# Patient Record
Sex: Female | Born: 1950
Health system: Southern US, Community
[De-identification: ages and names within clinical notes are randomized; demographics above are authoritative.]

## PROBLEM LIST (undated history)

## (undated) DIAGNOSIS — IMO0002 Reserved for concepts with insufficient information to code with codable children: Secondary | ICD-10-CM

## (undated) DIAGNOSIS — J449 Chronic obstructive pulmonary disease, unspecified: Secondary | ICD-10-CM

## (undated) DIAGNOSIS — R51 Headache: Secondary | ICD-10-CM

## (undated) DIAGNOSIS — Z8612 Personal history of poliomyelitis: Secondary | ICD-10-CM

## (undated) DIAGNOSIS — Z923 Personal history of irradiation: Secondary | ICD-10-CM

## (undated) DIAGNOSIS — M8580 Other specified disorders of bone density and structure, unspecified site: Secondary | ICD-10-CM

## (undated) DIAGNOSIS — M1990 Unspecified osteoarthritis, unspecified site: Secondary | ICD-10-CM

## (undated) DIAGNOSIS — F1090 Alcohol use, unspecified, uncomplicated: Secondary | ICD-10-CM

## (undated) DIAGNOSIS — C801 Malignant (primary) neoplasm, unspecified: Secondary | ICD-10-CM

## (undated) DIAGNOSIS — A809 Acute poliomyelitis, unspecified: Secondary | ICD-10-CM

## (undated) DIAGNOSIS — Z789 Other specified health status: Secondary | ICD-10-CM

## (undated) HISTORY — DX: Personal history of poliomyelitis: Z86.12

## (undated) HISTORY — PX: TONSILLECTOMY: SUR1361

## (undated) HISTORY — DX: Alcohol use, unspecified, uncomplicated: F10.90

## (undated) HISTORY — DX: Headache: R51

## (undated) HISTORY — DX: Other specified disorders of bone density and structure, unspecified site: M85.80

## (undated) HISTORY — DX: Acute poliomyelitis, unspecified: A80.9

## (undated) HISTORY — DX: Reserved for concepts with insufficient information to code with codable children: IMO0002

## (undated) HISTORY — DX: Unspecified osteoarthritis, unspecified site: M19.90

## (undated) HISTORY — PX: APPENDECTOMY: SHX54

## (undated) HISTORY — DX: Other specified health status: Z78.9

## (undated) HISTORY — DX: Chronic obstructive pulmonary disease, unspecified: J44.9

## (undated) HISTORY — PX: FOOT SURGERY: SHX648

## (undated) HISTORY — PX: OTHER SURGICAL HISTORY: SHX169

## (undated) HISTORY — PX: DILATION AND CURETTAGE OF UTERUS: SHX78

## (undated) HISTORY — PX: CATARACT EXTRACTION: SUR2

---

## 2001-07-30 ENCOUNTER — Encounter: Admission: RE | Admit: 2001-07-30 | Discharge: 2001-07-30 | Payer: Self-pay | Admitting: *Deleted

## 2002-05-31 ENCOUNTER — Encounter (INDEPENDENT_AMBULATORY_CARE_PROVIDER_SITE_OTHER): Payer: Self-pay | Admitting: *Deleted

## 2002-05-31 ENCOUNTER — Ambulatory Visit (HOSPITAL_COMMUNITY): Admission: RE | Admit: 2002-05-31 | Discharge: 2002-05-31 | Payer: Self-pay | Admitting: *Deleted

## 2002-05-31 LAB — HM COLONOSCOPY

## 2005-10-13 ENCOUNTER — Ambulatory Visit: Payer: Self-pay | Admitting: Family Medicine

## 2005-11-01 ENCOUNTER — Ambulatory Visit: Payer: Self-pay | Admitting: Family Medicine

## 2005-11-08 ENCOUNTER — Ambulatory Visit: Payer: Self-pay | Admitting: Family Medicine

## 2007-01-10 ENCOUNTER — Ambulatory Visit: Payer: Self-pay | Admitting: Family Medicine

## 2007-01-10 LAB — CONVERTED CEMR LAB
ALT: 15 units/L (ref 0–40)
AST: 20 units/L (ref 0–37)
BUN: 14 mg/dL (ref 6–23)
Basophils Absolute: 0 10*3/uL (ref 0.0–0.1)
Basophils Relative: 0.4 % (ref 0.0–1.0)
CO2: 29 meq/L (ref 19–32)
Calcium: 9.4 mg/dL (ref 8.4–10.5)
Chloride: 107 meq/L (ref 96–112)
Cholesterol: 200 mg/dL (ref 0–200)
Creatinine, Ser: 0.8 mg/dL (ref 0.4–1.2)
Direct LDL: 127 mg/dL
Eosinophils Absolute: 0.1 10*3/uL (ref 0.0–0.6)
Eosinophils Relative: 1.5 % (ref 0.0–5.0)
GFR calc Af Amer: 95 mL/min
GFR calc non Af Amer: 79 mL/min
Glucose, Bld: 88 mg/dL (ref 70–99)
HCT: 40.4 % (ref 36.0–46.0)
HDL: 43.5 mg/dL (ref >39.0)
Hemoglobin: 14.4 g/dL (ref 12.0–15.0)
Lymphocytes Relative: 35.9 % (ref 12.0–46.0)
MCHC: 35.6 g/dL (ref 30.0–36.0)
MCV: 98.6 fL (ref 78.0–100.0)
Monocytes Absolute: 0.6 10*3/uL (ref 0.2–0.7)
Monocytes Relative: 9.9 % (ref 3.0–11.0)
Neutro Abs: 3 10*3/uL (ref 1.4–7.7)
Neutrophils Relative %: 52.3 % (ref 43.0–77.0)
Platelets: 245 10*3/uL (ref 150–400)
Potassium: 5.3 meq/L — ABNORMAL HIGH (ref 3.5–5.1)
RBC: 4.1 M/uL (ref 3.87–5.11)
RDW: 12.5 % (ref 11.5–14.6)
Sodium: 141 meq/L (ref 135–145)
TSH: 0.63 microintl units/mL (ref 0.35–5.50)
Total CHOL/HDL Ratio: 4.6
Triglycerides: 209 mg/dL (ref 0–149)
VLDL: 42 mg/dL — ABNORMAL HIGH (ref 0–40)
WBC: 5.7 10*3/uL (ref 4.5–10.5)

## 2007-03-08 ENCOUNTER — Ambulatory Visit: Payer: Self-pay | Admitting: Internal Medicine

## 2007-07-25 DIAGNOSIS — R519 Headache, unspecified: Secondary | ICD-10-CM | POA: Insufficient documentation

## 2007-07-25 DIAGNOSIS — R51 Headache: Secondary | ICD-10-CM | POA: Insufficient documentation

## 2007-07-25 DIAGNOSIS — J449 Chronic obstructive pulmonary disease, unspecified: Secondary | ICD-10-CM | POA: Insufficient documentation

## 2007-07-25 HISTORY — DX: Chronic obstructive pulmonary disease, unspecified: J44.9

## 2007-09-20 ENCOUNTER — Telehealth: Payer: Self-pay | Admitting: Family Medicine

## 2007-09-26 ENCOUNTER — Ambulatory Visit: Payer: Self-pay | Admitting: Family Medicine

## 2007-10-09 ENCOUNTER — Ambulatory Visit: Payer: Self-pay | Admitting: Family Medicine

## 2007-10-09 LAB — CONVERTED CEMR LAB
Glucose, Urine, Semiquant: 100
Nitrite: NEGATIVE
Protein, U semiquant: NEGATIVE
Specific Gravity, Urine: 1.02
Urobilinogen, UA: 0.2
WBC Urine, dipstick: NEGATIVE
pH: 6.5

## 2007-10-16 ENCOUNTER — Ambulatory Visit: Payer: Self-pay | Admitting: Family Medicine

## 2007-10-16 DIAGNOSIS — F411 Generalized anxiety disorder: Secondary | ICD-10-CM

## 2007-10-16 DIAGNOSIS — M129 Arthropathy, unspecified: Secondary | ICD-10-CM | POA: Insufficient documentation

## 2007-10-16 HISTORY — DX: Generalized anxiety disorder: F41.1

## 2007-10-18 ENCOUNTER — Encounter: Admission: RE | Admit: 2007-10-18 | Discharge: 2007-10-18 | Payer: Self-pay | Admitting: Family Medicine

## 2007-10-19 ENCOUNTER — Encounter: Admission: RE | Admit: 2007-10-19 | Discharge: 2007-10-19 | Payer: Self-pay | Admitting: Family Medicine

## 2007-10-19 ENCOUNTER — Encounter: Payer: Self-pay | Admitting: Family Medicine

## 2007-10-25 ENCOUNTER — Encounter: Payer: Self-pay | Admitting: Family Medicine

## 2007-10-25 LAB — CONVERTED CEMR LAB
ALT: 15 units/L (ref 0–35)
AST: 22 units/L (ref 0–37)
Albumin: 4.3 g/dL (ref 3.5–5.2)
Alkaline Phosphatase: 74 units/L (ref 39–117)
BUN: 12 mg/dL (ref 6–23)
Basophils Absolute: 0 10*3/uL (ref 0.0–0.1)
Basophils Relative: 0.5 % (ref 0.0–1.0)
Bilirubin, Direct: 0.2 mg/dL (ref 0.0–0.3)
CO2: 26 meq/L (ref 19–32)
Calcium: 9.2 mg/dL (ref 8.4–10.5)
Chloride: 106 meq/L (ref 96–112)
Cholesterol: 215 mg/dL (ref 0–200)
Creatinine, Ser: 0.7 mg/dL (ref 0.4–1.2)
Direct LDL: 145.1 mg/dL
Eosinophils Absolute: 0.1 10*3/uL (ref 0.0–0.6)
Eosinophils Relative: 1.1 % (ref 0.0–5.0)
GFR calc Af Amer: 111 mL/min
GFR calc non Af Amer: 92 mL/min
Glucose, Bld: 80 mg/dL (ref 70–99)
HCT: 39.5 % (ref 36.0–46.0)
HDL: 46.5 mg/dL (ref >39.0)
Hemoglobin: 14.1 g/dL (ref 12.0–15.0)
Lymphocytes Relative: 30 % (ref 12.0–46.0)
MCHC: 35.6 g/dL (ref 30.0–36.0)
MCV: 98.8 fL (ref 78.0–100.0)
Monocytes Absolute: 0.5 10*3/uL (ref 0.2–0.7)
Monocytes Relative: 7.7 % (ref 3.0–11.0)
Neutro Abs: 3.9 10*3/uL (ref 1.4–7.7)
Neutrophils Relative %: 60.7 % (ref 43.0–77.0)
Platelets: 269 10*3/uL (ref 150–400)
Potassium: 4.1 meq/L (ref 3.5–5.1)
RBC: 4 M/uL (ref 3.87–5.11)
RDW: 12.9 % (ref 11.5–14.6)
Sodium: 142 meq/L (ref 135–145)
TSH: 0.61 microintl units/mL (ref 0.35–5.50)
Total Bilirubin: 1.1 mg/dL (ref 0.3–1.2)
Total CHOL/HDL Ratio: 4.6
Total Protein: 6.6 g/dL (ref 6.0–8.3)
Triglycerides: 102 mg/dL (ref 0–149)
VLDL: 20 mg/dL (ref 0–40)
WBC: 6.4 10*3/uL (ref 4.5–10.5)

## 2007-10-30 ENCOUNTER — Ambulatory Visit: Payer: Self-pay | Admitting: Family Medicine

## 2008-02-26 ENCOUNTER — Ambulatory Visit: Payer: Self-pay | Admitting: Family Medicine

## 2008-03-27 ENCOUNTER — Telehealth: Payer: Self-pay | Admitting: Family Medicine

## 2008-06-10 ENCOUNTER — Telehealth: Payer: Self-pay | Admitting: Family Medicine

## 2008-06-11 ENCOUNTER — Ambulatory Visit: Payer: Self-pay | Admitting: Family Medicine

## 2008-06-11 ENCOUNTER — Telehealth: Payer: Self-pay | Admitting: Family Medicine

## 2008-07-14 ENCOUNTER — Telehealth: Payer: Self-pay | Admitting: Family Medicine

## 2008-07-15 ENCOUNTER — Ambulatory Visit: Payer: Self-pay | Admitting: Family Medicine

## 2008-07-15 DIAGNOSIS — K5732 Diverticulitis of large intestine without perforation or abscess without bleeding: Secondary | ICD-10-CM

## 2008-07-15 HISTORY — DX: Diverticulitis of large intestine without perforation or abscess without bleeding: K57.32

## 2008-07-15 LAB — CONVERTED CEMR LAB
Bilirubin Urine: NEGATIVE
Glucose, Urine, Semiquant: NEGATIVE
Ketones, urine, test strip: NEGATIVE
Nitrite: NEGATIVE
Protein, U semiquant: NEGATIVE
Specific Gravity, Urine: <1.005
Urobilinogen, UA: 0.2
WBC Urine, dipstick: NEGATIVE
pH: 6.5

## 2008-09-16 ENCOUNTER — Ambulatory Visit: Payer: Self-pay | Admitting: Family Medicine

## 2008-09-24 ENCOUNTER — Telehealth: Payer: Self-pay | Admitting: Family Medicine

## 2008-10-08 ENCOUNTER — Telehealth: Payer: Self-pay | Admitting: Family Medicine

## 2008-12-09 ENCOUNTER — Telehealth: Payer: Self-pay | Admitting: Family Medicine

## 2008-12-10 ENCOUNTER — Ambulatory Visit: Payer: Self-pay | Admitting: Family Medicine

## 2008-12-10 DIAGNOSIS — M1990 Unspecified osteoarthritis, unspecified site: Secondary | ICD-10-CM

## 2008-12-10 DIAGNOSIS — J309 Allergic rhinitis, unspecified: Secondary | ICD-10-CM

## 2008-12-10 DIAGNOSIS — M949 Disorder of cartilage, unspecified: Secondary | ICD-10-CM

## 2008-12-10 DIAGNOSIS — M899 Disorder of bone, unspecified: Secondary | ICD-10-CM | POA: Insufficient documentation

## 2008-12-10 HISTORY — DX: Allergic rhinitis, unspecified: J30.9

## 2008-12-10 HISTORY — DX: Unspecified osteoarthritis, unspecified site: M19.90

## 2009-03-10 ENCOUNTER — Telehealth: Payer: Self-pay | Admitting: Family Medicine

## 2009-04-07 ENCOUNTER — Telehealth: Payer: Self-pay | Admitting: Family Medicine

## 2009-06-05 ENCOUNTER — Telehealth: Payer: Self-pay | Admitting: Family Medicine

## 2009-06-10 ENCOUNTER — Ambulatory Visit: Payer: Self-pay | Admitting: Family Medicine

## 2009-06-10 DIAGNOSIS — M19079 Primary osteoarthritis, unspecified ankle and foot: Secondary | ICD-10-CM | POA: Insufficient documentation

## 2009-06-10 HISTORY — DX: Primary osteoarthritis, unspecified ankle and foot: M19.079

## 2009-08-06 ENCOUNTER — Telehealth: Payer: Self-pay | Admitting: Family Medicine

## 2009-08-06 ENCOUNTER — Telehealth: Payer: Self-pay | Admitting: *Deleted

## 2009-09-02 ENCOUNTER — Ambulatory Visit: Payer: Self-pay | Admitting: Family Medicine

## 2009-10-13 ENCOUNTER — Telehealth: Payer: Self-pay | Admitting: Family Medicine

## 2009-10-27 ENCOUNTER — Telehealth: Payer: Self-pay | Admitting: Family Medicine

## 2010-03-02 ENCOUNTER — Ambulatory Visit: Payer: Self-pay | Admitting: Family Medicine

## 2010-03-09 ENCOUNTER — Ambulatory Visit: Payer: Self-pay | Admitting: Family Medicine

## 2010-03-09 DIAGNOSIS — K589 Irritable bowel syndrome without diarrhea: Secondary | ICD-10-CM

## 2010-03-09 HISTORY — DX: Irritable bowel syndrome, unspecified: K58.9

## 2010-03-09 LAB — CONVERTED CEMR LAB
ALT: 15 units/L (ref 0–35)
AST: 21 units/L (ref 0–37)
Albumin: 4.1 g/dL (ref 3.5–5.2)
Alkaline Phosphatase: 58 units/L (ref 39–117)
BUN: 14 mg/dL (ref 6–23)
Basophils Absolute: 0 10*3/uL (ref 0.0–0.1)
Basophils Relative: 0.3 % (ref 0.0–3.0)
Bilirubin Urine: NEGATIVE
Bilirubin, Direct: 0 mg/dL (ref 0.0–0.3)
Blood in Urine, dipstick: NEGATIVE
CO2: 29 meq/L (ref 19–32)
Calcium: 9.5 mg/dL (ref 8.4–10.5)
Chloride: 107 meq/L (ref 96–112)
Cholesterol: 209 mg/dL — ABNORMAL HIGH (ref 0–200)
Creatinine, Ser: 0.7 mg/dL (ref 0.4–1.2)
Direct LDL: 151.4 mg/dL
Eosinophils Absolute: 0.1 10*3/uL (ref 0.0–0.7)
Eosinophils Relative: 1.5 % (ref 0.0–5.0)
GFR calc non Af Amer: 90.84 mL/min (ref >60)
Glucose, Bld: 85 mg/dL (ref 70–99)
Glucose, Urine, Semiquant: NEGATIVE
HCT: 41.3 % (ref 36.0–46.0)
HDL: 42.6 mg/dL (ref >39.00)
Hemoglobin: 14.2 g/dL (ref 12.0–15.0)
Ketones, urine, test strip: NEGATIVE
Lymphocytes Relative: 36 % (ref 12.0–46.0)
Lymphs Abs: 1.8 10*3/uL (ref 0.7–4.0)
MCHC: 34.2 g/dL (ref 30.0–36.0)
MCV: 101.7 fL — ABNORMAL HIGH (ref 78.0–100.0)
Monocytes Absolute: 0.4 10*3/uL (ref 0.1–1.0)
Monocytes Relative: 7.6 % (ref 3.0–12.0)
Neutro Abs: 2.7 10*3/uL (ref 1.4–7.7)
Neutrophils Relative %: 54.6 % (ref 43.0–77.0)
Nitrite: NEGATIVE
Platelets: 251 10*3/uL (ref 150.0–400.0)
Potassium: 4.3 meq/L (ref 3.5–5.1)
Protein, U semiquant: NEGATIVE
RBC: 4.06 M/uL (ref 3.87–5.11)
RDW: 12.7 % (ref 11.5–14.6)
Sodium: 145 meq/L (ref 135–145)
Specific Gravity, Urine: 1.02
TSH: 0.71 microintl units/mL (ref 0.35–5.50)
Total Bilirubin: 0.7 mg/dL (ref 0.3–1.2)
Total CHOL/HDL Ratio: 5
Total Protein: 7 g/dL (ref 6.0–8.3)
Triglycerides: 119 mg/dL (ref 0.0–149.0)
Urobilinogen, UA: 0.2
VLDL: 23.8 mg/dL (ref 0.0–40.0)
WBC: 5 10*3/uL (ref 4.5–10.5)
pH: 5.5

## 2010-03-17 ENCOUNTER — Telehealth: Payer: Self-pay | Admitting: Family Medicine

## 2010-03-17 ENCOUNTER — Ambulatory Visit: Payer: Self-pay | Admitting: Family Medicine

## 2010-03-24 LAB — CONVERTED CEMR LAB: OCCULT 2: NEGATIVE

## 2010-04-16 ENCOUNTER — Ambulatory Visit: Payer: Self-pay | Admitting: Gastroenterology

## 2010-04-16 ENCOUNTER — Encounter (INDEPENDENT_AMBULATORY_CARE_PROVIDER_SITE_OTHER): Payer: Self-pay | Admitting: *Deleted

## 2010-04-16 DIAGNOSIS — R1032 Left lower quadrant pain: Secondary | ICD-10-CM | POA: Insufficient documentation

## 2010-06-29 ENCOUNTER — Ambulatory Visit: Payer: Self-pay | Admitting: Family Medicine

## 2010-06-29 DIAGNOSIS — Z8612 Personal history of poliomyelitis: Secondary | ICD-10-CM

## 2010-06-29 DIAGNOSIS — R059 Cough, unspecified: Secondary | ICD-10-CM | POA: Insufficient documentation

## 2010-06-29 DIAGNOSIS — I10 Essential (primary) hypertension: Secondary | ICD-10-CM | POA: Insufficient documentation

## 2010-06-29 DIAGNOSIS — M62838 Other muscle spasm: Secondary | ICD-10-CM | POA: Insufficient documentation

## 2010-06-29 DIAGNOSIS — R05 Cough: Secondary | ICD-10-CM

## 2010-06-29 DIAGNOSIS — F17200 Nicotine dependence, unspecified, uncomplicated: Secondary | ICD-10-CM

## 2010-06-29 HISTORY — DX: Other muscle spasm: M62.838

## 2010-06-29 HISTORY — DX: Nicotine dependence, unspecified, uncomplicated: F17.200

## 2010-09-20 ENCOUNTER — Telehealth: Payer: Self-pay | Admitting: Family Medicine

## 2010-09-21 ENCOUNTER — Telehealth: Payer: Self-pay | Admitting: Family Medicine

## 2010-09-30 ENCOUNTER — Ambulatory Visit: Payer: Self-pay | Admitting: Family Medicine

## 2010-11-16 ENCOUNTER — Telehealth: Payer: Self-pay | Admitting: Family Medicine

## 2010-12-05 HISTORY — PX: RETINAL DETACHMENT SURGERY: SHX105

## 2011-01-06 NOTE — Procedures (Signed)
Summary: Colon   Colonoscopy  Procedure date:  05/31/2002  Findings:      Location:  [facility].   [facility]  Patient:    Lindsey Rowland, ZURCHER Visit Number: [id number] MRN: [id number]          Service Type: END Location: ENDO Attending Physician:  Sabino Gasser Dictated by:   Sabino Gasser, M.D. Proc. Date: 05/31/02 Admit Date:  05/31/2002   CC:         Tinnie Gens C. Quintella Reichert, M.D.   Procedure Report  PROCEDURE:  Colonoscopy.  INDICATIONS:  Colon cancer screening.  Rectal bleeding attributed to hemorrhoids.  ANESTHESIA:  Demerol 20, Versed 2 mg.  DESCRIPTION OF PROCEDURE:  With the patient mildly sedated in the left lateral decubitus position, a rectal examination was performed.  Subsequently, the Olympus videoscopic colonoscope was inserted into the rectum and passed under direct vision to the cecum, identified by the ileocecal valve and appendiceal orifice, both of which were photographed.  From this point, the colonoscope was slowly withdrawn, taking circumferential views of the entire colonic mucosa, stopping in the rectum which appeared normal on direct and showed hemorrhoids on retroflexed view.  The endoscope was straightened and withdrawn.  The patients vital signs and pulse oximeter remained stable.  The patient tolerated the procedure well without apparent complications.  FINDINGS:  Internal hemorrhoids, otherwise unremarkable colonoscopic examination to the cecum.  PLAN:  Will have patient follow up with me as an outpatient. Dictated by:   Sabino Gasser, M.D. Attending Physician:  Sabino Gasser DD:  05/31/02 TD:  06/01/02 Job: [id number]

## 2011-01-06 NOTE — Progress Notes (Signed)
Summary: colon & endo  Phone Note Call from Patient Call back at [phone]   Caller: vm Summary of Call: Info re last test colon & endo that Dr. Scotty Court wanted.  [facility].   Called [facility].  Neither had record of colon.  Another vm call.  Raelene Bott Spell, RN  March 17, 2010 4:25 PM She knows it'b been 8-10 years since last tests and she needs to get them scheduled.  Left side gas & pain.  Painful today on the med that Dr. Satira Sark gave her.  Last time she also had endo for acid reflux, taking Protonix for it now, so doing good now.    Initial call taken by: Rudy Jew, RN,  March 17, 2010 3:20 PM  Follow-up for Phone Call        arrange an appointment with gastroenterologist

## 2011-01-06 NOTE — Assessment & Plan Note (Signed)
Summary: cpx//ccm   Vital Signs:  Patient profile:   60 year old female Height:      65 inches Weight:      130 pounds BMI:     21.71 O2 Sat:      97 % Temp:     98.5 degrees F Pulse rate:   94 / minute BP sitting:   124 / 76  (left arm)  Vitals Entered By: Pura Spice, RN (March 09, 2010 2:51 PM) CC: cpx GYN Dr Primitivo Gauze  Is Patient Diabetic? No   History of Present Illness: This 60 year old white married female is in for complete physical examination, no Pap smear patient has gynecologist Dr. Primitivo Gauze to his lower knee down a mammogram Pap smear and bone density Her complaints today are that she is tired with no energy and hasn't been for some time She complains of pain in the left lower quadrant with increased gas and bloating has bouts of diarrhea no constipation and no bleeding Has not had a colonoscopic examination in 8-10 years and I will definitely schedule this for this patient. She has past history of GERD which has been well-controlled with proton at 40 mg each day She has chronic anxiety and stress control with diazepam 10 for anxiety. Patient had back injuries in the past and needs hydrocodone since Celebrex 200 mg b.i.d. does not control her back pain and arthritis Her COPD is controlled with Combivent  She is on Boniva calcium and vitamin D for osteopenia had cataract removal right eye continues to have some problem with allergic rhinitis taken antihistamines  Preventive Screening-Counseling & Management  Alcohol-Tobacco     Smoking Status: current     Packs/Day: 0.5     Year Started: 2006  Allergies (verified): No Known Drug Allergies  Past History:  Past Medical History: Last updated: 07/25/2007 Arthritis Migraine Hx of polio COPD Headache Bronchitis Ulcers  Past Surgical History: Last updated: 07/25/2007 T/A Appendectomy Tonsillectomy  Social History: Last updated: 07/25/2007 Occupation: Chartered loss adjuster Married Current Smoker Alcohol  use-yes Drug use-no Regular exercise-no  Risk Factors: Smoking Status: current (03/09/2010) Packs/Day: 0.5 (03/09/2010)  Social History: Packs/Day:  0.5  Review of Systems      See HPI  The patient denies anorexia, fever, weight loss, weight gain, vision loss, decreased hearing, hoarseness, chest pain, syncope, dyspnea on exertion, peripheral edema, prolonged cough, headaches, hemoptysis, abdominal pain, melena, hematochezia, severe indigestion/heartburn, hematuria, incontinence, genital sores, muscle weakness, suspicious skin lesions, transient blindness, difficulty walking, depression, unusual weight change, abnormal bleeding, enlarged lymph nodes, angioedema, breast masses, and testicular masses.    Physical Exam  General:  Well-developed,well-nourished,in no acute distress; alert,appropriate and cooperative throughout examination Head:  Normocephalic and atraumatic without obvious abnormalities. No apparent alopecia or balding. Eyes:  No corneal or conjunctival inflammation noted. EOMI. Perrla. Funduscopic exam benign, without hemorrhages, exudates or papilledema. Vision grossly normal. Ears:  External ear exam shows no significant lesions or deformities.  Otoscopic examination reveals clear canals, tympanic membranes are intact bilaterally without bulging, retraction, inflammation or discharge. Hearing is grossly normal bilaterally. Nose:  nasal mucosa boggy and pale with minimal clear drainage Mouth:  Oral mucosa and oropharynx without lesions or exudates.  Teeth in good repair. Neck:  No deformities, masses, or tenderness noted. Chest Wall:  No deformities, masses, or tenderness noted. Breasts:  No mass, nodules, thickening, tenderness, bulging, retraction, inflamation, nipple discharge or skin changes noted.   Lungs:  Normal respiratory effort, chest expands symmetrically. Lungs are clear  to auscultation, no crackles or wheezes. Heart:  Normal rate and regular rhythm. S1 and S2  normal without gallop, murmur, click, rub or other extra sounds. Abdomen:  Bowel sounds positive,abdomen soft and non-tender without masses, organomegaly or hernias noted. Rectal:  not examined Genitalia:  not examined Msk:  No deformity or scoliosis noted of thoracic or lumbar spine.   Pulses:  R and L carotid,radial,femoral,dorsalis pedis and posterior tibial pulses are full and equal bilaterally Extremities:  No clubbing, cyanosis, edema, or deformity noted with normal full range of motion of all joints.   Neurologic:  No cranial nerve deficits noted. Station and gait are normal. Plantar reflexes are down-going bilaterally. DTRs are symmetrical throughout. Sensory, motor and coordinative functions appear intact. Skin:  Intact without suspicious lesions or rashes Cervical Nodes:  No lymphadenopathy noted Axillary Nodes:  No palpable lymphadenopathy Inguinal Nodes:  No significant adenopathy Psych:  Cognition and judgment appear intact. Alert and cooperative with normal attention span and concentration. No apparent delusions, illusions, hallucinations   Impression & Recommendations:  Problem # 1:  PHYSICAL EXAMINATION (ICD-V70.0) Assessment New  Problem # 2:  IRRITABLE BOWEL SYNDROME (ICD-564.1) Assessment: New Hyosyamine ,375 mg two times a day 2 schedule colonoscopic exam  Problem # 3:  ARTHRITIS, ANKLE (ICD-716.97) Assessment: Unchanged  Problem # 4:  OSTEOPENIA (ICD-733.90) Assessment: Unchanged  Her updated medication list for this problem includes:    Boniva 150 Mg Tabs (Ibandronate sodium) .Marland Kitchen... Take 1 tablet monthly  Problem # 5:  ALLERGIC RHINITIS (ICD-477.9) Assessment: Unchanged  Her updated medication list for this problem includes:    Omnaris 50 Mcg/act Susp (Ciclesonide) .Marland Kitchen... 2 sprays each nostril qd  Problem # 6:  ANXIETY STATE, UNSPECIFIED (ICD-300.00) Assessment: Unchanged  The following medications were removed from the medication list:    Hydroxyzine  Hcl 25 Mg Tabs (Hydroxyzine hcl) .Marland Kitchen... 1 three times a day to prevent itching Her updated medication list for this problem includes:    Diazepam 10 Mg Tabs (Diazepam) .Marland Kitchen... 1  three times a day as needed for stress  Problem # 7:  ARTHRITIS (ICD-716.90) Assessment: Unchanged  Problem # 8:  COPD (ICD-496) Assessment: Unchanged  Her updated medication list for this problem includes:    Combivent 103-18 Mcg/act Aero (Albuterol-ipratropium) .Marland Kitchen... 2 inhalation as needed for wheezing    Proair Hfa 108 (90 Base) Mcg/act Aers (Albuterol sulfate) .Marland Kitchen... 2 inhalations three times a day as needed wheezing  Complete Medication List: 1)  Protonix 40 Mg Tbec (Pantoprazole sodium) .... Once daily 2)  Diazepam 10 Mg Tabs (Diazepam) .Marland Kitchen.. 1  three times a day as needed for stress 3)  Hydrocodone-acetaminophen 10-500 Mg Tabs (Hydrocodone-acetaminophen) .... Take 1 every 4-6 hrs as needed pain. not to exceed 4 per day 4)  Combivent 103-18 Mcg/act Aero (Albuterol-ipratropium) .... 2 inhalation as needed for wheezing 5)  Diphenoxylate-atropine 2.5-0.025 Mg Tabs (Diphenoxylate-atropine) .... Take 2 tablets stat then  2 tablets qid for diarrhea 6)  Boniva 150 Mg Tabs (Ibandronate sodium) .... Take 1 tablet monthly 7)  Proair Hfa 108 (90 Base) Mcg/act Aers (Albuterol sulfate) .... 2 inhalations three times a day as needed wheezing 8)  Meclizine Hcl 25 Mg Tabs (Meclizine hcl) .Marland Kitchen.. 1 stat then morn,midafternoon and hs to prevent dizziness 9)  Omnaris 50 Mcg/act Susp (Ciclesonide) .... 2 sprays each nostril qd 10)  Mucinex D [phone] Mg Xr12h-tab (Pseudoephedrine-guaifenesin) .Marland Kitchen.. 1 two times a day 11)  Celebrex 200 Mg Caps (Celecoxib) .Marland Kitchen.. 1 by mouth two times a  day after meals  needs office visit 12)  Hyoscyamine Sulfate Cr 0.375 Mg Xr12h-tab (Hyoscyamine sulfate) .Marland Kitchen.. 1 each morning for irritable bowel, may increase to 1 two times a day if needed  Other Orders: EKG w/ Interpretation (93000) Gastroenterology  Referral (GI)  Patient Instructions: 1)  Take medications as prescribedpreviously 2)  We'll schedule colonoscopic exam in the very near future Prescriptions: HYDROCODONE-ACETAMINOPHEN 10-500 MG  TABS (HYDROCODONE-ACETAMINOPHEN) take 1 every 4-6 hrs as needed pain. not to exceed 4 per day  #120 x 5   Entered and Authorized by:   Judithann Sheen MD   Signed by:   Judithann Sheen MD on 03/09/2010   Method used:   Print then Give to Patient   RxID:   [id number] DIAZEPAM 10 MG  TABS (DIAZEPAM) 1  three times a day as needed for stress  #90 x 5   Entered and Authorized by:   Judithann Sheen MD   Signed by:   Judithann Sheen MD on 03/09/2010   Method used:   Print then Give to Patient   RxID:   [id number] CELEBREX 200 MG CAPS (CELECOXIB) 1 by mouth two times a day after meals  needs office visit  #60 x 11   Entered and Authorized by:   Judithann Sheen MD   Signed by:   Judithann Sheen MD on 03/09/2010   Method used:   [facility]        [facility] [address] [phone][facility] (retail)       [address]       [place], [place]  [address]       Ph: [phone] or [phone]       Fax: [phone]   RxID:   [id number] MECLIZINE HCL 25 MG TABS (MECLIZINE HCL) 1 stat then morn,midafternoon and hs to prevent dizziness  #90 x 11   Entered and Authorized by:   Judithann Sheen MD   Signed by:   Judithann Sheen MD on 03/09/2010   Method used:   [facility]        [facility]  [address] [address] [phone]* (retail)       [address]       [place], [place]  [address]       Ph: [phone] or [phone]       Fax: [phone]   RxID:   [id number] COMBIVENT 103-18 MCG/ACT  AERO (ALBUTEROL-IPRATROPIUM) 2 inhalation as needed for wheezing  #1 x 11   Entered and Authorized by:   Judithann Sheen MD   Signed by:   Judithann Sheen MD on 03/09/2010   Method used:   [facility]        [facility]  [address]  [phone]* (retail)       [address]       [place], [place]  [address]       Ph: [phone] or [phone]       Fax: [phone]   RxID:   [id number] PROTONIX 40 MG TBEC (PANTOPRAZOLE SODIUM) once daily Brand medically necessary #30 x 11   Entered and Authorized by:   Judithann Sheen MD   Signed by:   Judithann Sheen MD on 03/09/2010   Method used:  [facility]        [facility]  [address] [phone]* (retail)       [address]       [place], [place]  [address]       Ph: [phone] or [phone]       Fax: [phone]   RxID:   [id number] HYOSCYAMINE SULFATE CR 0.375 MG XR12H-TAB (HYOSCYAMINE SULFATE) 1 each morning for irritable bowel, may increase to 1 two times a day if needed  #30 x 11   Entered and Authorized by:   Judithann Sheen MD   Signed by:   Judithann Sheen MD on 03/09/2010   Method used:   [facility]        [facility]  [address] [phone]* (retail)       [address]       [place], [place]  [address]       Ph: [phone] or [phone]       Fax: [phone]   RxID:   [id number]

## 2011-01-06 NOTE — Assessment & Plan Note (Signed)
History of Present Illness Visit Type: Initial Consult Primary GI MD: Rob Bunting MD Primary Provider: London Sheer Requesting Provider: London Sheer Chief Complaint: Lower abdominal Pain History of Present Illness:     very pleasant 60 year old woman who has had left sided abd pains.  These have been going on for several months.  Eating makes her bloated and this causes worseing symptoms. She is sore on left side. this is intermittent.  3 weeks ago she was very distended. She's been belching a lot lately. feels a lot of churning. she can push on her left side when she has this feeling and then often passes gas. Moving her bowels usually results are discomforts.  She had recent CBC, complete metabolic profile and these were normal.  She is a bit more consipated lately.  Has had to push, strain a bit.  She takes hydrocodone 3-4 times a week.  Never black colored, never overt red blood.  She had  colonosopy many years ago (7-10 years ago, didn't find anything).           Current Medications (verified): 1)  Protonix 40 Mg Tbec (Pantoprazole Sodium) .... Once Daily 2)  Diazepam 10 Mg  Tabs (Diazepam) .Marland Kitchen.. 1  Three Times A Day As Needed For Stress 3)  Hydrocodone-Acetaminophen 10-500 Mg  Tabs (Hydrocodone-Acetaminophen) .... Take 1 Every 4-6 Hrs As Needed Pain. Not To Exceed 4 Per Day 4)  Combivent 103-18 Mcg/act  Aero (Albuterol-Ipratropium) .... 2 Inhalation As Needed For Wheezing 5)  Diphenoxylate-Atropine 2.5-0.025 Mg  Tabs (Diphenoxylate-Atropine) .... Take 2 Tablets Stat Then  2 Tablets Qid For Diarrhea 6)  Boniva 150 Mg  Tabs (Ibandronate Sodium) .... Take 1 Tablet Monthly 7)  Proair Hfa 108 (90 Base) Mcg/act  Aers (Albuterol Sulfate) .... 2 Inhalations Three Times A Day As Needed Wheezing 8)  Meclizine Hcl 25 Mg Tabs (Meclizine Hcl) .Marland Kitchen.. 1 Stat Then Morn,midafternoon and Hs To Prevent Dizziness 9)  Omnaris 50 Mcg/act Susp (Ciclesonide) .... 2 Sprays Each  Nostril Qd 10)  Mucinex D [phone] Mg Xr12h-Tab (Pseudoephedrine-Guaifenesin) .Marland Kitchen.. 1 Two Times A Day 11)  Celebrex 200 Mg Caps (Celecoxib) .Marland Kitchen.. 1 By Mouth Two Times A Day After Meals  Needs Office Visit 12)  Hyoscyamine Sulfate Cr 0.375 Mg Xr12h-Tab (Hyoscyamine Sulfate) .Marland Kitchen.. 1 Each Morning For Irritable Bowel, May Increase To 1 Two Times A Day If Needed  Allergies (verified): No Known Drug Allergies  Past History:  Past Medical History: Arthritis Migraine Hx of polio COPD Headache Bronchitis Ulcers ruptured appendicitis adhesive disease   Past Surgical History: T/A Appendectomy 2003 Tonsillectomy  Family History: no colon cancer  Social History: Occupation: Chartered loss adjuster Married Current Smoker Alcohol use-yes Drug use-no Regular exercise-no   Review of Systems       Pertinent positive and negative review of systems were noted in the above HPI and GI specific review of systems.  All other review of systems was otherwise negative.   Vital Signs:  Patient profile:   60 year old female Height:      65 inches Weight:      126 pounds BMI:     21.04 BSA:     1.63 Pulse rate:   100 / minute Pulse rhythm:   regular BP sitting:   128 / 82  (left arm)  Vitals Entered By: Merri Ray CMA Duncan Dull) (Apr 16, 2010 8:58 AM)  Physical Exam  Additional Exam:  Constitutional: generally well appearing Psychiatric: alert and oriented times 3 Eyes: extraocular movements  intact Mouth: oropharynx moist, no lesions Neck: supple, no lymphadenopathy Cardiovascular: heart regular rate and rythm Lungs: CTA bilaterally Abdomen: soft, non-tender, non-distended, no obvious ascites, no peritoneal signs, normal bowel sounds Extremities: no lower extremity edema bilaterally Skin: no lesions on visible extremities    Impression & Recommendations:  Problem # 1:  Left lower quadrant discomforts she has been a bit more constipated lately. Perhaps her constipation is causing some  gas trapping, bloating sensation. I think we should proceed with full colonoscopy to rule out neoplastic process which I doubt. In the meantime, she will start fiber supplements on a daily basis to see if we can even out her bowel habits, hopefully this in turn will resolve some of her bloating, left-sided discomforts.  Patient Instructions: 1)  You will be scheduled to have a colonoscopy in 4 weeks. 2)  You should begin taking citrucel powder fiber supplement (orange flavor).  Start with a small spoonful and increase this over 1 week to a full, heaping spoonful daily.  You may notice some bloating when you first start the fiber, but that usually resolves after a few days. 3)  A copy of this information will be sent to Dr. Scotty Court. 4)  The medication list was reviewed and reconciled.  All changed / newly prescribed medications were explained.  A complete medication list was provided to the patient / caregiver.  Appended Document: Orders Update/movi    Clinical Lists Changes  Problems: Added new problem of ABDOMINAL PAIN, LEFT LOWER QUADRANT (ICD-789.04) Medications: Added new medication of MOVIPREP 100 GM  SOLR (PEG-KCL-NACL-NASULF-NA ASC-C) As per prep instructions. - Signed Rx of MOVIPREP 100 GM  SOLR (PEG-KCL-NACL-NASULF-NA ASC-C) As per prep instructions.;  #1 x 0;  Signed;  Entered by: Chales Abrahams CMA (AAMA);  Authorized by: Rachael Fee MD;  Method used: [facility]  [address] [phone]*, [address], [place], [place], [place]  [address], Ph: [phone] or [phone], Fax: [phone] Orders: Added new Test order of Colonoscopy (Colon) - Signed    Prescriptions: MOVIPREP 100 GM  SOLR (PEG-KCL-NACL-NASULF-NA ASC-C) As per prep instructions.  #1 x 0   Entered by:   Chales Abrahams CMA (AAMA)   Authorized by:   Rachael Fee MD   Signed by:   Chales Abrahams CMA (AAMA) on 04/16/2010   Method used:   [facility]ron[facility]        [facility]  [address] [phone]* (retail)       [address]       [place], [place]  [address]       Ph: [phone] or [phone]       Fax: [phone]   RxID:   [id number]

## 2011-01-06 NOTE — Letter (Signed)
Summary: [facility] Instructions  [facility]  [address] [place], [place] [address]   Phone: [phone]  Fax: [phone]       Lindsey Rowland    Dec 05, 1951    MRN: [id number]        Procedure Day /Date:05/19/10     Arrival Time:1 pm     Procedure Time:2 pm     Location of Procedure:                    X  [facility] (4th Floor)                        PREPARATION FOR COLONOSCOPY WITH MOVIPREP   Starting 5 days prior to your procedure 05/14/10 do not eat nuts, seeds, popcorn, corn, beans, peas,  salads, or any raw vegetables.  Do not take any fiber supplements (e.g. Metamucil, Citrucel, and Benefiber).  THE DAY BEFORE YOUR PROCEDURE         DATE: 05/18/10  DAY: TUE  1.  Drink clear liquids the entire day-NO SOLID FOOD  2.  Do not drink anything colored red or purple.  Avoid juices with pulp.  No orange juice.  3.  Drink at least 64 oz. (8 glasses) of fluid/clear liquids during the day to prevent dehydration and help the prep work efficiently.  CLEAR LIQUIDS INCLUDE: Water Jello Ice Popsicles Tea (sugar ok, no milk/cream) Powdered fruit flavored drinks Coffee (sugar ok, no milk/cream) Gatorade Juice: apple, white grape, white cranberry  Lemonade Clear bullion, consomm, broth Carbonated beverages (any kind) Strained chicken noodle soup Hard Candy                             4.  In the morning, mix first dose of MoviPrep solution:    Empty 1 Pouch A and 1 Pouch B into the disposable container    Add lukewarm drinking water to the top line of the container. Mix to dissolve    Refrigerate (mixed solution should be used within 24 hrs)  5.  Begin drinking the prep at 5:00 p.m. The MoviPrep container is divided by 4 marks.   Every 15 minutes drink the solution down to the next mark (approximately 8 oz) until the full liter is complete.   6.  Follow completed prep with 16 oz of clear liquid of your choice (Nothing red or purple).   Continue to drink clear liquids until bedtime.  7.  Before going to bed, mix second dose of MoviPrep solution:    Empty 1 Pouch A and 1 Pouch B into the disposable container    Add lukewarm drinking water to the top line of the container. Mix to dissolve    Refrigerate  THE DAY OF YOUR PROCEDURE      DATE: 05/19/10 DAY: WED  Beginning at 9 a.m. (5 hours before procedure):         1. Every 15 minutes, drink the solution down to the next mark (approx 8 oz) until the full liter is complete.  2. Follow completed prep with 16 oz. of clear liquid of your choice.    3. You may drink clear liquids until 12 noon (2 HOURS BEFORE PROCEDURE).   MEDICATION INSTRUCTIONS  Unless otherwise instructed, you should take regular prescription medications with a small sip of water   as early as possible the morning of your procedure.  OTHER INSTRUCTIONS  You will need a responsible adult at least 61 years of age to accompany you and drive you home.   This person must remain in the waiting room during your procedure.  Wear loose fitting clothing that is easily removed.  Leave jewelry and other valuables at home.  However, you may wish to bring a book to read or  an iPod/MP3 player to listen to music as you wait for your procedure to start.  Remove all body piercing jewelry and leave at home.  Total time from sign-in until discharge is approximately 2-3 hours.  You should go home directly after your procedure and rest.  You can resume normal activities the  day after your procedure.  The day of your procedure you should not:   Drive   Make legal decisions   Operate machinery   Drink alcohol   Return to work  You will receive specific instructions about eating, activities and medications before you leave.    The above instructions have been reviewed and explained to me by   _______________________    I fully understand and can verbalize these instructions  _____________________________ Date _________

## 2011-01-06 NOTE — Assessment & Plan Note (Signed)
Summary: flu shot/cjr  Nurse Visit   Allergies: No Known Drug Allergies  Review of Systems       Flu Vaccine Consent Questions     Do you have a history of severe allergic reactions to this vaccine? no    Any prior history of allergic reactions to egg and/or gelatin? no    Do you have a sensitivity to the preservative Thimersol? no    Do you have a past history of Guillan-Barre Syndrome? no    Do you currently have an acute febrile illness? no    Have you ever had a severe reaction to latex? no    Vaccine information given and explained to patient? yes    Are you currently pregnant? no    Lot Number:[id number]   Exp Date:06/04/2011   Site Given  Left Deltoid IM    Orders Added: 1)  Admin 1st Vaccine [90471] 2)  Flu Vaccine 3yrs + [90658] 

## 2011-01-06 NOTE — Progress Notes (Signed)
Summary: Hydrocodone refill  Phone Note Refill Request Message from:  Fax from Pharmacy on September 20, 2010 4:48 PM  Refills Requested: Medication #1:  HYDROCODONE-ACETAMINOPHEN 10-500 MG  TABS take 1 every 4-6 hrs as needed pain. not to exceed 4 per day   Dosage confirmed as above?Dosage Confirmed Please advise refill?  Initial call taken by: Josph Macho RMA,  September 20, 2010 4:48 PM  Follow-up for Phone Call        OK to refill with same sig, 120 and 1 rf Follow-up by: Danise Edge MD,  September 20, 2010 5:05 PM    Prescriptions: HYDROCODONE-ACETAMINOPHEN 10-500 MG  TABS (HYDROCODONE-ACETAMINOPHEN) take 1 every 4-6 hrs as needed pain. not to exceed 4 per day  #120 x 1   Entered by:   Josph Macho RMA   Authorized by:   Danise Edge MD   Signed by:   Josph Macho RMA on 09/21/2010   Method used:   Telephoned to ...       [facility]  [address] [phone]* (retail)       [address]       [place], [place]  [address]       Ph: [phone] or [phone]       Fax: [phone]   RxID:   [id number]

## 2011-01-06 NOTE — Medication Information (Signed)
Summary: Prior Authorization Request for Celebrex  Prior Authorization Request for Celebrex   Imported By: Maryln Gottron 03/22/2010 10:53:32  _____________________________________________________________________  External Attachment:    Type:   Image     Comment:   External Document

## 2011-01-06 NOTE — Progress Notes (Signed)
Summary: refill pain med   Phone Note Refill Request Message from:  Fax from Pharmacy on November 16, 2010 1:20 PM  Refills Requested: Medication #1:  HYDROCODONE-ACETAMINOPHEN 10-500 MG  TABS take 1 every 4-6 hrs as needed pain. not to exceed 4 per day [facility]    Method Requested: Fax to Local Pharmacy Initial call taken by: Duard Brady LPN,  November 16, 2010 1:21 PM    Prescriptions: HYDROCODONE-ACETAMINOPHEN 10-500 MG  TABS (HYDROCODONE-ACETAMINOPHEN) take 1 every 4-6 hrs as needed pain. not to exceed 4 per day  #120 x 3   Entered by:   Duard Brady LPN   Authorized by:   Judithann Sheen MD   Signed by:   Duard Brady LPN on 30/86/5784   Method used:   Historical   RxID:   [id number]  faxed back to [facility]

## 2011-01-06 NOTE — Progress Notes (Signed)
Summary: Diazepam refill  Phone Note Refill Request Message from:  Fax from Pharmacy on September 21, 2010 2:49 PM  Refills Requested: Medication #1:  DIAZEPAM 10 MG  TABS 1  three times a day as needed for stress   Dosage confirmed as above?Dosage Confirmed Please advise?  Initial call taken by: Josph Macho RMA,  September 21, 2010 2:50 PM  Follow-up for Phone Call        OK to give with same sig #90 with 1 rf    Prescriptions: DIAZEPAM 10 MG  TABS (DIAZEPAM) 1  three times a day as needed for stress  #90 x 1   Entered by:   Josph Macho RMA   Authorized by:   Danise Edge MD   Signed by:   Josph Macho RMA on 09/22/2010   Method used:   Telephoned to ...       [facility]  [address] [phone]* (retail)       [address]       [place], [place]  [address]       Ph: [phone] or [phone]       Fax: [phone]   RxID:   [id number]

## 2011-01-06 NOTE — Procedures (Signed)
Summary: EGD   EGD  Procedure date:  05/31/2002  Findings:      Location: [facility]                     [facility]  Patient:    Lindsey Rowland, Lindsey Rowland Visit Number: [id number] MRN: [id number]          Service Type: END Location: ENDO Attending Physician:  Sabino Gasser Dictated by:   Sabino Gasser, M.D. Proc. Date: 05/31/02 Admit Date:  05/31/2002   CC:         Tinnie Gens C. Quintella Reichert, M.D.   Procedure Report  PROCEDURE:  Upper endoscopy.  INDICATIONS:  Gastroesophageal reflux disease.  ANESTHESIA:  Demerol 50, Versed 6 mg.  DESCRIPTION OF PROCEDURE:  With the patient mildly sedated in the left lateral decubitus position, the Olympus videoscopic endoscope was inserted into the mouth and passed under direct vision through the esophagus which appeared normal.  There was no evidence of Barretts.  We entered into the stomach; fundus, body, antrum, duodenal bulb, and second portion of duodenum all appeared normal.  From this point, the endoscope was slowly withdrawn, taking circumferential views of the entire duodenal mucosa until the endoscope then pulled back into the stomach and placed in retroflexion to view the stomach from below.  The endoscope was then straightened and withdrawn, taking circumferential views of the remaining gastric and esophageal mucosa. The patients vital signs and pulse oximeter remained stable.  The patient tolerated the procedure well without apparent complications.  FINDINGS:  This is a negative endoscopic examination.  PLAN:  Continue aggressive therapy and proceed to colonoscopy as planned. Dictated by:   Sabino Gasser, M.D. Attending Physician:  Sabino Gasser DD:  05/31/02 TD:  06/01/02 Job: [id number]

## 2011-01-06 NOTE — Assessment & Plan Note (Signed)
Summary: consult re: swelling in lft ankle/cjr   Vital Signs:  Patient profile:   60 year old female Weight:      126 pounds O2 Sat:      98 % Temp:     99.1 degrees F Pulse rate:   84 / minute Pulse rhythm:   regular BP sitting:   140 / 80  Vitals Entered By: Pura Spice, RN (June 29, 2010 4:22 PM) CC: concerned about Bp Check c hest area and ck left ankle swollen   History of Present Illness: This 60 year old married female is concerned about the elevation of her blood pressure might have been normally 110-120 systolic 7 right ear diastolic however over the last 2 weeks has ranged from 1:30 to 160 systolic 90-96 distolic Patient has had anterior chest pain as well as pain over left are previous with muscle spasm and referred pain primarily from the back to the shoulder arm and anterior chest, no pain on exertion no pressure type pain no diaphoresis She complains of having had swollen left ankle and foot as well as the lower left leg has decreased over the past 2 days Patient continues to smoke and has had a persistent tach and cough over the last 2-3 weeks  Allergies (verified): No Known Drug Allergies  Past History:  Past Surgical History: Last updated: 04/16/2010 T/A Appendectomy 2003 Tonsillectomy  Social History: Last updated: 04/16/2010 Occupation: Chartered loss adjuster Married Current Smoker Alcohol use-yes Drug use-no Regular exercise-no   Risk Factors: Smoking Status: current (03/09/2010) Packs/Day: 0.5 (03/09/2010)  Past Medical History: Arthritis Migraine Hx of polio COPD Headache Bronchitis Ulcers ruptured appendicitis adhesive disease  poliomyelitis at age 61  Review of Systems      See HPI  The patient denies anorexia, fever, weight loss, weight gain, vision loss, decreased hearing, hoarseness, chest pain, syncope, dyspnea on exertion, peripheral edema, prolonged cough, headaches, hemoptysis, abdominal pain, melena, hematochezia, severe  indigestion/heartburn, hematuria, incontinence, genital sores, muscle weakness, suspicious skin lesions, transient blindness, difficulty walking, depression, unusual weight change, abnormal bleeding, enlarged lymph nodes, angioedema, breast masses, and testicular masses.    Physical Exam  General:  Well-developed,well-nourished,in no acute distress; alert,appropriate and cooperative throughout examination Neck:  No deformities, masses, or tenderness noted. Chest Wall:  on the left the second costochondral joint is enlarged compared to the other joint root no tenderness today Breasts:  No mass, nodules, thickening, tenderness, bulging, retraction, inflamation, nipple discharge or skin changes noted.   Lungs:  bronchial breath soundsno dullness, no fremitus, no crackles, and no wheezes.   Heart:  Normal rate and regular rhythm. S1 and S2 normal without gallop, murmur, click, rub or other extra sounds. Msk:  tenderness and muscle spasm of the left trapezius Extremities:  right leg is smaller than the left leg  secondary to having had polio Trace of ankle edema on the left   Impression & Recommendations:  Problem # 1:  HYPERTENSION, MILD (ICD-401.1) Assessment New  Her updated medication list for this problem includes:    Lisinopril 20 Mg Tabs (Lisinopril) .Marland Kitchen... 1 once daily for hypertension  Problem # 2:  MUSCLE SPASM (ICD-728.85) Assessment: New Flexeril 10 mg t.i.d.  Problem # 3:  COUGH (ICD-786.2) Assessment: New  Orders: T-2 View CXR (71020TC)  Problem # 4:  CIGARETTE SMOKER (ICD-305.1) Assessment: Unchanged  Orders: T-2 View CXR (71020TC)  Problem # 5:  HEADACHE (ICD-784.0) Assessment: Unchanged  Her updated medication list for this problem includes:    Hydrocodone-acetaminophen 10-500 Mg  Tabs (Hydrocodone-acetaminophen) .Marland Kitchen... Take 1 every 4-6 hrs as needed pain. not to exceed 4 per day    Celebrex 200 Mg Caps (Celecoxib) .Marland Kitchen... 1 by mouth two times a day after meals   needs office visit  Complete Medication List: 1)  Protonix 40 Mg Tbec (Pantoprazole sodium) .... Once daily 2)  Diazepam 10 Mg Tabs (Diazepam) .Marland Kitchen.. 1  three times a day as needed for stress 3)  Hydrocodone-acetaminophen 10-500 Mg Tabs (Hydrocodone-acetaminophen) .... Take 1 every 4-6 hrs as needed pain. not to exceed 4 per day 4)  Combivent 103-18 Mcg/act Aero (Albuterol-ipratropium) .... 2 inhalation as needed for wheezing 5)  Diphenoxylate-atropine 2.5-0.025 Mg Tabs (Diphenoxylate-atropine) .... Take 2 tablets stat then  2 tablets qid for diarrhea 6)  Boniva 150 Mg Tabs (Ibandronate sodium) .... Take 1 tablet monthly 7)  Proair Hfa 108 (90 Base) Mcg/act Aers (Albuterol sulfate) .... 2 inhalations three times a day as needed wheezing 8)  Meclizine Hcl 25 Mg Tabs (Meclizine hcl) .Marland Kitchen.. 1 stat then morn,midafternoon and hs to prevent dizziness 9)  Omnaris 50 Mcg/act Susp (Ciclesonide) .... 2 sprays each nostril qd 10)  Mucinex D [phone] Mg Xr12h-tab (Pseudoephedrine-guaifenesin) .Marland Kitchen.. 1 two times a day 11)  Celebrex 200 Mg Caps (Celecoxib) .Marland Kitchen.. 1 by mouth two times a day after meals  needs office visit 12)  Hyoscyamine Sulfate Cr 0.375 Mg Xr12h-tab (Hyoscyamine sulfate) .Marland Kitchen.. 1 each morning for irritable bowel, may increase to 1 two times a day if needed 13)  Lisinopril 20 Mg Tabs (Lisinopril) .Marland Kitchen.. 1 once daily for hypertension 14)  Flexeril 10 Mg Tabs (Cyclobenzaprine hcl) .Marland Kitchen.. 1 morn midafternoon and hs for muscle spasm  Patient Instructions: 1)  peripheral edema has improved so we will not treat you just higher or call if he recurs, keep salt in moderate intake 2)  Muscle spasm left upper back to treat with Flexeril 10 mg 3 times daily 3)  No physical findings to do up to explain cough chest x-ray ordered and will call you the results Prescriptions: FLEXERIL 10 MG TABS (CYCLOBENZAPRINE HCL) 1 morn midafternoon and hs for muscle spasm  #60 x 3   Entered and Authorized by:   Judithann Sheen  MD   Signed by:   Judithann Sheen MD on 06/29/2010   Method used:   [facility] to        [facility]  [address] [phone]* (retail)       [address]       [place], [place]  [address]       Ph: [phone] or [phone]       Fax: [phone]   RxID:   [id number] LISINOPRIL 20 MG TABS (LISINOPRIL) 1 once daily for hypertension  #30 x 11   Entered and Authorized by:   Judithann Sheen MD   Signed by:   Judithann Sheen MD on 06/29/2010   Method used:   Electronically to        [facility]  [address] [phone]* (retail)       [address]       [place], [place]  [address]       Ph: [phone] or [phone]       Fax: [phone]   RxID:   [id number]

## 2011-01-13 ENCOUNTER — Telehealth: Payer: Self-pay | Admitting: *Deleted

## 2011-01-13 DIAGNOSIS — F411 Generalized anxiety disorder: Secondary | ICD-10-CM

## 2011-01-13 NOTE — Telephone Encounter (Signed)
Refill diazepam 10 mg 1 po tid prn for stress

## 2011-01-14 MED ORDER — DIAZEPAM 10 MG PO TABS: 10.0000 mg | ORAL_TABLET | Freq: Three times a day (TID) | ORAL | Status: AC | PRN

## 2011-01-14 NOTE — Telephone Encounter (Signed)
Ok x 3 per Dr Scotty Court

## 2011-01-14 NOTE — Telephone Encounter (Signed)
Addended byAlfred Levins on: 01/14/2011 07:59 AM   Modules accepted: Orders

## 2011-03-04 ENCOUNTER — Encounter: Payer: Self-pay | Admitting: Family Medicine

## 2011-03-31 ENCOUNTER — Other Ambulatory Visit (INDEPENDENT_AMBULATORY_CARE_PROVIDER_SITE_OTHER): Payer: 59 | Admitting: Family Medicine

## 2011-03-31 DIAGNOSIS — Z0000 Encounter for general adult medical examination without abnormal findings: Secondary | ICD-10-CM

## 2011-03-31 DIAGNOSIS — Z13220 Encounter for screening for lipoid disorders: Secondary | ICD-10-CM

## 2011-03-31 LAB — HEPATIC FUNCTION PANEL
ALT: 16 U/L (ref 0–35)
Albumin: 4.1 g/dL (ref 3.5–5.2)
Alkaline Phosphatase: 76 U/L (ref 39–117)
Bilirubin, Direct: 0.1 mg/dL (ref 0.0–0.3)
Total Protein: 6.8 g/dL (ref 6.0–8.3)

## 2011-03-31 LAB — CBC WITH DIFFERENTIAL/PLATELET
Basophils Relative: 0.6 % (ref 0.0–3.0)
Eosinophils Relative: 1.1 % (ref 0.0–5.0)
Hemoglobin: 14.7 g/dL (ref 12.0–15.0)
Lymphocytes Relative: 29.3 % (ref 12.0–46.0)
MCHC: 34.6 g/dL (ref 30.0–36.0)
MCV: 100.8 fl — ABNORMAL HIGH (ref 78.0–100.0)
Monocytes Absolute: 0.4 10*3/uL (ref 0.1–1.0)
Neutro Abs: 3.8 10*3/uL (ref 1.4–7.7)
Neutrophils Relative %: 62.5 % (ref 43.0–77.0)
RBC: 4.21 Mil/uL (ref 3.87–5.11)
WBC: 6 10*3/uL (ref 4.5–10.5)

## 2011-03-31 LAB — BASIC METABOLIC PANEL
CO2: 27 mEq/L (ref 19–32)
Chloride: 105 mEq/L (ref 96–112)
Creatinine, Ser: 0.7 mg/dL (ref 0.4–1.2)
Potassium: 4.3 mEq/L (ref 3.5–5.1)
Sodium: 142 mEq/L (ref 135–145)

## 2011-03-31 LAB — POCT URINALYSIS DIPSTICK
Bilirubin, UA: NEGATIVE
Glucose, UA: NEGATIVE
Ketones, UA: NEGATIVE
Leukocytes, UA: NEGATIVE
Nitrite, UA: NEGATIVE

## 2011-03-31 LAB — TSH: TSH: 0.92 u[IU]/mL (ref 0.35–5.50)

## 2011-03-31 LAB — LDL CHOLESTEROL, DIRECT: Direct LDL: 142.9 mg/dL

## 2011-04-05 ENCOUNTER — Telehealth: Payer: Self-pay

## 2011-04-05 MED ORDER — DIAZEPAM 10 MG PO TABS: 10.0000 mg | ORAL_TABLET | Freq: Three times a day (TID) | ORAL | Status: DC

## 2011-04-05 NOTE — Telephone Encounter (Signed)
Ok per Dr. Scotty Court to send in diazepam 10 mg tablet #90 with 5 refills

## 2011-04-13 ENCOUNTER — Ambulatory Visit (INDEPENDENT_AMBULATORY_CARE_PROVIDER_SITE_OTHER): Payer: 59 | Admitting: Family Medicine

## 2011-04-13 ENCOUNTER — Ambulatory Visit (INDEPENDENT_AMBULATORY_CARE_PROVIDER_SITE_OTHER)
Admission: RE | Admit: 2011-04-13 | Discharge: 2011-04-13 | Disposition: A | Discharge status: Home / Self Care | Payer: 59 | Source: Ambulatory Visit | Attending: Family Medicine | Admitting: Family Medicine

## 2011-04-13 ENCOUNTER — Encounter: Payer: Self-pay | Admitting: Family Medicine

## 2011-04-13 VITALS — BP 140/82 | HR 85 | Temp 98.6°F | Ht 66.0 in | Wt 132.0 lb

## 2011-04-13 DIAGNOSIS — K219 Gastro-esophageal reflux disease without esophagitis: Secondary | ICD-10-CM

## 2011-04-13 DIAGNOSIS — M549 Dorsalgia, unspecified: Secondary | ICD-10-CM

## 2011-04-13 DIAGNOSIS — H3320 Serous retinal detachment, unspecified eye: Secondary | ICD-10-CM

## 2011-04-13 DIAGNOSIS — M129 Arthropathy, unspecified: Secondary | ICD-10-CM

## 2011-04-13 DIAGNOSIS — M1990 Unspecified osteoarthritis, unspecified site: Secondary | ICD-10-CM

## 2011-04-13 DIAGNOSIS — R05 Cough: Secondary | ICD-10-CM

## 2011-04-13 DIAGNOSIS — Z136 Encounter for screening for cardiovascular disorders: Secondary | ICD-10-CM

## 2011-04-13 DIAGNOSIS — Z0000 Encounter for general adult medical examination without abnormal findings: Secondary | ICD-10-CM

## 2011-04-13 DIAGNOSIS — K589 Irritable bowel syndrome without diarrhea: Secondary | ICD-10-CM

## 2011-04-13 MED ORDER — IPRATROPIUM-ALBUTEROL 18-103 MCG/ACT IN AERO: 2.0000 | INHALATION_SPRAY | Freq: Four times a day (QID) | RESPIRATORY_TRACT | Status: DC | PRN

## 2011-04-13 MED ORDER — LISINOPRIL 20 MG PO TABS: 20.0000 mg | ORAL_TABLET | Freq: Every day | ORAL | Status: DC

## 2011-04-13 MED ORDER — PANTOPRAZOLE SODIUM 40 MG PO TBEC: 40.0000 mg | DELAYED_RELEASE_TABLET | Freq: Every day | ORAL | Status: DC

## 2011-04-13 MED ORDER — CYCLOBENZAPRINE HCL 10 MG PO TABS
10.0000 mg | ORAL_TABLET | Freq: Three times a day (TID) | ORAL | Status: DC | PRN
Start: 2011-04-13 — End: 2012-04-23

## 2011-04-13 MED ORDER — HYOSCYAMINE SULFATE ER 0.375 MG PO TB12: 0.3750 mg | ORAL_TABLET | Freq: Two times a day (BID) | ORAL | Status: DC | PRN

## 2011-04-13 MED ORDER — CELECOXIB 200 MG PO CAPS: 200.0000 mg | ORAL_CAPSULE | Freq: Two times a day (BID) | ORAL | Status: DC

## 2011-04-13 MED ORDER — DIAZEPAM 10 MG PO TABS: 10.0000 mg | ORAL_TABLET | Freq: Three times a day (TID) | ORAL | Status: DC

## 2011-04-13 MED ORDER — IBANDRONATE SODIUM 150 MG PO TABS: 150.0000 mg | ORAL_TABLET | ORAL | Status: DC

## 2011-04-13 NOTE — Patient Instructions (Signed)
Go to [facility] on [address], across from [address] to get back and  Chest Xray Lab studies were good. Refilled medications

## 2011-04-22 NOTE — Procedures (Signed)
[facility]  Patient:    Lindsey Rowland, Lindsey Rowland Visit Number: [id number] MRN: [id number]          Service Type: END Location: ENDO Attending Physician:  Sabino Gasser Dictated by:   Sabino Gasser, M.D. Proc. Date: 05/31/02 Admit Date:  05/31/2002   CC:         Tinnie Gens C. Quintella Reichert, M.D.   Procedure Report  PROCEDURE:  Upper endoscopy.  INDICATIONS:  Gastroesophageal reflux disease.  ANESTHESIA:  Demerol 50, Versed 6 mg.  DESCRIPTION OF PROCEDURE:  With the patient mildly sedated in the left lateral decubitus position, the Olympus videoscopic endoscope was inserted into the mouth and passed under direct vision through the esophagus which appeared normal.  There was no evidence of Barretts.  We entered into the stomach; fundus, body, antrum, duodenal bulb, and second portion of duodenum all appeared normal.  From this point, the endoscope was slowly withdrawn, taking circumferential views of the entire duodenal mucosa until the endoscope then pulled back into the stomach and placed in retroflexion to view the stomach from below.  The endoscope was then straightened and withdrawn, taking circumferential views of the remaining gastric and esophageal mucosa. The patients vital signs and pulse oximeter remained stable.  The patient tolerated the procedure well without apparent complications.  FINDINGS:  This is a negative endoscopic examination.  PLAN:  Continue aggressive therapy and proceed to colonoscopy as planned. Dictated by:   Sabino Gasser, M.D. Attending Physician:  Sabino Gasser DD:  05/31/02 TD:  06/01/02 Job: [id number]

## 2011-04-22 NOTE — Procedures (Signed)
[facility]  Patient:    Lindsey Rowland, Lindsey Rowland Visit Number: [id number] MRN: [id number]          Service Type: END Location: ENDO Attending Physician:  Sabino Gasser Dictated by:   Sabino Gasser, M.D. Proc. Date: 05/31/02 Admit Date:  05/31/2002   CC:         Tinnie Gens C. Quintella Reichert, M.D.   Procedure Report  PROCEDURE:  Colonoscopy.  INDICATIONS:  Colon cancer screening.  Rectal bleeding attributed to hemorrhoids.  ANESTHESIA:  Demerol 20, Versed 2 mg.  DESCRIPTION OF PROCEDURE:  With the patient mildly sedated in the left lateral decubitus position, a rectal examination was performed.  Subsequently, the Olympus videoscopic colonoscope was inserted into the rectum and passed under direct vision to the cecum, identified by the ileocecal valve and appendiceal orifice, both of which were photographed.  From this point, the colonoscope was slowly withdrawn, taking circumferential views of the entire colonic mucosa, stopping in the rectum which appeared normal on direct and showed hemorrhoids on retroflexed view.  The endoscope was straightened and withdrawn.  The patients vital signs and pulse oximeter remained stable.  The patient tolerated the procedure well without apparent complications.  FINDINGS:  Internal hemorrhoids, otherwise unremarkable colonoscopic examination to the cecum.  PLAN:  Will have patient follow up with me as an outpatient. Dictated by:   Sabino Gasser, M.D. Attending Physician:  Sabino Gasser DD:  05/31/02 TD:  06/01/02 Job: [id number]

## 2011-04-25 NOTE — Progress Notes (Signed)
Quick Note:  Pt is aware. ______ 

## 2011-05-04 ENCOUNTER — Other Ambulatory Visit: Payer: Self-pay

## 2011-05-04 MED ORDER — HYDROCODONE-ACETAMINOPHEN 10-500 MG PO TABS: ORAL_TABLET | ORAL | Status: DC

## 2011-05-08 ENCOUNTER — Encounter: Payer: Self-pay | Admitting: Family Medicine

## 2011-05-08 DIAGNOSIS — H35379 Puckering of macula, unspecified eye: Secondary | ICD-10-CM

## 2011-05-08 DIAGNOSIS — H3320 Serous retinal detachment, unspecified eye: Secondary | ICD-10-CM | POA: Insufficient documentation

## 2011-05-08 HISTORY — DX: Puckering of macula, unspecified eye: H35.379

## 2011-05-08 NOTE — Progress Notes (Signed)
  Subjective:    Patient ID: Lindsey Rowland, female    DOB: 1951/06/14, 60 y.o.   MRN: [id number] This 60 year old white married female is in for her annual physical examination. He goes to Dr. Sherian Rein gynecologist for her yearly Pap she relates that she had a detached retina in the right eye January 2012 and her vision is decreased even now she complains of no energy Continues to have pain in the lumbosacral area from arthritisGERD is controlled with Protopic also IBS controlled with Levbid b.i.d. and continues to be a chronic problem She continues to smoke despite having COPD she relates she is decreasing her smoking Continues to have rhinitis uses Nasonex nasal spray chronic anxiety control with ValiumHPI    Review of Systems  Constitutional: Positive for fatigue.  HENT: Positive for congestion, rhinorrhea and postnasal drip.   Eyes: Positive for visual disturbance.       Retinal detachment right eye January 2012  Gastrointestinal: Positive for diarrhea. Negative for vomiting, abdominal pain, constipation, blood in stool, abdominal distention, anal bleeding and rectal pain.       Being treated for GERD and irritable bowel syndrome  Genitourinary: Negative.   Musculoskeletal: Positive for back pain and joint swelling.  Neurological: Negative.   Hematological: Negative.   Psychiatric/Behavioral: Positive for agitation.       Objective:   Physical Exam The patient is a well-developed well-nourished white female who is in no distress pleasant and cooperative HEENT decreased vision right eye nasal mucosa edematous pale and boggy with clear discharge his normal carotid pulses are good and equal bilaterally thyroid is nonpalpable Lungs decreased breath sounds with minimal expiratory wheeze in both bases no rales no dullness to percussion Heart no evidence of cardiomegaly heart sounds are good , no murmurs regular rhythm Breasts full no masses palpable no tenderness nipples normal axilla  clear no lymphadenopathy Abdomen liver spleen and kidneys are nonpalpable aorta percusses normal bowel sounds slightly increased Pelvic examination to be done by gynecologist also a rectal to be done by gynecologist Spine tenderness both SI joints bilaterally Neurological examination negative.reflex left knee absent left leg is smaller than right due to polio in the past Iskin examination negative          Assessment & Plan:  Annual physical examination reveals a patient with COPD, GERD, irritable bowel syndrome allergic rhinitis chronic back pain chronic anxiety and well controlled hypertension Refill medications Reviewed laboratory studies which were good Recommend chest x-ray to be done in the future, today if possible

## 2011-06-29 ENCOUNTER — Other Ambulatory Visit: Payer: Self-pay

## 2011-06-29 MED ORDER — HYDROCODONE-ACETAMINOPHEN 10-500 MG PO TABS: ORAL_TABLET | ORAL | Status: DC

## 2011-06-29 NOTE — Telephone Encounter (Signed)
rx faxed to pharmacy

## 2011-09-06 ENCOUNTER — Encounter: Payer: Self-pay | Admitting: Family Medicine

## 2011-09-06 ENCOUNTER — Ambulatory Visit (INDEPENDENT_AMBULATORY_CARE_PROVIDER_SITE_OTHER): Payer: 59 | Admitting: Family Medicine

## 2011-09-06 ENCOUNTER — Telehealth: Payer: Self-pay

## 2011-09-06 VITALS — BP 150/88 | Temp 98.8°F | Wt 125.0 lb

## 2011-09-06 DIAGNOSIS — J209 Acute bronchitis, unspecified: Secondary | ICD-10-CM

## 2011-09-06 DIAGNOSIS — J449 Chronic obstructive pulmonary disease, unspecified: Secondary | ICD-10-CM

## 2011-09-06 MED ORDER — HYDROCODONE-HOMATROPINE 5-1.5 MG/5ML PO SYRP: 5.0000 mL | ORAL_SOLUTION | Freq: Four times a day (QID) | ORAL | Status: DC | PRN

## 2011-09-06 MED ORDER — DOXYCYCLINE HYCLATE 100 MG PO TABS: 100.0000 mg | ORAL_TABLET | Freq: Two times a day (BID) | ORAL | Status: AC

## 2011-09-06 NOTE — Patient Instructions (Signed)
Acute Bronchitis You have acute bronchitis. This means you have a chest cold. The airways in your lungs are inflamed (red and sore). Acute means it is sudden onset. Bronchitis is most often caused by a virus. In smokers, people with chronic lung problems, and elderly patients, treatment with antibiotics for bacterial infection may be needed. Exposure to cigarette smoke or irritating chemicals will make bronchitis worse. Allergies and asthma can also make bronchitis worse. Repeated episodes of bronchitis may cause long standing lung problems. Acute bronchitis is usually treated with rest, fluids, and medicines for relief of fever or cough. Bronchodilator medicines from metered inhalers or a nebulizer may be used to help open up the small airways. This reduces shortness of breath and helps control cough. Antibiotics can be prescribed if you are more seriously ill or at risk. A cool air vaporizer may help thin bronchial secretions and make it easier to clear your chest. Increased fluids may also help. You must avoid smoking, even second hand exposure. If you are a cigarette smoker, consider using nicotine gum or skin patches to help control withdrawal symptoms. Recovery from bronchitis is often slow, but you should start feeling better after 2-3 days. Cough from bronchitis frequently lasts for 3-4 weeks.  SEEK IMMEDIATE MEDICAL CARE IF YOU DEVELOP:  Increased fever, chills, or chest pain.   Severe shortness of breath or bloody sputum.   Dehydration, fainting, repeated vomiting, severe headache.   No improvement after one week of proper treatment.  MAKE SURE YOU:   Understand these instructions.   Will watch your condition.   Will get help right away if you are not doing well or get worse.  Document Released: 12/29/2004 Document Re-Released: 11/03/2008 [employer] Patient Information 2011 [employer]. 

## 2011-09-06 NOTE — Telephone Encounter (Signed)
Pt called and stated her throat is raw and her ears are clogged

## 2011-09-06 NOTE — Progress Notes (Signed)
  Subjective:    Patient ID: Lindsey Rowland, female    DOB: [birth year], 60 y.o.   MRN: [id number]  HPI Acute visit. Onset 3 days ago sore throat, bilateral ear pressure and cough productive of yellow-green sputum. Long-term smoker. History of COPD. Some pain with coughing but no pleuritic pain. No hemoptysis. No confirmed fever. Over-the-counter cough medicine without much relief. She takes Combivent regularly   Review of Systems  Constitutional: Negative for fever and chills.  HENT: Positive for sore throat. Negative for sinus pressure.   Respiratory: Positive for cough. Negative for shortness of breath and wheezing.   Cardiovascular: Negative for chest pain.       Objective:   Physical Exam  Constitutional: She appears well-developed and well-nourished.  HENT:  Right Ear: External ear normal.  Left Ear: External ear normal.  Mouth/Throat: Oropharynx is clear and moist.  Neck: Neck supple. No thyromegaly present.  Cardiovascular: Normal rate and regular rhythm.   Pulmonary/Chest: Effort normal and breath sounds normal. No respiratory distress. She has no wheezes. She has no rales.  Lymphadenopathy:    She has no cervical adenopathy.          Assessment & Plan:  Acute exacerbation of COPD. Pulse ox 98%. Given productive cough and color changes with known COPD start doxycycline 100 mg twice daily for 10 days. Continue Combivent. Hycodan for cough suppression

## 2011-09-06 NOTE — Telephone Encounter (Signed)
Called pt to make appt with another physician.

## 2011-09-12 NOTE — Telephone Encounter (Signed)
Attempted to call pt again; no return call.

## 2011-09-20 ENCOUNTER — Ambulatory Visit (INDEPENDENT_AMBULATORY_CARE_PROVIDER_SITE_OTHER): Payer: 59

## 2011-09-20 ENCOUNTER — Other Ambulatory Visit: Payer: Self-pay | Admitting: Family Medicine

## 2011-09-20 DIAGNOSIS — Z23 Encounter for immunization: Secondary | ICD-10-CM

## 2011-09-20 NOTE — Telephone Encounter (Signed)
Dr Scotty Court pt

## 2011-09-20 NOTE — Telephone Encounter (Signed)
Why is pt requesting?  I don't see as regular med.

## 2011-10-05 ENCOUNTER — Other Ambulatory Visit: Payer: Self-pay | Admitting: *Deleted

## 2011-10-05 NOTE — Telephone Encounter (Signed)
Refill once only but needs office follow up if no better after that.

## 2011-10-05 NOTE — Telephone Encounter (Signed)
Hycodan refill request, 1 tsp every 6 hours prn cough, last filled 09/06/11  #175ml, Dr Scotty Court

## 2011-10-06 MED ORDER — HYDROCODONE-HOMATROPINE 5-1.5 MG/5ML PO SYRP: 5.0000 mL | ORAL_SOLUTION | Freq: Four times a day (QID) | ORAL | Status: AC | PRN

## 2011-10-06 NOTE — Telephone Encounter (Signed)
Pharmacist called to check on status of getting pts Hycodan syrup refilled. Pls call asap.

## 2011-12-06 HISTORY — PX: BREAST BIOPSY: SHX20

## 2011-12-24 ENCOUNTER — Encounter: Payer: Self-pay | Admitting: Family Medicine

## 2011-12-27 ENCOUNTER — Other Ambulatory Visit: Payer: Self-pay | Admitting: *Deleted

## 2011-12-27 ENCOUNTER — Ambulatory Visit: Payer: 59 | Admitting: Family Medicine

## 2011-12-27 MED ORDER — HYDROCODONE-ACETAMINOPHEN 10-500 MG PO TABS: ORAL_TABLET | ORAL | Status: DC

## 2011-12-27 NOTE — Telephone Encounter (Signed)
rx called in and appointment cancelled per Dr Tawanna Cooler.

## 2011-12-28 ENCOUNTER — Encounter: Payer: Self-pay | Admitting: Internal Medicine

## 2011-12-28 ENCOUNTER — Ambulatory Visit (INDEPENDENT_AMBULATORY_CARE_PROVIDER_SITE_OTHER): Payer: 59 | Admitting: Internal Medicine

## 2011-12-28 DIAGNOSIS — J309 Allergic rhinitis, unspecified: Secondary | ICD-10-CM

## 2011-12-28 DIAGNOSIS — M129 Arthropathy, unspecified: Secondary | ICD-10-CM

## 2011-12-28 DIAGNOSIS — I10 Essential (primary) hypertension: Secondary | ICD-10-CM

## 2011-12-28 DIAGNOSIS — J40 Bronchitis, not specified as acute or chronic: Secondary | ICD-10-CM

## 2011-12-28 DIAGNOSIS — J449 Chronic obstructive pulmonary disease, unspecified: Secondary | ICD-10-CM

## 2011-12-28 DIAGNOSIS — J4489 Other specified chronic obstructive pulmonary disease: Secondary | ICD-10-CM

## 2011-12-28 DIAGNOSIS — F17200 Nicotine dependence, unspecified, uncomplicated: Secondary | ICD-10-CM

## 2011-12-28 MED ORDER — HYDROCODONE-HOMATROPINE 5-1.5 MG/5ML PO SYRP
5.0000 mL | ORAL_SOLUTION | Freq: Four times a day (QID) | ORAL | Status: AC | PRN
Start: 2011-12-28 — End: 2012-01-07

## 2011-12-28 NOTE — Progress Notes (Signed)
  Subjective:    Patient ID: Lindsey Rowland, female    DOB: [birth year], 61 y.o.   MRN: [id number]  HPI  61 year old patient who is a former patient of Dr. Charmian Muff. She presents today to reestablish at the [facility] office. She presents with a chief complaint of 8 days of chest congestion and cough. Earlier she had a green productive cough but this has improved remains weak but her cough has improved and is now nonproductive. Denies any fever chills wheezing or shortness of breath. She does have a history of COPD and on going low-grade tobacco use. She smokes one or 2 cigarettes per day. She does have albuterol rescue medication that has not been required. Medical records reviewed. She does have a history of osteoarthritis and is on Boniva for osteoporosis. She has remote history of ulcer disease and uses Protonix. She has hypertension controlled with Zestril.    Review of Systems  Constitutional: Positive for fatigue.  HENT: Positive for congestion and sinus pressure. Negative for hearing loss, sore throat, rhinorrhea, dental problem and tinnitus.   Eyes: Negative for pain, discharge and visual disturbance.  Respiratory: Positive for cough. Negative for shortness of breath.   Cardiovascular: Negative for chest pain, palpitations and leg swelling.  Gastrointestinal: Negative for nausea, vomiting, abdominal pain, diarrhea, constipation, blood in stool and abdominal distention.  Genitourinary: Negative for dysuria, urgency, frequency, hematuria, flank pain, vaginal bleeding, vaginal discharge, difficulty urinating, vaginal pain and pelvic pain.  Musculoskeletal: Positive for arthralgias. Negative for joint swelling and gait problem.  Skin: Negative for rash.  Neurological: Negative for dizziness, syncope, speech difficulty, weakness, numbness and headaches.  Hematological: Negative for adenopathy.  Psychiatric/Behavioral: Negative for behavioral problems, dysphoric mood and agitation. The  patient is not nervous/anxious.        Objective:   Physical Exam  Constitutional: She is oriented to person, place, and time. She appears well-developed and well-nourished. No distress.       Afebrile. Slightly hoarse blood pressure low normal  HENT:  Head: Normocephalic.  Right Ear: External ear normal.  Left Ear: External ear normal.  Mouth/Throat: Oropharynx is clear and moist.  Eyes: Conjunctivae and EOM are normal. Pupils are equal, round, and reactive to light.  Neck: Normal range of motion. Neck supple. No thyromegaly present.  Cardiovascular: Normal rate, regular rhythm, normal heart sounds and intact distal pulses.   Pulmonary/Chest: Effort normal and breath sounds normal.  Abdominal: Soft. Bowel sounds are normal. She exhibits no mass. There is no tenderness.  Musculoskeletal: Normal range of motion.  Lymphadenopathy:    She has no cervical adenopathy.  Neurological: She is alert and oriented to person, place, and time.  Skin: Skin is warm and dry. No rash noted.  Psychiatric: She has a normal mood and affect. Her behavior is normal.          Assessment & Plan:   COPD with viral bronchitis. Seems to be improving. We'll treat symptomatically and observe Ongoing tobacco use. Total tobacco cessation encouraged Hypertension well controlled History of allergic rhinitis History of osteoarthritis  Medications updated Return in 4 months or as needed

## 2011-12-28 NOTE — Patient Instructions (Addendum)
Acute bronchitis symptoms for less than 10 days are generally not helped by antibiotics.  Take over-the-counter expectorants and cough medications such as  Mucinex DM.  Call if there is no improvement in 5 to 7 days or if he developed worsening cough, fever, or new symptoms, such as shortness of breath or chest pain.  Smoking tobacco is very bad for your health. You should stop smoking immediately. 

## 2012-02-06 ENCOUNTER — Other Ambulatory Visit: Payer: Self-pay

## 2012-02-06 MED ORDER — HYDROCODONE-ACETAMINOPHEN 7.5-500 MG PO TABS: 1.0000 | ORAL_TABLET | Freq: Three times a day (TID) | ORAL | Status: AC | PRN

## 2012-02-06 NOTE — Telephone Encounter (Signed)
Lortab 7.5 #90. Notify patient this must last 30 days

## 2012-02-06 NOTE — Telephone Encounter (Signed)
[facility] for lortab 10-500 Last seen 12/27/11 - est care - [place] transfer Last written 12/27/11 # 120  0RF Please advise

## 2012-02-06 NOTE — Telephone Encounter (Signed)
Spoke with pt - informed of change to med and that I called it in to [facility]

## 2012-02-23 DIAGNOSIS — H269 Unspecified cataract: Secondary | ICD-10-CM

## 2012-02-23 DIAGNOSIS — Z961 Presence of intraocular lens: Secondary | ICD-10-CM | POA: Insufficient documentation

## 2012-02-23 DIAGNOSIS — H33001 Unspecified retinal detachment with retinal break, right eye: Secondary | ICD-10-CM | POA: Insufficient documentation

## 2012-02-23 HISTORY — DX: Unspecified retinal detachment with retinal break, right eye: H33.001

## 2012-02-23 HISTORY — DX: Unspecified cataract: H26.9

## 2012-02-23 HISTORY — DX: Presence of intraocular lens: Z96.1

## 2012-03-09 ENCOUNTER — Other Ambulatory Visit: Payer: Self-pay

## 2012-03-09 MED ORDER — HYDROCODONE-ACETAMINOPHEN 7.5-500 MG PO TABS: 1.0000 | ORAL_TABLET | Freq: Four times a day (QID) | ORAL | Status: AC | PRN

## 2012-03-09 MED ORDER — HYDROCODONE-ACETAMINOPHEN 5-500 MG PO TABS: 1.0000 | ORAL_TABLET | Freq: Three times a day (TID) | ORAL | Status: DC | PRN

## 2012-03-09 NOTE — Telephone Encounter (Signed)
90

## 2012-03-09 NOTE — Telephone Encounter (Signed)
[facility] vicodin 5-500 Last seen 12/28/11 - [place] transfer Last written by you 02/06/12 # 90 - must last 30 days Please advise

## 2012-03-12 NOTE — Telephone Encounter (Signed)
This was called in on Friday 4/5

## 2012-04-09 ENCOUNTER — Other Ambulatory Visit: Payer: Self-pay | Admitting: Family Medicine

## 2012-04-09 NOTE — Telephone Encounter (Signed)
[facility] lorcet 7.5-500 Last seen 12/28/11 [facility] transfer Last written 4.5.13 # 90 - with instructions to last 30 days  Please advise

## 2012-04-10 MED ORDER — HYDROCODONE-ACETAMINOPHEN 7.5-500 MG PO TABS: 1.0000 | ORAL_TABLET | Freq: Four times a day (QID) | ORAL | Status: DC | PRN

## 2012-04-10 NOTE — Telephone Encounter (Signed)
Called in.

## 2012-04-10 NOTE — Telephone Encounter (Signed)
ok 

## 2012-04-23 ENCOUNTER — Emergency Department (HOSPITAL_COMMUNITY)
Admission: EM | Admit: 2012-04-23 | Discharge: 2012-04-23 | Disposition: A | Discharge status: Home / Self Care | Payer: 59 | Attending: Emergency Medicine | Admitting: Emergency Medicine

## 2012-04-23 ENCOUNTER — Encounter (HOSPITAL_COMMUNITY): Payer: Self-pay | Admitting: Emergency Medicine

## 2012-04-23 ENCOUNTER — Emergency Department (HOSPITAL_COMMUNITY): Payer: 59

## 2012-04-23 DIAGNOSIS — T148XXA Other injury of unspecified body region, initial encounter: Secondary | ICD-10-CM

## 2012-04-23 DIAGNOSIS — J449 Chronic obstructive pulmonary disease, unspecified: Secondary | ICD-10-CM | POA: Insufficient documentation

## 2012-04-23 DIAGNOSIS — M129 Arthropathy, unspecified: Secondary | ICD-10-CM | POA: Insufficient documentation

## 2012-04-23 DIAGNOSIS — S51009A Unspecified open wound of unspecified elbow, initial encounter: Secondary | ICD-10-CM | POA: Insufficient documentation

## 2012-04-23 DIAGNOSIS — Z79899 Other long term (current) drug therapy: Secondary | ICD-10-CM | POA: Insufficient documentation

## 2012-04-23 DIAGNOSIS — M25529 Pain in unspecified elbow: Secondary | ICD-10-CM | POA: Insufficient documentation

## 2012-04-23 DIAGNOSIS — IMO0002 Reserved for concepts with insufficient information to code with codable children: Secondary | ICD-10-CM | POA: Insufficient documentation

## 2012-04-23 DIAGNOSIS — J4489 Other specified chronic obstructive pulmonary disease: Secondary | ICD-10-CM | POA: Insufficient documentation

## 2012-04-23 MED ORDER — TETANUS-DIPHTH-ACELL PERTUSSIS 5-2.5-18.5 LF-MCG/0.5 IM SUSP
0.5000 mL | Freq: Once | INTRAMUSCULAR | Status: AC
  Administered 2012-04-23: 0.5 mL via INTRAMUSCULAR
  Filled 2012-04-23: qty 0.5

## 2012-04-23 MED ORDER — IBUPROFEN 800 MG PO TABS
800.0000 mg | ORAL_TABLET | Freq: Once | ORAL | Status: AC
  Administered 2012-04-23: 800 mg via ORAL
  Filled 2012-04-23: qty 1

## 2012-04-23 NOTE — ED Notes (Signed)
Pt thinks piece of toothpick broke off into right arm; small puncture noted with hard area under could be foreign body; pt sts last TD is unknown

## 2012-04-23 NOTE — ED Provider Notes (Signed)
History     CSN: [id number]  Arrival date & time 04/23/12  8295   First MD Initiated Contact with Patient 04/23/12 [phone]      Chief Complaint  Patient presents with  . Foreign Body    (Consider location/radiation/quality/duration/timing/severity/associated sxs/prior treatment) Patient is a 61 y.o. female presenting with foreign body. The history is provided by the patient.  Foreign Body  The current episode started 1 to 2 hours ago. Pertinent negatives include no fever. Associated symptoms comments: Puncture wound from wooden toothpick in right elbow. She feels there is part of the toothpick left in the wound. No numbness, tingling. Increased pain with elbow flexion/extension..    Past Medical History  Diagnosis Date  . Arthritis   . COPD (chronic obstructive pulmonary disease)   . Ulcer   . Headache   . Polio     Past Surgical History  Procedure Date  . Appendectomy   . Tonsillectomy   . Retinal detachment surgery 12/2010    right eye    History reviewed. No pertinent family history.  History  Substance Use Topics  . Smoking status: Current Everyday Smoker  . Smokeless tobacco: Not on file   Comment: pt uses electronic cigarettes   . Alcohol Use: Yes    OB History    Grav Para Term Preterm Abortions TAB SAB Ect Mult Living                  Review of Systems  Constitutional: Negative for fever.  Musculoskeletal:       See HPI.  Skin: Positive for wound.  Neurological: Negative for numbness.    Allergies  Review of patient's allergies indicates no known allergies.  Home Medications   Current Outpatient Rx  Name Route Sig Dispense Refill  . ALBUTEROL SULFATE HFA 108 (90 BASE) MCG/ACT IN AERS Inhalation Inhale 2 puffs into the lungs every 6 (six) hours as needed. For shortness of breath.    . BC HEADACHE POWDER PO Oral Take 1 packet by mouth daily as needed. For pain.    . CELECOXIB 200 MG PO CAPS Oral Take 1 capsule (200 mg total) by mouth 2 (two)  times daily. 60 capsule 11  . DIAZEPAM 10 MG PO TABS Oral Take 10 mg by mouth 3 (three) times daily.    Marland Kitchen DIPHENOXYLATE-ATROPINE 2.5-0.025 MG PO TABS Oral Take 1 tablet by mouth 4 (four) times daily as needed. For diarrhea.    Marland Kitchen HYDROCODONE-ACETAMINOPHEN 7.5-500 MG PO TABS Oral Take 1 tablet by mouth every 6 (six) hours as needed. For pain.    Marland Kitchen HYOSCYAMINE SULFATE ER 0.375 MG PO TB12 Oral Take 0.375 mg by mouth every 12 (twelve) hours as needed. For irritable bowel symptoms.    Marland Kitchen MECLIZINE HCL 25 MG PO TABS Oral Take 25 mg by mouth 3 (three) times daily as needed.      . ADULT MULTIVITAMIN W/MINERALS CH Oral Take 1 tablet by mouth daily.    Marland Kitchen PANTOPRAZOLE SODIUM 40 MG PO TBEC Oral Take 40 mg by mouth daily.    Marland Kitchen PSEUDOEPHEDRINE-GUAIFENESIN ER [id number] MG PO TB12 Oral Take 1 tablet by mouth 2 (two) times daily as needed. For cough and cold.      BP 147/71  Pulse 95  Temp(Src) 97.8 F (36.6 C) (Oral)  Resp 20  SpO2 98%  Physical Exam  Constitutional: She appears well-developed and well-nourished.  Musculoskeletal: Normal range of motion. She exhibits no edema.  Skin:  Small puncture wound right elbow on posterolateral aspect. No swelling. There is a palpable, linear, firm mass that is mobile distal to the puncture wound. No bleeding. FROM with increased pain on elbow extension. Pulses distally intact. No neurosensory deficits.    ED Course  Procedures (including critical care time)  Labs Reviewed - No data to display Dg Elbow Complete Right  04/23/2012  *RADIOLOGY REPORT*  Clinical Data: Toothpick inadvertently inserted into soft tissues about the elbow  RIGHT ELBOW - COMPLETE 3+ VIEW  Comparison: None.  Findings: No acute fracture.  No dislocation.  No obvious radiopaque foreign body.  No joint effusion.  IMPRESSION: No acute bony pathology.  Original Report Authenticated By: Donavan Burnet, M.D.     No diagnosis found. 1. Puncture wound right elbow.   MDM  Patient  evaluated by Dr. Preston Fleeting. No FB on x-ray. Palpable mass non-radiopaque FB vs. inflammed tendon, favor inflammed tendon. Will treat pain and refer to ortho for recheck if no improvement in soreness over 2-3 days. Tetanus updated.        Rodena Medin, PA-C 04/23/12 1014

## 2012-04-23 NOTE — Discharge Instructions (Signed)
FOLLOW UP WITH [facility] FOR RECHECK IF NO BETTER IN 2-3 DAYS. TAKE THE LORTAB PRESCRIPTION YOU HAVE AT HOME FOR PAIN AS NEEDED. RETURN HERE AS NEEDED.  Puncture Wound A puncture wound is an injury that extends through all layers of the skin and into the tissue beneath the skin (subcutaneous tissue). Puncture wounds become infected easily because germs often enter the body and go beneath the skin during the injury. Having a deep wound with a small entrance point makes it difficult for your caregiver to adequately clean the wound. This is especially true if you have stepped on a nail and it has passed through a dirty shoe or other situations where the wound is obviously contaminated. CAUSES  Many puncture wounds involve glass, nails, splinters, fish hooks, or other objects that enter the skin (foreign bodies). A puncture wound may also be caused by a human bite or animal bite. DIAGNOSIS  A puncture wound is usually diagnosed by your history and a physical exam. You may need to have an X-ray or an ultrasound to check for any foreign bodies still in the wound. TREATMENT   Your caregiver will clean the wound as thoroughly as possible. Depending on the location of the wound, a bandage (dressing) may be applied.   Your caregiver might prescribe antibiotic medicines.   You may need a follow-up visit to check on your wound. Follow all instructions as directed by your caregiver.  HOME CARE INSTRUCTIONS   Change your dressing once per day, or as directed by your caregiver. If the dressing sticks, it may be removed by soaking the area in water.   If your caregiver has given you follow-up instructions, it is very important that you return for a follow-up appointment. Not following up as directed could result in a chronic or permanent injury, pain, and disability.   Only take over-the-counter or prescription medicines for pain, discomfort, or fever as directed by your caregiver.   If you are  given antibiotics, take them as directed. Finish them even if you start to feel better.  You may need a tetanus shot if:  You cannot remember when you had your last tetanus shot.   You have never had a tetanus shot.  If you got a tetanus shot, your arm may swell, get red, and feel warm to the touch. This is common and not a problem. If you need a tetanus shot and you choose not to have one, there is a rare chance of getting tetanus. Sickness from tetanus can be serious. You may need a rabies shot if an animal bite caused your puncture wound. SEEK MEDICAL CARE IF:   You have redness, swelling, or increasing pain in the wound.   You have red streaks going away from the wound.   You notice a bad smell coming from the wound or dressing.   You have yellowish-white fluid (pus) coming from the wound.   You are treated with an antibiotic for infection, but the infection is not getting better.   You notice something in the wound, such as rubber from your shoe, cloth, or another object.   You have a fever.   You have severe pain.   You have difficulty breathing.   You feel dizzy or faint.   You cannot stop vomiting.   You lose feeling, develop numbness, or cannot move a limb below the wound.   Your symptoms worsen.  MAKE SURE YOU:  Understand these instructions.   Will watch your condition.  Will get help right away if you are not doing well or get worse.  Document Released: 08/31/2005 Document Revised: 11/10/2011 Document Reviewed: 05/10/2011 [facility] Patient Information 2012 [place], [place].  Tetanus and Diphtheria Vaccine Your caregiver has suggested that you receive an immunization to prevent tetanus (lockjaw) and diphtheria. Tetanus and diphtheria are serious and deadly infectious diseases of the past that have been nearly wiped out by modern immunizations. Td or DT vaccines (shots) are the immunizations given to help prevent these illnesses. Td is the medical term for a  standard tetanus dose, small diphtheria dose. DT means both in standard doses. ABOUT THE DISEASES Tetanus (lockjaw) and diphtheria are serious diseases. Tetanus is caused by a germ that lives in the soil. It enters the body through a cut or wound, often caused by a nail or broken piece of glass. You cannot catch tetanus from another person. Diphtheria spreads when germs pass from an infected person to the nose or throat of others. Tetanus causes serious, painful spasms of all muscles. It can lead to:  "Locking" of the muscles of the jaw and throat, so the patient cannot open his or her mouth or swallow.   Damage to the heart muscle.  Diphtheria causes a thick coating in the nose, throat, or airway. It can lead to:  Breathing problems.   Kidney problems.   Heart failure.   Paralysis.   Death.  ABOUT THE VACCINES  A vaccine is a shot (immunization) that can help prevent a disease. Vaccines have helped lower the rates of getting certain diseases. If people stopped getting vaccinated, more people would develop illnesses. These vaccines can be used in three ways:  As catch-up for people who did not get all their doses when they were children.   As a booster dose every 10 years.   For protection against tetanus infection, after a wound.  Benefits of the vaccines Vaccination is the best way to protect against tetanus and diphtheria. Because of vaccination, there are fewer cases of these diseases. Cases are rare in children because most get a routine vaccination with DTP (Diphtheria, Tetanus, and Pertussis), DTaP (Diphtheria, Tetanus, and acellular Pertussis), or DT (Diphtheria and Tetanus) vaccines. There would be many more cases if we stopped vaccinating people. Tetanus kills about 1 in 5 people who are infected. WHEN SHOULD YOU GET TD VACCINE?  Td is made for people 23 years of age and older.   People who have not gotten at least 3 doses of any tetanus and diphtheria vaccine (DTP, DTaP or  DT) during their lifetime should do so using Td. After a person gets the third dose, a Td dose is needed every 10 years all through life. This is because protection fades over time. Booster shots are needed every 10 years.   Other vaccines may be given at the same time as Td.  You may not know today whether your immunizations are current. The vaccine given today is to protect you from your next cut or injury. It does not offer protection for the current injury. An immune globulin injection may be given, if protection is needed immediately. Check with your caregiver later regarding your immunization status. Tell your caregiver if the person getting the vaccine:  Has ever had a serious allergic reaction or other problem with Td, or any other tetanus and diphtheria vaccine (DTP, DTaP, or DT). People who have had a serious allergic reaction should not receive the vaccine.   Has epilepsy or another nervous system illness.  Has had Guillain Barre Syndrome (GBS) in the past.   Now has a moderate or severe illness.   Is pregnant.   If you are not sure, ask your caregiver.  WHAT ARE THE RISKS FROM TD VACCINE?  As with any medicine, there are very small risks that serious problems, even death, could occur after getting a vaccine. However, the risk of a serious side effect from the vaccine is almost zero.   The risks from the vaccine are much smaller than the risks from the diseases, if people stopped getting vaccinated. Both diseases can cause serious health problems, which are prevented by the vaccine.   Almost all people who get Td have no problems from it.  Mild problems If mild problems occur, they usually start within hours to a day or two after vaccination. They may last 1-2 days:  Soreness, redness, or swelling where the shot was given.   Headache or tiredness.   Occasionally, a low grade fever.  These problems can be worse in adults who get Td vaccine very often. Non-aspirin medicines  may be used to reduce soreness. Severe problems These problems happen very rarely:  Serious allergic reaction (at most, occurs in 1 in 1 million vaccinated persons). This occurs almost immediately, and is treatable with medicines. Signs of a serious allergic reaction include:   Difficulty breathing.   Hoarseness or wheezing.   Hives.   Dizziness.   Deep, aching pain and muscle wasting in upper arm(s).  Overall, the benefits to you and your family from these vaccines are far greater than the risk. WHAT TO DO IF THERE IS A SERIOUS REACTION:  Call a caregiver or get the person to a doctor or emergency room right away.   Write down what happened, the date and time it happened, and tell your caregiver.   Ask your caregiver to file a Vaccine Adverse Event Report form or call, toll-free: [phone]  If you want to learn more about this vaccine, ask your caregiver. She/he can give you the vaccine package insert or suggest other sources of information. Also, the [employer] gives compensation (payment) for persons thought to be injured by vaccines. For details call, toll-free: [phone]. Document Released: 11/18/2000 Document Revised: 11/10/2011 Document Reviewed: 10/08/2009 [facility] Patient Information 2012 [place], [place].

## 2012-04-25 NOTE — ED Provider Notes (Signed)
Medical screening examination/treatment/procedure(s) were performed by non-physician practitioner and as supervising physician I was immediately available for consultation/collaboration.   Gavin Pound. Cashe Gatt, MD 04/25/12 1553

## 2012-05-10 ENCOUNTER — Telehealth: Payer: Self-pay | Admitting: Internal Medicine

## 2012-05-10 NOTE — Telephone Encounter (Signed)
Pt has questions about her HYDROcodone-acetaminophen (LORTAB) 7.5-500 MG per tablet  Please contact.

## 2012-05-11 NOTE — Telephone Encounter (Signed)
Spoke with pt- instructions on bottle for pain med state "must last 90 days" - this was a mistake on our part - will correct at next Rf

## 2012-05-14 ENCOUNTER — Other Ambulatory Visit: Payer: Self-pay

## 2012-05-14 MED ORDER — HYDROCODONE-ACETAMINOPHEN 7.5-500 MG PO TABS: 1.0000 | ORAL_TABLET | Freq: Four times a day (QID) | ORAL | Status: DC | PRN

## 2012-05-14 NOTE — Telephone Encounter (Signed)
Called in.

## 2012-05-14 NOTE — Telephone Encounter (Signed)
Ok  #90  Needs rov

## 2012-05-14 NOTE — Telephone Encounter (Signed)
[facility]ab Last seen 12/27/10 Last written 04/10/12 # 90 0RF Please advise

## 2012-06-06 ENCOUNTER — Encounter: Payer: Self-pay | Admitting: Internal Medicine

## 2012-06-06 ENCOUNTER — Ambulatory Visit (INDEPENDENT_AMBULATORY_CARE_PROVIDER_SITE_OTHER): Payer: 59 | Admitting: Internal Medicine

## 2012-06-06 VITALS — BP 120/86 | Temp 98.1°F | Wt 130.0 lb

## 2012-06-06 DIAGNOSIS — L989 Disorder of the skin and subcutaneous tissue, unspecified: Secondary | ICD-10-CM

## 2012-06-06 DIAGNOSIS — F17200 Nicotine dependence, unspecified, uncomplicated: Secondary | ICD-10-CM

## 2012-06-06 DIAGNOSIS — J449 Chronic obstructive pulmonary disease, unspecified: Secondary | ICD-10-CM

## 2012-06-06 NOTE — Patient Instructions (Signed)
Call or return to clinic prn if these symptoms worsen or fail to improve as anticipated.

## 2012-06-06 NOTE — Progress Notes (Signed)
  Subjective:    Patient ID: Lindsey Rowland, female    DOB: 1951-04-01, 62 y.o.   MRN: [id number]  HPI  61 year old patient who has a history of COPD. Approximately 2 and half weeks ago she noted a rash involving her left buttock area. She was doing outdoor yard work and that perhaps  Sustained an insect bite.  She has been treating the lesion with topical antibiotic ointment and was concerned about the nonhealing.      Review of Systems  Skin: Positive for wound.       Objective:   Physical Exam  Constitutional: She appears well-developed and well-nourished. No distress.  Skin:       A 5- 6 mm chronic appearing shallow ulceration noted in the left buttock area          Assessment & Plan:   Resolving skin and soft tissue infection. Unclear whether this represented a infected insect bite or possible local trauma. Nonetheless the patient has a small clean appearing ulcer that seems to be healing. Local wound care discussed

## 2012-06-13 ENCOUNTER — Other Ambulatory Visit: Payer: Self-pay

## 2012-06-13 MED ORDER — HYDROCODONE-ACETAMINOPHEN 7.5-500 MG PO TABS: 1.0000 | ORAL_TABLET | Freq: Four times a day (QID) | ORAL | Status: DC | PRN

## 2012-06-13 NOTE — Telephone Encounter (Signed)
[facility] lortab 7.5-500 Last seen 06/06/12 - skin lesion Last written 05/14/12 #90 0RF Please advise

## 2012-06-13 NOTE — Telephone Encounter (Signed)
#  90 refill x2

## 2012-06-15 ENCOUNTER — Telehealth: Payer: Self-pay | Admitting: Internal Medicine

## 2012-06-15 MED ORDER — TRIAMCINOLONE ACETONIDE 0.1 % EX CREA: TOPICAL_CREAM | Freq: Two times a day (BID) | CUTANEOUS | Status: AC

## 2012-06-15 NOTE — Telephone Encounter (Signed)
Spoke with Lindsey Rowland- informed of rx to be sent to [facility]

## 2012-06-15 NOTE — Telephone Encounter (Signed)
Caller: Vannesa/Patient; PCP: Eleonore Chiquito; CB#: [phone];  Call regarding Has A Cast on her L arm from broken L wrist 06/11/12 and [facility] on this arm.  It itches intermittently.   Had been using Calamine lotion. Was inst to call ofc to see if med can be given for poison oak under L cast.  Aslo has it on R right arm and ankles.  Poision oak started 06/09/12.  Pt has not taken any PO meds like Benadryl.  Triaged Poison [place], [place] or [address] and  all emergent SX R/O.   Disp = needs to be seen in 24 hrs.   Offed appt with Dr. Kirtland Bouchard and pt refused.  States she will just try and Bendryl and will see the ortho Dr again  06/19/12.   Home care and call back inst given.

## 2012-06-15 NOTE — Telephone Encounter (Signed)
Forward to infrom

## 2012-06-15 NOTE — Telephone Encounter (Signed)
Please call triamcinolone cream 0.1% 30 g use twice a day to affected areas

## 2012-07-08 ENCOUNTER — Other Ambulatory Visit: Payer: Self-pay | Admitting: Family Medicine

## 2012-07-17 ENCOUNTER — Other Ambulatory Visit: Payer: Self-pay

## 2012-07-17 MED ORDER — CELECOXIB 200 MG PO CAPS: 200.0000 mg | ORAL_CAPSULE | Freq: Two times a day (BID) | ORAL | Status: DC

## 2012-08-31 ENCOUNTER — Ambulatory Visit (INDEPENDENT_AMBULATORY_CARE_PROVIDER_SITE_OTHER): Payer: 59

## 2012-08-31 DIAGNOSIS — Z23 Encounter for immunization: Secondary | ICD-10-CM

## 2012-09-12 ENCOUNTER — Other Ambulatory Visit: Payer: Self-pay | Admitting: Internal Medicine

## 2012-09-12 NOTE — Telephone Encounter (Signed)
Called in.

## 2012-09-12 NOTE — Telephone Encounter (Signed)
Suggest rov  OK #50

## 2012-09-12 NOTE — Telephone Encounter (Signed)
Last seen 06/06/12 - insect bite Last written 06/13/12 # 90 2RF Please advise

## 2012-10-05 ENCOUNTER — Other Ambulatory Visit (INDEPENDENT_AMBULATORY_CARE_PROVIDER_SITE_OTHER): Payer: 59

## 2012-10-05 DIAGNOSIS — Z0000 Encounter for general adult medical examination without abnormal findings: Secondary | ICD-10-CM

## 2012-10-05 LAB — CBC WITH DIFFERENTIAL/PLATELET
Basophils Relative: 0.2 % (ref 0.0–3.0)
HCT: 42.8 % (ref 36.0–46.0)
Hemoglobin: 14.6 g/dL (ref 12.0–15.0)
Lymphocytes Relative: 28.7 % (ref 12.0–46.0)
Lymphs Abs: 1.7 10*3/uL (ref 0.7–4.0)
MCHC: 34 g/dL (ref 30.0–36.0)
Monocytes Relative: 7.5 % (ref 3.0–12.0)
Neutro Abs: 3.7 10*3/uL (ref 1.4–7.7)
RBC: 4.26 Mil/uL (ref 3.87–5.11)

## 2012-10-05 LAB — TSH: TSH: 0.74 u[IU]/mL (ref 0.35–5.50)

## 2012-10-05 LAB — LDL CHOLESTEROL, DIRECT: Direct LDL: 154 mg/dL

## 2012-10-05 LAB — HEPATIC FUNCTION PANEL
ALT: 17 U/L (ref 0–35)
AST: 23 U/L (ref 0–37)
Total Protein: 6.8 g/dL (ref 6.0–8.3)

## 2012-10-05 LAB — BASIC METABOLIC PANEL
BUN: 17 mg/dL (ref 6–23)
CO2: 25 mEq/L (ref 19–32)
Calcium: 9 mg/dL (ref 8.4–10.5)
Chloride: 105 mEq/L (ref 96–112)
Creatinine, Ser: 0.8 mg/dL (ref 0.4–1.2)

## 2012-10-05 LAB — POCT URINALYSIS DIPSTICK
Blood, UA: NEGATIVE
Ketones, UA: NEGATIVE
Protein, UA: NEGATIVE
Spec Grav, UA: 1.02
Urobilinogen, UA: 0.2
pH, UA: 6.5

## 2012-10-05 LAB — LIPID PANEL
HDL: 43.4 mg/dL (ref >39.00)
Triglycerides: 134 mg/dL (ref 0.0–149.0)
VLDL: 26.8 mg/dL (ref 0.0–40.0)

## 2012-10-11 ENCOUNTER — Other Ambulatory Visit: Payer: Self-pay | Admitting: Internal Medicine

## 2012-10-12 ENCOUNTER — Encounter: Payer: Self-pay | Admitting: Internal Medicine

## 2012-10-12 ENCOUNTER — Ambulatory Visit (INDEPENDENT_AMBULATORY_CARE_PROVIDER_SITE_OTHER): Payer: 59 | Admitting: Internal Medicine

## 2012-10-12 VITALS — BP 130/80 | HR 88 | Temp 97.7°F | Resp 20 | Ht 65.0 in | Wt 128.0 lb

## 2012-10-12 DIAGNOSIS — Z0000 Encounter for general adult medical examination without abnormal findings: Secondary | ICD-10-CM

## 2012-10-12 DIAGNOSIS — M129 Arthropathy, unspecified: Secondary | ICD-10-CM

## 2012-10-12 DIAGNOSIS — J449 Chronic obstructive pulmonary disease, unspecified: Secondary | ICD-10-CM

## 2012-10-12 MED ORDER — CELECOXIB 200 MG PO CAPS: 200.0000 mg | ORAL_CAPSULE | Freq: Two times a day (BID) | ORAL | Status: DC

## 2012-10-12 MED ORDER — PANTOPRAZOLE SODIUM 40 MG PO TBEC: 40.0000 mg | DELAYED_RELEASE_TABLET | Freq: Every day | ORAL | Status: DC

## 2012-10-12 MED ORDER — HYDROCODONE-ACETAMINOPHEN 7.5-500 MG PO TABS: 1.0000 | ORAL_TABLET | Freq: Three times a day (TID) | ORAL | Status: DC | PRN

## 2012-10-12 MED ORDER — ALBUTEROL SULFATE HFA 108 (90 BASE) MCG/ACT IN AERS: 2.0000 | INHALATION_SPRAY | Freq: Four times a day (QID) | RESPIRATORY_TRACT | Status: DC | PRN

## 2012-10-12 NOTE — Progress Notes (Signed)
Subjective:    Patient ID: Lindsey Rowland, female    DOB: August 07, 1951, 61 y.o.   MRN: [id number]  HPI 61 year-old patient who is a former patient of Dr. Charmian Muff. She presents today for an annual health examinationnt. She does have a history of COPD and on going low-grade tobacco use. She smokes 5-6 cigarettes per day. She does have albuterol rescue medication that has not been required. Medical records reviewed. She does have a history of osteoarthritis and  has been  on Boniva for osteoporosis. She has remote history of ulcer disease and uses Protonix. She has hypertension controlled with Zestril.   family history- both parents died in their late 82s father had atrial fibrillation and dementia. Mother had COPD and died of complications from a fractured hip. One brother is well  Past Medical History  Diagnosis Date  . Arthritis   . COPD (chronic obstructive pulmonary disease)   . Ulcer   . Headache   . Polio     History   Social History  . Marital Status: Married    Spouse Name: N/A    Number of Children: N/A  . Years of Education: N/A   Occupational History  . Not on file.   Social History Main Topics  . Smoking status: Current Every Day Smoker  . Smokeless tobacco: Never Used     Comment: pt uses electronic cigarettes   . Alcohol Use: Yes  . Drug Use: Yes    Special: Hydrocodone  . Sexually Active: No   Other Topics Concern  . Not on file   Social History Narrative  . No narrative on file    Past Surgical History  Procedure Date  . Appendectomy   . Tonsillectomy   . Retinal detachment surgery 12/2010    right eye    No family history on file.  No Known Allergies  Current Outpatient Prescriptions on File Prior to Visit  Medication Sig Dispense Refill  . albuterol (PROAIR HFA) 108 (90 BASE) MCG/ACT inhaler Inhale 2 puffs into the lungs every 6 (six) hours as needed. For shortness of breath.      . Aspirin-Salicylamide-Caffeine (BC HEADACHE POWDER PO)  Take 1 packet by mouth daily as needed. For pain.      . celecoxib (CELEBREX) 200 MG capsule Take 1 capsule (200 mg total) by mouth 2 (two) times daily.  60 capsule  5  . diazepam (VALIUM) 10 MG tablet Take 10 mg by mouth 3 (three) times daily.      . diphenoxylate-atropine (LOMOTIL) 2.5-0.025 MG per tablet Take 1 tablet by mouth 4 (four) times daily as needed. For diarrhea.      Marland Kitchen HYDROcodone-acetaminophen (LORTAB) 7.5-500 MG per tablet TAKE 1 TABLET EVERY 6 HOURS AS NEEDED FOR PAIN  50 tablet  0  . hyoscyamine (LEVBID) 0.375 MG 12 hr tablet Take 0.375 mg by mouth every 12 (twelve) hours as needed. For irritable bowel symptoms.      . meclizine (ANTIVERT) 25 MG tablet Take 25 mg by mouth 3 (three) times daily as needed.        . Multiple Vitamin (MULITIVITAMIN WITH MINERALS) TABS Take 1 tablet by mouth daily.      . pantoprazole (PROTONIX) 40 MG tablet Take 40 mg by mouth daily.      . Pseudoephedrine-Guaifenesin (MUCINEX D) [phone] MG TB12 Take 1 tablet by mouth 2 (two) times daily as needed. For cough and cold.      . triamcinolone cream (KENALOG) 0.1 %  Apply topically 2 (two) times daily.  454 g  0    BP 130/80  Pulse 88  Temp 97.7 F (36.5 C) (Oral)  Resp 20  Ht 5\' 5"  (1.651 m)  Wt 128 lb (58.06 kg)  BMI 21.30 kg/m2  SpO2 97%    Review of Systems  Constitutional: Positive for fatigue.  HENT: Positive for congestion and sinus pressure. Negative for hearing loss, sore throat, rhinorrhea, dental problem and tinnitus.   Eyes: Negative for pain, discharge and visual disturbance.  Respiratory: Positive for cough. Negative for shortness of breath.   Cardiovascular: Negative for chest pain, palpitations and leg swelling.  Gastrointestinal: Negative for nausea, vomiting, abdominal pain, diarrhea, constipation, blood in stool and abdominal distention.  Genitourinary: Negative for dysuria, urgency, frequency, hematuria, flank pain, vaginal bleeding, vaginal discharge, difficulty urinating,  vaginal pain and pelvic pain.  Musculoskeletal: Positive for arthralgias. Negative for joint swelling and gait problem.  Skin: Negative for rash.  Neurological: Negative for dizziness, syncope, speech difficulty, weakness, numbness and headaches.  Hematological: Negative for adenopathy.  Psychiatric/Behavioral: Negative for behavioral problems, dysphoric mood and agitation. The patient is not nervous/anxious.        Objective:   Physical Exam  Constitutional: She is oriented to person, place, and time. She appears well-developed and well-nourished. No distress.       Afebrile. Slightly hoarse blood pressure low normal  HENT:  Head: Normocephalic.  Right Ear: External ear normal.  Left Ear: External ear normal.  Mouth/Throat: Oropharynx is clear and moist.  Eyes: Conjunctivae normal and EOM are normal. Pupils are equal, round, and reactive to light.  Neck: Normal range of motion. Neck supple. No thyromegaly present.  Cardiovascular: Normal rate, regular rhythm and normal heart sounds.        Absent right dorsalis pedis pulse  Pulmonary/Chest: Effort normal and breath sounds normal.  Abdominal: Soft. Bowel sounds are normal. She exhibits no mass. There is no tenderness.  Musculoskeletal: Normal range of motion.       Surgical scars both feet from surgery related to polio at age 63  Lymphadenopathy:    She has no cervical adenopathy.  Neurological: She is alert and oriented to person, place, and time.  Skin: Skin is warm and dry. No rash noted.  Psychiatric: She has a normal mood and affect. Her behavior is normal.          Assessment & Plan:  Preventive health exam COPD . Total tobacco cessation encouraged Hypertension well controlled History of allergic rhinitis History of osteoarthritis  Medications updated

## 2012-10-12 NOTE — Patient Instructions (Signed)
Take a calcium supplement, plus [phone] units of vitamin D    It is important that you exercise regularly, at least 20 minutes 3 to 4 times per week.  If you develop chest pain or shortness of breath seek  medical attention.  Smoking tobacco is very bad for your health. You should stop smoking immediately.  Schedule your colonoscopy to help detect colon cancer.  Return in one year for follow-up

## 2013-01-03 ENCOUNTER — Telehealth: Payer: Self-pay | Admitting: Internal Medicine

## 2013-01-03 NOTE — Telephone Encounter (Signed)
Spoke to pt told her Meclizine it OTC now can just go to the pharmacy and ask the pharmacist per Dr. Tawanna Cooler. Pt verbalized understanding.

## 2013-01-03 NOTE — Telephone Encounter (Signed)
Antivertigo roof work all those products now her OTC

## 2013-01-03 NOTE — Telephone Encounter (Signed)
Patient Information:  Caller Name: Zarahi  Phone: [phone]  Patient: Lindsey Rowland, Lindsey Rowland  Gender: Female  DOB: Apr 04, 1951  Age: 62 Years  PCP: Eleonore Chiquito [facility])  Office Follow Up:  Does the office need to follow up with this patient?: Yes  Instructions For The Office: Patient was seen in the office 11/08 /13.  Last script for meclazine  expired 03/04/2011.  Patient would prefer to try Meclazine new script . She is currently at work with dizziness.  Please contact patient [phone].   [facility] [phone]  RN Note:  Patient was seen in the office 11/08 /13.  Last script for meclazine  expired 03/04/2011.  Patient would prefer to try Meclazine new script . She is currently at work with dizziness.  Please contact patient [phone].   [facility] [phone]  Symptoms  Reason For Call & Symptoms: Patient states she is having Vertigo.  She has an expired prescription for meclazine.  Dizziness worse with head moving  Reviewed Health History In EMR: Yes  Reviewed Medications In EMR: Yes  Reviewed Allergies In EMR: Yes  Reviewed Surgeries / Procedures: No  Date of Onset of Symptoms: 01/01/2013  Guideline(s) Used:  Dizziness  Disposition Per Guideline:   Go to Office Now  Reason For Disposition Reached:   Spinning or tilting sensation (vertigo) present now  Advice Given:  Drink Fluids:  Drink several glasses of fruit juice, other clear fluids, or water. This will improve hydration and blood glucose. If you have a fever or have had heat exposure, make sure the fluids are cold.  Stand Up Slowly:  In the mornings, sit up for a few minutes before you stand up. That will help your blood flow make the adjustment.  If you have to stand up for long periods of time, contract and relax your leg muscles to help pump the blood back to the heart.  Sit down or lie down if you feel dizzy.  Patient Refused Recommendation:  Patient Requests Prescription  Patient was  seen in the office 11/08 /13.  Last script for meclazine  expired 03/04/2011.  Patient would prefer to try Meclazine new script . She is currently at work with dizziness.  Please contact patient [phone].   [facility] [phone]

## 2013-02-05 ENCOUNTER — Other Ambulatory Visit: Payer: Self-pay | Admitting: Internal Medicine

## 2013-02-06 ENCOUNTER — Other Ambulatory Visit: Payer: Self-pay | Admitting: *Deleted

## 2013-02-06 MED ORDER — HYDROCODONE-ACETAMINOPHEN 7.5-325 MG PO TABS: 1.0000 | ORAL_TABLET | Freq: Three times a day (TID) | ORAL | Status: DC | PRN

## 2013-02-06 NOTE — Telephone Encounter (Signed)
Rx called in to pharmacy. 

## 2013-03-26 ENCOUNTER — Other Ambulatory Visit: Payer: Self-pay | Admitting: Family Medicine

## 2013-04-02 ENCOUNTER — Telehealth: Payer: Self-pay | Admitting: *Deleted

## 2013-04-02 NOTE — Telephone Encounter (Signed)
ok 

## 2013-04-02 NOTE — Telephone Encounter (Signed)
Received request from pharmacy for refill on Flexeril, okay to refill?

## 2013-04-03 MED ORDER — CYCLOBENZAPRINE HCL 10 MG PO TABS: 10.0000 mg | ORAL_TABLET | Freq: Three times a day (TID) | ORAL | Status: DC | PRN

## 2013-04-08 ENCOUNTER — Other Ambulatory Visit: Payer: Self-pay | Admitting: Internal Medicine

## 2013-04-18 ENCOUNTER — Other Ambulatory Visit: Payer: Self-pay | Admitting: Internal Medicine

## 2013-06-05 ENCOUNTER — Other Ambulatory Visit: Payer: Self-pay | Admitting: Internal Medicine

## 2013-07-17 ENCOUNTER — Other Ambulatory Visit: Payer: Self-pay | Admitting: Internal Medicine

## 2013-08-06 ENCOUNTER — Other Ambulatory Visit: Payer: Self-pay | Admitting: Internal Medicine

## 2013-10-07 ENCOUNTER — Other Ambulatory Visit (INDEPENDENT_AMBULATORY_CARE_PROVIDER_SITE_OTHER): Payer: 59

## 2013-10-07 DIAGNOSIS — Z0000 Encounter for general adult medical examination without abnormal findings: Secondary | ICD-10-CM

## 2013-10-07 LAB — POCT URINALYSIS DIPSTICK
Bilirubin, UA: NEGATIVE
Blood, UA: NEGATIVE
Ketones, UA: NEGATIVE
Leukocytes, UA: NEGATIVE
pH, UA: 5.5

## 2013-10-07 LAB — BASIC METABOLIC PANEL
BUN: 19 mg/dL (ref 6–23)
Creatinine, Ser: 0.9 mg/dL (ref 0.4–1.2)
GFR: 67.17 mL/min (ref >60.00)
Potassium: 3.6 mEq/L (ref 3.5–5.1)

## 2013-10-07 LAB — LIPID PANEL
HDL: 55.8 mg/dL (ref >39.00)
VLDL: 23.2 mg/dL (ref 0.0–40.0)

## 2013-10-07 LAB — CBC WITH DIFFERENTIAL/PLATELET
Eosinophils Relative: 1.7 % (ref 0.0–5.0)
Lymphocytes Relative: 32.9 % (ref 12.0–46.0)
Monocytes Relative: 7.2 % (ref 3.0–12.0)
Neutrophils Relative %: 57.8 % (ref 43.0–77.0)
Platelets: 259 10*3/uL (ref 150.0–400.0)
WBC: 5.8 10*3/uL (ref 4.5–10.5)

## 2013-10-07 LAB — HEPATIC FUNCTION PANEL
ALT: 17 U/L (ref 0–35)
AST: 20 U/L (ref 0–37)
Bilirubin, Direct: 0 mg/dL (ref 0.0–0.3)
Total Bilirubin: 0.6 mg/dL (ref 0.3–1.2)

## 2013-10-14 ENCOUNTER — Encounter: Payer: Self-pay | Admitting: Internal Medicine

## 2013-10-14 ENCOUNTER — Ambulatory Visit (INDEPENDENT_AMBULATORY_CARE_PROVIDER_SITE_OTHER): Payer: 59 | Admitting: Internal Medicine

## 2013-10-14 VITALS — BP 120/80 | HR 84 | Temp 97.9°F | Resp 20 | Ht 65.0 in | Wt 125.0 lb

## 2013-10-14 DIAGNOSIS — Z0000 Encounter for general adult medical examination without abnormal findings: Secondary | ICD-10-CM

## 2013-10-14 DIAGNOSIS — I10 Essential (primary) hypertension: Secondary | ICD-10-CM

## 2013-10-14 DIAGNOSIS — M129 Arthropathy, unspecified: Secondary | ICD-10-CM

## 2013-10-14 DIAGNOSIS — F17200 Nicotine dependence, unspecified, uncomplicated: Secondary | ICD-10-CM

## 2013-10-14 DIAGNOSIS — J449 Chronic obstructive pulmonary disease, unspecified: Secondary | ICD-10-CM

## 2013-10-14 MED ORDER — CELECOXIB 200 MG PO CAPS: 200.0000 mg | ORAL_CAPSULE | Freq: Two times a day (BID) | ORAL | Status: DC

## 2013-10-14 MED ORDER — HYDROCODONE-ACETAMINOPHEN 7.5-325 MG PO TABS: ORAL_TABLET | ORAL | Status: DC

## 2013-10-14 MED ORDER — DIPHENOXYLATE-ATROPINE 2.5-0.025 MG PO TABS: 1.0000 | ORAL_TABLET | Freq: Four times a day (QID) | ORAL | Status: DC | PRN

## 2013-10-14 MED ORDER — ALBUTEROL SULFATE HFA 108 (90 BASE) MCG/ACT IN AERS: 2.0000 | INHALATION_SPRAY | Freq: Four times a day (QID) | RESPIRATORY_TRACT | Status: DC | PRN

## 2013-10-14 MED ORDER — PANTOPRAZOLE SODIUM 40 MG PO TBEC: 40.0000 mg | DELAYED_RELEASE_TABLET | Freq: Every day | ORAL | Status: DC

## 2013-10-14 NOTE — Patient Instructions (Signed)
Limit your sodium (Salt) intake  Smoking tobacco is very bad for your health. You should stop smoking immediately.  It  is important that you exercise regularly, at least 20 minutes 3 to 4 times per week.  If you develop chest pain or shortness of breath seek  medical attention.  Schedule your colonoscopy to help detect colon cancer.  Return in one year for follow-up

## 2013-10-14 NOTE — Progress Notes (Signed)
Subjective:    Patient ID: Lindsey Rowland, female    DOB: 03/24/1951, 62 y.o.   MRN: [id number]  HPI Pre-visit discussion using our clinic review tool. No additional management support is needed unless otherwise documented below in the visit note.   62 year-old patient who is a former patient of Dr. Charmian Muff. She presents today for an annual health examinationnt. She does have a history of COPD and on going low-grade tobacco use. She smokes 5-6 cigarettes per day. She does have albuterol rescue medication that has not been required. Medical records reviewed. She does have a history of osteoarthritis and  has been  on Boniva for osteoporosis. She has remote history of ulcer disease and uses Protonix. She has hypertension controlled with Zestril.  She receives annual [facility]ologic care in [facility] Her last colonoscopy was in 2003 by Dr. Virginia Rochester     family history- both parents died in their late 29s father had atrial fibrillation and dementia. Mother had COPD and died of complications from a fractured hip. One brother is well  Past Medical History  Diagnosis Date  . Arthritis   . COPD (chronic obstructive pulmonary disease)   . Ulcer   . Headache(784.0)   . Polio     History   Social History  . Marital Status: Married    Spouse Name: N/A    Number of Children: N/A  . Years of Education: N/A   Occupational History  . Not on file.   Social History Main Topics  . Smoking status: Current Every Day Smoker  . Smokeless tobacco: Never Used     Comment: pt uses electronic cigarettes   . Alcohol Use: Yes  . Drug Use: Yes    Special: Hydrocodone  . Sexual Activity: No   Other Topics Concern  . Not on file   Social History Narrative  . No narrative on file    Past Surgical History  Procedure Laterality Date  . Appendectomy    . Tonsillectomy    . Retinal detachment surgery  12/2010    right eye    History reviewed. No pertinent family history.  No Known  Allergies  Current Outpatient Prescriptions on File Prior to Visit  Medication Sig Dispense Refill  . Aspirin-Salicylamide-Caffeine (BC HEADACHE POWDER PO) Take 1 packet by mouth daily as needed. For pain.      . cyclobenzaprine (FLEXERIL) 10 MG tablet Take 1 tablet (10 mg total) by mouth 3 (three) times daily as needed for muscle spasms.  90 tablet  1  . diazepam (VALIUM) 10 MG tablet TAKE 1 TABLET AT BEDTIME FOR RESTLESS LEGS  30 tablet  5  . hyoscyamine (LEVBID) 0.375 MG 12 hr tablet Take 0.375 mg by mouth every 12 (twelve) hours as needed. For irritable bowel symptoms.      . meclizine (ANTIVERT) 25 MG tablet Take 25 mg by mouth 3 (three) times daily as needed.        . Multiple Vitamin (MULITIVITAMIN WITH MINERALS) TABS Take 1 tablet by mouth daily.       No current facility-administered medications on file prior to visit.    BP 120/80  Pulse 84  Temp(Src) 97.9 F (36.6 C) (Oral)  Resp 20  Ht 5\' 5"  (1.651 m)  Wt 125 lb (56.7 kg)  BMI 20.80 kg/m2  SpO2 98%    Review of Systems  Constitutional: Positive for fatigue.  HENT: Positive for congestion and sinus pressure. Negative for dental problem, hearing loss, rhinorrhea, sore  throat and tinnitus.   Eyes: Negative for pain, discharge and visual disturbance.  Respiratory: Positive for cough. Negative for shortness of breath.   Cardiovascular: Negative for chest pain, palpitations and leg swelling.  Gastrointestinal: Negative for nausea, vomiting, abdominal pain, diarrhea, constipation, blood in stool and abdominal distention.  Genitourinary: Negative for dysuria, urgency, frequency, hematuria, flank pain, vaginal bleeding, vaginal discharge, difficulty urinating, vaginal pain and pelvic pain.  Musculoskeletal: Positive for arthralgias. Negative for gait problem and joint swelling.  Skin: Negative for rash.  Neurological: Negative for dizziness, syncope, speech difficulty, weakness, numbness and headaches.  Hematological: Negative  for adenopathy.  Psychiatric/Behavioral: Negative for behavioral problems, dysphoric mood and agitation. The patient is not nervous/anxious.        Objective:   Physical Exam  Constitutional: She is oriented to person, place, and time. She appears well-developed and well-nourished. No distress.  Afebrile.  blood pressure normal  HENT:  Head: Normocephalic.  Right Ear: External ear normal.  Left Ear: External ear normal.  Mouth/Throat: Oropharynx is clear and moist.  Eyes: Conjunctivae and EOM are normal. Pupils are equal, round, and reactive to light.  Neck: Normal range of motion. Neck supple. No thyromegaly present.  Cardiovascular: Normal rate, regular rhythm and normal heart sounds.   Absent right dorsalis pedis pulse  Pulmonary/Chest: Effort normal and breath sounds normal.  Abdominal: Soft. Bowel sounds are normal. She exhibits no mass. There is no tenderness.  Musculoskeletal: Normal range of motion.  Surgical scars both feet from surgery related to polio at age 50  Lymphadenopathy:    She has no cervical adenopathy.  Neurological: She is alert and oriented to person, place, and time.  Skin: Skin is warm and dry. No rash noted.  Psychiatric: She has a normal mood and affect. Her behavior is normal.          Assessment & Plan:  Preventive health exam COPD . Total tobacco cessation encouraged Hypertension well controlled History of allergic rhinitis History of osteoarthritis  Medications updated

## 2013-10-15 ENCOUNTER — Other Ambulatory Visit: Payer: Self-pay | Admitting: Internal Medicine

## 2013-11-15 ENCOUNTER — Telehealth: Payer: Self-pay | Admitting: Internal Medicine

## 2013-11-15 ENCOUNTER — Encounter: Payer: Self-pay | Admitting: Internal Medicine

## 2013-11-15 MED ORDER — HYDROCODONE-ACETAMINOPHEN 7.5-325 MG PO TABS: ORAL_TABLET | ORAL | Status: DC

## 2013-11-15 NOTE — Telephone Encounter (Signed)
Left message Rx ready for pickup, will be at the front desk. Rx printed and signed, put at front desk.

## 2013-11-15 NOTE — Telephone Encounter (Signed)
Pt request refill HYDROcodone-acetaminophen (NORCO) 7.5-325 MG per tablet °

## 2013-11-24 ENCOUNTER — Other Ambulatory Visit: Payer: Self-pay | Admitting: Internal Medicine

## 2013-12-17 ENCOUNTER — Telehealth: Payer: Self-pay | Admitting: Internal Medicine

## 2013-12-17 MED ORDER — HYDROCODONE-ACETAMINOPHEN 7.5-325 MG PO TABS: ORAL_TABLET | ORAL | Status: DC

## 2013-12-17 NOTE — Telephone Encounter (Signed)
Spoke to pt told her Rx ready for pick up will be at the front desk. Pt verbalized understanding. Rx printed and signed, put at front desk.

## 2013-12-17 NOTE — Telephone Encounter (Signed)
Pt needs new rx hydrocodone °

## 2014-01-16 ENCOUNTER — Other Ambulatory Visit: Payer: Self-pay | Admitting: Internal Medicine

## 2014-01-16 ENCOUNTER — Telehealth: Payer: Self-pay | Admitting: Internal Medicine

## 2014-01-16 MED ORDER — HYDROCODONE-ACETAMINOPHEN 7.5-325 MG PO TABS
ORAL_TABLET | ORAL | Status: DC
Start: 2014-01-16 — End: 2014-02-17

## 2014-01-16 NOTE — Telephone Encounter (Signed)
Pt needs new rx hydrocodone °

## 2014-01-16 NOTE — Telephone Encounter (Signed)
Spoke to pt told her Rx will be ready tomorrow for pickup. Pt verbalized understanding. Rx printed and signed, put at front desk.

## 2014-02-17 ENCOUNTER — Telehealth: Payer: Self-pay | Admitting: Internal Medicine

## 2014-02-17 MED ORDER — HYDROCODONE-ACETAMINOPHEN 7.5-325 MG PO TABS: ORAL_TABLET | ORAL | Status: DC

## 2014-02-17 NOTE — Telephone Encounter (Signed)
Yes/yes

## 2014-02-17 NOTE — Telephone Encounter (Signed)
Spoke to pt told her Rx for Hydrocodone is ready but, will need to go to lab when she comes in first to sign controlled substance contact and give urine for drug screen. Pt verbalized understanding. Rx printed and signed, put at front desk.

## 2014-02-17 NOTE — Telephone Encounter (Signed)
Pt needs new rx hydrocodone °

## 2014-02-17 NOTE — Telephone Encounter (Signed)
Pt calling for refill on Hydrocodone, needs to sign contract, do you want UDS?

## 2014-02-21 ENCOUNTER — Encounter: Payer: Self-pay | Admitting: *Deleted

## 2014-03-20 ENCOUNTER — Telehealth: Payer: Self-pay | Admitting: Internal Medicine

## 2014-03-20 MED ORDER — HYDROCODONE-ACETAMINOPHEN 7.5-325 MG PO TABS: ORAL_TABLET | ORAL | Status: DC

## 2014-03-20 NOTE — Telephone Encounter (Signed)
Pt notified Rx ready for pickup, will be at the front desk. Rx printed and signed.

## 2014-03-20 NOTE — Telephone Encounter (Signed)
Pt req rx HYDROcodone-acetaminophen (NORCO) 7.5-325 MG per tablet

## 2014-04-17 ENCOUNTER — Telehealth: Payer: Self-pay | Admitting: Internal Medicine

## 2014-04-17 MED ORDER — HYDROCODONE-ACETAMINOPHEN 7.5-325 MG PO TABS: ORAL_TABLET | ORAL | Status: DC

## 2014-04-17 NOTE — Telephone Encounter (Signed)
Pt notified Rx ready for pickup. Rx printed and signed.  

## 2014-04-17 NOTE — Telephone Encounter (Signed)
Pt requesting re-fill on HYDROcodone-acetaminophen (NORCO) 7.5-325 MG per tablet

## 2014-04-23 ENCOUNTER — Other Ambulatory Visit: Payer: Self-pay | Admitting: Internal Medicine

## 2014-05-19 ENCOUNTER — Other Ambulatory Visit: Payer: Self-pay | Admitting: Internal Medicine

## 2014-05-20 ENCOUNTER — Telehealth: Payer: Self-pay | Admitting: Internal Medicine

## 2014-05-20 MED ORDER — HYDROCODONE-ACETAMINOPHEN 7.5-325 MG PO TABS: ORAL_TABLET | ORAL | Status: DC

## 2014-05-20 NOTE — Telephone Encounter (Signed)
Pt notified Rx ready for pickup. Rx prnted and signed.

## 2014-05-20 NOTE — Telephone Encounter (Signed)
Pt request rx  HYDROcodone-acetaminophen (NORCO) 7.5-325 MG per table

## 2014-06-18 ENCOUNTER — Telehealth: Payer: Self-pay | Admitting: Internal Medicine

## 2014-06-18 NOTE — Telephone Encounter (Signed)
Pt request new rx HYDROcodone-acetaminophen (NORCO) 7.5-325 MG per tablet

## 2014-06-19 MED ORDER — HYDROCODONE-ACETAMINOPHEN 7.5-325 MG PO TABS: ORAL_TABLET | ORAL | Status: DC

## 2014-06-19 NOTE — Telephone Encounter (Signed)
Pt notified Rx ready for pickup. Rx printed and signed.  

## 2014-07-14 ENCOUNTER — Other Ambulatory Visit: Payer: Self-pay | Admitting: Internal Medicine

## 2014-07-17 ENCOUNTER — Telehealth: Payer: Self-pay | Admitting: Internal Medicine

## 2014-07-17 NOTE — Telephone Encounter (Signed)
Pt needs new rx hydrocodone. Pt is aware md out of office

## 2014-07-21 MED ORDER — HYDROCODONE-ACETAMINOPHEN 7.5-325 MG PO TABS: ORAL_TABLET | ORAL | Status: DC

## 2014-07-21 NOTE — Telephone Encounter (Signed)
Pt notified Rx ready for pickup. Rx printed and signed.  

## 2014-08-18 ENCOUNTER — Telehealth: Payer: Self-pay | Admitting: Internal Medicine

## 2014-08-18 MED ORDER — HYDROCODONE-ACETAMINOPHEN 7.5-325 MG PO TABS: ORAL_TABLET | ORAL | Status: DC

## 2014-08-18 NOTE — Telephone Encounter (Signed)
Pt requesting refill of HYDROcodone-acetaminophen (NORCO) 7.5-325 MG per tablet ° °

## 2014-08-18 NOTE — Telephone Encounter (Signed)
Pt stopped by office for Rx for Hydrocodone, pt is out. Urine drug screen done and Rx printed and signed and given to pt.

## 2014-09-16 ENCOUNTER — Telehealth: Payer: Self-pay | Admitting: Internal Medicine

## 2014-09-16 NOTE — Telephone Encounter (Signed)
Pt has made appt. 

## 2014-09-16 NOTE — Telephone Encounter (Signed)
Pt needs new rx hydrocodone °

## 2014-09-16 NOTE — Telephone Encounter (Signed)
Lindsey Rowland, please call pt and schedule appointment to discuss medication Hydrocodone per Dr. Raliegh Ip this week.

## 2014-09-17 ENCOUNTER — Encounter: Payer: Self-pay | Admitting: Internal Medicine

## 2014-09-17 ENCOUNTER — Ambulatory Visit (INDEPENDENT_AMBULATORY_CARE_PROVIDER_SITE_OTHER): Payer: 59 | Admitting: Internal Medicine

## 2014-09-17 VITALS — BP 136/80 | HR 98 | Temp 98.2°F | Resp 20 | Ht 65.0 in | Wt 122.0 lb

## 2014-09-17 DIAGNOSIS — Z0000 Encounter for general adult medical examination without abnormal findings: Secondary | ICD-10-CM

## 2014-09-17 DIAGNOSIS — F411 Generalized anxiety disorder: Secondary | ICD-10-CM

## 2014-09-17 DIAGNOSIS — M129 Arthropathy, unspecified: Secondary | ICD-10-CM

## 2014-09-17 DIAGNOSIS — I10 Essential (primary) hypertension: Secondary | ICD-10-CM

## 2014-09-17 NOTE — Progress Notes (Signed)
Subjective:    Patient ID: Lindsey Rowland, female    DOB: 18-Aug-1951, 63 y.o.   MRN: [id number]  HPI   63 year old patient who is seen today for followup.  She has a history of COPD and low volume tobacco use.  She has osteoarthritis, as well as restless leg syndrome.  She uses both Valium and hydrocodone.  Several times per week.  A recent urine drug screen has been negative for active metabolites.  Past Medical History  Diagnosis Date  . Arthritis   . COPD (chronic obstructive pulmonary disease)   . Ulcer   . Headache(784.0)   . Polio     History   Social History  . Marital Status: Married    Spouse Name: N/A    Number of Children: N/A  . Years of Education: N/A   Occupational History  . Not on file.   Social History Main Topics  . Smoking status: Current Every Day Smoker  . Smokeless tobacco: Never Used     Comment: pt uses electronic cigarettes   . Alcohol Use: Yes  . Drug Use: Yes    Special: Hydrocodone  . Sexual Activity: No   Other Topics Concern  . Not on file   Social History Narrative  . No narrative on file    Past Surgical History  Procedure Laterality Date  . Appendectomy    . Tonsillectomy    . Retinal detachment surgery  12/2010    right eye    History reviewed. No pertinent family history.  No Known Allergies  Current Outpatient Prescriptions on File Prior to Visit  Medication Sig Dispense Refill  . albuterol (PROAIR HFA) 108 (90 BASE) MCG/ACT inhaler Inhale 2 puffs into the lungs every 6 (six) hours as needed. For shortness of breath.  1 Inhaler  6  . Aspirin-Salicylamide-Caffeine (BC HEADACHE POWDER PO) Take 1 packet by mouth daily as needed. For pain.      Marland Kitchen CELEBREX 200 MG capsule TAKE ONE CAPSULE TWICE A DAY  60 capsule  5  . cyclobenzaprine (FLEXERIL) 10 MG tablet Take 1 tablet (10 mg total) by mouth 3 (three) times daily as needed for muscle spasms.  90 tablet  1  . diazepam (VALIUM) 10 MG tablet TAKE 1 TABLET AT BEDTIME  30  tablet  2  . diphenoxylate-atropine (LOMOTIL) 2.5-0.025 MG per tablet Take 1 tablet by mouth 4 (four) times daily as needed. For diarrhea.  30 tablet  2  . HYDROcodone-acetaminophen (NORCO) 7.5-325 MG per tablet TAKE 1 TABLET EVERY 8 HOURS AS NEEDED FOR PAIN  90 tablet  0  . hyoscyamine (LEVBID) 0.375 MG 12 hr tablet Take 0.375 mg by mouth every 12 (twelve) hours as needed. For irritable bowel symptoms.      . meclizine (ANTIVERT) 25 MG tablet Take 25 mg by mouth 3 (three) times daily as needed.        . Multiple Vitamin (MULITIVITAMIN WITH MINERALS) TABS Take 1 tablet by mouth daily.      . pantoprazole (PROTONIX) 40 MG tablet TAKE 1 TABLET (40 MG TOTAL) BY MOUTH DAILY.  90 tablet  2   No current facility-administered medications on file prior to visit.    BP 136/80  Pulse 98  Temp(Src) 98.2 F (36.8 C) (Oral)  Resp 20  Ht 5\' 5"  (1.651 m)  Wt 122 lb (55.339 kg)  BMI 20.30 kg/m2  SpO2 97%     Review of Systems  Constitutional: Negative.   HENT: Negative  for congestion, dental problem, hearing loss, rhinorrhea, sinus pressure, sore throat and tinnitus.   Eyes: Negative for pain, discharge and visual disturbance.  Respiratory: Negative for cough and shortness of breath.   Cardiovascular: Negative for chest pain, palpitations and leg swelling.  Gastrointestinal: Negative for nausea, vomiting, abdominal pain, diarrhea, constipation, blood in stool and abdominal distention.  Genitourinary: Negative for dysuria, urgency, frequency, hematuria, flank pain, vaginal bleeding, vaginal discharge, difficulty urinating, vaginal pain and pelvic pain.  Musculoskeletal: Positive for arthralgias and joint swelling. Negative for gait problem.  Skin: Negative for rash.  Neurological: Negative for dizziness, syncope, speech difficulty, weakness, numbness and headaches.  Hematological: Negative for adenopathy.  Psychiatric/Behavioral: Negative for behavioral problems, dysphoric mood and agitation. The  patient is not nervous/anxious.        Objective:   Physical Exam  Constitutional: She is oriented to person, place, and time. She appears well-developed and well-nourished.  HENT:  Head: Normocephalic.  Right Ear: External ear normal.  Left Ear: External ear normal.  Mouth/Throat: Oropharynx is clear and moist.  Eyes: Conjunctivae and EOM are normal. Pupils are equal, round, and reactive to light.  Neck: Normal range of motion. Neck supple. No thyromegaly present.  Cardiovascular: Normal rate, regular rhythm, normal heart sounds and intact distal pulses.   Pulmonary/Chest: Effort normal and breath sounds normal.  Abdominal: Soft. Bowel sounds are normal. She exhibits no mass. There is no tenderness.  Musculoskeletal: Normal range of motion.  Lymphadenopathy:    She has no cervical adenopathy.  Neurological: She is alert and oriented to person, place, and time.  Skin: Skin is warm and dry. No rash noted.  Psychiatric: She has a normal mood and affect. Her behavior is normal.          Assessment & Plan:   Mild COPD Hypertension stable Osteoarthritis  We'll recheck a urine drug screen  CPX as scheduled

## 2014-09-17 NOTE — Progress Notes (Signed)
Pre visit review using our clinic review tool, if applicable. No additional management support is needed unless otherwise documented below in the visit note. 

## 2014-09-17 NOTE — Patient Instructions (Signed)
cpx as scheduled

## 2014-09-18 ENCOUNTER — Telehealth: Payer: Self-pay | Admitting: Internal Medicine

## 2014-09-18 NOTE — Telephone Encounter (Signed)
emmi mailed  °

## 2014-10-22 ENCOUNTER — Other Ambulatory Visit (INDEPENDENT_AMBULATORY_CARE_PROVIDER_SITE_OTHER): Payer: 59

## 2014-10-22 DIAGNOSIS — Z0000 Encounter for general adult medical examination without abnormal findings: Secondary | ICD-10-CM

## 2014-10-22 LAB — BASIC METABOLIC PANEL
BUN: 11 mg/dL (ref 6–23)
CO2: 24 mEq/L (ref 19–32)
Calcium: 9.1 mg/dL (ref 8.4–10.5)
Chloride: 106 mEq/L (ref 96–112)
Creatinine, Ser: 0.7 mg/dL (ref 0.4–1.2)
GFR: 90.96 mL/min (ref >60.00)
Glucose, Bld: 87 mg/dL (ref 70–99)
Potassium: 4.5 mEq/L (ref 3.5–5.1)
Sodium: 141 mEq/L (ref 135–145)

## 2014-10-22 LAB — CBC WITH DIFFERENTIAL/PLATELET
BASOS ABS: 0 10*3/uL (ref 0.0–0.1)
BASOS PCT: 0.6 % (ref 0.0–3.0)
EOS ABS: 0.1 10*3/uL (ref 0.0–0.7)
Eosinophils Relative: 1.6 % (ref 0.0–5.0)
HCT: 43.8 % (ref 36.0–46.0)
Hemoglobin: 14.6 g/dL (ref 12.0–15.0)
LYMPHS PCT: 26.5 % (ref 12.0–46.0)
Lymphs Abs: 1.9 10*3/uL (ref 0.7–4.0)
MCHC: 33.5 g/dL (ref 30.0–36.0)
MCV: 100.4 fl — AB (ref 78.0–100.0)
MONO ABS: 0.5 10*3/uL (ref 0.1–1.0)
Monocytes Relative: 7.3 % (ref 3.0–12.0)
NEUTROS PCT: 64 % (ref 43.0–77.0)
Neutro Abs: 4.5 10*3/uL (ref 1.4–7.7)
PLATELETS: 267 10*3/uL (ref 150.0–400.0)
RBC: 4.36 Mil/uL (ref 3.87–5.11)
RDW: 13.8 % (ref 11.5–15.5)
WBC: 7 10*3/uL (ref 4.0–10.5)

## 2014-10-22 LAB — HEPATIC FUNCTION PANEL
ALT: 15 U/L (ref 0–35)
AST: 20 U/L (ref 0–37)
Albumin: 4.3 g/dL (ref 3.5–5.2)
Alkaline Phosphatase: 70 U/L (ref 39–117)
BILIRUBIN TOTAL: 0.8 mg/dL (ref 0.2–1.2)
Bilirubin, Direct: 0.1 mg/dL (ref 0.0–0.3)
TOTAL PROTEIN: 7 g/dL (ref 6.0–8.3)

## 2014-10-22 LAB — POCT URINALYSIS DIPSTICK
Bilirubin, UA: NEGATIVE
Glucose, UA: NEGATIVE
Ketones, UA: NEGATIVE
Nitrite, UA: NEGATIVE
Protein, UA: NEGATIVE
Spec Grav, UA: 1.025
Urobilinogen, UA: 0.2
pH, UA: 5.5

## 2014-10-22 LAB — LIPID PANEL
Cholesterol: 200 mg/dL (ref 0–200)
HDL: 49.8 mg/dL (ref >39.00)
LDL Cholesterol: 130 mg/dL — ABNORMAL HIGH (ref 0–99)
NonHDL: 150.2
TRIGLYCERIDES: 101 mg/dL (ref 0.0–149.0)
Total CHOL/HDL Ratio: 4
VLDL: 20.2 mg/dL (ref 0.0–40.0)

## 2014-10-22 LAB — TSH: TSH: 0.6 u[IU]/mL (ref 0.35–4.50)

## 2014-10-23 ENCOUNTER — Encounter: Payer: Self-pay | Admitting: Internal Medicine

## 2014-10-24 ENCOUNTER — Other Ambulatory Visit: Payer: 59

## 2014-10-27 ENCOUNTER — Ambulatory Visit (INDEPENDENT_AMBULATORY_CARE_PROVIDER_SITE_OTHER): Payer: 59 | Admitting: Internal Medicine

## 2014-10-27 ENCOUNTER — Encounter: Payer: Self-pay | Admitting: Internal Medicine

## 2014-10-27 VITALS — BP 120/70 | HR 85 | Temp 98.1°F | Resp 18 | Ht 65.0 in | Wt 121.0 lb

## 2014-10-27 DIAGNOSIS — J449 Chronic obstructive pulmonary disease, unspecified: Secondary | ICD-10-CM

## 2014-10-27 DIAGNOSIS — Z0000 Encounter for general adult medical examination without abnormal findings: Secondary | ICD-10-CM

## 2014-10-27 DIAGNOSIS — Z720 Tobacco use: Secondary | ICD-10-CM

## 2014-10-27 DIAGNOSIS — J3089 Other allergic rhinitis: Secondary | ICD-10-CM

## 2014-10-27 DIAGNOSIS — I10 Essential (primary) hypertension: Secondary | ICD-10-CM

## 2014-10-27 DIAGNOSIS — F17200 Nicotine dependence, unspecified, uncomplicated: Secondary | ICD-10-CM

## 2014-10-27 NOTE — Progress Notes (Signed)
Subjective:    Patient ID: Lindsey Rowland, female    DOB: 24-Jun-1951, 63 y.o.   MRN: [id number]  HPI Pre-visit discussion using our clinic review tool. No additional management support is needed unless otherwise documented below in the visit note.   63  year-old patient who is a former patient of Dr. Jerene Canny. She presents today for an annual health examinationnt. She does have a history of COPD and on going low-grade tobacco use. She smokes 5-6 cigarettes per day. She does have albuterol rescue medication that has not been required. Medical records reviewed. She does have a history of osteoarthritis and  has been  on Boniva for osteoporosis. She has remote history of ulcer disease and uses Protonix. She has hypertension controlled with Zestril.  She receives annual gynecologic care in [facility] Her last colonoscopy was in 2003 by Dr. Lajoyce Corners  She has been on hydrocodone and Valium and has received regular refills.  Her past. Two Urine drug screen have been negative for metabolites     family history- both parents died in their late 41s father had atrial fibrillation and dementia. Mother had COPD and died of complications from a fractured hip. One brother is well  Past Medical History  Diagnosis Date  . Arthritis   . COPD (chronic obstructive pulmonary disease)   . Ulcer   . Headache(784.0)   . Polio     History   Social History  . Marital Status: Married    Spouse Name: N/A    Number of Children: N/A  . Years of Education: N/A   Occupational History  . Not on file.   Social History Main Topics  . Smoking status: Current Every Day Smoker  . Smokeless tobacco: Never Used     Comment: pt uses electronic cigarettes   . Alcohol Use: Yes  . Drug Use: Yes    Special: Hydrocodone  . Sexual Activity: No   Other Topics Concern  . Not on file   Social History Narrative    Past Surgical History  Procedure Laterality Date  . Appendectomy    . Tonsillectomy    . Retinal  detachment surgery  12/2010    right eye    No family history on file.  No Known Allergies  Current Outpatient Prescriptions on File Prior to Visit  Medication Sig Dispense Refill  . albuterol (PROAIR HFA) 108 (90 BASE) MCG/ACT inhaler Inhale 2 puffs into the lungs every 6 (six) hours as needed. For shortness of breath. 1 Inhaler 6  . Aspirin-Salicylamide-Caffeine (BC HEADACHE POWDER PO) Take 1 packet by mouth daily as needed. For pain.    Marland Kitchen CELEBREX 200 MG capsule TAKE ONE CAPSULE TWICE A DAY 60 capsule 5  . cyclobenzaprine (FLEXERIL) 10 MG tablet Take 1 tablet (10 mg total) by mouth 3 (three) times daily as needed for muscle spasms. 90 tablet 1  . diazepam (VALIUM) 10 MG tablet TAKE 1 TABLET AT BEDTIME (Patient taking differently: TAKE 1 TABLET AT BEDTIME AS NEEDED) 30 tablet 2  . HYDROcodone-acetaminophen (NORCO) 7.5-325 MG per tablet TAKE 1 TABLET EVERY 8 HOURS AS NEEDED FOR PAIN 90 tablet 0  . hyoscyamine (LEVBID) 0.375 MG 12 hr tablet Take 0.375 mg by mouth every 12 (twelve) hours as needed. For irritable bowel symptoms.    . meclizine (ANTIVERT) 25 MG tablet Take 25 mg by mouth 3 (three) times daily as needed.      . Multiple Vitamin (MULITIVITAMIN WITH MINERALS) TABS Take 1 tablet by  mouth daily.    . pantoprazole (PROTONIX) 40 MG tablet TAKE 1 TABLET (40 MG TOTAL) BY MOUTH DAILY. 90 tablet 2   No current facility-administered medications on file prior to visit.    BP 120/70 mmHg  Pulse 85  Temp(Src) 98.1 F (36.7 C) (Oral)  Resp 18  Ht 5\' 5"  (1.651 m)  Wt 121 lb (54.885 kg)  BMI 20.14 kg/m2  SpO2 97%    Review of Systems  Constitutional: Positive for fatigue.  HENT: Positive for congestion and sinus pressure. Negative for dental problem, hearing loss, rhinorrhea, sore throat and tinnitus.   Eyes: Negative for pain, discharge and visual disturbance.  Respiratory: Positive for cough. Negative for shortness of breath.   Cardiovascular: Negative for chest pain,  palpitations and leg swelling.  Gastrointestinal: Negative for nausea, vomiting, abdominal pain, diarrhea, constipation, blood in stool and abdominal distention.  Genitourinary: Negative for dysuria, urgency, frequency, hematuria, flank pain, vaginal bleeding, vaginal discharge, difficulty urinating, vaginal pain and pelvic pain.  Musculoskeletal: Positive for arthralgias. Negative for joint swelling and gait problem.  Skin: Negative for rash.  Neurological: Negative for dizziness, syncope, speech difficulty, weakness, numbness and headaches.  Hematological: Negative for adenopathy.  Psychiatric/Behavioral: Negative for behavioral problems, dysphoric mood and agitation. The patient is not nervous/anxious.        Objective:   Physical Exam  Constitutional: She is oriented to person, place, and time. She appears well-developed and well-nourished.  HENT:  Head: Normocephalic and atraumatic.  Right Ear: External ear normal.  Left Ear: External ear normal.  Mouth/Throat: Oropharynx is clear and moist.  Eyes: Conjunctivae and EOM are normal.  Neck: Normal range of motion. Neck supple. No JVD present. No thyromegaly present.  Cardiovascular: Normal rate, regular rhythm, normal heart sounds and intact distal pulses.   No murmur heard. Decreased right dorsalis pedis pulse  Pulmonary/Chest: Effort normal and breath sounds normal. She has no wheezes. She has no rales.  Abdominal: Soft. Bowel sounds are normal. She exhibits no distension and no mass. There is no tenderness. There is no rebound and no guarding.  Genitourinary: Vagina normal.  Musculoskeletal: Normal range of motion. She exhibits no edema or tenderness.  Neurological: She is alert and oriented to person, place, and time. She has normal reflexes. No cranial nerve deficit. She exhibits normal muscle tone. Coordination normal.  Skin: Skin is warm and dry. No rash noted.  Psychiatric: She has a normal mood and affect. Her behavior is  normal.          Assessment & Plan:  Preventive health exam COPD . Total tobacco cessation encouraged Hypertension well controlled History of allergic rhinitis History of osteoarthritis IBS, stable.  Follow colonoscopy scheduled Medications updated.  This office.  We will not prescribe any further control drugs

## 2014-10-27 NOTE — Patient Instructions (Signed)
Schedule your colonoscopy to help detect colon cancer.    It is important that you exercise regularly, at least 20 minutes 3 to 4 times per week.  If you develop chest pain or shortness of breath seek  medical attention.  Smoking tobacco is very bad for your health. You should stop smoking immediately.  Health Maintenance Adopting a healthy lifestyle and getting preventive care can go a long way to promote health and wellness. Talk with your health care provider about what schedule of regular examinations is right for you. This is a good chance for you to check in with your provider about disease prevention and staying healthy. In between checkups, there are plenty of things you can do on your own. Experts have done a lot of research about which lifestyle changes and preventive measures are most likely to keep you healthy. Ask your health care provider for more information. WEIGHT AND DIET  Eat a healthy diet  Be sure to include plenty of vegetables, fruits, low-fat dairy products, and lean protein.  Do not eat a lot of foods high in solid fats, added sugars, or salt.  Get regular exercise. This is one of the most important things you can do for your health.  Most adults should exercise for at least 150 minutes each week. The exercise should increase your heart rate and make you sweat (moderate-intensity exercise).  Most adults should also do strengthening exercises at least twice a week. This is in addition to the moderate-intensity exercise.  Maintain a healthy weight  Body mass index (BMI) is a measurement that can be used to identify possible weight problems. It estimates body fat based on height and weight. Your health care provider can help determine your BMI and help you achieve or maintain a healthy weight.  For females 33 years of age and older:   A BMI below 18.5 is considered underweight.  A BMI of 18.5 to 24.9 is normal.  A BMI of 25 to 29.9 is considered overweight.  A  BMI of 30 and above is considered obese.  Watch levels of cholesterol and blood lipids  You should start having your blood tested for lipids and cholesterol at 64 years of age, then have this test every 5 years.  You may need to have your cholesterol levels checked more often if:  Your lipid or cholesterol levels are high.  You are older than 64 years of age.  You are at high risk for heart disease.  CANCER SCREENING   Lung Cancer  Lung cancer screening is recommended for adults 40-59 years old who are at high risk for lung cancer because of a history of smoking.  A yearly low-dose CT scan of the lungs is recommended for people who:  Currently smoke.  Have quit within the past 15 years.  Have at least a 30-pack-year history of smoking. A pack year is smoking an average of one pack of cigarettes a day for 1 year.  Yearly screening should continue until it has been 15 years since you quit.  Yearly screening should stop if you develop a health problem that would prevent you from having lung cancer treatment.  Breast Cancer  Practice breast self-awareness. This means understanding how your breasts normally appear and feel.  It also means doing regular breast self-exams. Let your health care provider know about any changes, no matter how small.  If you are in your 20s or 30s, you should have a clinical breast exam (CBE) by a  a health care provider every 1-3 years as part of a regular health exam.  If you are 40 or older, have a CBE every year. Also consider having a breast X-ray (mammogram) every year.  If you have a family history of breast cancer, talk to your health care provider about genetic screening.  If you are at high risk for breast cancer, talk to your health care provider about having an MRI and a mammogram every year.  Breast cancer gene (BRCA) assessment is recommended for women who have family members with BRCA-related cancers. BRCA-related cancers  include:  Breast.  Ovarian.  Tubal.  Peritoneal cancers.  Results of the assessment will determine the need for genetic counseling and BRCA1 and BRCA2 testing. Cervical Cancer Routine pelvic examinations to screen for cervical cancer are no longer recommended for nonpregnant women who are considered low risk for cancer of the pelvic organs (ovaries, uterus, and vagina) and who do not have symptoms. A pelvic examination may be necessary if you have symptoms including those associated with pelvic infections. Ask your health care provider if a screening pelvic exam is right for you.   The Pap test is the screening test for cervical cancer for women who are considered at risk.  If you had a hysterectomy for a problem that was not cancer or a condition that could lead to cancer, then you no longer need Pap tests.  If you are older than 65 years, and you have had normal Pap tests for the past 10 years, you no longer need to have Pap tests.  If you have had past treatment for cervical cancer or a condition that could lead to cancer, you need Pap tests and screening for cancer for at least 20 years after your treatment.  If you no longer get a Pap test, assess your risk factors if they change (such as having a new sexual partner). This can affect whether you should start being screened again.  Some women have medical problems that increase their chance of getting cervical cancer. If this is the case for you, your health care provider may recommend more frequent screening and Pap tests.  The human papillomavirus (HPV) test is another test that may be used for cervical cancer screening. The HPV test looks for the virus that can cause cell changes in the cervix. The cells collected during the Pap test can be tested for HPV.  The HPV test can be used to screen women 30 years of age and older. Getting tested for HPV can extend the interval between normal Pap tests from three to five years.  An HPV  test also should be used to screen women of any age who have unclear Pap test results.  After 64 years of age, women should have HPV testing as often as Pap tests.  Colorectal Cancer  This type of cancer can be detected and often prevented.  Routine colorectal cancer screening usually begins at 64 years of age and continues through 64 years of age.  Your health care provider may recommend screening at an earlier age if you have risk factors for colon cancer.  Your health care provider may also recommend using home test kits to check for hidden blood in the stool.  A small camera at the end of a tube can be used to examine your colon directly (sigmoidoscopy or colonoscopy). This is done to check for the earliest forms of colorectal cancer.  Routine screening usually begins at age 50.  Direct examination   of the colon should be repeated every 5-10 years through 64 years of age. However, you may need to be screened more often if early forms of precancerous polyps or small growths are found. Skin Cancer  Check your skin from head to toe regularly.  Tell your health care provider about any new moles or changes in moles, especially if there is a change in a mole's shape or color.  Also tell your health care provider if you have a mole that is larger than the size of a pencil eraser.  Always use sunscreen. Apply sunscreen liberally and repeatedly throughout the day.  Protect yourself by wearing long sleeves, pants, a wide-brimmed hat, and sunglasses whenever you are outside. HEART DISEASE, DIABETES, AND HIGH BLOOD PRESSURE   Have your blood pressure checked at least every 1-2 years. High blood pressure causes heart disease and increases the risk of stroke.  If you are between 55 years and 79 years old, ask your health care provider if you should take aspirin to prevent strokes.  Have regular diabetes screenings. This involves taking a blood sample to check your fasting blood sugar  level.  If you are at a normal weight and have a low risk for diabetes, have this test once every three years after 64 years of age.  If you are overweight and have a high risk for diabetes, consider being tested at a younger age or more often. PREVENTING INFECTION  Hepatitis B  If you have a higher risk for hepatitis B, you should be screened for this virus. You are considered at high risk for hepatitis B if:  You were born in a country where hepatitis B is common. Ask your health care provider which countries are considered high risk.  Your parents were born in a high-risk country, and you have not been immunized against hepatitis B (hepatitis B vaccine).  You have HIV or AIDS.  You use needles to inject street drugs.  You live with someone who has hepatitis B.  You have had sex with someone who has hepatitis B.  You get hemodialysis treatment.  You take certain medicines for conditions, including cancer, organ transplantation, and autoimmune conditions. Hepatitis C  Blood testing is recommended for:  Everyone born from 1945 through 1965.  Anyone with known risk factors for hepatitis C. Sexually transmitted infections (STIs)  You should be screened for sexually transmitted infections (STIs) including gonorrhea and chlamydia if:  You are sexually active and are younger than 64 years of age.  You are older than 64 years of age and your health care provider tells you that you are at risk for this type of infection.  Your sexual activity has changed since you were last screened and you are at an increased risk for chlamydia or gonorrhea. Ask your health care provider if you are at risk.  If you do not have HIV, but are at risk, it may be recommended that you take a prescription medicine daily to prevent HIV infection. This is called pre-exposure prophylaxis (PrEP). You are considered at risk if:  You are sexually active and do not regularly use condoms or know the HIV status  of your partner(s).  You take drugs by injection.  You are sexually active with a partner who has HIV. Talk with your health care provider about whether you are at high risk of being infected with HIV. If you choose to begin PrEP, you should first be tested for HIV. You should then be tested every   3 months for as long as you are taking PrEP.  PREGNANCY   If you are premenopausal and you may become pregnant, ask your health care provider about preconception counseling.  If you may become pregnant, take 400 to 800 micrograms (mcg) of folic acid every day.  If you want to prevent pregnancy, talk to your health care provider about birth control (contraception). OSTEOPOROSIS AND MENOPAUSE   Osteoporosis is a disease in which the bones lose minerals and strength with aging. This can result in serious bone fractures. Your risk for osteoporosis can be identified using a bone density scan.  If you are 65 years of age or older, or if you are at risk for osteoporosis and fractures, ask your health care provider if you should be screened.  Ask your health care provider whether you should take a calcium or vitamin D supplement to lower your risk for osteoporosis.  Menopause may have certain physical symptoms and risks.  Hormone replacement therapy may reduce some of these symptoms and risks. Talk to your health care provider about whether hormone replacement therapy is right for you.  HOME CARE INSTRUCTIONS   Schedule regular health, dental, and eye exams.  Stay current with your immunizations.   Do not use any tobacco products including cigarettes, chewing tobacco, or electronic cigarettes.  If you are pregnant, do not drink alcohol.  If you are breastfeeding, limit how much and how often you drink alcohol.  Limit alcohol intake to no more than 1 drink per day for nonpregnant women. One drink equals 12 ounces of beer, 5 ounces of wine, or 1 ounces of hard liquor.  Do not use street  drugs.  Do not share needles.  Ask your health care provider for help if you need support or information about quitting drugs.  Tell your health care provider if you often feel depressed.  Tell your health care provider if you have ever been abused or do not feel safe at home. Document Released: 06/06/2011 Document Revised: 04/07/2014 Document Reviewed: 10/23/2013 Exit[employer] Patient Information 2015 [employer]. This information is not intended to replace advice given to you by your health care provider. Make sure you discuss any questions you have with your health care provider.  

## 2014-10-27 NOTE — Progress Notes (Signed)
Pre visit review using our clinic review tool, if applicable. No additional management support is needed unless otherwise documented below in the visit note. 

## 2014-10-28 ENCOUNTER — Encounter: Payer: Self-pay | Admitting: Gastroenterology

## 2014-10-28 ENCOUNTER — Encounter: Payer: Self-pay | Admitting: Internal Medicine

## 2014-12-11 ENCOUNTER — Ambulatory Visit (AMBULATORY_SURGERY_CENTER): Payer: Self-pay | Admitting: *Deleted

## 2014-12-11 VITALS — Ht 65.0 in | Wt 121.5 lb

## 2014-12-11 DIAGNOSIS — Z1211 Encounter for screening for malignant neoplasm of colon: Secondary | ICD-10-CM

## 2014-12-11 MED ORDER — MOVIPREP 100 G PO SOLR: ORAL | Status: DC

## 2014-12-11 NOTE — Progress Notes (Signed)
No egg or soy allergy  No anesthesia or intubation problems per pt  No diet medications taken   

## 2014-12-15 ENCOUNTER — Other Ambulatory Visit: Payer: Self-pay | Admitting: Internal Medicine

## 2014-12-25 ENCOUNTER — Encounter: Payer: Self-pay | Admitting: Gastroenterology

## 2014-12-25 ENCOUNTER — Ambulatory Visit (AMBULATORY_SURGERY_CENTER): Payer: Commercial Managed Care - PPO | Admitting: Gastroenterology

## 2014-12-25 VITALS — BP 128/79 | HR 68 | Temp 97.9°F | Resp 19 | Ht 65.0 in | Wt 121.0 lb

## 2014-12-25 DIAGNOSIS — Z1211 Encounter for screening for malignant neoplasm of colon: Secondary | ICD-10-CM

## 2014-12-25 MED ORDER — SODIUM CHLORIDE 0.9 % IV SOLN: 500.0000 mL | INTRAVENOUS | Status: DC

## 2014-12-25 NOTE — Op Note (Signed)
[place]  [address] [place], [id number]   COLONOSCOPY PROCEDURE REPORT  PATIENT: Lindsey Rowland, Lindsey Rowland  MR#: [id number] BIRTHDATE: 1951/10/16 , 63  yrs. old GENDER: female ENDOSCOPIST: Ladene Artist, MD, [facility] REFERRED UX:[id number] Burnice Logan, M.D. PROCEDURE DATE:  12/25/2014 PROCEDURE:   Colonoscopy, screening First Screening Colonoscopy - Avg.  risk and is 50 yrs.  old or older - No.  Prior Negative Screening - Now for repeat screening. 10 or more years since last screening  History of Adenoma - Now for follow-up colonoscopy & has been > or = to 3 yrs.  N/A  Polyps Removed Today? Yes. ASA CLASS:   Class II INDICATIONS:average risk for colorectal cancer. MEDICATIONS: Monitored anesthesia care, Propofol 300 mg IV, and lidocaine 40 mg IV DESCRIPTION OF PROCEDURE:   After the risks benefits and alternatives of the procedure were thoroughly explained, informed consent was obtained.  The digital rectal exam revealed no abnormalities of the rectum.   The LB [id number]  endoscope was introduced through the anus and advanced to the cecum, which was identified by both the appendix and ileocecal valve. No adverse events experienced. with a tortuous colon.   The quality of the prep was excellent, using MoviPrep  The instrument was then slowly withdrawn as the colon was fully examined.    COLON FINDINGS: A normal appearing cecum, ileocecal valve, and appendiceal orifice were identified.  The ascending, transverse, descending, sigmoid colon, and rectum appeared unremarkable. Retroflexion was not performed due to a narrow rectal vault. The time to cecum=4 minutes 12 seconds.  Withdrawal time=9 minutes 38 seconds.  The scope was withdrawn and the procedure completed. COMPLICATIONS: There were no immediate complications.  ENDOSCOPIC IMPRESSION: Normal colonoscopy  RECOMMENDATIONS: You should continue to follow colorectal cancer screening guidelines for "routine  risk" patients with a repeat colonoscopy in 10 years. There is no need for routine, screening FOBT (stool) testing for at least 5 years.  eSigned:  Ladene Artist, MD, [facility] 12/25/2014 11:28 AM

## 2014-12-25 NOTE — Progress Notes (Signed)
Stable to RR 

## 2014-12-25 NOTE — Patient Instructions (Signed)
YOU HAD AN ENDOSCOPIC PROCEDURE TODAY AT [facility]: Refer to the procedure report that was given to you for any specific questions about what was found during the examination.  If the procedure report does not answer your questions, please call your gastroenterologist to clarify.  If you requested that your care partner not be given the details of your procedure findings, then the procedure report has been included in a sealed envelope for you to review at your convenience later.  YOU SHOULD EXPECT: Some feelings of bloating in the abdomen. Passage of more gas than usual.  Walking can help get rid of the air that was put into your GI tract during the procedure and reduce the bloating. If you had a lower endoscopy (such as a colonoscopy or flexible sigmoidoscopy) you may notice spotting of blood in your stool or on the toilet paper. If you underwent a bowel prep for your procedure, then you may not have a normal bowel movement for a few days.  DIET: Your first meal following the procedure should be a light meal and then it is ok to progress to your normal diet.  A half-sandwich or bowl of soup is an example of a good first meal.  Heavy or fried foods are harder to digest and may make you feel nauseous or bloated.  Likewise meals heavy in dairy and vegetables can cause extra gas to form and this can also increase the bloating.  Drink plenty of fluids but you should avoid alcoholic beverages for 24 hours.  ACTIVITY: Your care partner should take you home directly after the procedure.  You should plan to take it easy, moving slowly for the rest of the day.  You can resume normal activity the day after the procedure however you should NOT DRIVE or use heavy machinery for 24 hours (because of the sedation medicines used during the test).    SYMPTOMS TO REPORT IMMEDIATELY: A gastroenterologist can be reached at any hour.  During normal business hours, 8:30 AM to 5:00 PM Monday through Friday,  call [phone].  After hours and on weekends, please call the GI answering service at [phone] who will take a message and have the physician on call contact you.   Following lower endoscopy (colonoscopy or flexible sigmoidoscopy):  Excessive amounts of blood in the stool  Significant tenderness or worsening of abdominal pains  Swelling of the abdomen that is new, acute  Fever of 100F or higher  FOLLOW UP: Our staff will call the home number listed on your records the next business day following your procedure to check on you and address any questions or concerns that you may have at that time regarding the information given to you following your procedure. This is a courtesy call and so if there is no answer at the home number and we have not heard from you through the emergency physician on call, we will assume that you have returned to your regular daily activities without incident.  SIGNATURES/CONFIDENTIALITY: You and/or your care partner have signed paperwork which will be entered into your electronic medical record.  These signatures attest to the fact that that the information above on your After Visit Summary has been reviewed and is understood.  Full responsibility of the confidentiality of this discharge information lies with you and/or your care-partner.  You might note some irritation in your nose or some drainage.  This may cause feels of congestion.  This is from the oxygen, which can  be irritating.  There is no need for concern, this should clear up in a day or so  Normal colonoscopy  Next colonoscopy- 10 years  Continue your normal medications

## 2014-12-29 ENCOUNTER — Telehealth: Payer: Self-pay | Admitting: *Deleted

## 2014-12-29 NOTE — Telephone Encounter (Signed)
  Follow up Call-  Call back number 12/25/2014  Post procedure Call Back phone  # [phone]  Permission to leave phone message Yes     Patient questions:  Do you have a fever, pain , or abdominal swelling? No. Pain Score  0 *  Have you tolerated food without any problems? Yes.    Have you been able to return to your normal activities? Yes.    Do you have any questions about your discharge instructions: Diet   No. Medications  No. Follow up visit  No.  Do you have questions or concerns about your Care? No.  Actions: * If pain score is 4 or above: No action needed, pain <4.

## 2015-01-06 ENCOUNTER — Other Ambulatory Visit: Payer: Self-pay | Admitting: Internal Medicine

## 2015-04-17 ENCOUNTER — Telehealth: Payer: Self-pay

## 2015-04-17 NOTE — Telephone Encounter (Signed)
Left a message for pt to return call to update mammogram in [facility].

## 2015-05-14 ENCOUNTER — Other Ambulatory Visit: Payer: Self-pay | Admitting: Internal Medicine

## 2015-09-14 ENCOUNTER — Telehealth: Payer: Self-pay | Admitting: Internal Medicine

## 2015-09-14 NOTE — Telephone Encounter (Signed)
Pt can not have x-ray done without being seen first. When she comes in for physical he will evaluate whether one needs to be done or not and then order.

## 2015-09-14 NOTE — Telephone Encounter (Signed)
Would like to have a chest xray prior to her cpe on 11/29. Pt states it has been a while since she had one and needs one.

## 2015-10-12 NOTE — Telephone Encounter (Signed)
Pt aware.

## 2015-10-26 ENCOUNTER — Other Ambulatory Visit (INDEPENDENT_AMBULATORY_CARE_PROVIDER_SITE_OTHER): Payer: Commercial Managed Care - PPO

## 2015-10-26 DIAGNOSIS — Z0000 Encounter for general adult medical examination without abnormal findings: Secondary | ICD-10-CM

## 2015-10-26 LAB — LIPID PANEL
CHOL/HDL RATIO: 4
Cholesterol: 200 mg/dL (ref 0–200)
HDL: 56.8 mg/dL (ref >39.00)
LDL Cholesterol: 129 mg/dL — ABNORMAL HIGH (ref 0–99)
NONHDL: 142.94
TRIGLYCERIDES: 69 mg/dL (ref 0.0–149.0)
VLDL: 13.8 mg/dL (ref 0.0–40.0)

## 2015-10-26 LAB — POCT URINALYSIS DIPSTICK
Glucose, UA: NEGATIVE
Ketones, UA: NEGATIVE
Leukocytes, UA: NEGATIVE
Nitrite, UA: NEGATIVE
PH UA: 5.5
PROTEIN UA: NEGATIVE
RBC UA: NEGATIVE
SPEC GRAV UA: 1.025
UROBILINOGEN UA: 0.2

## 2015-10-26 LAB — HEPATIC FUNCTION PANEL
ALBUMIN: 4.3 g/dL (ref 3.5–5.2)
ALK PHOS: 80 U/L (ref 39–117)
ALT: 14 U/L (ref 0–35)
AST: 20 U/L (ref 0–37)
BILIRUBIN DIRECT: 0.1 mg/dL (ref 0.0–0.3)
TOTAL PROTEIN: 6.6 g/dL (ref 6.0–8.3)
Total Bilirubin: 0.7 mg/dL (ref 0.2–1.2)

## 2015-10-26 LAB — BASIC METABOLIC PANEL
BUN: 16 mg/dL (ref 6–23)
CHLORIDE: 106 meq/L (ref 96–112)
CO2: 24 meq/L (ref 19–32)
Calcium: 9.7 mg/dL (ref 8.4–10.5)
Creatinine, Ser: 0.67 mg/dL (ref 0.40–1.20)
GFR: 93.81 mL/min (ref >60.00)
Glucose, Bld: 88 mg/dL (ref 70–99)
POTASSIUM: 3.9 meq/L (ref 3.5–5.1)
SODIUM: 142 meq/L (ref 135–145)

## 2015-10-26 LAB — TSH: TSH: 0.53 u[IU]/mL (ref 0.35–4.50)

## 2015-10-27 LAB — CBC WITH DIFFERENTIAL/PLATELET
BASOS PCT: 0.4 % (ref 0.0–3.0)
Basophils Absolute: 0 10*3/uL (ref 0.0–0.1)
EOS PCT: 1.1 % (ref 0.0–5.0)
Eosinophils Absolute: 0.1 10*3/uL (ref 0.0–0.7)
HEMATOCRIT: 44.1 % (ref 36.0–46.0)
HEMOGLOBIN: 14.9 g/dL (ref 12.0–15.0)
LYMPHS PCT: 33.8 % (ref 12.0–46.0)
Lymphs Abs: 1.7 10*3/uL (ref 0.7–4.0)
MCHC: 33.7 g/dL (ref 30.0–36.0)
MCV: 100.3 fl — AB (ref 78.0–100.0)
MONOS PCT: 5.9 % (ref 3.0–12.0)
Monocytes Absolute: 0.3 10*3/uL (ref 0.1–1.0)
NEUTROS ABS: 2.9 10*3/uL (ref 1.4–7.7)
Neutrophils Relative %: 58.8 % (ref 43.0–77.0)
PLATELETS: 284 10*3/uL (ref 150.0–400.0)
RBC: 4.4 Mil/uL (ref 3.87–5.11)
RDW: 13.9 % (ref 11.5–15.5)
WBC: 4.9 10*3/uL (ref 4.0–10.5)

## 2015-11-02 ENCOUNTER — Ambulatory Visit (INDEPENDENT_AMBULATORY_CARE_PROVIDER_SITE_OTHER): Payer: Commercial Managed Care - PPO | Admitting: Internal Medicine

## 2015-11-02 ENCOUNTER — Encounter: Payer: Self-pay | Admitting: Internal Medicine

## 2015-11-02 VITALS — BP 120/80 | HR 88 | Temp 98.5°F | Resp 18 | Ht 65.0 in | Wt 123.0 lb

## 2015-11-02 DIAGNOSIS — Z0000 Encounter for general adult medical examination without abnormal findings: Secondary | ICD-10-CM

## 2015-11-02 DIAGNOSIS — I10 Essential (primary) hypertension: Secondary | ICD-10-CM | POA: Diagnosis not present

## 2015-11-02 DIAGNOSIS — Z23 Encounter for immunization: Secondary | ICD-10-CM | POA: Diagnosis not present

## 2015-11-02 DIAGNOSIS — J449 Chronic obstructive pulmonary disease, unspecified: Secondary | ICD-10-CM

## 2015-11-02 MED ORDER — PANTOPRAZOLE SODIUM 40 MG PO TBEC: DELAYED_RELEASE_TABLET | ORAL | Status: DC

## 2015-11-02 MED ORDER — HYOSCYAMINE SULFATE ER 0.375 MG PO TB12: 0.3750 mg | ORAL_TABLET | Freq: Two times a day (BID) | ORAL | Status: DC | PRN

## 2015-11-02 MED ORDER — ALBUTEROL SULFATE HFA 108 (90 BASE) MCG/ACT IN AERS: 2.0000 | INHALATION_SPRAY | Freq: Four times a day (QID) | RESPIRATORY_TRACT | Status: DC | PRN

## 2015-11-02 MED ORDER — CYCLOBENZAPRINE HCL 10 MG PO TABS: 10.0000 mg | ORAL_TABLET | Freq: Three times a day (TID) | ORAL | Status: DC | PRN

## 2015-11-02 NOTE — Patient Instructions (Signed)
Smoking tobacco is very bad for your health. You should stop smoking immediately.    It is important that you exercise regularly, at least 20 minutes 3 to 4 times per week.  If you develop chest pain or shortness of breath seek  medical attention.  Menopause is a normal process in which your reproductive ability comes to an end. This process happens gradually over a span of months to years, usually between the ages of 52 and 27. Menopause is complete when you have missed 12 consecutive menstrual periods. It is important to talk with your health care provider about some of the most common conditions that affect postmenopausal women, such as heart disease, cancer, and bone loss (osteoporosis). Adopting a healthy lifestyle and getting preventive care can help to promote your health and wellness. Those actions can also lower your chances of developing some of these common conditions. WHAT SHOULD I KNOW ABOUT MENOPAUSE? During menopause, you may experience a number of symptoms, such as:  Moderate-to-severe hot flashes.  Night sweats.  Decrease in sex drive.  Mood swings.  Headaches.  Tiredness.  Irritability.  Memory problems.  Insomnia. Choosing to treat or not to treat menopausal changes is an individual decision that you make with your health care provider. WHAT SHOULD I KNOW ABOUT HORMONE REPLACEMENT THERAPY AND SUPPLEMENTS? Hormone therapy products are effective for treating symptoms that are associated with menopause, such as hot flashes and night sweats. Hormone replacement carries certain risks, especially as you become older. If you are thinking about using estrogen or estrogen with progestin treatments, discuss the benefits and risks with your health care provider. WHAT SHOULD I KNOW ABOUT HEART DISEASE AND STROKE? Heart disease, heart attack, and stroke become more likely as you age. This may be due, in part, to the hormonal changes that your body experiences during menopause.  These can affect how your body processes dietary fats, triglycerides, and cholesterol. Heart attack and stroke are both medical emergencies. There are many things that you can do to help prevent heart disease and stroke:  Have your blood pressure checked at least every 1-2 years. High blood pressure causes heart disease and increases the risk of stroke.  If you are 41-11 years old, ask your health care provider if you should take aspirin to prevent a heart attack or a stroke.  Do not use any tobacco products, including cigarettes, chewing tobacco, or electronic cigarettes. If you need help quitting, ask your health care provider.  It is important to eat a healthy diet and maintain a healthy weight.  Be sure to include plenty of vegetables, fruits, low-fat dairy products, and lean protein.  Avoid eating foods that are high in solid fats, added sugars, or salt (sodium).  Get regular exercise. This is one of the most important things that you can do for your health.  Try to exercise for at least 150 minutes each week. The type of exercise that you do should increase your heart rate and make you sweat. This is known as moderate-intensity exercise.  Try to do strengthening exercises at least twice each week. Do these in addition to the moderate-intensity exercise.  Know your numbers.Ask your health care provider to check your cholesterol and your blood glucose. Continue to have your blood tested as directed by your health care provider. WHAT SHOULD I KNOW ABOUT CANCER SCREENING? There are several types of cancer. Take the following steps to reduce your risk and to catch any cancer development as early as possible. Breast Cancer  Practice breast self-awareness.  This means understanding how your breasts normally appear and feel.  It also means doing regular breast self-exams. Let your health care provider know about any changes, no matter how small.  If you are 36 or older, have a  clinician do a breast exam (clinical breast exam or CBE) every year. Depending on your age, family history, and medical history, it may be recommended that you also have a yearly breast X-ray (mammogram).  If you have a family history of breast cancer, talk with your health care provider about genetic screening.  If you are at high risk for breast cancer, talk with your health care provider about having an MRI and a mammogram every year.  Breast cancer (BRCA) gene test is recommended for women who have family members with BRCA-related cancers. Results of the assessment will determine the need for genetic counseling and BRCA1 and for BRCA2 testing. BRCA-related cancers include these types:  Breast. This occurs in males or females.  Ovarian.  Tubal. This may also be called fallopian tube cancer.  Cancer of the abdominal or pelvic lining (peritoneal cancer).  Prostate.  Pancreatic. Cervical, Uterine, and Ovarian Cancer Your health care provider may recommend that you be screened regularly for cancer of the pelvic organs. These include your ovaries, uterus, and vagina. This screening involves a pelvic exam, which includes checking for microscopic changes to the surface of your cervix (Pap test).  For women ages 21-65, health care providers may recommend a pelvic exam and a Pap test every three years. For women ages 11-65, they may recommend the Pap test and pelvic exam, combined with testing for human papilloma virus (HPV), every five years. Some types of HPV increase your risk of cervical cancer. Testing for HPV may also be done on women of any age who have unclear Pap test results.  Other health care providers may not recommend any screening for nonpregnant women who are considered low risk for pelvic cancer and have no symptoms. Ask your health care provider if a screening pelvic exam is right for you.  If you have had past treatment for cervical cancer or a condition that could lead to  cancer, you need Pap tests and screening for cancer for at least 20 years after your treatment. If Pap tests have been discontinued for you, your risk factors (such as having a new sexual partner) need to be reassessed to determine if you should start having screenings again. Some women have medical problems that increase the chance of getting cervical cancer. In these cases, your health care provider may recommend that you have screening and Pap tests more often.  If you have a family history of uterine cancer or ovarian cancer, talk with your health care provider about genetic screening.  If you have vaginal bleeding after reaching menopause, tell your health care provider.  There are currently no reliable tests available to screen for ovarian cancer. Lung Cancer Lung cancer screening is recommended for adults 41-45 years old who are at high risk for lung cancer because of a history of smoking. A yearly low-dose CT scan of the lungs is recommended if you:  Currently smoke.  Have a history of at least 30 pack-years of smoking and you currently smoke or have quit within the past 15 years. A pack-year is smoking an average of one pack of cigarettes per day for one year. Yearly screening should:  Continue until it has been 15 years since you quit.  Stop if you  develop a health problem that would prevent you from having lung cancer treatment. Colorectal Cancer  This type of cancer can be detected and can often be prevented.  Routine colorectal cancer screening usually begins at age 27 and continues through age 64.  If you have risk factors for colon cancer, your health care provider may recommend that you be screened at an earlier age.  If you have a family history of colorectal cancer, talk with your health care provider about genetic screening.  Your health care provider may also recommend using home test kits to check for hidden blood in your stool.  A small camera at the end of a tube  can be used to examine your colon directly (sigmoidoscopy or colonoscopy). This is done to check for the earliest forms of colorectal cancer.  Direct examination of the colon should be repeated every 5-10 years until age 70. However, if early forms of precancerous polyps or small growths are found or if you have a family history or genetic risk for colorectal cancer, you may need to be screened more often. Skin Cancer  Check your skin from head to toe regularly.  Monitor any moles. Be sure to tell your health care provider:  About any new moles or changes in moles, especially if there is a change in a mole's shape or color.  If you have a mole that is larger than the size of a pencil eraser.  If any of your family members has a history of skin cancer, especially at a young age, talk with your health care provider about genetic screening.  Always use sunscreen. Apply sunscreen liberally and repeatedly throughout the day.  Whenever you are outside, protect yourself by wearing long sleeves, pants, a wide-brimmed hat, and sunglasses. WHAT SHOULD I KNOW ABOUT OSTEOPOROSIS? Osteoporosis is a condition in which bone destruction happens more quickly than new bone creation. After menopause, you may be at an increased risk for osteoporosis. To help prevent osteoporosis or the bone fractures that can happen because of osteoporosis, the following is recommended:  If you are 41-53 years old, get at least 1,000 mg of calcium and at least 600 mg of vitamin D per day.  If you are older than age 56 but younger than age 20, get at least 1,200 mg of calcium and at least 600 mg of vitamin D per day.  If you are older than age 101, get at least 1,200 mg of calcium and at least 800 mg of vitamin D per day. Smoking and excessive alcohol intake increase the risk of osteoporosis. Eat foods that are rich in calcium and vitamin D, and do weight-bearing exercises several times each week as directed by your health care  provider. WHAT SHOULD I KNOW ABOUT HOW MENOPAUSE AFFECTS [place]? Depression may occur at any age, but it is more common as you become older. Common symptoms of depression include:  Low or sad mood.  Changes in sleep patterns.  Changes in appetite or eating patterns.  Feeling an overall lack of motivation or enjoyment of activities that you previously enjoyed.  Frequent crying spells. Talk with your health care provider if you think that you are experiencing depression. WHAT SHOULD I KNOW ABOUT IMMUNIZATIONS? It is important that you get and maintain your immunizations. These include:  Tetanus, diphtheria, and pertussis (Tdap) booster vaccine.  Influenza every year before the flu season begins.  Pneumonia vaccine.  Shingles vaccine. Your health care provider may also recommend other immunizations.  This information is not intended to replace advice given to you by your health care provider. Make sure you discuss any questions you have with your health care provider.   Document Released: 01/13/2006 Document Revised: 12/12/2014 Document Reviewed: 07/24/2014 Elsevier Interactive Patient Education [employer].

## 2015-11-02 NOTE — Progress Notes (Signed)
Pre visit review using our clinic review tool, if applicable. No additional management support is needed unless otherwise documented below in the visit note. 

## 2015-11-02 NOTE — Progress Notes (Signed)
Subjective:    Patient ID: Lindsey Rowland, female    DOB: 09-26-1951, 64 y.o.   MRN: [id number]  HPI Pre-visit discussion using our clinic review tool. No additional management support is needed unless otherwise documented below in the visit note.   64   year-old patient who is a former patient of Dr. Jerene Canny. She presents today for an annual health examinationnt.  She does have a history of COPD and on going low-grade tobacco use. She smokes 5-6 cigarettes per day. She does have albuterol rescue medication that has not been required. Medical records reviewed. She does have a history of osteoarthritis and  has been  on Boniva for osteoporosis. She has remote history of ulcer disease and uses Protonix. She has hypertension controlled with Zestril.  She receives annual gynecologic care in [facility] Her initial colonoscopy was in 2003 by Dr. Lajoyce Corners in follow-up in January 2016  She has been on hydrocodone and Valium and has received regular refills.  Her past. Two Urine drug screen have been negative for metabolites     family history- both parents died in their late 12s father had atrial fibrillation and dementia. Mother had COPD and died of complications from a fractured hip. One brother is well  Past Medical History  Diagnosis Date  . Arthritis   . COPD (chronic obstructive pulmonary disease) ([place])   . Ulcer   . Headache(784.0)   . Polio   . Osteopenia     Social History   Social History  . Marital Status: Married    Spouse Name: N/A  . Number of Children: N/A  . Years of Education: N/A   Occupational History  . Not on file.   Social History Main Topics  . Smoking status: Current Every Day Smoker -- 0.50 packs/day  . Smokeless tobacco: Never Used     Comment: pt uses electronic cigarettes   . Alcohol Use: Yes     Comment: 2 beers per week  . Drug Use: No  . Sexual Activity: No   Other Topics Concern  . Not on file   Social History Narrative    Past Surgical  History  Procedure Laterality Date  . Appendectomy    . Tonsillectomy    . Retinal detachment surgery  12/2010    right eye  . Cataract extraction      right  . Dilation and curettage of uterus      Family History  Problem Relation Age of Onset  . Colon cancer Neg Hx   . Esophageal cancer Neg Hx   . Stomach cancer Neg Hx   . Rectal cancer Neg Hx   . Hiatal hernia Father     No Known Allergies  Current Outpatient Prescriptions on File Prior to Visit  Medication Sig Dispense Refill  . Aspirin-Salicylamide-Caffeine (BC HEADACHE POWDER PO) Take 1 packet by mouth daily as needed. For pain.    . celecoxib (CELEBREX) 200 MG capsule TAKE ONE CAPSULE TWICE A DAY 60 capsule 5  . diazepam (VALIUM) 10 MG tablet Take 10 mg by mouth at bedtime. PRN  2  . meclizine (ANTIVERT) 25 MG tablet Take 25 mg by mouth 3 (three) times daily as needed.      . Multiple Vitamin (MULITIVITAMIN WITH MINERALS) TABS Take 1 tablet by mouth daily.     No current facility-administered medications on file prior to visit.    BP 120/80 mmHg  Pulse 88  Temp(Src) 98.5 F (36.9 C) (Oral)  Resp  18  Ht '5\' 5"'$  (1.651 m)  Wt 123 lb (55.792 kg)  BMI 20.47 kg/m2  SpO2 96%    Review of Systems  Constitutional: Positive for fatigue.  HENT: Positive for congestion and sinus pressure. Negative for dental problem, hearing loss, rhinorrhea, sore throat and tinnitus.   Eyes: Negative for pain, discharge and visual disturbance.  Respiratory: Positive for cough. Negative for shortness of breath.   Cardiovascular: Negative for chest pain, palpitations and leg swelling.  Gastrointestinal: Negative for nausea, vomiting, abdominal pain, diarrhea, constipation, blood in stool and abdominal distention.  Genitourinary: Negative for dysuria, urgency, frequency, hematuria, flank pain, vaginal bleeding, vaginal discharge, difficulty urinating, vaginal pain and pelvic pain.  Musculoskeletal: Positive for arthralgias. Negative for  joint swelling and gait problem.  Skin: Negative for rash.  Neurological: Negative for dizziness, syncope, speech difficulty, weakness, numbness and headaches.  Hematological: Negative for adenopathy.  Psychiatric/Behavioral: Negative for behavioral problems, dysphoric mood and agitation. The patient is not nervous/anxious.        Objective:   Physical Exam  Constitutional: She is oriented to person, place, and time. She appears well-developed and well-nourished.  HENT:  Head: Normocephalic and atraumatic.  Right Ear: External ear normal.  Left Ear: External ear normal.  Mouth/Throat: Oropharynx is clear and moist.  Eyes: Conjunctivae and EOM are normal.  Neck: Normal range of motion. Neck supple. No JVD present. No thyromegaly present.  Cardiovascular: Normal rate, regular rhythm, normal heart sounds and intact distal pulses.   No murmur heard. Decreased right dorsalis pedis pulse  Pulmonary/Chest: Effort normal and breath sounds normal. She has no wheezes. She has no rales.  Abdominal: Soft. Bowel sounds are normal. She exhibits no distension and no mass. There is no tenderness. There is no rebound and no guarding.  Genitourinary: Vagina normal.  Musculoskeletal: Normal range of motion. She exhibits no edema or tenderness.  Neurological: She is alert and oriented to person, place, and time. She has normal reflexes. No cranial nerve deficit. She exhibits normal muscle tone. Coordination normal.  Skin: Skin is warm and dry. No rash noted.  Psychiatric: She has a normal mood and affect. Her behavior is normal.          Assessment & Plan:  Preventive health exam COPD . Total tobacco cessation encouraged.  Will schedule low-dose chest CT for screening Hypertension well controlled History of allergic rhinitis History of osteoarthritis IBS, stable.  Follow colonoscopy performed January 2016.  Medications updated.  This office.  We will not prescribe any further control drugs

## 2015-11-20 ENCOUNTER — Encounter: Payer: Self-pay | Admitting: Acute Care

## 2015-11-23 ENCOUNTER — Other Ambulatory Visit: Payer: Self-pay | Admitting: Acute Care

## 2015-11-23 DIAGNOSIS — F17210 Nicotine dependence, cigarettes, uncomplicated: Principal | ICD-10-CM

## 2015-11-26 ENCOUNTER — Encounter: Payer: Commercial Managed Care - PPO | Admitting: Acute Care

## 2015-11-26 ENCOUNTER — Inpatient Hospital Stay: Admission: RE | Admit: 2015-11-26 | Payer: Commercial Managed Care - PPO | Source: Ambulatory Visit

## 2015-12-04 ENCOUNTER — Ambulatory Visit: Payer: Commercial Managed Care - PPO | Admitting: Acute Care

## 2015-12-04 ENCOUNTER — Encounter: Payer: Self-pay | Admitting: Acute Care

## 2015-12-04 ENCOUNTER — Ambulatory Visit (INDEPENDENT_AMBULATORY_CARE_PROVIDER_SITE_OTHER)
Admission: RE | Admit: 2015-12-04 | Discharge: 2015-12-04 | Disposition: A | Discharge status: Home / Self Care | Payer: Commercial Managed Care - PPO | Source: Ambulatory Visit | Attending: Acute Care | Admitting: Acute Care

## 2015-12-04 DIAGNOSIS — F17210 Nicotine dependence, cigarettes, uncomplicated: Secondary | ICD-10-CM

## 2015-12-04 NOTE — Progress Notes (Signed)
Shared Decision Making Visit Lung Cancer Screening Program [phone])   Eligibility:  Age 64 y.o.  Pack Years Smoking History Calculation 30 pack years (# packs/per year x # years smoked)  Recent History of coughing up blood  no  Unexplained weight loss? no ( >Than 15 pounds within the last 6 months )  Prior History Lung / other cancer no (Diagnosis within the last 5 years already requiring surveillance chest CT Scans).  Smoking Status Current Smoker  Former Smokers: Years since quit: NA  Quit Date: NA  Visit Components:  Discussion included one or more decision making aids. yes  Discussion included risk/benefits of screening. yes  Discussion included potential follow up diagnostic testing for abnormal scans. yes  Discussion included meaning and risk of over diagnosis. yes  Discussion included meaning and risk of False Positives. yes  Discussion included meaning of total radiation exposure. yes  Counseling Included:  Importance of adherence to annual lung cancer LDCT screening. yes  Impact of comorbidities on ability to participate in the program. yes  Ability and willingness to under diagnostic treatment. yes  Smoking Cessation Counseling:  Current Smokers:   Discussed importance of smoking cessation. yes  Information about tobacco cessation classes and interventions provided to patient. yes  Patient provided with "ticket" for LDCT Scan. yes  Symptomatic Patient. no  Counseling: NA  Diagnosis Code: Tobacco Use Z72.0  Asymptomatic Patient yes  Counseling (Intermediate counseling: > three minutes counseling) D9833  Former Smokers:   Discussed the importance of maintaining cigarette abstinence. NA; current smoker  Diagnosis Code: Personal History of Nicotine Dependence. A25.053  Information about tobacco cessation classes and interventions provided to patient. Yes  Patient provided with "ticket" for LDCT Scan. yes  Written Order for Lung Cancer  Screening with LDCT placed in Epic. Yes (CT Chest Lung Cancer Screening Low Dose W/O CM) [id number] Z12.2-Screening of respiratory organs Z87.891-Personal history of nicotine dependence   I have spent 15 minutes of face to face time with Lindsey Rowland discussing the risks and benefits of lung cancer screening. We viewed a power point together that explained in detail the above noted topics. We paused at intervals to allow for questions to be asked and answered to ensure understanding.We discussed that the single most powerful action that she can take to decrease her risk of developing lung cancer is to quit smoking. We discussed whether or not she is ready to commit to setting a quit date.She is not ready to set a quit date at this time.We discussed options for tools to aid in quitting smoking including nicotine replacement therapy, non-nicotine medications, support groups, Quit Smart classes, and behavior modification. We discussed that often times setting smaller, more achievable goals, such as eliminating 1 cigarette a day for a week and then 2 cigarettes a day for a week can be helpful in slowly decreasing the number of cigarettes smoked. This allows for a sense of accomplishment as well as providing a clinical benefit. I gave her the " Be Stronger Than Your Excuses" card with contact information for community resources, classes, free nicotine replacement therapy, and access to mobile apps, text messaging, and on-line smoking cessation help. I have also given her my card and contact information in the event she needs to contact me. We discussed the time and location of the scan, and that either June Leap, CMA, or I will call with the results within 24-48 hours of receiving them. I have provided Lindsey Rowland with a copy of the power point  we viewed  as a resource in the event they need reinforcement of the concepts we discussed today in the office. The patient verbalized understanding of all of  the above and  had no further questions upon leaving the office. They have my contact information in the event they have any further questions.  Magdalen Spatz, NP

## 2015-12-15 ENCOUNTER — Telehealth: Payer: Self-pay | Admitting: Acute Care

## 2015-12-15 NOTE — Telephone Encounter (Signed)
  Jan. 10, 2017                                                                            Lindsey Rowland. Lindsey Rowland: DOB: 11-Aug-1951  Dr. Raliegh Ip, Mrs. [facility] Low Dose Screening CT was read as a Lung RADS 2, indicating nodules with a very low likelihood of becoming a clinically active cancer due to size or lack of growth, benign in appearance and behavior. The recommendation is for a repeat scan in 12 months, which we will schedule and order in Dec. 2017. We have called these results to Mrs. Lindsey Rowland, who verbalized understanding of them. Please see the forwarded scan report in Epic. Please note the incidental findings of multi-vessel coronary artery calcification and aortic atherosclerosis and follow up as you feel is clinically indicated as you know this patient's history. Thank you so much, we appreciate the referral and enjoyed working with Mrs. Lindsey Rowland. Please let me know if you have any questions or concerns regarding the LDCT results.  Eric Form, NP [facility] [phone]

## 2015-12-26 ENCOUNTER — Other Ambulatory Visit: Payer: Self-pay | Admitting: Internal Medicine

## 2016-02-24 ENCOUNTER — Other Ambulatory Visit: Payer: Self-pay | Admitting: Acute Care

## 2016-02-24 DIAGNOSIS — F17210 Nicotine dependence, cigarettes, uncomplicated: Principal | ICD-10-CM

## 2016-06-17 ENCOUNTER — Emergency Department (HOSPITAL_BASED_OUTPATIENT_CLINIC_OR_DEPARTMENT_OTHER): Payer: Commercial Managed Care - PPO

## 2016-06-17 ENCOUNTER — Emergency Department (HOSPITAL_BASED_OUTPATIENT_CLINIC_OR_DEPARTMENT_OTHER)
Admission: EM | Admit: 2016-06-17 | Discharge: 2016-06-17 | Disposition: A | Discharge status: Home / Self Care | Payer: Commercial Managed Care - PPO | Attending: Emergency Medicine | Admitting: Emergency Medicine

## 2016-06-17 ENCOUNTER — Encounter (HOSPITAL_BASED_OUTPATIENT_CLINIC_OR_DEPARTMENT_OTHER): Payer: Self-pay | Admitting: *Deleted

## 2016-06-17 DIAGNOSIS — M542 Cervicalgia: Secondary | ICD-10-CM | POA: Diagnosis not present

## 2016-06-17 DIAGNOSIS — Z7982 Long term (current) use of aspirin: Secondary | ICD-10-CM | POA: Insufficient documentation

## 2016-06-17 DIAGNOSIS — F17210 Nicotine dependence, cigarettes, uncomplicated: Secondary | ICD-10-CM | POA: Insufficient documentation

## 2016-06-17 DIAGNOSIS — Z79899 Other long term (current) drug therapy: Secondary | ICD-10-CM | POA: Insufficient documentation

## 2016-06-17 DIAGNOSIS — Y92410 Unspecified street and highway as the place of occurrence of the external cause: Secondary | ICD-10-CM | POA: Diagnosis not present

## 2016-06-17 DIAGNOSIS — M79672 Pain in left foot: Secondary | ICD-10-CM | POA: Insufficient documentation

## 2016-06-17 DIAGNOSIS — R110 Nausea: Secondary | ICD-10-CM | POA: Insufficient documentation

## 2016-06-17 DIAGNOSIS — Y999 Unspecified external cause status: Secondary | ICD-10-CM | POA: Insufficient documentation

## 2016-06-17 DIAGNOSIS — J449 Chronic obstructive pulmonary disease, unspecified: Secondary | ICD-10-CM | POA: Diagnosis not present

## 2016-06-17 DIAGNOSIS — R51 Headache: Secondary | ICD-10-CM | POA: Insufficient documentation

## 2016-06-17 DIAGNOSIS — Y9389 Activity, other specified: Secondary | ICD-10-CM | POA: Diagnosis not present

## 2016-06-17 DIAGNOSIS — M25552 Pain in left hip: Secondary | ICD-10-CM | POA: Insufficient documentation

## 2016-06-17 DIAGNOSIS — M1990 Unspecified osteoarthritis, unspecified site: Secondary | ICD-10-CM | POA: Insufficient documentation

## 2016-06-17 MED ORDER — ACETAMINOPHEN 325 MG PO TABS
650.0000 mg | ORAL_TABLET | Freq: Once | ORAL | Status: AC
  Administered 2016-06-17: 650 mg via ORAL
  Filled 2016-06-17: qty 2
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
## 2016-06-17 NOTE — Discharge Instructions (Signed)
I recommend alternating between Tylenol and ibuprofen every 4 hours as needed for pain relief. You may also take your home prescription of muscle relaxants as prescribed. I recommended resting and applying ice to affected areas for 15-20 minutes 3-4 times daily to help with pain. Follow up with your family doctor within the next week if your symptoms have not improved. Please return to the Emergency Department if symptoms worsen or new onset of fever, worsening/severe headache, dizziness, visual changes, neck stiffness, difficulty breathing, chest pain, abdominal pain, vomiting, numbness, tingling, weakness, confusion, agitation, change in mental status.

## 2016-07-01 ENCOUNTER — Ambulatory Visit (INDEPENDENT_AMBULATORY_CARE_PROVIDER_SITE_OTHER): Payer: Commercial Managed Care - PPO | Admitting: Internal Medicine

## 2016-07-01 ENCOUNTER — Encounter: Payer: Self-pay | Admitting: Internal Medicine

## 2016-07-01 VITALS — BP 120/82 | HR 80 | Ht 66.0 in | Wt 124.0 lb

## 2016-07-01 DIAGNOSIS — M159 Polyosteoarthritis, unspecified: Secondary | ICD-10-CM | POA: Diagnosis not present

## 2016-07-01 DIAGNOSIS — I10 Essential (primary) hypertension: Secondary | ICD-10-CM | POA: Diagnosis not present

## 2016-07-01 DIAGNOSIS — J449 Chronic obstructive pulmonary disease, unspecified: Secondary | ICD-10-CM | POA: Diagnosis not present

## 2016-07-01 MED ORDER — ALBUTEROL SULFATE HFA 108 (90 BASE) MCG/ACT IN AERS: 2.0000 | INHALATION_SPRAY | Freq: Four times a day (QID) | RESPIRATORY_TRACT | 6 refills | Status: DC | PRN
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
## 2016-07-01 NOTE — Progress Notes (Signed)
Pre visit review using our clinic review tool, if applicable. No additional management support is needed unless otherwise documented below in the visit note. 

## 2016-07-01 NOTE — Patient Instructions (Signed)
Limit your sodium (Salt) intake  Return for your annual exam as scheduled  Call or return to clinic prn if these symptoms worsen or fail to improve as anticipated.

## 2016-11-23 DIAGNOSIS — H10413 Chronic giant papillary conjunctivitis, bilateral: Secondary | ICD-10-CM | POA: Diagnosis not present

## 2016-11-23 DIAGNOSIS — Z961 Presence of intraocular lens: Secondary | ICD-10-CM | POA: Diagnosis not present

## 2016-11-23 DIAGNOSIS — H2512 Age-related nuclear cataract, left eye: Secondary | ICD-10-CM | POA: Diagnosis not present

## 2016-11-23 DIAGNOSIS — H04123 Dry eye syndrome of bilateral lacrimal glands: Secondary | ICD-10-CM | POA: Diagnosis not present

## 2016-11-30 ENCOUNTER — Telehealth: Payer: Self-pay | Admitting: Internal Medicine

## 2016-11-30 NOTE — Telephone Encounter (Signed)
Pt scheduled  

## 2016-11-30 NOTE — Telephone Encounter (Signed)
Please call pt and schedule her an appt with another provider tomorrow or Friday due to Dr.K out of the office and can not call anything in for pt.

## 2016-11-30 NOTE — Telephone Encounter (Signed)
Pt would like to see if something can be called in for the sinus pressure and nasal drainage she is having.  [facility]  Pt is aware that she may need to come to be dx for her symptoms in order to get Rx.

## 2016-12-01 ENCOUNTER — Ambulatory Visit (INDEPENDENT_AMBULATORY_CARE_PROVIDER_SITE_OTHER): Payer: Medicare (Managed Care) | Admitting: Family Medicine

## 2016-12-01 ENCOUNTER — Encounter: Payer: Self-pay | Admitting: Family Medicine

## 2016-12-01 VITALS — BP 118/84 | HR 87 | Temp 97.4°F | Ht 66.0 in | Wt 121.0 lb

## 2016-12-01 DIAGNOSIS — J0100 Acute maxillary sinusitis, unspecified: Secondary | ICD-10-CM | POA: Diagnosis not present

## 2016-12-01 MED ORDER — AMOXICILLIN-POT CLAVULANATE 875-125 MG PO TABS: 1.0000 | ORAL_TABLET | Freq: Two times a day (BID) | ORAL | 0 refills | Status: DC

## 2016-12-01 NOTE — Progress Notes (Signed)
HPI:  Sinusitis: -started: 2-3 weeks ago and worsening -symptoms:nasal congestion, sore throat, cough, now with green nasal congestion and sinus pain around the nose -denies:fever, SOB, NVD, wheezing, tooth pain -has tried: nothing -sick contacts/travel/risks: no reported flu, strep or tick exposure -Hx of: allergies, smoking, ? Mild COPD - she denies  ROS: See pertinent positives and negatives per HPI.  Past Medical History:  Diagnosis Date  . Arthritis   . COPD (chronic obstructive pulmonary disease) ([place])   . Headache(784.0)   . Osteopenia   . Polio   . Ulcer [facility])     Past Surgical History:  Procedure Laterality Date  . APPENDECTOMY    . CATARACT EXTRACTION     right  . DILATION AND CURETTAGE OF UTERUS    . RETINAL DETACHMENT SURGERY  12/2010   right eye  . TONSILLECTOMY      Family History  Problem Relation Age of Onset  . Colon cancer Neg Hx   . Esophageal cancer Neg Hx   . Stomach cancer Neg Hx   . Rectal cancer Neg Hx   . Hiatal hernia Father     Social History   Social History  . Marital status: Married    Spouse name: N/A  . Number of children: N/A  . Years of education: N/A   Social History Main Topics  . Smoking status: Current Every Day Smoker    Packs/day: 0.75    Years: 40.00    Types: Cigarettes  . Smokeless tobacco: Never Used     Comment: pt uses electronic cigarettes   . Alcohol use 0.0 oz/week     Comment: 2 beers per week  . Drug use: No  . Sexual activity: No   Other Topics Concern  . None   Social History Narrative  . None     Current Outpatient Prescriptions:  .  albuterol (PROAIR HFA) 108 (90 Base) MCG/ACT inhaler, Inhale 2 puffs into the lungs every 6 (six) hours as needed. For shortness of breath., Disp: 1 Inhaler, Rfl: 6 .  Aspirin-Salicylamide-Caffeine (BC HEADACHE POWDER PO), Take 1 packet by mouth daily as needed. For pain., Disp: , Rfl:  .  cyclobenzaprine (FLEXERIL) 10 MG tablet, Take 1 tablet (10 mg total)  by mouth 3 (three) times daily as needed for muscle spasms., Disp: 90 tablet, Rfl: 1 .  diazepam (VALIUM) 10 MG tablet, Take 10 mg by mouth at bedtime. PRN, Disp: , Rfl: 2 .  hyoscyamine (LEVBID) 0.375 MG 12 hr tablet, Take 1 tablet (0.375 mg total) by mouth every 12 (twelve) hours as needed. For irritable bowel symptoms., Disp: 60 tablet, Rfl: 5 .  meloxicam (MOBIC) 15 MG tablet, TAKE 1 TABLET BY MOUTH EVERY AM WITH FOOD AFTER COMPLETED PREDNISONE. DO NOT TAKE WITH PREDNISONE, Disp: , Rfl: 3 .  Multiple Vitamin (MULITIVITAMIN WITH MINERALS) TABS, Take 1 tablet by mouth daily., Disp: , Rfl:  .  pantoprazole (PROTONIX) 40 MG tablet, TAKE 1 TABLET (40 MG TOTAL) BY MOUTH DAILY., Disp: 90 tablet, Rfl: 3 .  amoxicillin-clavulanate (AUGMENTIN) 875-125 MG tablet, Take 1 tablet by mouth 2 (two) times daily., Disp: 14 tablet, Rfl: 0  EXAM:  Vitals:   12/01/16 1559  BP: 118/84  Pulse: 87  Temp: 97.4 F (36.3 C)    Body mass index is 19.53 kg/m.  GENERAL: vitals reviewed and listed above, alert, oriented, appears well hydrated and in no acute distress  HEENT: atraumatic, conjunttiva clear, no obvious abnormalities on inspection of external nose  and ears, normal appearance of ear canals and TMs, thick nasal congestion, mild post oropharyngeal erythema with PND, no tonsillar edema or exudate, no sinus TTP  NECK: no obvious masses on inspection  LUNGS: clear to auscultation bilaterally, no wheezes, rales or rhonchi, good air movement  CV: HRRR, no peripheral edema  MS: moves all extremities without noticeable abnormality  PSYCH: pleasant and cooperative, no obvious depression or anxiety  ASSESSMENT AND PLAN:  Discussed the following assessment and plan:  Acute non-recurrent maxillary sinusitis  We discussed potential etiologies, developing sinusitis likely given duration of symptoms worsening.  We discussed treatment side effects, likely course, return precautions, antibiotic misuse,  transmission, and signs of developing a serious illness. Opted to treat with Augmentin and tessalon. -of course, we advised to return or notify a doctor immediately if symptoms worsen or persist or new concerns arise.    Patient Instructions  Please quit smoking.  Take the antibiotic as prescribed (Augmentin).  Take a probiotic such as Align for a few weeks.  Use the cough medication (tessalon) as needed.     Sinusitis, Adult Sinusitis is soreness and inflammation of your sinuses. Sinuses are hollow spaces in the bones around your face. They are located:  Around your eyes.  In the middle of your forehead.  Behind your nose.  In your cheekbones. Your sinuses and nasal passages are lined with a stringy fluid (mucus). Mucus normally drains out of your sinuses. When your nasal tissues get inflamed or swollen, the mucus can get trapped or blocked so air cannot flow through your sinuses. This lets bacteria, viruses, and funguses grow, and that leads to infection. Follow these instructions at home: Medicines  Take, use, or apply over-the-counter and prescription medicines only as told by your doctor. These may include nasal sprays.  If you were prescribed an antibiotic medicine, take it as told by your doctor. Do not stop taking the antibiotic even if you start to feel better. Hydrate and Humidify  Drink enough water to keep your pee (urine) clear or pale yellow.  Use a cool mist humidifier to keep the humidity level in your home above 50%.  Breathe in steam for 10-15 minutes, 3-4 times a day or as told by your doctor. You can do this in the bathroom while a hot shower is running.  Try not to spend time in cool or dry air. Rest  Rest as much as possible.  Sleep with your head raised (elevated).  Make sure to get enough sleep each night. General instructions  Put a warm, moist washcloth on your face 3-4 times a day or as told by your doctor. This will help with  discomfort.  Wash your hands often with soap and water. If there is no soap and water, use hand sanitizer.  Do not smoke. Avoid being around people who are smoking (secondhand smoke).  Keep all follow-up visits as told by your doctor. This is important. Contact a doctor if:  You have a fever.  Your symptoms get worse.  Your symptoms do not get better within 10 days. Get help right away if:  You have a very bad headache.  You cannot stop throwing up (vomiting).  You have pain or swelling around your face or eyes.  You have trouble seeing.  You feel confused.  Your neck is stiff.  You have trouble breathing. This information is not intended to replace advice given to you by your health care provider. Make sure you discuss any questions you  have with your health care provider. Document Released: 05/09/2008 Document Revised: 07/17/2016 Document Reviewed: 09/16/2015 Elsevier Interactive Patient Education  2017 [place]

## 2016-12-01 NOTE — Patient Instructions (Signed)
Please quit smoking.  Take the antibiotic as prescribed (Augmentin).  Take a probiotic such as Align for a few weeks.  Use the cough medication (tessalon) as needed.     Sinusitis, Adult Sinusitis is soreness and inflammation of your sinuses. Sinuses are hollow spaces in the bones around your face. They are located:  Around your eyes.  In the middle of your forehead.  Behind your nose.  In your cheekbones. Your sinuses and nasal passages are lined with a stringy fluid (mucus). Mucus normally drains out of your sinuses. When your nasal tissues get inflamed or swollen, the mucus can get trapped or blocked so air cannot flow through your sinuses. This lets bacteria, viruses, and funguses grow, and that leads to infection. Follow these instructions at home: Medicines  Take, use, or apply over-the-counter and prescription medicines only as told by your doctor. These may include nasal sprays.  If you were prescribed an antibiotic medicine, take it as told by your doctor. Do not stop taking the antibiotic even if you start to feel better. Hydrate and Humidify  Drink enough water to keep your pee (urine) clear or pale yellow.  Use a cool mist humidifier to keep the humidity level in your home above 50%.  Breathe in steam for 10-15 minutes, 3-4 times a day or as told by your doctor. You can do this in the bathroom while a hot shower is running.  Try not to spend time in cool or dry air. Rest  Rest as much as possible.  Sleep with your head raised (elevated).  Make sure to get enough sleep each night. General instructions  Put a warm, moist washcloth on your face 3-4 times a day or as told by your doctor. This will help with discomfort.  Wash your hands often with soap and water. If there is no soap and water, use hand sanitizer.  Do not smoke. Avoid being around people who are smoking (secondhand smoke).  Keep all follow-up visits as told by your doctor. This is  important. Contact a doctor if:  You have a fever.  Your symptoms get worse.  Your symptoms do not get better within 10 days. Get help right away if:  You have a very bad headache.  You cannot stop throwing up (vomiting).  You have pain or swelling around your face or eyes.  You have trouble seeing.  You feel confused.  Your neck is stiff.  You have trouble breathing. This information is not intended to replace advice given to you by your health care provider. Make sure you discuss any questions you have with your health care provider. Document Released: 05/09/2008 Document Revised: 07/17/2016 Document Reviewed: 09/16/2015 Elsevier Interactive Patient Education  2017 [employer].

## 2016-12-01 NOTE — Progress Notes (Signed)
Pre visit review using our clinic review tool, if applicable. No additional management support is needed unless otherwise documented below in the visit note. 

## 2016-12-09 ENCOUNTER — Ambulatory Visit (INDEPENDENT_AMBULATORY_CARE_PROVIDER_SITE_OTHER)
Admission: RE | Admit: 2016-12-09 | Discharge: 2016-12-09 | Disposition: A | Discharge status: Home / Self Care | Payer: PPO | Source: Ambulatory Visit | Attending: Acute Care | Admitting: Acute Care

## 2016-12-09 DIAGNOSIS — Z87891 Personal history of nicotine dependence: Secondary | ICD-10-CM | POA: Diagnosis not present

## 2016-12-09 DIAGNOSIS — F17210 Nicotine dependence, cigarettes, uncomplicated: Principal | ICD-10-CM

## 2016-12-13 ENCOUNTER — Other Ambulatory Visit: Payer: Self-pay | Admitting: Acute Care

## 2016-12-13 DIAGNOSIS — F17210 Nicotine dependence, cigarettes, uncomplicated: Secondary | ICD-10-CM

## 2016-12-16 ENCOUNTER — Telehealth: Payer: Self-pay | Admitting: Acute Care

## 2016-12-16 NOTE — Telephone Encounter (Signed)
I called Lindsey Rowland with the results of her LDCT. I explained that the scan was read as a Lung RADS 2: nodules that are benign in appearance and behavior with a very low likelihood of becoming a clinically active cancer due to size or lack of growth. Recommendation per radiology is for a repeat LDCT in 12 months. I told her we will order and schedule the scan in January of 2019. She verbalized understanding of the above. I also told her that there was indication of Coronary artery and thoracoabdominal aortic atherosclerosis.I explained that this is an ungated exam, and that degree or severity cannot be determined. She states that she does get her cholesterol and triglycerides checked on an annual basis. I told her I had sent a copy of the exam to her PCP, and I have asked her to follow up with him.She verbalized understanding of the above and had no further questions at completion of the call.

## 2016-12-16 NOTE — Telephone Encounter (Signed)
Pt returning Magnus Sinning call. Will route to Judson Roch

## 2016-12-16 NOTE — Telephone Encounter (Signed)
Have called the patient with results. Please see telephone note. Nothing further needed.

## 2017-01-13 ENCOUNTER — Other Ambulatory Visit (INDEPENDENT_AMBULATORY_CARE_PROVIDER_SITE_OTHER): Payer: PPO

## 2017-01-13 DIAGNOSIS — Z0000 Encounter for general adult medical examination without abnormal findings: Secondary | ICD-10-CM | POA: Diagnosis not present

## 2017-01-13 LAB — HEPATIC FUNCTION PANEL
ALBUMIN: 4.2 g/dL (ref 3.5–5.2)
ALT: 13 U/L (ref 0–35)
AST: 18 U/L (ref 0–37)
Alkaline Phosphatase: 70 U/L (ref 39–117)
Bilirubin, Direct: 0.2 mg/dL (ref 0.0–0.3)
Total Bilirubin: 0.9 mg/dL (ref 0.2–1.2)
Total Protein: 6.3 g/dL (ref 6.0–8.3)

## 2017-01-13 LAB — CBC WITH DIFFERENTIAL/PLATELET
BASOS ABS: 0 10*3/uL (ref 0.0–0.1)
Basophils Relative: 0.5 % (ref 0.0–3.0)
EOS ABS: 0.1 10*3/uL (ref 0.0–0.7)
Eosinophils Relative: 1.9 % (ref 0.0–5.0)
HEMATOCRIT: 43.4 % (ref 36.0–46.0)
Hemoglobin: 14.8 g/dL (ref 12.0–15.0)
LYMPHS PCT: 32.3 % (ref 12.0–46.0)
Lymphs Abs: 1.5 10*3/uL (ref 0.7–4.0)
MCHC: 34.1 g/dL (ref 30.0–36.0)
MCV: 99.3 fl (ref 78.0–100.0)
MONO ABS: 0.4 10*3/uL (ref 0.1–1.0)
Monocytes Relative: 8.4 % (ref 3.0–12.0)
NEUTROS ABS: 2.6 10*3/uL (ref 1.4–7.7)
Neutrophils Relative %: 56.9 % (ref 43.0–77.0)
Platelets: 268 10*3/uL (ref 150.0–400.0)
RBC: 4.37 Mil/uL (ref 3.87–5.11)
RDW: 13.8 % (ref 11.5–15.5)
WBC: 4.6 10*3/uL (ref 4.0–10.5)

## 2017-01-13 LAB — BASIC METABOLIC PANEL
BUN: 11 mg/dL (ref 6–23)
CALCIUM: 9.3 mg/dL (ref 8.4–10.5)
CO2: 28 meq/L (ref 19–32)
CREATININE: 0.67 mg/dL (ref 0.40–1.20)
Chloride: 110 mEq/L (ref 96–112)
GFR: 93.46 mL/min (ref >60.00)
Glucose, Bld: 85 mg/dL (ref 70–99)
Potassium: 4 mEq/L (ref 3.5–5.1)
Sodium: 142 mEq/L (ref 135–145)

## 2017-01-13 LAB — LIPID PANEL
CHOL/HDL RATIO: 3
Cholesterol: 190 mg/dL (ref 0–200)
HDL: 57.7 mg/dL (ref >39.00)
LDL Cholesterol: 115 mg/dL — ABNORMAL HIGH (ref 0–99)
NONHDL: 132.56
Triglycerides: 90 mg/dL (ref 0.0–149.0)
VLDL: 18 mg/dL (ref 0.0–40.0)

## 2017-01-13 LAB — POC URINALSYSI DIPSTICK (AUTOMATED)
BILIRUBIN UA: NEGATIVE
Glucose, UA: NEGATIVE
KETONES UA: NEGATIVE
Nitrite, UA: NEGATIVE
PH UA: 7.5
Protein, UA: NEGATIVE
SPEC GRAV UA: 1.02
Urobilinogen, UA: 0.2

## 2017-01-13 LAB — TSH: TSH: 0.81 u[IU]/mL (ref 0.35–4.50)

## 2017-01-20 ENCOUNTER — Ambulatory Visit (INDEPENDENT_AMBULATORY_CARE_PROVIDER_SITE_OTHER): Payer: PPO | Admitting: Internal Medicine

## 2017-01-20 ENCOUNTER — Encounter: Payer: Self-pay | Admitting: Internal Medicine

## 2017-01-20 VITALS — BP 128/62 | HR 98 | Temp 98.6°F | Ht 66.0 in | Wt 116.8 lb

## 2017-01-20 DIAGNOSIS — G2581 Restless legs syndrome: Secondary | ICD-10-CM | POA: Insufficient documentation

## 2017-01-20 DIAGNOSIS — Z0000 Encounter for general adult medical examination without abnormal findings: Secondary | ICD-10-CM | POA: Insufficient documentation

## 2017-01-20 DIAGNOSIS — Z23 Encounter for immunization: Secondary | ICD-10-CM

## 2017-01-20 HISTORY — DX: Restless legs syndrome: G25.81

## 2017-01-20 MED ORDER — ROPINIROLE HCL 0.5 MG PO TABS: 0.5000 mg | ORAL_TABLET | Freq: Every day | ORAL | 3 refills | Status: DC

## 2017-01-20 NOTE — Patient Instructions (Addendum)
Limit your sodium (Salt) intake    It is important that you exercise regularly, at least 20 minutes 3 to 4 times per week.  If you develop chest pain or shortness of breath seek  medical attention.  Take a calcium supplement, plus [phone] units of vitamin D  Smoking tobacco is very bad for your health. You should stop smoking immediately.  Return in one year for follow-up   Health Maintenance for Postmenopausal Women Introduction Menopause is a normal process in which your reproductive ability comes to an end. This process happens gradually over a span of months to years, usually between the ages of 61 and 79. Menopause is complete when you have missed 12 consecutive menstrual periods. It is important to talk with your health care provider about some of the most common conditions that affect postmenopausal women, such as heart disease, cancer, and bone loss (osteoporosis). Adopting a healthy lifestyle and getting preventive care can help to promote your health and wellness. Those actions can also lower your chances of developing some of these common conditions. What should I know about menopause? During menopause, you may experience a number of symptoms, such as:  Moderate-to-severe hot flashes.  Night sweats.  Decrease in sex drive.  Mood swings.  Headaches.  Tiredness.  Irritability.  Memory problems.  Insomnia. Choosing to treat or not to treat menopausal changes is an individual decision that you make with your health care provider. What should I know about hormone replacement therapy and supplements? Hormone therapy products are effective for treating symptoms that are associated with menopause, such as hot flashes and night sweats. Hormone replacement carries certain risks, especially as you become older. If you are thinking about using estrogen or estrogen with progestin treatments, discuss the benefits and risks with your health care provider. What should I know about  heart disease and stroke? Heart disease, heart attack, and stroke become more likely as you age. This may be due, in part, to the hormonal changes that your body experiences during menopause. These can affect how your body processes dietary fats, triglycerides, and cholesterol. Heart attack and stroke are both medical emergencies. There are many things that you can do to help prevent heart disease and stroke:  Have your blood pressure checked at least every 1-2 years. High blood pressure causes heart disease and increases the risk of stroke.  If you are 81-21 years old, ask your health care provider if you should take aspirin to prevent a heart attack or a stroke.  Do not use any tobacco products, including cigarettes, chewing tobacco, or electronic cigarettes. If you need help quitting, ask your health care provider.  It is important to eat a healthy diet and maintain a healthy weight.  Be sure to include plenty of vegetables, fruits, low-fat dairy products, and lean protein.  Avoid eating foods that are high in solid fats, added sugars, or salt (sodium).  Get regular exercise. This is one of the most important things that you can do for your health.  Try to exercise for at least 150 minutes each week. The type of exercise that you do should increase your heart rate and make you sweat. This is known as moderate-intensity exercise.  Try to do strengthening exercises at least twice each week. Do these in addition to the moderate-intensity exercise.  Know your numbers.Ask your health care provider to check your cholesterol and your blood glucose. Continue to have your blood tested as directed by your health care provider. What should  I know about cancer screening? There are several types of cancer. Take the following steps to reduce your risk and to catch any cancer development as early as possible. Breast Cancer  Practice breast self-awareness.  This means understanding how your breasts  normally appear and feel.  It also means doing regular breast self-exams. Let your health care provider know about any changes, no matter how small.  If you are 56 or older, have a clinician do a breast exam (clinical breast exam or CBE) every year. Depending on your age, family history, and medical history, it may be recommended that you also have a yearly breast X-ray (mammogram).  If you have a family history of breast cancer, talk with your health care provider about genetic screening.  If you are at high risk for breast cancer, talk with your health care provider about having an MRI and a mammogram every year.  Breast cancer (BRCA) gene test is recommended for women who have family members with BRCA-related cancers. Results of the assessment will determine the need for genetic counseling and BRCA1 and for BRCA2 testing. BRCA-related cancers include these types:  Breast. This occurs in males or females.  Ovarian.  Tubal. This may also be called fallopian tube cancer.  Cancer of the abdominal or pelvic lining (peritoneal cancer).  Prostate.  Pancreatic. Cervical, Uterine, and Ovarian Cancer  Your health care provider may recommend that you be screened regularly for cancer of the pelvic organs. These include your ovaries, uterus, and vagina. This screening involves a pelvic exam, which includes checking for microscopic changes to the surface of your cervix (Pap test).  For women ages 21-65, health care providers may recommend a pelvic exam and a Pap test every three years. For women ages 23-65, they may recommend the Pap test and pelvic exam, combined with testing for human papilloma virus (HPV), every five years. Some types of HPV increase your risk of cervical cancer. Testing for HPV may also be done on women of any age who have unclear Pap test results.  Other health care providers may not recommend any screening for nonpregnant women who are considered low risk for pelvic cancer and  have no symptoms. Ask your health care provider if a screening pelvic exam is right for you.  If you have had past treatment for cervical cancer or a condition that could lead to cancer, you need Pap tests and screening for cancer for at least 20 years after your treatment. If Pap tests have been discontinued for you, your risk factors (such as having a new sexual partner) need to be reassessed to determine if you should start having screenings again. Some women have medical problems that increase the chance of getting cervical cancer. In these cases, your health care provider may recommend that you have screening and Pap tests more often.  If you have a family history of uterine cancer or ovarian cancer, talk with your health care provider about genetic screening.  If you have vaginal bleeding after reaching menopause, tell your health care provider.  There are currently no reliable tests available to screen for ovarian cancer. Lung Cancer  Lung cancer screening is recommended for adults 18-70 years old who are at high risk for lung cancer because of a history of smoking. A yearly low-dose CT scan of the lungs is recommended if you:  Currently smoke.  Have a history of at least 30 pack-years of smoking and you currently smoke or have quit within the past 15 years.  A pack-year is smoking an average of one pack of cigarettes per day for one year. Yearly screening should:  Continue until it has been 15 years since you quit.  Stop if you develop a health problem that would prevent you from having lung cancer treatment. Colorectal Cancer  This type of cancer can be detected and can often be prevented.  Routine colorectal cancer screening usually begins at age 53 and continues through age 24.  If you have risk factors for colon cancer, your health care provider may recommend that you be screened at an earlier age.  If you have a family history of colorectal cancer, talk with your health care  provider about genetic screening.  Your health care provider may also recommend using home test kits to check for hidden blood in your stool.  A small camera at the end of a tube can be used to examine your colon directly (sigmoidoscopy or colonoscopy). This is done to check for the earliest forms of colorectal cancer.  Direct examination of the colon should be repeated every 5-10 years until age 107. However, if early forms of precancerous polyps or small growths are found or if you have a family history or genetic risk for colorectal cancer, you may need to be screened more often. Skin Cancer  Check your skin from head to toe regularly.  Monitor any moles. Be sure to tell your health care provider:  About any new moles or changes in moles, especially if there is a change in a mole's shape or color.  If you have a mole that is larger than the size of a pencil eraser.  If any of your family members has a history of skin cancer, especially at a young age, talk with your health care provider about genetic screening.  Always use sunscreen. Apply sunscreen liberally and repeatedly throughout the day.  Whenever you are outside, protect yourself by wearing long sleeves, pants, a wide-brimmed hat, and sunglasses. What should I know about osteoporosis? Osteoporosis is a condition in which bone destruction happens more quickly than new bone creation. After menopause, you may be at an increased risk for osteoporosis. To help prevent osteoporosis or the bone fractures that can happen because of osteoporosis, the following is recommended:  If you are 70-97 years old, get at least 1,000 mg of calcium and at least 600 mg of vitamin D per day.  If you are older than age 56 but younger than age 70, get at least 1,200 mg of calcium and at least 600 mg of vitamin D per day.  If you are older than age 72, get at least 1,200 mg of calcium and at least 800 mg of vitamin D per day. Smoking and excessive  alcohol intake increase the risk of osteoporosis. Eat foods that are rich in calcium and vitamin D, and do weight-bearing exercises several times each week as directed by your health care provider. What should I know about how menopause affects my mental health? Depression may occur at any age, but it is more common as you become older. Common symptoms of depression include:  Low or sad mood.  Changes in sleep patterns.  Changes in appetite or eating patterns.  Feeling an overall lack of motivation or enjoyment of activities that you previously enjoyed.  Frequent crying spells. Talk with your health care provider if you think that you are experiencing depression. What should I know about immunizations? It is important that you get and maintain your immunizations. These  immunizations. These include:  Tetanus, diphtheria, and pertussis (Tdap) booster vaccine.  Influenza every year before the flu season begins.  Pneumonia vaccine.  Shingles vaccine. Your health care provider may also recommend other immunizations. This information is not intended to replace advice given to you by your health care provider. Make sure you discuss any questions you have with your health care provider. Document Released: 01/13/2006 Document Revised: 06/10/2016 Document Reviewed: 08/25/2015  2017 [employer]  

## 2017-01-20 NOTE — Progress Notes (Signed)
Subjective:    Patient ID: Lindsey Rowland, female    DOB: 1951-08-18, 66 y.o.   MRN: [id number]  HPI  66   year-old patient who is a former patient of Dr. Jerene Canny. She presents today for an annual health examinationnt.   She does have a history of COPD and on going low-grade tobacco use. She smokes 5-6 cigarettes per day. She does have albuterol rescue medication that has not been required.  Last month she had a low dose chest CT screening.  She is scheduled for follow-up mammogram Colonoscopy January 2016  Medical records reviewed. She does have a history of osteoarthritis and  has been  on Boniva for osteoporosis. She has remote history of ulcer disease and uses Protonix. She has hypertension controlled with Zestril.  She receives annual gynecologic care in [facility] Her initial colonoscopy was in 2003 by Dr. Lajoyce Corners in follow-up in January 2016  She is followed by [facility] orthopedics for low back pain  She has been on hydrocodone and Valium and has received regular refills.  Her past. Two Urine drug screen have been negative for metabolites  Social history recently retired.  Continues via low-volume tobacco user  Is requesting a prescription for Requip for restless leg syndrome.  Presently she takes diazepam when necessary  Past Medical History:  Diagnosis Date  . Arthritis   . COPD (chronic obstructive pulmonary disease) ([place])   . Headache(784.0)   . Osteopenia   . Polio   . Ulcer [facility])      Social History   Social History  . Marital status: Married    Spouse name: N/A  . Number of children: N/A  . Years of education: N/A   Occupational History  . Not on file.   Social History Main Topics  . Smoking status: Current Every Day Smoker    Packs/day: 0.75    Years: 40.00    Types: Cigarettes  . Smokeless tobacco: Never Used     Comment: pt uses electronic cigarettes   . Alcohol use 0.0 oz/week     Comment: 2 beers per week  . Drug use: No  . Sexual  activity: No   Other Topics Concern  . Not on file   Social History Narrative  . No narrative on file    Past Surgical History:  Procedure Laterality Date  . APPENDECTOMY    . CATARACT EXTRACTION     right  . DILATION AND CURETTAGE OF UTERUS    . RETINAL DETACHMENT SURGERY  12/2010   right eye  . TONSILLECTOMY      Family History  Problem Relation Age of Onset  . Colon cancer Neg Hx   . Esophageal cancer Neg Hx   . Stomach cancer Neg Hx   . Rectal cancer Neg Hx   . Hiatal hernia Father     Allergies  Allergen Reactions  . Amoxicillin Diarrhea    Current Outpatient Prescriptions on File Prior to Visit  Medication Sig Dispense Refill  . albuterol (PROAIR HFA) 108 (90 Base) MCG/ACT inhaler Inhale 2 puffs into the lungs every 6 (six) hours as needed. For shortness of breath. 1 Inhaler 6  . Aspirin-Salicylamide-Caffeine (BC HEADACHE POWDER PO) Take 1 packet by mouth daily as needed. For pain.    . cyclobenzaprine (FLEXERIL) 10 MG tablet Take 1 tablet (10 mg total) by mouth 3 (three) times daily as needed for muscle spasms. 90 tablet 1  . diazepam (VALIUM) 10 MG tablet Take 10 mg  by mouth at bedtime. PRN  2  . hyoscyamine (LEVBID) 0.375 MG 12 hr tablet Take 1 tablet (0.375 mg total) by mouth every 12 (twelve) hours as needed. For irritable bowel symptoms. 60 tablet 5  . meloxicam (MOBIC) 15 MG tablet TAKE 1 TABLET BY MOUTH EVERY AM WITH FOOD AFTER COMPLETED PREDNISONE. DO NOT TAKE WITH PREDNISONE  3  . Multiple Vitamin (MULITIVITAMIN WITH MINERALS) TABS Take 1 tablet by mouth daily.    . pantoprazole (PROTONIX) 40 MG tablet TAKE 1 TABLET (40 MG TOTAL) BY MOUTH DAILY. 90 tablet 3   No current facility-administered medications on file prior to visit.     BP 128/62 (BP Location: Left Arm, Patient Position: Sitting, Cuff Size: Normal)   Pulse 98   Temp 98.6 F (37 C) (Oral)   Ht '5\' 6"'$  (1.676 m)   Wt 116 lb 12.8 oz (53 kg)   SpO2 98%   BMI 18.85 kg/m     Review of  Systems  Constitutional: Negative.   HENT: Negative for congestion, dental problem, hearing loss, rhinorrhea, sinus pressure, sore throat and tinnitus.   Eyes: Negative for pain, discharge and visual disturbance.  Respiratory: Negative for cough and shortness of breath.   Cardiovascular: Negative for chest pain, palpitations and leg swelling.  Gastrointestinal: Negative for abdominal distention, abdominal pain, blood in stool, constipation, diarrhea, nausea and vomiting.  Genitourinary: Negative for difficulty urinating, dysuria, flank pain, frequency, hematuria, pelvic pain, urgency, vaginal bleeding, vaginal discharge and vaginal pain.  Musculoskeletal: Positive for back pain. Negative for arthralgias, gait problem and joint swelling.  Skin: Negative for rash.  Neurological: Negative for dizziness, syncope, speech difficulty, weakness, numbness and headaches.  Hematological: Negative for adenopathy.  Psychiatric/Behavioral: Negative for agitation, behavioral problems and dysphoric mood. The patient is not nervous/anxious.        Objective:   Physical Exam  Constitutional: She is oriented to person, place, and time. She appears well-developed and well-nourished.  HENT:  Head: Normocephalic and atraumatic.  Right Ear: External ear normal.  Left Ear: External ear normal.  Mouth/Throat: Oropharynx is clear and moist.  Eyes: Conjunctivae and EOM are normal.  Neck: Normal range of motion. Neck supple. No JVD present. No thyromegaly present.  Cardiovascular: Normal rate, regular rhythm, normal heart sounds and intact distal pulses.   No murmur heard. Decreased right dorsalis pedis pulse  Pulmonary/Chest: Effort normal and breath sounds normal. She has no wheezes. She has no rales.  Abdominal: Soft. Bowel sounds are normal. She exhibits no distension and no mass. There is no tenderness. There is no rebound and no guarding.  Genitourinary: Vagina normal.  Musculoskeletal: Normal range of  motion. She exhibits no edema or tenderness.  Neurological: She is alert and oriented to person, place, and time. She has normal reflexes. No cranial nerve deficit. She exhibits normal muscle tone. Coordination normal.  Skin: Skin is warm and dry. No rash noted.  Psychiatric: She has a normal mood and affect. Her behavior is normal.          Assessment & Plan:   Preventive health examination Mild COPD Generalized anxiety disorder History of peptic ulcer disease.  Patient will consider a challenge off PPI therapy Low back pain History of hypertension.  Blood pressure presently controlled off medication History of restless leg syndrome.  Will give a trial of Requip per her request.  This has worked well for her daughter  Laboratory studies reviewed GYN follow-up Return here one year or as needed  [employer]  Pilar Plate

## 2017-01-20 NOTE — Progress Notes (Signed)
Pre visit review using our clinic review tool, if applicable. No additional management support is needed unless otherwise documented below in the visit note. 

## 2017-01-23 ENCOUNTER — Other Ambulatory Visit: Payer: Self-pay | Admitting: Orthopedic Surgery

## 2017-01-23 ENCOUNTER — Other Ambulatory Visit: Payer: Self-pay | Admitting: Internal Medicine

## 2017-01-23 ENCOUNTER — Other Ambulatory Visit: Payer: Self-pay | Admitting: Acute Care

## 2017-01-23 DIAGNOSIS — M533 Sacrococcygeal disorders, not elsewhere classified: Secondary | ICD-10-CM

## 2017-01-24 ENCOUNTER — Other Ambulatory Visit: Payer: PPO

## 2017-01-24 ENCOUNTER — Inpatient Hospital Stay
Admission: RE | Admit: 2017-01-24 | Discharge: 2017-01-24 | Disposition: A | Discharge status: Home / Self Care | Payer: PPO | Source: Ambulatory Visit | Attending: Orthopedic Surgery | Admitting: Orthopedic Surgery

## 2017-01-30 ENCOUNTER — Ambulatory Visit
Admission: RE | Admit: 2017-01-30 | Discharge: 2017-01-30 | Disposition: A | Discharge status: Home / Self Care | Payer: Worker's Compensation | Source: Ambulatory Visit | Attending: Orthopedic Surgery | Admitting: Orthopedic Surgery

## 2017-01-30 DIAGNOSIS — M533 Sacrococcygeal disorders, not elsewhere classified: Secondary | ICD-10-CM

## 2017-02-06 ENCOUNTER — Other Ambulatory Visit: Payer: Self-pay | Admitting: Orthopedic Surgery

## 2017-02-06 DIAGNOSIS — M533 Sacrococcygeal disorders, not elsewhere classified: Secondary | ICD-10-CM

## 2017-02-15 DIAGNOSIS — Z1231 Encounter for screening mammogram for malignant neoplasm of breast: Secondary | ICD-10-CM | POA: Diagnosis not present

## 2017-02-16 ENCOUNTER — Ambulatory Visit
Admission: RE | Admit: 2017-02-16 | Discharge: 2017-02-16 | Disposition: A | Discharge status: Home / Self Care | Payer: Worker's Compensation | Source: Ambulatory Visit | Attending: Orthopedic Surgery | Admitting: Orthopedic Surgery

## 2017-02-16 DIAGNOSIS — M533 Sacrococcygeal disorders, not elsewhere classified: Secondary | ICD-10-CM

## 2017-02-16 MED ORDER — METHYLPREDNISOLONE ACETATE 40 MG/ML INJ SUSP (RADIOLOG: 120.0000 mg | Freq: Once | INTRAMUSCULAR | Status: DC

## 2017-07-11 DIAGNOSIS — R05 Cough: Secondary | ICD-10-CM | POA: Diagnosis not present

## 2017-07-11 DIAGNOSIS — J40 Bronchitis, not specified as acute or chronic: Secondary | ICD-10-CM | POA: Diagnosis not present

## 2017-07-19 ENCOUNTER — Ambulatory Visit (INDEPENDENT_AMBULATORY_CARE_PROVIDER_SITE_OTHER): Payer: PPO | Admitting: Internal Medicine

## 2017-07-19 ENCOUNTER — Encounter: Payer: Self-pay | Admitting: Internal Medicine

## 2017-07-19 VITALS — BP 120/72 | HR 100 | Temp 98.3°F | Ht 66.0 in | Wt 105.4 lb

## 2017-07-19 DIAGNOSIS — R630 Anorexia: Secondary | ICD-10-CM

## 2017-07-19 DIAGNOSIS — R634 Abnormal weight loss: Secondary | ICD-10-CM

## 2017-07-19 LAB — COMPREHENSIVE METABOLIC PANEL
ALK PHOS: 65 U/L (ref 39–117)
ALT: 15 U/L (ref 0–35)
AST: 18 U/L (ref 0–37)
Albumin: 4.3 g/dL (ref 3.5–5.2)
BILIRUBIN TOTAL: 0.6 mg/dL (ref 0.2–1.2)
BUN: 24 mg/dL — ABNORMAL HIGH (ref 6–23)
CALCIUM: 9.1 mg/dL (ref 8.4–10.5)
CO2: 28 meq/L (ref 19–32)
CREATININE: 0.63 mg/dL (ref 0.40–1.20)
Chloride: 108 mEq/L (ref 96–112)
GFR: 100.18 mL/min (ref >60.00)
GLUCOSE: 89 mg/dL (ref 70–99)
Potassium: 3.9 mEq/L (ref 3.5–5.1)
Sodium: 142 mEq/L (ref 135–145)
TOTAL PROTEIN: 6 g/dL (ref 6.0–8.3)

## 2017-07-19 LAB — CBC WITH DIFFERENTIAL/PLATELET
BASOS ABS: 0 10*3/uL (ref 0.0–0.1)
Basophils Relative: 0.7 % (ref 0.0–3.0)
EOS ABS: 0.1 10*3/uL (ref 0.0–0.7)
Eosinophils Relative: 1.6 % (ref 0.0–5.0)
HCT: 41.5 % (ref 36.0–46.0)
Hemoglobin: 14 g/dL (ref 12.0–15.0)
LYMPHS ABS: 2.2 10*3/uL (ref 0.7–4.0)
Lymphocytes Relative: 34.7 % (ref 12.0–46.0)
MCHC: 33.8 g/dL (ref 30.0–36.0)
MCV: 102.9 fl — ABNORMAL HIGH (ref 78.0–100.0)
MONOS PCT: 6.8 % (ref 3.0–12.0)
Monocytes Absolute: 0.4 10*3/uL (ref 0.1–1.0)
NEUTROS ABS: 3.6 10*3/uL (ref 1.4–7.7)
Neutrophils Relative %: 56.2 % (ref 43.0–77.0)
PLATELETS: 330 10*3/uL (ref 150.0–400.0)
RBC: 4.03 Mil/uL (ref 3.87–5.11)
RDW: 13.1 % (ref 11.5–15.5)
WBC: 6.3 10*3/uL (ref 4.0–10.5)

## 2017-07-19 LAB — TSH: TSH: 0.59 u[IU]/mL (ref 0.35–4.50)

## 2017-07-19 LAB — T4, FREE: FREE T4: 1.04 ng/dL (ref 0.60–1.60)

## 2017-07-19 LAB — SEDIMENTATION RATE: Sed Rate: 4 mm/hr (ref 0–30)

## 2017-07-19 MED ORDER — ALBUTEROL SULFATE HFA 108 (90 BASE) MCG/ACT IN AERS: 2.0000 | INHALATION_SPRAY | Freq: Four times a day (QID) | RESPIRATORY_TRACT | 6 refills | Status: DC | PRN

## 2017-07-19 MED ORDER — DIAZEPAM 10 MG PO TABS: 10.0000 mg | ORAL_TABLET | Freq: Every day | ORAL | 2 refills | Status: DC

## 2017-07-19 MED ORDER — CYCLOBENZAPRINE HCL 10 MG PO TABS: 10.0000 mg | ORAL_TABLET | Freq: Three times a day (TID) | ORAL | 1 refills | Status: DC | PRN

## 2017-07-19 NOTE — Progress Notes (Signed)
Subjective:    Patient ID: Lindsey Rowland, female    DOB: 26-May-1951, 66 y.o.   MRN: [id number]  HPI  Wt Readings from Last 3 Encounters:  07/19/17 105 lb 6.4 oz (47.8 kg)  01/20/17 116 lb 12.8 oz (53 kg)  12/01/16 121 lb (74.86 kg)   66 year old patient who presents with a concern of weight loss. She states her appetite has been slightly impaired, but otherwise feels generally well.  She has had some issues with her left sacroiliac joint and has had a number of procedures including a recent ablation No change in bowel habits.  Did have a colonoscopy 2016 She had a low dose chest CT screen in January that revealed some atherosclerotic changes only.  Her appetite has been somewhat blunted since April She recently retired in January of this year, but feels good about her retirement. Remains active about the house with activities such as gardening and denies any real exercise limitations.  She does have some COPD and ongoing tobacco use.  She smokes 1 pack of cigarettes per week She has been treated recently for a URI with azithromycin Screening laboratory studies earlier this year were unremarkable  Past Medical History:  Diagnosis Date  . Arthritis   . COPD (chronic obstructive pulmonary disease) ([place])   . Headache(784.0)   . Osteopenia   . Polio   . Ulcer      Social History   Social History  . Marital status: Married    Spouse name: N/A  . Number of children: N/A  . Years of education: N/A   Occupational History  . Not on file.   Social History Main Topics  . Smoking status: Current Every Day Smoker    Packs/day: 0.75    Years: 40.00    Types: Cigarettes  . Smokeless tobacco: Never Used     Comment: pt uses electronic cigarettes   . Alcohol use 0.0 oz/week     Comment: 2 beers per week  . Drug use: No  . Sexual activity: No   Other Topics Concern  . Not on file   Social History Narrative  . No narrative on file    Past Surgical History:  Procedure  Laterality Date  . APPENDECTOMY    . CATARACT EXTRACTION     right  . DILATION AND CURETTAGE OF UTERUS    . RETINAL DETACHMENT SURGERY  12/2010   right eye  . TONSILLECTOMY      Family History  Problem Relation Age of Onset  . Colon cancer Neg Hx   . Esophageal cancer Neg Hx   . Stomach cancer Neg Hx   . Rectal cancer Neg Hx   . Hiatal hernia Father     Allergies  Allergen Reactions  . Amoxicillin Diarrhea    Current Outpatient Prescriptions on File Prior to Visit  Medication Sig Dispense Refill  . albuterol (PROAIR HFA) 108 (90 Base) MCG/ACT inhaler Inhale 2 puffs into the lungs every 6 (six) hours as needed. For shortness of breath. 1 Inhaler 6  . Aspirin-Salicylamide-Caffeine (BC HEADACHE POWDER PO) Take 1 packet by mouth daily as needed. For pain.    . cyclobenzaprine (FLEXERIL) 10 MG tablet Take 1 tablet (10 mg total) by mouth 3 (three) times daily as needed for muscle spasms. 90 tablet 1  . diazepam (VALIUM) 10 MG tablet Take 10 mg by mouth at bedtime. PRN  2  . meloxicam (MOBIC) 15 MG tablet TAKE 1 TABLET BY MOUTH EVERY AM  WITH FOOD AFTER COMPLETED PREDNISONE. DO NOT TAKE WITH PREDNISONE  3  . Multiple Vitamin (MULITIVITAMIN WITH MINERALS) TABS Take 1 tablet by mouth daily.    . pantoprazole (PROTONIX) 40 MG tablet TAKE 1 TABLET BY MOUTH DAILY. 90 tablet 2  . rOPINIRole (REQUIP) 0.5 MG tablet Take 1 tablet (0.5 mg total) by mouth at bedtime. 60 tablet 3   No current facility-administered medications on file prior to visit.     BP 120/72 (BP Location: Left Arm, Patient Position: Sitting, Cuff Size: Normal)   Pulse 100   Temp 98.3 F (36.8 C) (Oral)   Ht 5\' 6"  (1.676 m)   Wt 105 lb 6.4 oz (47.8 kg)   SpO2 97%   BMI 17.01 kg/m     Review of Systems  Constitutional: Positive for appetite change and unexpected weight change.  HENT: Negative for congestion, dental problem, hearing loss, rhinorrhea, sinus pressure, sore throat and tinnitus.   Eyes: Negative for  pain, discharge and visual disturbance.  Respiratory: Negative for cough and shortness of breath.   Cardiovascular: Negative for chest pain, palpitations and leg swelling.  Gastrointestinal: Negative for abdominal distention, abdominal pain, blood in stool, constipation, diarrhea, nausea and vomiting.  Genitourinary: Negative for difficulty urinating, dysuria, flank pain, frequency, hematuria, pelvic pain, urgency, vaginal bleeding, vaginal discharge and vaginal pain.  Musculoskeletal: Negative for arthralgias, gait problem and joint swelling.  Skin: Negative for rash.  Neurological: Negative for dizziness, syncope, speech difficulty, weakness, numbness and headaches.  Hematological: Negative for adenopathy.  Psychiatric/Behavioral: Negative for agitation, behavioral problems and dysphoric mood. The patient is not nervous/anxious.        Objective:   Physical Exam  Constitutional: She is oriented to person, place, and time. She appears well-developed and well-nourished.  Deeply tanned  Appears well.  Blood pressure normal  HENT:  Head: Normocephalic.  Right Ear: External ear normal.  Left Ear: External ear normal.  Mouth/Throat: Oropharynx is clear and moist.  Eyes: Pupils are equal, round, and reactive to light. Conjunctivae and EOM are normal.  Neck: Normal range of motion. Neck supple. No thyromegaly present.  Cardiovascular: Normal rate, regular rhythm, normal heart sounds and intact distal pulses.   Decreased right dorsalis pedis pulse  Pulmonary/Chest: Effort normal and breath sounds normal.  Abdominal: Soft. Bowel sounds are normal. She exhibits no mass. There is no tenderness.  Musculoskeletal: Normal range of motion.  Lymphadenopathy:    She has no cervical adenopathy.  Neurological: She is alert and oriented to person, place, and time.  Skin: Skin is warm and dry. No rash noted.  Psychiatric: She has a normal mood and affect. Her behavior is normal.            Assessment & Plan:   Weight loss/anorexia Coronary artery calcification.  Will discuss statin therapy at a later date Ongoing tobacco use.  History of negative low-dose chest CT screening Chronic pain, left SI joint  The patient will attempt to liberalize her caloric intake.  Will check follow-up lab including sedimentation rate and thyroid studies Follow-up 4 weeks  Kieth Hartis Pilar Plate

## 2017-07-19 NOTE — Patient Instructions (Signed)
Liberalize your  caloric intake   NO RESTRICTIONS  Return in 4 weeks for follow-up

## 2017-08-16 ENCOUNTER — Ambulatory Visit (INDEPENDENT_AMBULATORY_CARE_PROVIDER_SITE_OTHER): Payer: PPO | Admitting: Internal Medicine

## 2017-08-16 ENCOUNTER — Encounter: Payer: Self-pay | Admitting: Internal Medicine

## 2017-08-16 VITALS — BP 152/80 | HR 76 | Temp 98.0°F | Ht 66.0 in | Wt 106.8 lb

## 2017-08-16 DIAGNOSIS — I10 Essential (primary) hypertension: Secondary | ICD-10-CM

## 2017-08-16 NOTE — Progress Notes (Signed)
Subjective:    Patient ID: Lindsey Rowland, female    DOB: March 06, 1951, 66 y.o.   MRN: [id number]  HPI  Wt Readings from Last 3 Encounters:  08/16/17 106 lb 12.8 oz (48.4 kg)  07/19/17 105 lb 6.4 oz (47.8 kg)  01/20/17 116 lb 12.8 oz (35 kg)   66 year old patient who is seen today in follow-up. For the past month she is maintaining her weight.  She remains active and generally feels well. Recent laboratory screen unremarkable  Past Medical History:  Diagnosis Date  . Arthritis   . COPD (chronic obstructive pulmonary disease) ([place])   . Headache(784.0)   . Osteopenia   . Polio   . Ulcer      Social History   Social History  . Marital status: Married    Spouse name: N/A  . Number of children: N/A  . Years of education: N/A   Occupational History  . Not on file.   Social History Main Topics  . Smoking status: Current Every Day Smoker    Packs/day: 0.75    Years: 40.00    Types: Cigarettes  . Smokeless tobacco: Never Used     Comment: pt uses electronic cigarettes   . Alcohol use 0.0 oz/week     Comment: 2 beers per week  . Drug use: No  . Sexual activity: No   Other Topics Concern  . Not on file   Social History Narrative  . No narrative on file    Past Surgical History:  Procedure Laterality Date  . APPENDECTOMY    . CATARACT EXTRACTION     right  . DILATION AND CURETTAGE OF UTERUS    . RETINAL DETACHMENT SURGERY  12/2010   right eye  . TONSILLECTOMY      Family History  Problem Relation Age of Onset  . Colon cancer Neg Hx   . Esophageal cancer Neg Hx   . Stomach cancer Neg Hx   . Rectal cancer Neg Hx   . Hiatal hernia Father     Allergies  Allergen Reactions  . Amoxicillin Diarrhea    Current Outpatient Prescriptions on File Prior to Visit  Medication Sig Dispense Refill  . albuterol (PROAIR HFA) 108 (90 Base) MCG/ACT inhaler Inhale 2 puffs into the lungs every 6 (six) hours as needed. For shortness of breath. 1 Inhaler 6  .  Aspirin-Salicylamide-Caffeine (BC HEADACHE POWDER PO) Take 1 packet by mouth daily as needed. For pain.    . cyclobenzaprine (FLEXERIL) 10 MG tablet Take 1 tablet (10 mg total) by mouth 3 (three) times daily as needed for muscle spasms. 90 tablet 1  . diazepam (VALIUM) 10 MG tablet Take 1 tablet (10 mg total) by mouth at bedtime. PRN 30 tablet 2  . meloxicam (MOBIC) 15 MG tablet TAKE 1 TABLET BY MOUTH EVERY AM WITH FOOD AFTER COMPLETED PREDNISONE. DO NOT TAKE WITH PREDNISONE  3  . Multiple Vitamin (MULITIVITAMIN WITH MINERALS) TABS Take 1 tablet by mouth daily.    . pantoprazole (PROTONIX) 40 MG tablet TAKE 1 TABLET BY MOUTH DAILY. 90 tablet 2  . rOPINIRole (REQUIP) 0.5 MG tablet Take 1 tablet (0.5 mg total) by mouth at bedtime. 60 tablet 3   No current facility-administered medications on file prior to visit.     BP (!) 152/80 (BP Location: Left Arm, Patient Position: Sitting, Cuff Size: Normal)   Pulse 76   Temp 98 F (36.7 C) (Oral)   Ht 5\' 6"  (1.676 m)  Wt 106 lb 12.8 oz (48.4 kg)   SpO2 98%   BMI 17.24 kg/m    Review of Systems  Constitutional: Positive for unexpected weight change.  HENT: Negative for congestion, dental problem, hearing loss, rhinorrhea, sinus pressure, sore throat and tinnitus.   Eyes: Negative for pain, discharge and visual disturbance.  Respiratory: Negative for cough and shortness of breath.   Cardiovascular: Negative for chest pain, palpitations and leg swelling.  Gastrointestinal: Negative for abdominal distention, abdominal pain, blood in stool, constipation, diarrhea, nausea and vomiting.  Genitourinary: Negative for difficulty urinating, dysuria, flank pain, frequency, hematuria, pelvic pain, urgency, vaginal bleeding, vaginal discharge and vaginal pain.  Musculoskeletal: Negative for arthralgias, gait problem and joint swelling.  Skin: Negative for rash.  Neurological: Negative for dizziness, syncope, speech difficulty, weakness, numbness and  headaches.  Hematological: Negative for adenopathy.  Psychiatric/Behavioral: Negative for agitation, behavioral problems and dysphoric mood. The patient is not nervous/anxious.        Objective:   Physical Exam  Constitutional: She is oriented to person, place, and time. She appears well-developed and well-nourished.  HENT:  Head: Normocephalic.  Right Ear: External ear normal.  Left Ear: External ear normal.  Mouth/Throat: Oropharynx is clear and moist.  Eyes: Pupils are equal, round, and reactive to light. Conjunctivae and EOM are normal.  Neck: Normal range of motion. Neck supple. No thyromegaly present.  Cardiovascular: Normal rate, regular rhythm, normal heart sounds and intact distal pulses.   Pulmonary/Chest: Effort normal and breath sounds normal.  Abdominal: Soft. Bowel sounds are normal. She exhibits no mass. There is no tenderness.  Musculoskeletal: Normal range of motion.  Lymphadenopathy:    She has no cervical adenopathy.  Neurological: She is alert and oriented to person, place, and time.  Skin: Skin is warm and dry. No rash noted.  Psychiatric: She has a normal mood and affect. Her behavior is normal.          Assessment & Plan:   History weight loss.  Weight for the past month has been stable.  Examination.  Laboratory screen unremarkable.  Will continue to observe.  Patient will report any further weight loss.  Otherwise, will return as scheduled for her annual exam  Lindsey Rowland

## 2017-08-16 NOTE — Patient Instructions (Signed)
Report any significant weight loss  Return in one year for your annual exam

## 2017-09-05 ENCOUNTER — Ambulatory Visit (INDEPENDENT_AMBULATORY_CARE_PROVIDER_SITE_OTHER): Payer: PPO

## 2017-09-05 DIAGNOSIS — Z23 Encounter for immunization: Secondary | ICD-10-CM

## 2017-10-17 ENCOUNTER — Other Ambulatory Visit: Payer: Self-pay | Admitting: Internal Medicine

## 2017-10-18 ENCOUNTER — Other Ambulatory Visit: Payer: Self-pay | Admitting: Internal Medicine

## 2017-11-14 DIAGNOSIS — R05 Cough: Secondary | ICD-10-CM | POA: Diagnosis not present

## 2017-11-14 DIAGNOSIS — J441 Chronic obstructive pulmonary disease with (acute) exacerbation: Secondary | ICD-10-CM | POA: Diagnosis not present

## 2017-11-14 DIAGNOSIS — R918 Other nonspecific abnormal finding of lung field: Secondary | ICD-10-CM | POA: Diagnosis not present

## 2017-12-04 DIAGNOSIS — J449 Chronic obstructive pulmonary disease, unspecified: Secondary | ICD-10-CM | POA: Diagnosis not present

## 2017-12-06 DIAGNOSIS — H01022 Squamous blepharitis right lower eyelid: Secondary | ICD-10-CM | POA: Diagnosis not present

## 2017-12-06 DIAGNOSIS — H01025 Squamous blepharitis left lower eyelid: Secondary | ICD-10-CM | POA: Diagnosis not present

## 2017-12-06 DIAGNOSIS — H2512 Age-related nuclear cataract, left eye: Secondary | ICD-10-CM | POA: Diagnosis not present

## 2017-12-06 DIAGNOSIS — H04123 Dry eye syndrome of bilateral lacrimal glands: Secondary | ICD-10-CM | POA: Diagnosis not present

## 2017-12-06 DIAGNOSIS — H33051 Total retinal detachment, right eye: Secondary | ICD-10-CM | POA: Diagnosis not present

## 2017-12-06 DIAGNOSIS — Z961 Presence of intraocular lens: Secondary | ICD-10-CM | POA: Diagnosis not present

## 2017-12-06 DIAGNOSIS — H40013 Open angle with borderline findings, low risk, bilateral: Secondary | ICD-10-CM | POA: Diagnosis not present

## 2017-12-06 DIAGNOSIS — H02834 Dermatochalasis of left upper eyelid: Secondary | ICD-10-CM | POA: Diagnosis not present

## 2017-12-06 DIAGNOSIS — H01021 Squamous blepharitis right upper eyelid: Secondary | ICD-10-CM | POA: Diagnosis not present

## 2017-12-06 DIAGNOSIS — H02831 Dermatochalasis of right upper eyelid: Secondary | ICD-10-CM | POA: Diagnosis not present

## 2017-12-06 DIAGNOSIS — H01024 Squamous blepharitis left upper eyelid: Secondary | ICD-10-CM | POA: Diagnosis not present

## 2017-12-06 DIAGNOSIS — H10413 Chronic giant papillary conjunctivitis, bilateral: Secondary | ICD-10-CM | POA: Diagnosis not present

## 2017-12-13 ENCOUNTER — Ambulatory Visit (INDEPENDENT_AMBULATORY_CARE_PROVIDER_SITE_OTHER)
Admission: RE | Admit: 2017-12-13 | Discharge: 2017-12-13 | Disposition: A | Discharge status: Home / Self Care | Payer: PPO | Source: Ambulatory Visit | Attending: Acute Care | Admitting: Acute Care

## 2017-12-13 DIAGNOSIS — Z87891 Personal history of nicotine dependence: Secondary | ICD-10-CM

## 2017-12-13 DIAGNOSIS — F17210 Nicotine dependence, cigarettes, uncomplicated: Secondary | ICD-10-CM

## 2017-12-13 DIAGNOSIS — Z122 Encounter for screening for malignant neoplasm of respiratory organs: Secondary | ICD-10-CM | POA: Diagnosis not present

## 2017-12-19 ENCOUNTER — Other Ambulatory Visit: Payer: Self-pay | Admitting: Acute Care

## 2017-12-19 DIAGNOSIS — F17210 Nicotine dependence, cigarettes, uncomplicated: Principal | ICD-10-CM

## 2017-12-19 DIAGNOSIS — Z122 Encounter for screening for malignant neoplasm of respiratory organs: Secondary | ICD-10-CM

## 2018-01-22 ENCOUNTER — Encounter: Payer: Self-pay | Admitting: Internal Medicine

## 2018-01-22 ENCOUNTER — Ambulatory Visit (INDEPENDENT_AMBULATORY_CARE_PROVIDER_SITE_OTHER): Payer: PPO | Admitting: Internal Medicine

## 2018-01-22 ENCOUNTER — Telehealth: Payer: Self-pay | Admitting: Internal Medicine

## 2018-01-22 VITALS — BP 120/70 | HR 73 | Temp 98.5°F | Ht 65.5 in | Wt 103.0 lb

## 2018-01-22 DIAGNOSIS — Z0000 Encounter for general adult medical examination without abnormal findings: Secondary | ICD-10-CM

## 2018-01-22 DIAGNOSIS — I2510 Atherosclerotic heart disease of native coronary artery without angina pectoris: Secondary | ICD-10-CM | POA: Diagnosis not present

## 2018-01-22 DIAGNOSIS — I2584 Coronary atherosclerosis due to calcified coronary lesion: Secondary | ICD-10-CM | POA: Diagnosis not present

## 2018-01-22 HISTORY — DX: Atherosclerotic heart disease of native coronary artery without angina pectoris: I25.10

## 2018-01-22 LAB — LIPID PANEL
CHOL/HDL RATIO: 4
Cholesterol: 183 mg/dL (ref 0–200)
HDL: 51.6 mg/dL (ref >39.00)
LDL CALC: 104 mg/dL — AB (ref 0–99)
NONHDL: 131.18
Triglycerides: 136 mg/dL (ref 0.0–149.0)
VLDL: 27.2 mg/dL (ref 0.0–40.0)

## 2018-01-22 MED ORDER — ATORVASTATIN CALCIUM 10 MG PO TABS: 10.0000 mg | ORAL_TABLET | Freq: Every day | ORAL | 3 refills | Status: DC

## 2018-01-22 MED ORDER — DIPHENOXYLATE-ATROPINE 2.5-0.025 MG PO TABS: 1.0000 | ORAL_TABLET | Freq: Four times a day (QID) | ORAL | 0 refills | Status: DC | PRN

## 2018-01-22 NOTE — Progress Notes (Signed)
Subjective:    Patient ID: Lindsey Rowland, female    DOB: Mar 21, 1951, 67 y.o.   MRN: [id number]  HPI  67 year old patient who is seen today for a preventive health examination and subsequent Medicare wellness visit She has a history of COPD and ongoing tobacco use.  She has decreased smoking consumption to about 5 cigarettes/day last month she had a follow-up chest CT screening that suggested some nodules involve the right lower lung field.  She is scheduled for follow-up in 2 months. Coronary artery calcification also noted she is feeling well today but Had a very difficult December with an exacerbation of COPD. Colonoscopy 2016  Family history is positive for AAA with her father and she is asking about  screening  Medical records reviewed. She does have a history of osteoarthritis and has been on Boniva for osteoporosis. She has remote history of ulcer disease and uses Protonix. She has hypertension controlled with Zestril.  She receives annual gynecologic care in [facility] Her initial colonoscopy was in 2003 by Dr. Lajoyce Corners in follow-up in January 2016  She is followed by [facility] orthopedics for low back pain  She has been on hydrocodone and Valium and has received regular refills. Her past. Two Urine drug screen have been negative for metabolites  Social history recently retired.  Continues via low-volume tobacco user  Past Medical History:  Diagnosis Date  . Arthritis   . COPD (chronic obstructive pulmonary disease) ([place])   . Headache(784.0)   . Osteopenia   . Polio   . Ulcer      Social History   Socioeconomic History  . Marital status: Married    Spouse name: Not on file  . Number of children: Not on file  . Years of education: Not on file  . Highest education level: Not on file  Social Needs  . Financial resource strain: Not on file  . Food insecurity - worry: Not on file  . Food insecurity - inability: Not on file  . Transportation needs - medical: Not  on file  . Transportation needs - non-medical: Not on file  Occupational History  . Not on file  Tobacco Use  . Smoking status: Current Every Day Smoker    Packs/day: 0.75    Years: 40.00    Pack years: 30.00    Types: Cigarettes  . Smokeless tobacco: Never Used  . Tobacco comment: pt uses electronic cigarettes   Substance and Sexual Activity  . Alcohol use: Yes    Alcohol/week: 0.0 oz    Comment: 2 beers per week  . Drug use: No  . Sexual activity: No  Other Topics Concern  . Not on file  Social History Narrative  . Not on file    Past Surgical History:  Procedure Laterality Date  . APPENDECTOMY    . CATARACT EXTRACTION     right  . DILATION AND CURETTAGE OF UTERUS    . RETINAL DETACHMENT SURGERY  12/2010   right eye  . TONSILLECTOMY      Family History  Problem Relation Age of Onset  . Hiatal hernia Father   . Colon cancer Neg Hx   . Esophageal cancer Neg Hx   . Stomach cancer Neg Hx   . Rectal cancer Neg Hx     Allergies  Allergen Reactions  . Amoxicillin Diarrhea    Current Outpatient Medications on File Prior to Visit  Medication Sig Dispense Refill  . albuterol (PROAIR HFA) 108 (90 Base) MCG/ACT  inhaler Inhale 2 puffs into the lungs every 6 (six) hours as needed. For shortness of breath. 1 Inhaler 6  . Aspirin-Salicylamide-Caffeine (BC HEADACHE POWDER PO) Take 1 packet by mouth daily as needed. For pain.    . celecoxib (CELEBREX) 400 MG capsule 400 mg.  0  . cyclobenzaprine (FLEXERIL) 10 MG tablet Take 1 tablet (10 mg total) by mouth 3 (three) times daily as needed for muscle spasms. 90 tablet 1  . diazepam (VALIUM) 10 MG tablet Take 1 tablet (10 mg total) by mouth at bedtime. PRN 30 tablet 2  . Multiple Vitamin (MULITIVITAMIN WITH MINERALS) TABS Take 1 tablet by mouth daily.    . pantoprazole (PROTONIX) 40 MG tablet TAKE 1 TABLET BY MOUTH DAILY. 90 tablet 2  . rOPINIRole (REQUIP) 0.5 MG tablet TAKE 1 TABLET (0.5 MG TOTAL) BY MOUTH AT BEDTIME. 60 tablet  3   No current facility-administered medications on file prior to visit.     BP 120/70 (BP Location: Right Arm, Patient Position: Sitting, Cuff Size: Normal)   Pulse 73   Temp 98.5 F (36.9 C) (Oral)   Ht 5' 5.5" (1.664 m)   Wt 103 lb (46.7 kg)   SpO2 99%   BMI 16.88 kg/m   Subsequent Medicare wellness visit  1. Risk factors, based on past  M,S,F history.  Cardiovascular risk factors include history of mild hypertension and ongoing tobacco use.  She has coronary artery calcifications noted on chest CT scanning  2.  Physical activities: No exercise limitations.  History of COPD  3.  Depression/mood: No history of major depression history of mild anxiety disorder  4.  Hearing: No deficits  5.  ADL's: Independent  6.  Fall risk: Low  7.  Home safety: No problems identified  8.  Height weight, and visual acuity; height and weight stable no change in visual acuity  9.  Counseling: Total smoking cessation encouraged  10. Lab orders based on risk factors: Laboratory update including hepatitis C antibody will be reviewed  11. Referral : Follow-up low-dose chest CT scanning as well as screening for AAA  12. Care plan: Continue efforts at aggressive risk factor modification  13. Cognitive assessment: Alert and oriented with normal affect.  No cognitive dysfunction  14. Screening: Patient provided with a written and personalized 5-10 year screening schedule in the AVS.    15. Provider List Update: Primary care pulmonary radiology    Review of Systems     Objective:   Physical Exam  Constitutional: She is oriented to person, place, and time. She appears well-developed and well-nourished.  Blood pressure low normalThin   HENT:  Head: Normocephalic and atraumatic.  Right Ear: External ear normal.  Left Ear: External ear normal.  Mouth/Throat: Oropharynx is clear and moist.  Eyes: Conjunctivae and EOM are normal.  Neck: Normal range of motion. Neck supple. No JVD  present. No thyromegaly present.  Cardiovascular: Normal rate, regular rhythm, normal heart sounds and intact distal pulses.  No murmur heard. Decreased left posterior tibial pulse and decreased right dorsalis pedis pulse  Pulmonary/Chest: Effort normal and breath sounds normal. She has no wheezes. She has no rales.  Abdominal: Soft. Bowel sounds are normal. She exhibits no distension and no mass. There is no tenderness. There is no rebound and no guarding.  Musculoskeletal: Normal range of motion. She exhibits no edema or tenderness.  Neurological: She is alert and oriented to person, place, and time. She has normal reflexes. No cranial nerve deficit. She  exhibits normal muscle tone. Coordination normal.  Skin: Skin is warm and dry. No rash noted.  Psychiatric: She has a normal mood and affect. Her behavior is normal.          Assessment & Plan:   Preventive health examination.  AAA screen COPD Ongoing tobacco use Rule out pulmonary nodules right lower lung field.  Patient will have follow-up chest CT in 2 months Coronary artery calcification.  Will place on low intensity statin therapy  Follow-up 6 months  [id number],PETER Pilar Plate

## 2018-01-22 NOTE — Patient Instructions (Addendum)
Smoking tobacco is very bad for your health. You should stop smoking immediately.  Follow-up chest CT screening scan in 2 months as scheduled   Health Maintenance for Postmenopausal Women Menopause is a normal process in which your reproductive ability comes to an end. This process happens gradually over a span of months to years, usually between the ages of 70 and 30. Menopause is complete when you have missed 12 consecutive menstrual periods. It is important to talk with your health care provider about some of the most common conditions that affect postmenopausal women, such as heart disease, cancer, and bone loss (osteoporosis). Adopting a healthy lifestyle and getting preventive care can help to promote your health and wellness. Those actions can also lower your chances of developing some of these common conditions. What should I know about menopause? During menopause, you may experience a number of symptoms, such as:  Moderate-to-severe hot flashes.  Night sweats.  Decrease in sex drive.  Mood swings.  Headaches.  Tiredness.  Irritability.  Memory problems.  Insomnia.  Choosing to treat or not to treat menopausal changes is an individual decision that you make with your health care provider. What should I know about hormone replacement therapy and supplements? Hormone therapy products are effective for treating symptoms that are associated with menopause, such as hot flashes and night sweats. Hormone replacement carries certain risks, especially as you become older. If you are thinking about using estrogen or estrogen with progestin treatments, discuss the benefits and risks with your health care provider. What should I know about heart disease and stroke? Heart disease, heart attack, and stroke become more likely as you age. This may be due, in part, to the hormonal changes that your body experiences during menopause. These can affect how your body processes dietary fats,  triglycerides, and cholesterol. Heart attack and stroke are both medical emergencies. There are many things that you can do to help prevent heart disease and stroke:  Have your blood pressure checked at least every 1-2 years. High blood pressure causes heart disease and increases the risk of stroke.  If you are 31-51 years old, ask your health care provider if you should take aspirin to prevent a heart attack or a stroke.  Do not use any tobacco products, including cigarettes, chewing tobacco, or electronic cigarettes. If you need help quitting, ask your health care provider.  It is important to eat a healthy diet and maintain a healthy weight. ? Be sure to include plenty of vegetables, fruits, low-fat dairy products, and lean protein. ? Avoid eating foods that are high in solid fats, added sugars, or salt (sodium).  Get regular exercise. This is one of the most important things that you can do for your health. ? Try to exercise for at least 150 minutes each week. The type of exercise that you do should increase your heart rate and make you sweat. This is known as moderate-intensity exercise. ? Try to do strengthening exercises at least twice each week. Do these in addition to the moderate-intensity exercise.  Know your numbers.Ask your health care provider to check your cholesterol and your blood glucose. Continue to have your blood tested as directed by your health care provider.  What should I know about cancer screening? There are several types of cancer. Take the following steps to reduce your risk and to catch any cancer development as early as possible. Breast Cancer  Practice breast self-awareness. ? This means understanding how your breasts normally appear and feel. ?  It also means doing regular breast self-exams. Let your health care provider know about any changes, no matter how small.  If you are 15 or older, have a clinician do a breast exam (clinical breast exam or CBE)  every year. Depending on your age, family history, and medical history, it may be recommended that you also have a yearly breast X-ray (mammogram).  If you have a family history of breast cancer, talk with your health care provider about genetic screening.  If you are at high risk for breast cancer, talk with your health care provider about having an MRI and a mammogram every year.  Breast cancer (BRCA) gene test is recommended for women who have family members with BRCA-related cancers. Results of the assessment will determine the need for genetic counseling and BRCA1 and for BRCA2 testing. BRCA-related cancers include these types: ? Breast. This occurs in males or females. ? Ovarian. ? Tubal. This may also be called fallopian tube cancer. ? Cancer of the abdominal or pelvic lining (peritoneal cancer). ? Prostate. ? Pancreatic.  Cervical, Uterine, and Ovarian Cancer Your health care provider may recommend that you be screened regularly for cancer of the pelvic organs. These include your ovaries, uterus, and vagina. This screening involves a pelvic exam, which includes checking for microscopic changes to the surface of your cervix (Pap test).  For women ages 21-65, health care providers may recommend a pelvic exam and a Pap test every three years. For women ages 51-65, they may recommend the Pap test and pelvic exam, combined with testing for human papilloma virus (HPV), every five years. Some types of HPV increase your risk of cervical cancer. Testing for HPV may also be done on women of any age who have unclear Pap test results.  Other health care providers may not recommend any screening for nonpregnant women who are considered low risk for pelvic cancer and have no symptoms. Ask your health care provider if a screening pelvic exam is right for you.  If you have had past treatment for cervical cancer or a condition that could lead to cancer, you need Pap tests and screening for cancer for at  least 20 years after your treatment. If Pap tests have been discontinued for you, your risk factors (such as having a new sexual partner) need to be reassessed to determine if you should start having screenings again. Some women have medical problems that increase the chance of getting cervical cancer. In these cases, your health care provider may recommend that you have screening and Pap tests more often.  If you have a family history of uterine cancer or ovarian cancer, talk with your health care provider about genetic screening.  If you have vaginal bleeding after reaching menopause, tell your health care provider.  There are currently no reliable tests available to screen for ovarian cancer.  Lung Cancer Lung cancer screening is recommended for adults 32-58 years old who are at high risk for lung cancer because of a history of smoking. A yearly low-dose CT scan of the lungs is recommended if you:  Currently smoke.  Have a history of at least 30 pack-years of smoking and you currently smoke or have quit within the past 15 years. A pack-year is smoking an average of one pack of cigarettes per day for one year.  Yearly screening should:  Continue until it has been 15 years since you quit.  Stop if you develop a health problem that would prevent you from having lung cancer  treatment.  Colorectal Cancer  This type of cancer can be detected and can often be prevented.  Routine colorectal cancer screening usually begins at age 75 and continues through age 68.  If you have risk factors for colon cancer, your health care provider may recommend that you be screened at an earlier age.  If you have a family history of colorectal cancer, talk with your health care provider about genetic screening.  Your health care provider may also recommend using home test kits to check for hidden blood in your stool.  A small camera at the end of a tube can be used to examine your colon directly  (sigmoidoscopy or colonoscopy). This is done to check for the earliest forms of colorectal cancer.  Direct examination of the colon should be repeated every 5-10 years until age 38. However, if early forms of precancerous polyps or small growths are found or if you have a family history or genetic risk for colorectal cancer, you may need to be screened more often.  Skin Cancer  Check your skin from head to toe regularly.  Monitor any moles. Be sure to tell your health care provider: ? About any new moles or changes in moles, especially if there is a change in a mole's shape or color. ? If you have a mole that is larger than the size of a pencil eraser.  If any of your family members has a history of skin cancer, especially at a young age, talk with your health care provider about genetic screening.  Always use sunscreen. Apply sunscreen liberally and repeatedly throughout the day.  Whenever you are outside, protect yourself by wearing long sleeves, pants, a wide-brimmed hat, and sunglasses.  What should I know about osteoporosis? Osteoporosis is a condition in which bone destruction happens more quickly than new bone creation. After menopause, you may be at an increased risk for osteoporosis. To help prevent osteoporosis or the bone fractures that can happen because of osteoporosis, the following is recommended:  If you are 77-70 years old, get at least 1,000 mg of calcium and at least 600 mg of vitamin D per day.  If you are older than age 55 but younger than age 38, get at least 1,200 mg of calcium and at least 600 mg of vitamin D per day.  If you are older than age 51, get at least 1,200 mg of calcium and at least 800 mg of vitamin D per day.  Smoking and excessive alcohol intake increase the risk of osteoporosis. Eat foods that are rich in calcium and vitamin D, and do weight-bearing exercises several times each week as directed by your health care provider. What should I know about  how menopause affects my mental health? Depression may occur at any age, but it is more common as you become older. Common symptoms of depression include:  Low or sad mood.  Changes in sleep patterns.  Changes in appetite or eating patterns.  Feeling an overall lack of motivation or enjoyment of activities that you previously enjoyed.  Frequent crying spells.  Talk with your health care provider if you think that you are experiencing depression. What should I know about immunizations? It is important that you get and maintain your immunizations. These include:  Tetanus, diphtheria, and pertussis (Tdap) booster vaccine.  Influenza every year before the flu season begins.  Pneumonia vaccine.  Shingles vaccine.  Your health care provider may also recommend other immunizations. This information is not intended to replace  advice given to you by your health care provider. Make sure you discuss any questions you have with your health care provider. Document Released: 01/13/2006 Document Revised: 06/10/2016 Document Reviewed: 08/25/2015 Elsevier Interactive Patient Education  2018 [employer].

## 2018-01-22 NOTE — Telephone Encounter (Signed)
Copied from [place] [phone] [phone]. Topic: Inquiry >> Jan 22, 2018  2:47 PM Neva Seat wrote: Pt received a call about scheduling an ultra sound.  He husband answered and couldn't remember name of place for Pt to call back.  Can someone call her to let her know the name and number of place she was referred to.

## 2018-01-23 LAB — HEPATITIS C ANTIBODY
HEP C AB: NONREACTIVE
SIGNAL TO CUT-OFF: 0.01 (ref <1.00)

## 2018-01-23 NOTE — Telephone Encounter (Signed)
I did not see a order for ultrasound.

## 2018-01-23 NOTE — Addendum Note (Signed)
Addended by: Dorrene German on: 01/23/2018 09:48 AM   Modules accepted: Orders

## 2018-01-24 NOTE — Telephone Encounter (Signed)
Pt notified of location of AAA duplex and verbalized understanding.

## 2018-01-26 ENCOUNTER — Ambulatory Visit (HOSPITAL_COMMUNITY)
Admission: RE | Admit: 2018-01-26 | Discharge: 2018-01-26 | Disposition: A | Discharge status: Home / Self Care | Payer: PPO | Source: Ambulatory Visit | Attending: Internal Medicine | Admitting: Internal Medicine

## 2018-01-26 DIAGNOSIS — Z0000 Encounter for general adult medical examination without abnormal findings: Secondary | ICD-10-CM | POA: Diagnosis not present

## 2018-02-27 DIAGNOSIS — Z1231 Encounter for screening mammogram for malignant neoplasm of breast: Secondary | ICD-10-CM | POA: Diagnosis not present

## 2018-03-14 ENCOUNTER — Ambulatory Visit (INDEPENDENT_AMBULATORY_CARE_PROVIDER_SITE_OTHER)
Admission: RE | Admit: 2018-03-14 | Discharge: 2018-03-14 | Disposition: A | Discharge status: Home / Self Care | Payer: PPO | Source: Ambulatory Visit | Attending: Acute Care | Admitting: Acute Care

## 2018-03-14 ENCOUNTER — Telehealth: Payer: Self-pay | Admitting: Acute Care

## 2018-03-14 DIAGNOSIS — Z122 Encounter for screening for malignant neoplasm of respiratory organs: Secondary | ICD-10-CM

## 2018-03-14 DIAGNOSIS — F17210 Nicotine dependence, cigarettes, uncomplicated: Secondary | ICD-10-CM | POA: Diagnosis not present

## 2018-03-14 DIAGNOSIS — R918 Other nonspecific abnormal finding of lung field: Secondary | ICD-10-CM | POA: Diagnosis not present

## 2018-03-14 NOTE — Telephone Encounter (Signed)
I attempted to call patient with the results of her scan. She was not available per her husband. I will call in the morning.

## 2018-03-15 ENCOUNTER — Telehealth: Payer: Self-pay | Admitting: Acute Care

## 2018-03-15 DIAGNOSIS — R911 Solitary pulmonary nodule: Secondary | ICD-10-CM

## 2018-03-15 NOTE — Telephone Encounter (Signed)
I have called the patient with the results of her PET scan. I explained that her scan was read as a Lung RADS 4 B indicates suspicious findings for which additional diagnostic testing and or tissue sampling is recommended.I have told her we will order a PET scan and PFT's for further evaluation of the areas. She has verbalized understanding of the above and had no further questions at completion of the call. She has my contact information if she needs to contact me for any reason.

## 2018-03-16 NOTE — Telephone Encounter (Signed)
Spoke with pt and advised message below and gave her the time and date of her PET scan to be at [facility]. Nothing further is needed.

## 2018-03-16 NOTE — Telephone Encounter (Signed)
See telephone note 03/15/18. Will close this message.

## 2018-03-20 ENCOUNTER — Ambulatory Visit (INDEPENDENT_AMBULATORY_CARE_PROVIDER_SITE_OTHER): Payer: PPO | Admitting: Internal Medicine

## 2018-03-20 DIAGNOSIS — R911 Solitary pulmonary nodule: Secondary | ICD-10-CM | POA: Diagnosis not present

## 2018-03-20 LAB — PULMONARY FUNCTION TEST
DL/VA % pred: 39 %
DL/VA: 1.88 ml/min/mmHg/L
DLCO UNC % PRED: 39 %
DLCO unc: 9.52 ml/min/mmHg
FEF 25-75 Post: 1.01 L/sec
FEF 25-75 Pre: 0.76 L/sec
FEF2575-%CHANGE-POST: 33 %
FEF2575-%PRED-POST: 50 %
FEF2575-%PRED-PRE: 37 %
FEV1-%Change-Post: 9 %
FEV1-%PRED-POST: 76 %
FEV1-%Pred-Pre: 69 %
FEV1-Post: 1.78 L
FEV1-Pre: 1.63 L
FEV1FVC-%CHANGE-POST: 0 %
FEV1FVC-%Pred-Pre: 74 %
FEV6-%CHANGE-POST: 7 %
FEV6-%Pred-Post: 104 %
FEV6-%Pred-Pre: 96 %
FEV6-Post: 3.08 L
FEV6-Pre: 2.85 L
FEV6FVC-%Change-Post: 0 %
FEV6FVC-%Pred-Post: 101 %
FEV6FVC-%Pred-Pre: 102 %
FVC-%CHANGE-POST: 8 %
FVC-%PRED-POST: 102 %
FVC-%Pred-Pre: 93 %
FVC-Post: 3.15 L
FVC-Pre: 2.89 L
POST FEV1/FVC RATIO: 57 %
PRE FEV1/FVC RATIO: 56 %
Post FEV6/FVC ratio: 98 %
Pre FEV6/FVC Ratio: 99 %
RV % pred: 180 %
RV: 3.85 L
TLC % pred: 141 %
TLC: 7.14 L

## 2018-03-20 NOTE — Progress Notes (Signed)
PFT completed today.  

## 2018-03-22 ENCOUNTER — Encounter (HOSPITAL_COMMUNITY)
Admission: RE | Admit: 2018-03-22 | Discharge: 2018-03-22 | Disposition: A | Discharge status: Home / Self Care | Payer: PPO | Source: Ambulatory Visit | Attending: Acute Care | Admitting: Acute Care

## 2018-03-22 DIAGNOSIS — J439 Emphysema, unspecified: Secondary | ICD-10-CM | POA: Diagnosis not present

## 2018-03-22 DIAGNOSIS — R911 Solitary pulmonary nodule: Secondary | ICD-10-CM | POA: Diagnosis not present

## 2018-03-22 LAB — GLUCOSE, CAPILLARY: GLUCOSE-CAPILLARY: 83 mg/dL (ref 65–99)

## 2018-03-22 MED ORDER — FLUDEOXYGLUCOSE F - 18 (FDG) INJECTION
5.1000 | Freq: Once | INTRAVENOUS | Status: AC | PRN
  Administered 2018-03-22: 5.1 via INTRAVENOUS

## 2018-03-26 ENCOUNTER — Telehealth: Payer: Self-pay | Admitting: Acute Care

## 2018-03-26 NOTE — Telephone Encounter (Signed)
Sarah, Can you call this pt with PET and PT results?  They are in epic.

## 2018-03-26 NOTE — Telephone Encounter (Addendum)
These PET scan results have been called to the patient.  I explained that her PET scan indicated 2 adjacent hypermetabolic pulmonary nodules in the posterior right lower lobe that are highly suspicious for bronchogenic cancer.  There is no evidence of metastatic disease.  Additionally I discussed her pulmonary function test.  I want her to understand the correlation between pulmonary function tests in evaluating patients for possible surgery.  I explained that we will present her at the thoracic conference on Thursday, 03/29/2018 where both thoracic surgery, oncology, and radiation oncology can evaluate her scan and determine the best plan of care moving forward.  She verbalized understanding, and understands that I will call her on Thursday morning 03/29/2018 with the outcome of that conference.  She had no further questions at completion of the call.  She has my contact information in the event she has any further questions prior to my call on Thursday morning. Denise please fax results to PCP.  Let them know that the patient will be scheduled for thoracic conference 03/29/2018 and that we will let them know the outcome regarding plan of care.  Thank you

## 2018-03-28 ENCOUNTER — Telehealth: Payer: Self-pay | Admitting: Acute Care

## 2018-03-28 DIAGNOSIS — R911 Solitary pulmonary nodule: Secondary | ICD-10-CM

## 2018-03-28 DIAGNOSIS — J441 Chronic obstructive pulmonary disease with (acute) exacerbation: Secondary | ICD-10-CM | POA: Diagnosis not present

## 2018-03-28 NOTE — Telephone Encounter (Signed)
Please order a cardiopulmonary stress test on this patient. Call her and let her know this is a diagnostic test that will help [place] determine how well she will tolerate surgery. Thanks so much

## 2018-03-28 NOTE — Telephone Encounter (Signed)
I called to let Lindsey Rowland know that the thoracic conference has been cancelled for tomorrow. I told her that I have messaged the thoracic surgeon and that I will let her know plan of care , or additional diagnostics needed once he has had time to review her scans. She verbalized understanding of the above and had no further questions at completion of the call.

## 2018-03-29 NOTE — Telephone Encounter (Signed)
LMOMTCB x 1 

## 2018-03-29 NOTE — Telephone Encounter (Signed)
Patient returned call Discussed SG's recommendations for a CPST Pt stated she is willing to proceed with the test but would like Sarah to know that she is unsure how well she will do because she has Polio at age 67 that affected her gait and ability to possibly use the bike for the CPST  Again, pt is willing to move forward with testing and authorized me to go ahead and order the CPST Routing to Judson Roch to make her aware of the patient's concern  Pt authorized detailed message to be left if she does not answer CPST ordered

## 2018-03-30 NOTE — Telephone Encounter (Signed)
Can you call CPST and ask them is this will limit or secu the results of the test? Thanks

## 2018-03-30 NOTE — Telephone Encounter (Addendum)
Patient is scheduled for her CPST at [facility] on 5.7.19 Called cardiopulmonary - they close at 1630 We will call back next week

## 2018-04-02 ENCOUNTER — Telehealth: Payer: Self-pay | Admitting: Emergency Medicine

## 2018-04-02 NOTE — Telephone Encounter (Signed)
Collene Gobble, MD  Magdalen Spatz, NP Cc: Randa Spike, CMA        I reviewed the scans, PFT. I agree that she needs to be seen by me to discuss COPD, options for dx (ENB) versus repeat scan. I will forward this also to LL to work on getting her an OV   ------------------------------------------------------------ ----- Message -----  From: Magdalen Spatz, NP  Sent: 03/28/2018  9:41 PM  To: Collene Gobble, MD  Subject: Do you want to do EBUS and tissue sampling o*   Dr. Lamonte Sakai,  This is a 4B with a PET scan that shows 2 hypermetabolic nodules. She looks good for surgery, but her DLCO is 39%. She is on no maintenence inhalers, and her PFT;s don't show obstruction. I have talked with Dr. Roxan Hockey. He feels she is borderline for surgery, may end up on oxygen. He wants me to order a CPST, and he wanted to know if you wanted to do EBUS/Tissue sampling. There is no MTOC this week so I am doing everything through messaging. Let me know your thoughts. Thanks so much     ------------------------------------------------------------- Attempted to call the pt. I did not receive an answer. I have left a message for pt to return our call.

## 2018-04-02 NOTE — Telephone Encounter (Signed)
Thanks so much. 

## 2018-04-02 NOTE — Telephone Encounter (Signed)
Spoke with pt. She has been scheduled to see Dr. Lamonte Sakai on 04/16/18 at 1:30pm. Nothing further was needed.

## 2018-04-02 NOTE — Telephone Encounter (Signed)
Patient returning Lindsay's call, CB is [phone]

## 2018-04-05 ENCOUNTER — Other Ambulatory Visit: Payer: Self-pay | Admitting: *Deleted

## 2018-04-06 NOTE — Telephone Encounter (Signed)
Lest a message for Erasmo Downer at [facility], to call back to see if she can answer the question pt is concerned about. Will await a call back.

## 2018-04-09 ENCOUNTER — Telehealth (HOSPITAL_COMMUNITY): Payer: Self-pay | Admitting: *Deleted

## 2018-04-09 NOTE — Telephone Encounter (Signed)
Clinical Exercise Physiologist was contacted by ?Jonelle Sidle at [facility] for patient questions about gait and exercise testing (CPX) 04/10/18 at 10am. Contacted patient to try to answer any other questions and left a message with call back for questions prior to her appointment tomorrow. Patient was advised to return call tomorrow if she felt necessary prior to her exercise test at 10:00am.     Landis Martins, MS, ACSM-RCEP Clinical Exercise Physiologist

## 2018-04-10 ENCOUNTER — Encounter (HOSPITAL_COMMUNITY): Payer: PPO | Attending: Internal Medicine

## 2018-04-10 ENCOUNTER — Telehealth: Payer: Self-pay | Admitting: Internal Medicine

## 2018-04-10 DIAGNOSIS — R911 Solitary pulmonary nodule: Secondary | ICD-10-CM | POA: Diagnosis not present

## 2018-04-10 NOTE — Telephone Encounter (Signed)
Pt dropped off a Disability Parking Placard Form to be completed upon completion pt would like for it to be mailed to her at [address], [place], [place] [address].  Form was put in doctors folder for completion.

## 2018-04-12 NOTE — Telephone Encounter (Signed)
Dr.K do you remember filling this out! Please advise

## 2018-04-13 NOTE — Telephone Encounter (Signed)
Please advise 

## 2018-04-16 ENCOUNTER — Ambulatory Visit: Payer: PPO | Admitting: Emergency Medicine

## 2018-04-16 ENCOUNTER — Encounter: Payer: Self-pay | Admitting: Emergency Medicine

## 2018-04-16 DIAGNOSIS — R911 Solitary pulmonary nodule: Secondary | ICD-10-CM

## 2018-04-16 DIAGNOSIS — F17200 Nicotine dependence, unspecified, uncomplicated: Secondary | ICD-10-CM | POA: Diagnosis not present

## 2018-04-16 DIAGNOSIS — C3491 Malignant neoplasm of unspecified part of right bronchus or lung: Secondary | ICD-10-CM | POA: Insufficient documentation

## 2018-04-16 DIAGNOSIS — R918 Other nonspecific abnormal finding of lung field: Secondary | ICD-10-CM

## 2018-04-16 DIAGNOSIS — J449 Chronic obstructive pulmonary disease, unspecified: Secondary | ICD-10-CM | POA: Diagnosis not present

## 2018-04-16 HISTORY — DX: Malignant neoplasm of unspecified part of right bronchus or lung: C34.91

## 2018-04-16 MED ORDER — TIOTROPIUM BROMIDE MONOHYDRATE 2.5 MCG/ACT IN AERS: 2.0000 | INHALATION_SPRAY | Freq: Every day | RESPIRATORY_TRACT | 0 refills | Status: DC

## 2018-04-16 NOTE — Telephone Encounter (Signed)
Yes this form was completed last week

## 2018-04-16 NOTE — Assessment & Plan Note (Signed)
I reviewed her scans with her in detail.  She has 2 high risk right lower lobe nodules that are adjacent to each other, mild hypermetabolism on PET scan and an increasing solid component all suggestive of malignancy.  Based on her cardia pulmonary exercise testing and her pulmonary function testing I do not think she is a good candidate for primary referral for surgical resection.  Therefore I believe either biopsy now or repeat scan in 3 months to see if there is an interval decrease in size are the best to possible strategies.  She wants to repeat scan in 3 months (July).  If the nodules have shrunk then we will continue surveillance.  If they are the same size or larger than I think we should proceed to navigational bronchoscopy and biopsy.  I will do a SuperD CT scan of the chest in July and then we will follow-up to decide next steps.

## 2018-04-16 NOTE — Patient Instructions (Addendum)
We will perform a CT chest in early July to compare with our priors.  Depending on the results we will decide whether to perform a navigational bronchoscopy to sample your pulmonary nodules.  We will try starting Spiriva Respimat 2 puffs once a day. If you benefit then please call and let us know so we can order for you.  Keep albuterol available to use 2 puffs as needed for shortness of breath.  Follow with Dr Lamonte Sakai in July after the scan to review together Complete a release of information form so we can send your notes and scan results for a second opinion.

## 2018-04-16 NOTE — Progress Notes (Signed)
Subjective:    Patient ID: Lindsey Rowland, female    DOB: 29-Apr-1951, 67 y.o.   MRN: [id number]  HPI 67 year old smoker (30 pack years) with a history of COPD.  She underwent a screening CT scan of the chest in January 2019 that is notified to predominant right lower lobe nodules.  A repeat scan in April 2019 showed some increase in the solid component of the largest right lower lobe nodule.  This prompted a PET scan that was done on 03/22/2018 that I have reviewed.  There was evidence for hypermetabolism and both the predominant right lower lobe nodules that were suspicious for malignancy.  There was no evidence for any hypermetabolic mediastinal nodes. She underwent pulmonary function testing on 03/20/2018 that I reviewed.  This shows moderate to moderately severe obstruction without a bronchodilator response, significant hyperinflation and a severely decreased diffusion capacity.  A cardiopulmonary exercise test from 04/10/2018 was reviewed this showed mild functional impairment that was limited due to an elevated VE/VCO2 slope, principally consistent with a pulmonary limitation. She is here today to discuss her abnormal imaging, plan next steps.   She has felt pretty well. She has lost some wt. About 18 lbs over the last year. She is an active person without any dyspnea. Minimal cough, except with allergies, URI etc. She has albuterol available, uses it about 2-3x a month. She is smoking about 4-5 cig a day.      Review of Systems  Past Medical History:  Diagnosis Date  . Arthritis   . COPD (chronic obstructive pulmonary disease) ([place])   . Headache(784.0)   . Osteopenia   . Polio   . Ulcer      Family History  Problem Relation Age of Onset  . Hiatal hernia Father   . Colon cancer Neg Hx   . Esophageal cancer Neg Hx   . Stomach cancer Neg Hx   . Rectal cancer Neg Hx      Social History   Socioeconomic History  . Marital status: Married    Spouse name: Not on file  . Number of  children: Not on file  . Years of education: Not on file  . Highest education level: Not on file  Occupational History  . Not on file  Social Needs  . Financial resource strain: Not on file  . Food insecurity:    Worry: Not on file    Inability: Not on file  . Transportation needs:    Medical: Not on file    Non-medical: Not on file  Tobacco Use  . Smoking status: Current Every Day Smoker    Packs/day: 0.75    Years: 40.00    Pack years: 30.00    Types: Cigarettes  . Smokeless tobacco: Never Used  . Tobacco comment: pt uses electronic cigarettes   Substance and Sexual Activity  . Alcohol use: Yes    Alcohol/week: 0.0 oz    Comment: 2 beers per week  . Drug use: No    Types: Hydrocodone  . Sexual activity: Never  Lifestyle  . Physical activity:    Days per week: Not on file    Minutes per session: Not on file  . Stress: Not on file  Relationships  . Social connections:    Talks on phone: Not on file    Gets together: Not on file    Attends religious service: Not on file    Active member of club or organization: Not on file  Attends meetings of clubs or organizations: Not on file    Relationship status: Not on file  . Intimate partner violence:    Fear of current or ex partner: Not on file    Emotionally abused: Not on file    Physically abused: Not on file    Forced sexual activity: Not on file  Other Topics Concern  . Not on file  Social History Narrative  . 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  No TB exposure.   Allergies  Allergen Reactions  . Amoxicillin Diarrhea     Outpatient Medications Prior to Visit  Medication Sig Dispense Refill  . albuterol (PROAIR HFA) 108 (90 Base) MCG/ACT inhaler Inhale 2 puffs into the lungs every 6 (six) hours as needed. For shortness of breath. 1 Inhaler 6  . Aspirin-Salicylamide-Caffeine (BC HEADACHE POWDER PO) Take 1 packet by mouth daily as needed. For pain.    Marland Kitchen  atorvastatin (LIPITOR) 10 MG tablet Take 1 tablet (10 mg total) by mouth daily. 90 tablet 3  . celecoxib (CELEBREX) 400 MG capsule 400 mg.  0  . cyclobenzaprine (FLEXERIL) 10 MG tablet Take 1 tablet (10 mg total) by mouth 3 (three) times daily as needed for muscle spasms. 90 tablet 1  . diazepam (VALIUM) 10 MG tablet Take 1 tablet (10 mg total) by mouth at bedtime. PRN 30 tablet 2  . diphenoxylate-atropine (LOMOTIL) 2.5-0.025 MG tablet Take 1 tablet by mouth 4 (four) times daily as needed for diarrhea or loose stools. 30 tablet 0  . Multiple Vitamin (MULITIVITAMIN WITH MINERALS) TABS Take 1 tablet by mouth daily.    . pantoprazole (PROTONIX) 40 MG tablet TAKE 1 TABLET BY MOUTH DAILY. 90 tablet 2  . rOPINIRole (REQUIP) 0.5 MG tablet TAKE 1 TABLET (0.5 MG TOTAL) BY MOUTH AT BEDTIME. 60 tablet 3   No facility-administered medications prior to visit.          Objective:   Physical Exam Vitals:   04/16/18 1324  BP: 140/80  Pulse: 84  SpO2: 95%  Weight: 102 lb (46.3 kg)  Height: 5\' 5"  (1.651 m)   Gen: Pleasant, thin, in no distress,  normal affect  ENT: No lesions,  mouth clear,  oropharynx clear, no postnasal drip  Neck: No JVD, no stridor  Lungs: No use of accessory muscles, clear without rales or rhonchi  Cardiovascular: RRR, heart sounds normal, no murmur or gallops, no peripheral edema  Musculoskeletal: No deformities, no cyanosis or clubbing  Neuro: alert, non focal  Skin: Warm, no lesions or rash    CT chest 03/14/18 --  COMPARISON:  Low-dose lung cancer screening chest CT 12/13/2017.  FINDINGS: Cardiovascular: Heart size is normal. There is no significant pericardial fluid, thickening or pericardial calcification. There is aortic atherosclerosis, as well as atherosclerosis of the great vessels of the mediastinum and the coronary arteries, including calcified atherosclerotic plaque in the left main, left anterior descending, left circumflex and right coronary  arteries.  Mediastinum/Nodes: No pathologically enlarged mediastinal or hilar lymph nodes. Please note that accurate exclusion of hilar adenopathy is limited on noncontrast CT scans. Esophagus is unremarkable in appearance. No axillary lymphadenopathy.  Lungs/Pleura: Multiple pulmonary nodules are again noted in the lungs bilaterally, generally similar to the prior examination, including one of the larger nodules in the right lower lobe. However, the largest nodule in the right lower lobe which is highly irregular in shape (axial image 254 of  series 3), demonstrates a volume derived mean diameter of 13.6 mm on today's examination, significantly increased from 9.9 mm on the prior study. Diffuse bronchial wall thickening with moderate centrilobular and paraseptal emphysema. Scarring in the apex of the lungs bilaterally. No acute consolidative airspace disease. No pleural effusions.  Upper Abdomen: Aortic atherosclerosis.  Musculoskeletal: There are no aggressive appearing lytic or blastic lesions noted in the visualized portions of the skeleton.  IMPRESSION: 1. Lung-RADS 4BS, suspicious. Additional imaging evaluation or consultation with Pulmonology or Thoracic Surgery recommended. 2. The "S" modifier above refers to potentially clinically significant non lung cancer related findings. Specifically, there is aortic atherosclerosis, in addition to left main and 3 vessel coronary artery disease. Please note that although the presence of coronary artery calcium documents the presence of coronary artery disease, the severity of this disease and any potential stenosis cannot be assessed on this non-gated CT examination. Assessment for potential risk factor modification, dietary therapy or pharmacologic therapy may be warranted, if clinically indicated. 3. Mild diffuse bronchial thickening with moderate centrilobular and paraseptal emphysema; imaging findings suggestive of  underlying COPD.    PET scan 03/22/18 --  COMPARISON:  Chest CT on 03/14/2018  FINDINGS: Mediastinal blood pool activity: SUV max = 1.6  NECK:  No hypermetabolic lymph nodes or masses.  Incidental CT findings:  None.  CHEST: No hypermetabolic lymphadenopathy. Two adjacent pulmonary nodules are seen in the posterior right lower lobe. 11 mm nodule on image 58/8 is mostly solid, and adjacent sub-solid nodule measures 19 mm on image 65/8. These are hypermetabolic, with SUV max of 2.4. No other suspicious pulmonary nodules identified. No evidence of pleural effusion.  Incidental CT findings: Moderate centrilobular emphysema.  ABDOMEN/PELVIS: No abnormal hypermetabolic activity within the liver, pancreas, adrenal glands, or spleen. No hypermetabolic lymph nodes in the abdomen or pelvis.  Incidental CT findings:  Aortic atherosclerosis.  SKELETON: No focal hypermetabolic bone lesions to suggest skeletal metastasis.  Incidental CT findings:  None.  IMPRESSION: Two adjacent hypermetabolic pulmonary nodules in posterior right lower lobe, highly suspicious for bronchogenic carcinoma.  No evidence of local or distant metastatic disease.  Moderate emphysema.      Assessment & Plan:  CIGARETTE SMOKER Needs smoking cessation.  I spoke to her about this briefly but we will need to revisit in order to create a strategy, determine whether she is truly motivated to stop.  COPD, mild Based on her pulmonary function testing done 03/20/2018.  Trial of Spiriva Respimat to see if she benefits.  Review next time and continue if her breathing is better on the medication.  Pulmonary nodules/lesions, multiple I reviewed her scans with her in detail.  She has 2 high risk right lower lobe nodules that are adjacent to each other, mild hypermetabolism on PET scan and an increasing solid component all suggestive of malignancy.  Based on her cardia pulmonary exercise testing and her  pulmonary function testing I do not think she is a good candidate for primary referral for surgical resection.  Therefore I believe either biopsy now or repeat scan in 3 months to see if there is an interval decrease in size are the best to possible strategies.  She wants to repeat scan in 3 months (July).  If the nodules have shrunk then we will continue surveillance.  If they are the same size or larger than I think we should proceed to navigational bronchoscopy and biopsy.  I will do a SuperD CT scan of the chest in July and then  we will follow-up to decide next steps.  Baltazar Apo, MD, PhD 04/16/2018, 5:17 PM [facility] [phone] or if no answer [phone]

## 2018-04-16 NOTE — Assessment & Plan Note (Signed)
Based on her pulmonary function testing done 03/20/2018.  Trial of Spiriva Respimat to see if she benefits.  Review next time and continue if her breathing is better on the medication.

## 2018-04-16 NOTE — Assessment & Plan Note (Signed)
Needs smoking cessation.  I spoke to her about this briefly but we will need to revisit in order to create a strategy, determine whether she is truly motivated to stop.

## 2018-04-17 NOTE — Telephone Encounter (Signed)
Left message to return call. Dr.Kwiatkowski filled it out last week.

## 2018-05-03 NOTE — Telephone Encounter (Signed)
CPST already and resulted

## 2018-06-13 ENCOUNTER — Telehealth: Payer: Self-pay | Admitting: Emergency Medicine

## 2018-06-13 NOTE — Telephone Encounter (Signed)
lmtcb x1 for pt. 

## 2018-06-14 ENCOUNTER — Ambulatory Visit (INDEPENDENT_AMBULATORY_CARE_PROVIDER_SITE_OTHER)
Admission: RE | Admit: 2018-06-14 | Discharge: 2018-06-14 | Disposition: A | Discharge status: Home / Self Care | Payer: PPO | Source: Ambulatory Visit | Attending: Emergency Medicine | Admitting: Emergency Medicine

## 2018-06-14 DIAGNOSIS — R918 Other nonspecific abnormal finding of lung field: Secondary | ICD-10-CM | POA: Diagnosis not present

## 2018-06-14 DIAGNOSIS — R911 Solitary pulmonary nodule: Secondary | ICD-10-CM

## 2018-06-20 ENCOUNTER — Telehealth: Payer: Self-pay | Admitting: Emergency Medicine

## 2018-06-20 MED ORDER — TIOTROPIUM BROMIDE MONOHYDRATE 2.5 MCG/ACT IN AERS: 2.0000 | INHALATION_SPRAY | Freq: Every day | RESPIRATORY_TRACT | 1 refills | Status: DC

## 2018-06-20 NOTE — Telephone Encounter (Signed)
Spoke with pt. She is aware of her results. Pt would like to come in a speak with RB. I was attempting to schedule her appointment when our call got disconnected. I tried to call her back and the call went straight to voicemail. I have left her a message to return our call.

## 2018-06-20 NOTE — Telephone Encounter (Signed)
I reviewed the CT chest. The suspicious pulmonary nodules have not changed compared with her most recent scan in April.  We are in a position where we could either continue to follow them for interval change on CT scan, or we could go ahead and pursue biopsy, probably by bronchoscopy. If she wants to review with me, decide together, then please set her up for an OV.

## 2018-06-20 NOTE — Telephone Encounter (Signed)
Spoke with pt. She is requesting her CT results and a prescription for Spiriva Respimat. Rx has been sent in.  RB - please advise on CT results.

## 2018-06-21 NOTE — Telephone Encounter (Signed)
Pt is returning call. Cb is [phone].

## 2018-06-21 NOTE — Telephone Encounter (Signed)
Spoke with pt. She has been scheduled to see RB on 07/18/18 at 1:45pm. Nothing further was needed.

## 2018-07-04 ENCOUNTER — Other Ambulatory Visit: Payer: Self-pay | Admitting: Internal Medicine

## 2018-07-18 ENCOUNTER — Encounter: Payer: Self-pay | Admitting: Emergency Medicine

## 2018-07-18 ENCOUNTER — Ambulatory Visit: Payer: PPO | Admitting: Emergency Medicine

## 2018-07-18 DIAGNOSIS — R918 Other nonspecific abnormal finding of lung field: Secondary | ICD-10-CM | POA: Diagnosis not present

## 2018-07-18 DIAGNOSIS — J449 Chronic obstructive pulmonary disease, unspecified: Secondary | ICD-10-CM | POA: Diagnosis not present

## 2018-07-18 NOTE — Assessment & Plan Note (Signed)
Please continue Spiriva once daily as you have been taking it. Keep albuterol available to use 2 puffs if needed for shortness of breath, wheezing, chest tightness.

## 2018-07-18 NOTE — Assessment & Plan Note (Signed)
Your CT scan of the chest shows that your right lower lobe pulmonary nodules are still present.  They have increased in size and character slightly over serial films.  Dr. Lamonte Sakai will discuss your case with thoracic surgery so that we can plan appropriate biopsy either by bronchoscopy or a surgical approach. Follow with Dr Lamonte Sakai in 1 month

## 2018-07-18 NOTE — Patient Instructions (Signed)
Please continue Spiriva once daily as you have been taking it. Keep albuterol available to use 2 puffs if needed for shortness of breath, wheezing, chest tightness. Your CT scan of the chest shows that your right lower lobe pulmonary nodules are still present.  They have increased in size and character slightly over serial films.  Dr. Lamonte Sakai will discuss your case with thoracic surgery so that we can plan appropriate biopsy either by bronchoscopy or a surgical approach. Follow with Dr Lamonte Sakai in 1 month

## 2018-07-18 NOTE — Progress Notes (Signed)
Subjective:    Patient ID: Lindsey Rowland, female    DOB: 09/04/1951, 67 y.o.   MRN: [id number]  HPI 67 year old smoker (30 pack years) with a history of COPD.  She underwent a screening CT scan of the chest in January 2019 that is notified to predominant right lower lobe nodules.  A repeat scan in April 2019 showed some increase in the solid component of the largest right lower lobe nodule.  This prompted a PET scan that was done on 03/22/2018 that I have reviewed.  There was evidence for hypermetabolism and both the predominant right lower lobe nodules that were suspicious for malignancy.  There was no evidence for any hypermetabolic mediastinal nodes. She underwent pulmonary function testing on 03/20/2018 that I reviewed.  This shows moderate to moderately severe obstruction without a bronchodilator response, significant hyperinflation and a severely decreased diffusion capacity.  A cardiopulmonary exercise test from 04/10/2018 was reviewed this showed mild functional impairment that was limited due to an elevated VE/VCO2 slope, principally consistent with a pulmonary limitation. She is here today to discuss her abnormal imaging, plan next steps.   She has felt pretty well. She has lost some wt. About 18 lbs over the last year. She is an active person without any dyspnea. Minimal cough, except with allergies, URI etc. She has albuterol available, uses it about 2-3x a month. She is smoking about 4-5 cig a day.   ROV 07/17/18 --follow-up visit for patient with history of tobacco use, COPD and right lower lobe pulmonary nodules with some hypermetabolism noted on PET scan 03/22/2018.  We decided to repeat her scan after 4-month interval to see if there was any increase in size or change in composition.  Scan was done on 06/14/2018 that I reviewed.  There has been no significant change in the cluster of right lower lobe nodules.  Remains suspicious for malignancy.  She reports that she spiriva is helping her  some - still gets sob out it the heat. She is smoking about 10 cig a week. She uses albuterol rarely. No flares since last time.      Review of Systems  Past Medical History:  Diagnosis Date  . Arthritis   . COPD (chronic obstructive pulmonary disease) ([place])   . Headache(784.0)   . Osteopenia   . Polio   . Ulcer      Family History  Problem Relation Age of Onset  . Hiatal hernia Father   . Colon cancer Neg Hx   . Esophageal cancer Neg Hx   . Stomach cancer Neg Hx   . Rectal cancer Neg Hx      Social History   Socioeconomic History  . Marital status: Married    Spouse name: Not on file  . Number of children: Not on file  . Years of education: Not on file  . Highest education level: Not on file  Occupational History  . Not on file  Social Needs  . Financial resource strain: Not on file  . Food insecurity:    Worry: Not on file    Inability: Not on file  . Transportation needs:    Medical: Not on file    Non-medical: Not on file  Tobacco Use  . Smoking status: Current Every Day Smoker    Packs/day: 0.75    Years: 40.00    Pack years: 30.00    Types: Cigarettes  . Smokeless tobacco: Never Used  . Tobacco comment: pt uses electronic cigarettes  Substance and Sexual Activity  . Alcohol use: Yes    Alcohol/week: 0.0 standard drinks    Comment: 2 beers per week  . Drug use: No    Types: Hydrocodone  . Sexual activity: Never  Lifestyle  . Physical activity:    Days per week: Not on file    Minutes per session: Not on file  . Stress: Not on file  Relationships  . Social connections:    Talks on phone: Not on file    Gets together: Not on file    Attends religious service: Not on file    Active member of club or organization: Not on file    Attends meetings of clubs or organizations: Not on file    Relationship status: Not on file  . Intimate partner violence:    Fear of current or ex partner: Not on file    Emotionally abused: Not on file     Physically abused: Not on file    Forced sexual activity: Not on file  Other Topics Concern  . Not on file  Social History Narrative  . 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  No TB exposure.   Allergies  Allergen Reactions  . Amoxicillin Diarrhea     Outpatient Medications Prior to Visit  Medication Sig Dispense Refill  . albuterol (PROAIR HFA) 108 (90 Base) MCG/ACT inhaler Inhale 2 puffs into the lungs every 6 (six) hours as needed. For shortness of breath. 1 Inhaler 6  . Aspirin-Salicylamide-Caffeine (BC HEADACHE POWDER PO) Take 1 packet by mouth daily as needed. For pain.    Marland Kitchen atorvastatin (LIPITOR) 10 MG tablet Take 1 tablet (10 mg total) by mouth daily. 90 tablet 3  . celecoxib (CELEBREX) 400 MG capsule 400 mg.  0  . cyclobenzaprine (FLEXERIL) 10 MG tablet Take 1 tablet (10 mg total) by mouth 3 (three) times daily as needed for muscle spasms. 90 tablet 1  . diazepam (VALIUM) 10 MG tablet Take 1 tablet (10 mg total) by mouth at bedtime. PRN 30 tablet 2  . diphenoxylate-atropine (LOMOTIL) 2.5-0.025 MG tablet Take 1 tablet by mouth 4 (four) times daily as needed for diarrhea or loose stools. 30 tablet 0  . Multiple Vitamin (MULITIVITAMIN WITH MINERALS) TABS Take 1 tablet by mouth daily.    . pantoprazole (PROTONIX) 40 MG tablet TAKE 1 TABLET BY MOUTH EVERY DAY 90 tablet 2  . rOPINIRole (REQUIP) 0.5 MG tablet TAKE 1 TABLET (0.5 MG TOTAL) BY MOUTH AT BEDTIME. 60 tablet 3  . Tiotropium Bromide Monohydrate (SPIRIVA RESPIMAT) 2.5 MCG/ACT AERS Inhale 2 puffs into the lungs daily. 3 Inhaler 1   No facility-administered medications prior to visit.          Objective:   Physical Exam Vitals:   07/18/18 1335  BP: 108/64  Pulse: (!) 105  SpO2: 97%  Weight: 101 lb (45.8 kg)  Height: 5' 4.25" (1.632 m)   Gen: Pleasant, thin, in no distress,  normal affect  ENT: No lesions,  mouth clear,  oropharynx clear, no postnasal  drip  Neck: No JVD, no stridor  Lungs: No use of accessory muscles, clear without rales or rhonchi  Cardiovascular: RRR, heart sounds normal, no murmur or gallops, no peripheral edema  Musculoskeletal: No deformities, no cyanosis or clubbing  Neuro: alert, non focal  Skin: Warm, no lesions or rash    CT chest 03/14/18 --  COMPARISON:  Low-dose lung  cancer screening chest CT 12/13/2017.  FINDINGS: Cardiovascular: Heart size is normal. There is no significant pericardial fluid, thickening or pericardial calcification. There is aortic atherosclerosis, as well as atherosclerosis of the great vessels of the mediastinum and the coronary arteries, including calcified atherosclerotic plaque in the left main, left anterior descending, left circumflex and right coronary arteries.  Mediastinum/Nodes: No pathologically enlarged mediastinal or hilar lymph nodes. Please note that accurate exclusion of hilar adenopathy is limited on noncontrast CT scans. Esophagus is unremarkable in appearance. No axillary lymphadenopathy.  Lungs/Pleura: Multiple pulmonary nodules are again noted in the lungs bilaterally, generally similar to the prior examination, including one of the larger nodules in the right lower lobe. However, the largest nodule in the right lower lobe which is highly irregular in shape (axial image 254 of series 3), demonstrates a volume derived mean diameter of 13.6 mm on today's examination, significantly increased from 9.9 mm on the prior study. Diffuse bronchial wall thickening with moderate centrilobular and paraseptal emphysema. Scarring in the apex of the lungs bilaterally. No acute consolidative airspace disease. No pleural effusions.  Upper Abdomen: Aortic atherosclerosis.  Musculoskeletal: There are no aggressive appearing lytic or blastic lesions noted in the visualized portions of the skeleton.  IMPRESSION: 1. Lung-RADS 4BS, suspicious. Additional imaging  evaluation or consultation with Pulmonology or Thoracic Surgery recommended. 2. The "S" modifier above refers to potentially clinically significant non lung cancer related findings. Specifically, there is aortic atherosclerosis, in addition to left main and 3 vessel coronary artery disease. Please note that although the presence of coronary artery calcium documents the presence of coronary artery disease, the severity of this disease and any potential stenosis cannot be assessed on this non-gated CT examination. Assessment for potential risk factor modification, dietary therapy or pharmacologic therapy may be warranted, if clinically indicated. 3. Mild diffuse bronchial thickening with moderate centrilobular and paraseptal emphysema; imaging findings suggestive of underlying COPD.    PET scan 03/22/18 --  COMPARISON:  Chest CT on 03/14/2018  FINDINGS: Mediastinal blood pool activity: SUV max = 1.6  NECK:  No hypermetabolic lymph nodes or masses.  Incidental CT findings:  None.  CHEST: No hypermetabolic lymphadenopathy. Two adjacent pulmonary nodules are seen in the posterior right lower lobe. 11 mm nodule on image 58/8 is mostly solid, and adjacent sub-solid nodule measures 19 mm on image 65/8. These are hypermetabolic, with SUV max of 2.4. No other suspicious pulmonary nodules identified. No evidence of pleural effusion.  Incidental CT findings: Moderate centrilobular emphysema.  ABDOMEN/PELVIS: No abnormal hypermetabolic activity within the liver, pancreas, adrenal glands, or spleen. No hypermetabolic lymph nodes in the abdomen or pelvis.  Incidental CT findings:  Aortic atherosclerosis.  SKELETON: No focal hypermetabolic bone lesions to suggest skeletal metastasis.  Incidental CT findings:  None.  IMPRESSION: Two adjacent hypermetabolic pulmonary nodules in posterior right lower lobe, highly suspicious for bronchogenic carcinoma.  No evidence of local  or distant metastatic disease.  Moderate emphysema.      Assessment & Plan:  COPD, mild Please continue Spiriva once daily as you have been taking it. Keep albuterol available to use 2 puffs if needed for shortness of breath, wheezing, chest tightness.  Pulmonary nodules/lesions, multiple Your CT scan of the chest shows that your right lower lobe pulmonary nodules are still present.  They have increased in size and character slightly over serial films.  Dr. Lamonte Sakai will discuss your case with thoracic surgery so that we can plan appropriate biopsy either by bronchoscopy or a surgical  approach. Follow with Dr Lamonte Sakai in 1 month  Baltazar Apo, MD, PhD 07/18/2018, 1:52 PM [facility] [phone] or if no answer [phone]

## 2018-07-18 NOTE — H&P (View-Only) (Signed)
Subjective:    Patient ID: Lindsey Rowland, female    DOB: 1951-03-04, 67 y.o.   MRN: [id number]  HPI 67 year old smoker (30 pack years) with a history of COPD.  She underwent a screening CT scan of the chest in January 2019 that is notified to predominant right lower lobe nodules.  A repeat scan in April 2019 showed some increase in the solid component of the largest right lower lobe nodule.  This prompted a PET scan that was done on 03/22/2018 that I have reviewed.  There was evidence for hypermetabolism and both the predominant right lower lobe nodules that were suspicious for malignancy.  There was no evidence for any hypermetabolic mediastinal nodes. She underwent pulmonary function testing on 03/20/2018 that I reviewed.  This shows moderate to moderately severe obstruction without a bronchodilator response, significant hyperinflation and a severely decreased diffusion capacity.  A cardiopulmonary exercise test from 04/10/2018 was reviewed this showed mild functional impairment that was limited due to an elevated VE/VCO2 slope, principally consistent with a pulmonary limitation. She is here today to discuss her abnormal imaging, plan next steps.   She has felt pretty well. She has lost some wt. About 18 lbs over the last year. She is an active person without any dyspnea. Minimal cough, except with allergies, URI etc. She has albuterol available, uses it about 2-3x a month. She is smoking about 4-5 cig a day.   ROV 07/17/18 --follow-up visit for patient with history of tobacco use, COPD and right lower lobe pulmonary nodules with some hypermetabolism noted on PET scan 03/22/2018.  We decided to repeat her scan after 71-month interval to see if there was any increase in size or change in composition.  Scan was done on 06/14/2018 that I reviewed.  There has been no significant change in the cluster of right lower lobe nodules.  Remains suspicious for malignancy.  She reports that she spiriva is helping her  some - still gets sob out it the heat. She is smoking about 10 cig a week. She uses albuterol rarely. No flares since last time.      Review of Systems  Past Medical History:  Diagnosis Date  . Arthritis   . COPD (chronic obstructive pulmonary disease) ([place])   . Headache(784.0)   . Osteopenia   . Polio   . Ulcer      Family History  Problem Relation Age of Onset  . Hiatal hernia Father   . Colon cancer Neg Hx   . Esophageal cancer Neg Hx   . Stomach cancer Neg Hx   . Rectal cancer Neg Hx      Social History   Socioeconomic History  . Marital status: Married    Spouse name: Not on file  . Number of children: Not on file  . Years of education: Not on file  . Highest education level: Not on file  Occupational History  . Not on file  Social Needs  . Financial resource strain: Not on file  . Food insecurity:    Worry: Not on file    Inability: Not on file  . Transportation needs:    Medical: Not on file    Non-medical: Not on file  Tobacco Use  . Smoking status: Current Every Day Smoker    Packs/day: 0.75    Years: 40.00    Pack years: 30.00    Types: Cigarettes  . Smokeless tobacco: Never Used  . Tobacco comment: pt uses electronic cigarettes  Substance and Sexual Activity  . Alcohol use: Yes    Alcohol/week: 0.0 standard drinks    Comment: 2 beers per week  . Drug use: No    Types: Hydrocodone  . Sexual activity: Never  Lifestyle  . Physical activity:    Days per week: Not on file    Minutes per session: Not on file  . Stress: Not on file  Relationships  . Social connections:    Talks on phone: Not on file    Gets together: Not on file    Attends religious service: Not on file    Active member of club or organization: Not on file    Attends meetings of clubs or organizations: Not on file    Relationship status: Not on file  . Intimate partner violence:    Fear of current or ex partner: Not on file    Emotionally abused: Not on file     Physically abused: Not on file    Forced sexual activity: Not on file  Other Topics Concern  . Not on file  Social History Narrative  . 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  No TB exposure.   Allergies  Allergen Reactions  . Amoxicillin Diarrhea     Outpatient Medications Prior to Visit  Medication Sig Dispense Refill  . albuterol (PROAIR HFA) 108 (90 Base) MCG/ACT inhaler Inhale 2 puffs into the lungs every 6 (six) hours as needed. For shortness of breath. 1 Inhaler 6  . Aspirin-Salicylamide-Caffeine (BC HEADACHE POWDER PO) Take 1 packet by mouth daily as needed. For pain.    Marland Kitchen atorvastatin (LIPITOR) 10 MG tablet Take 1 tablet (10 mg total) by mouth daily. 90 tablet 3  . celecoxib (CELEBREX) 400 MG capsule 400 mg.  0  . cyclobenzaprine (FLEXERIL) 10 MG tablet Take 1 tablet (10 mg total) by mouth 3 (three) times daily as needed for muscle spasms. 90 tablet 1  . diazepam (VALIUM) 10 MG tablet Take 1 tablet (10 mg total) by mouth at bedtime. PRN 30 tablet 2  . diphenoxylate-atropine (LOMOTIL) 2.5-0.025 MG tablet Take 1 tablet by mouth 4 (four) times daily as needed for diarrhea or loose stools. 30 tablet 0  . Multiple Vitamin (MULITIVITAMIN WITH MINERALS) TABS Take 1 tablet by mouth daily.    . pantoprazole (PROTONIX) 40 MG tablet TAKE 1 TABLET BY MOUTH EVERY DAY 90 tablet 2  . rOPINIRole (REQUIP) 0.5 MG tablet TAKE 1 TABLET (0.5 MG TOTAL) BY MOUTH AT BEDTIME. 60 tablet 3  . Tiotropium Bromide Monohydrate (SPIRIVA RESPIMAT) 2.5 MCG/ACT AERS Inhale 2 puffs into the lungs daily. 3 Inhaler 1   No facility-administered medications prior to visit.          Objective:   Physical Exam Vitals:   07/18/18 1335  BP: 108/64  Pulse: (!) 105  SpO2: 97%  Weight: 101 lb (45.8 kg)  Height: 5' 4.25" (1.632 m)   Gen: Pleasant, thin, in no distress,  normal affect  ENT: No lesions,  mouth clear,  oropharynx clear, no postnasal  drip  Neck: No JVD, no stridor  Lungs: No use of accessory muscles, clear without rales or rhonchi  Cardiovascular: RRR, heart sounds normal, no murmur or gallops, no peripheral edema  Musculoskeletal: No deformities, no cyanosis or clubbing  Neuro: alert, non focal  Skin: Warm, no lesions or rash    CT chest 03/14/18 --  COMPARISON:  Low-dose lung  cancer screening chest CT 12/13/2017.  FINDINGS: Cardiovascular: Heart size is normal. There is no significant pericardial fluid, thickening or pericardial calcification. There is aortic atherosclerosis, as well as atherosclerosis of the great vessels of the mediastinum and the coronary arteries, including calcified atherosclerotic plaque in the left main, left anterior descending, left circumflex and right coronary arteries.  Mediastinum/Nodes: No pathologically enlarged mediastinal or hilar lymph nodes. Please note that accurate exclusion of hilar adenopathy is limited on noncontrast CT scans. Esophagus is unremarkable in appearance. No axillary lymphadenopathy.  Lungs/Pleura: Multiple pulmonary nodules are again noted in the lungs bilaterally, generally similar to the prior examination, including one of the larger nodules in the right lower lobe. However, the largest nodule in the right lower lobe which is highly irregular in shape (axial image 254 of series 3), demonstrates a volume derived mean diameter of 13.6 mm on today's examination, significantly increased from 9.9 mm on the prior study. Diffuse bronchial wall thickening with moderate centrilobular and paraseptal emphysema. Scarring in the apex of the lungs bilaterally. No acute consolidative airspace disease. No pleural effusions.  Upper Abdomen: Aortic atherosclerosis.  Musculoskeletal: There are no aggressive appearing lytic or blastic lesions noted in the visualized portions of the skeleton.  IMPRESSION: 1. Lung-RADS 4BS, suspicious. Additional imaging  evaluation or consultation with Pulmonology or Thoracic Surgery recommended. 2. The "S" modifier above refers to potentially clinically significant non lung cancer related findings. Specifically, there is aortic atherosclerosis, in addition to left main and 3 vessel coronary artery disease. Please note that although the presence of coronary artery calcium documents the presence of coronary artery disease, the severity of this disease and any potential stenosis cannot be assessed on this non-gated CT examination. Assessment for potential risk factor modification, dietary therapy or pharmacologic therapy may be warranted, if clinically indicated. 3. Mild diffuse bronchial thickening with moderate centrilobular and paraseptal emphysema; imaging findings suggestive of underlying COPD.    PET scan 03/22/18 --  COMPARISON:  Chest CT on 03/14/2018  FINDINGS: Mediastinal blood pool activity: SUV max = 1.6  NECK:  No hypermetabolic lymph nodes or masses.  Incidental CT findings:  None.  CHEST: No hypermetabolic lymphadenopathy. Two adjacent pulmonary nodules are seen in the posterior right lower lobe. 11 mm nodule on image 58/8 is mostly solid, and adjacent sub-solid nodule measures 19 mm on image 65/8. These are hypermetabolic, with SUV max of 2.4. No other suspicious pulmonary nodules identified. No evidence of pleural effusion.  Incidental CT findings: Moderate centrilobular emphysema.  ABDOMEN/PELVIS: No abnormal hypermetabolic activity within the liver, pancreas, adrenal glands, or spleen. No hypermetabolic lymph nodes in the abdomen or pelvis.  Incidental CT findings:  Aortic atherosclerosis.  SKELETON: No focal hypermetabolic bone lesions to suggest skeletal metastasis.  Incidental CT findings:  None.  IMPRESSION: Two adjacent hypermetabolic pulmonary nodules in posterior right lower lobe, highly suspicious for bronchogenic carcinoma.  No evidence of local  or distant metastatic disease.  Moderate emphysema.      Assessment & Plan:  COPD, mild Please continue Spiriva once daily as you have been taking it. Keep albuterol available to use 2 puffs if needed for shortness of breath, wheezing, chest tightness.  Pulmonary nodules/lesions, multiple Your CT scan of the chest shows that your right lower lobe pulmonary nodules are still present.  They have increased in size and character slightly over serial films.  Dr. Lamonte Sakai will discuss your case with thoracic surgery so that we can plan appropriate biopsy either by bronchoscopy or a surgical  approach. Follow with Dr Lamonte Sakai in 1 month  Baltazar Apo, MD, PhD 07/18/2018, 1:52 PM [facility] [phone] or if no answer [phone]

## 2018-07-20 ENCOUNTER — Telehealth: Payer: Self-pay | Admitting: Emergency Medicine

## 2018-07-20 DIAGNOSIS — R918 Other nonspecific abnormal finding of lung field: Secondary | ICD-10-CM

## 2018-07-20 NOTE — Telephone Encounter (Signed)
Attempted to call pt. I did not receive an answer. I have left a message for pt to return our call.  

## 2018-07-20 NOTE — Telephone Encounter (Signed)
Called patient, unable to reach left message to give us a call back. 

## 2018-07-20 NOTE — Telephone Encounter (Signed)
Pt needs to be set up for ENB. Order has been placed per RB. Super D CT was done on 06/14/18. I will contact CT to have this disc sent over to [place].  Attempted to call pt. I did not receive an answer. I have left a message for pt to return our call.

## 2018-07-20 NOTE — Telephone Encounter (Signed)
Spoke with pt. She is aware of this information.  I have called CT and left a message to have a disc created of the pt's Super D CT. I will leave message open until disc is received.

## 2018-07-20 NOTE — Telephone Encounter (Signed)
Pt returning call and can be reached @ [phone].Lindsey Rowland

## 2018-07-20 NOTE — Telephone Encounter (Signed)
Pt is calling back [phone]

## 2018-07-20 NOTE — Telephone Encounter (Signed)
Pt is calling back [phone]

## 2018-07-23 NOTE — Telephone Encounter (Signed)
Spoke with Charenton from [facility]. They no longer have the ability to make a disc of the pt's CT.  Spoke with Dr. Lamonte Sakai and he states to not worry about this at this time. He can view the pt's CT at the hospital before he does the procedure. Message will be closed.

## 2018-07-26 ENCOUNTER — Telehealth: Payer: Self-pay | Admitting: Emergency Medicine

## 2018-07-26 NOTE — Telephone Encounter (Signed)
done

## 2018-07-26 NOTE — Telephone Encounter (Signed)
Patient needs orders placed for bronch please.  Thanks!

## 2018-07-27 NOTE — Pre-Procedure Instructions (Signed)
Lindsey Rowland  07/27/2018      [id number] - [place], [place] - [address] [place] [address] Phone: [phone] Fax: [phone]    Your procedure is scheduled on Wednesday August  28th.  Report to [facility] Admitting at [place].[facility].  Call this number if you have problems the morning of surgery:  [phone]   Remember:  Do not eat or drink after midnight.      Take these medicines the morning of surgery with A SIP OF WATER   Albuterol Inhaler (if needed)  Flexeril (if needed)  Lomotil (if needed)  Pantoprazole (Protonix)  Polyethyl Glycolpropyl eye drops (if needed)  Spiriva Inhaler   Zanaflex (if needed)  7 days prior to surgery STOP taking any Aspirin(unless otherwise instructed by your surgeon), Celebrex, Aleve, Naproxen, Ibuprofen, Motrin, Advil, Goody's, BC's, all herbal medications, fish oil, and all vitamins       Do not wear jewelry, make-up or nail polish.  Do not wear lotions, powders, or perfumes, or deodorant.  Do not shave 48 hours prior to surgery.  Men may shave face and neck.  Do not bring valuables to the hospital.  [facility] is not responsible for any belongings or valuables.  Contacts, dentures or bridgework may not be worn into surgery.  Leave your suitcase in the car.  After surgery it may be brought to your room.  For patients admitted to the hospital, discharge time will be determined by your treatment team.  Patients discharged the day of surgery will not be allowed to drive home.    [place]- Preparing For Surgery  Before surgery, you can play an important role. Because skin is not sterile, your skin needs to be as free of germs as possible. You can reduce the number of germs on your skin by washing with CHG (chlorahexidine gluconate) Soap before surgery.  CHG is an antiseptic cleaner which kills germs and bonds with the skin to continue killing germs even after washing.    Oral Hygiene  is also important to reduce your risk of infection.  Remember - BRUSH YOUR TEETH THE MORNING OF SURGERY WITH YOUR REGULAR TOOTHPASTE  Please do not use if you have an allergy to CHG or antibacterial soaps. If your skin becomes reddened/irritated stop using the CHG.  Do not shave (including legs and underarms) for at least 48 hours prior to first CHG shower. It is OK to shave your face.  Please follow these instructions carefully.   1. Shower the NIGHT BEFORE SURGERY and the MORNING OF SURGERY with CHG.   2. If you chose to wash your hair, wash your hair first as usual with your normal shampoo.  3. After you shampoo, rinse your hair and body thoroughly to remove the shampoo.  4. Use CHG as you would any other liquid soap. You can apply CHG directly to the skin and wash gently with a scrungie or a clean washcloth.   5. Apply the CHG Soap to your body ONLY FROM THE NECK DOWN.  Do not use on open wounds or open sores. Avoid contact with your eyes, ears, mouth and genitals (private parts). Wash Face and genitals (private parts)  with your normal soap.  6. Wash thoroughly, paying special attention to the area where your surgery will be performed.  7. Thoroughly rinse your body with warm water from the neck down.  8. DO NOT shower/wash with your normal soap after  using and rinsing off the CHG Soap.  9. Pat yourself dry with a CLEAN TOWEL.  10. Wear CLEAN PAJAMAS to bed the night before surgery, wear comfortable clothes the morning of surgery  11. Place CLEAN SHEETS on your bed the night of your first shower and DO NOT SLEEP WITH PETS.    Day of Surgery:  Do not apply any deodorants/lotions.  Please wear clean clothes to the hospital/surgery center.   Remember to brush your teeth WITH YOUR REGULAR TOOTHPASTE.    Please read over the following fact sheets that you were given. Coughing and Deep Breathing

## 2018-07-30 ENCOUNTER — Encounter (HOSPITAL_COMMUNITY)
Admission: RE | Admit: 2018-07-30 | Discharge: 2018-07-30 | Disposition: A | Discharge status: Home / Self Care | Payer: PPO | Source: Ambulatory Visit | Attending: Emergency Medicine | Admitting: Emergency Medicine

## 2018-07-30 ENCOUNTER — Other Ambulatory Visit: Payer: Self-pay

## 2018-07-30 ENCOUNTER — Encounter (HOSPITAL_COMMUNITY): Payer: Self-pay

## 2018-07-30 DIAGNOSIS — Z880 Allergy status to penicillin: Secondary | ICD-10-CM | POA: Diagnosis not present

## 2018-07-30 DIAGNOSIS — Z8612 Personal history of poliomyelitis: Secondary | ICD-10-CM | POA: Diagnosis not present

## 2018-07-30 DIAGNOSIS — C3431 Malignant neoplasm of lower lobe, right bronchus or lung: Secondary | ICD-10-CM | POA: Diagnosis not present

## 2018-07-30 DIAGNOSIS — I2510 Atherosclerotic heart disease of native coronary artery without angina pectoris: Secondary | ICD-10-CM | POA: Diagnosis not present

## 2018-07-30 DIAGNOSIS — I10 Essential (primary) hypertension: Secondary | ICD-10-CM | POA: Diagnosis not present

## 2018-07-30 DIAGNOSIS — J984 Other disorders of lung: Secondary | ICD-10-CM | POA: Diagnosis not present

## 2018-07-30 DIAGNOSIS — Z9889 Other specified postprocedural states: Secondary | ICD-10-CM

## 2018-07-30 DIAGNOSIS — J449 Chronic obstructive pulmonary disease, unspecified: Secondary | ICD-10-CM | POA: Diagnosis not present

## 2018-07-30 DIAGNOSIS — R918 Other nonspecific abnormal finding of lung field: Secondary | ICD-10-CM | POA: Diagnosis present

## 2018-07-30 DIAGNOSIS — Z79899 Other long term (current) drug therapy: Secondary | ICD-10-CM | POA: Diagnosis not present

## 2018-07-30 DIAGNOSIS — M8580 Other specified disorders of bone density and structure, unspecified site: Secondary | ICD-10-CM | POA: Diagnosis not present

## 2018-07-30 DIAGNOSIS — M1990 Unspecified osteoarthritis, unspecified site: Secondary | ICD-10-CM | POA: Diagnosis not present

## 2018-07-30 DIAGNOSIS — F17210 Nicotine dependence, cigarettes, uncomplicated: Secondary | ICD-10-CM | POA: Diagnosis not present

## 2018-07-30 HISTORY — DX: Malignant (primary) neoplasm, unspecified: C80.1

## 2018-07-30 LAB — PROTIME-INR
INR: 0.97
Prothrombin Time: 12.7 seconds (ref 11.4–15.2)

## 2018-07-30 LAB — COMPREHENSIVE METABOLIC PANEL
ALK PHOS: 74 U/L (ref 38–126)
ALT: 16 U/L (ref 0–44)
AST: 25 U/L (ref 15–41)
Albumin: 4.2 g/dL (ref 3.5–5.0)
Anion gap: 10 (ref 5–15)
BUN: 12 mg/dL (ref 8–23)
CALCIUM: 9.3 mg/dL (ref 8.9–10.3)
CO2: 27 mmol/L (ref 22–32)
CREATININE: 0.66 mg/dL (ref 0.44–1.00)
Chloride: 106 mmol/L (ref 98–111)
Glucose, Bld: 61 mg/dL — ABNORMAL LOW (ref 70–99)
Potassium: 3.6 mmol/L (ref 3.5–5.1)
SODIUM: 143 mmol/L (ref 135–145)
Total Bilirubin: 0.4 mg/dL (ref 0.3–1.2)
Total Protein: 6.6 g/dL (ref 6.5–8.1)

## 2018-07-30 LAB — CBC
HCT: 44.8 % (ref 36.0–46.0)
HEMOGLOBIN: 14.7 g/dL (ref 12.0–15.0)
MCH: 33.8 pg (ref 26.0–34.0)
MCHC: 32.8 g/dL (ref 30.0–36.0)
MCV: 103 fL — AB (ref 78.0–100.0)
Platelets: 267 10*3/uL (ref 150–400)
RBC: 4.35 MIL/uL (ref 3.87–5.11)
RDW: 13.5 % (ref 11.5–15.5)
WBC: 6.2 10*3/uL (ref 4.0–10.5)

## 2018-07-30 LAB — APTT: aPTT: 31 seconds (ref 24–36)

## 2018-07-30 NOTE — Pre-Procedure Instructions (Signed)
Lindsey Rowland  07/30/2018      [id number] - [place],  - [address] [place] [address] Phone: [phone] Fax: [phone]    Your procedure is scheduled on Wednesday August  28th.  Report to [facility] Admitting at [email or web address].  Call this number if you have problems the morning of surgery:  [phone]   Remember:  Do not eat or drink after midnight.      Take these medicines the morning of surgery with A SIP OF WATER   Albuterol Inhaler (if needed)  Flexeril (if needed)  Lomotil (if needed)  Pantoprazole (Protonix)  Polyethyl Glycolpropyl eye drops (if needed)  Spiriva Inhaler   Zanaflex (if needed)  PLEASE BRING INHALERS THE MORNING OF SURGERY  7 days prior to surgery STOP taking any Aspirin(unless otherwise instructed by your surgeon), Celebrex, Aleve, Naproxen, Ibuprofen, Motrin, Advil, Goody's, BC's, all herbal medications, fish oil, and all vitamins     Do not wear jewelry, make-up or nail polish.  Do not wear lotions, powders, or perfumes, or deodorant.  Do not shave 48 hours prior to surgery.  Men may shave face and neck.  Do not bring valuables to the hospital.  [facility] is not responsible for any belongings or valuables.  Contacts, dentures or bridgework may not be worn into surgery.  Leave your suitcase in the car.  After surgery it may be brought to your room.  For patients admitted to the hospital, discharge time will be determined by your treatment team.  Patients discharged the day of surgery will not be allowed to drive home.    [place]- Preparing For Surgery  Before surgery, you can play an important role. Because skin is not sterile, your skin needs to be as free of germs as possible. You can reduce the number of germs on your skin by washing with CHG (chlorahexidine gluconate) Soap before surgery.  CHG is an antiseptic cleaner which kills germs and bonds with the skin to continue  killing germs even after washing.    Oral Hygiene is also important to reduce your risk of infection.  Remember - BRUSH YOUR TEETH THE MORNING OF SURGERY WITH YOUR REGULAR TOOTHPASTE  Please do not use if you have an allergy to CHG or antibacterial soaps. If your skin becomes reddened/irritated stop using the CHG.  Do not shave (including legs and underarms) for at least 48 hours prior to first CHG shower. It is OK to shave your face.  Please follow these instructions carefully.   1. Shower the NIGHT BEFORE SURGERY and the MORNING OF SURGERY with CHG.   2. If you chose to wash your hair, wash your hair first as usual with your normal shampoo.  3. After you shampoo, rinse your hair and body thoroughly to remove the shampoo.  4. Use CHG as you would any other liquid soap. You can apply CHG directly to the skin and wash gently with a scrungie or a clean washcloth.   5. Apply the CHG Soap to your body ONLY FROM THE NECK DOWN.  Do not use on open wounds or open sores. Avoid contact with your eyes, ears, mouth and genitals (private parts). Wash Face and genitals (private parts)  with your normal soap.  6. Wash thoroughly, paying special attention to the area where your surgery will be performed.  7. Thoroughly rinse your body with warm water from the neck down.  8. DO NOT  shower/wash with your normal soap after using and rinsing off the CHG Soap.  9. Pat yourself dry with a CLEAN TOWEL.  10. Wear CLEAN PAJAMAS to bed the night before surgery, wear comfortable clothes the morning of surgery  11. Place CLEAN SHEETS on your bed the night of your first shower and DO NOT SLEEP WITH PETS.    Day of Surgery:  Do not apply any deodorants/lotions.  Please wear clean clothes to the hospital/surgery center.   Remember to brush your teeth WITH YOUR REGULAR TOOTHPASTE.    Please read over the following fact sheets that you were given. Coughing and Deep Breathing

## 2018-07-30 NOTE — Progress Notes (Signed)
PCP - Bluford Kaufmann Cardiologist - denies  Chest x-ray - not needed EKG - not needed Stress Test - 2019 ECHO - denies Cardiac Cath - denies  Anesthesia review: yes recent cardiac testing  Patient denies shortness of breath, fever, cough and chest pain at PAT appointment   Patient verbalized understanding of instructions that were given to them at the PAT appointment. Patient was also instructed that they will need to review over the PAT instructions again at home before surgery.

## 2018-07-31 NOTE — Progress Notes (Addendum)
Anesthesia Chart Review:  Case:  [id number] Date/Time:  08/01/18 0815   Procedure:  VIDEO BRONCHOSCOPY WITH ENDOBRONCHIAL NAVIGATION (N/A )   Anesthesia type:  General   Pre-op diagnosis:  PULMONARY NODULE/LESIONS MULTIPLE   Location:  [facility] OR ROOM 10 / [place] OR   Surgeon:  Collene Gobble, MD      DISCUSSION: 67 yo female current smoker for above procedure. Pertinent hx includes PUD, Hx of Polio, COPD, HA.  She recently underwent preop cardiopulmonary stress testing. Results showed "Exercise testing with gas exchange demonstrates mild functional impairment when compared to matched sedentary norms. At peak exercise, patient is imited due to COPD with reduced pre-exercise PFTs and elevated VE/VCO2 slope. DeltaVO2/Delta WR is within normal range and therefore there does not appear to be any cardiovascular limitation."  Anticipate she can proceed with surgery as planned barring acute status change.  VS: BP 134/64   Pulse 86   Temp 36.8 C   Resp 18   Ht 5\' 4"  (1.626 m)   Wt 45 kg   SpO2 97%   BMI 17.03 kg/m   PROVIDERS: Marletta Lor, MD is PCP   LABS: Labs reviewed: Acceptable for surgery. (all labs ordered are listed, but only abnormal results are displayed)  Labs Reviewed  CBC - Abnormal; Notable for the following components:      Result Value   MCV 103.0 (*)    All other components within normal limits  COMPREHENSIVE METABOLIC PANEL - Abnormal; Notable for the following components:   Glucose, Bld 61 (*)    All other components within normal limits  APTT  PROTIME-INR     IMAGES: CT Chest 06/14/2018: IMPRESSION: 1. Reticulonodular complex in the RIGHT lower lobe which was hypermetabolic on comparison PET-CT scan. Differential includes neoplastic versus infectious or inflammatory processes. 2. Extensive upper lobe centrilobular emphysema   EKG: N/A   CV: CPX Testing 04/10/2018: Interpretation  Notes: Patient gave a very good effort despite her leg weakness and  pain. Pulse-oximetry remained 95-100% for the duration of exercise. Exercise was performed on a cycle ergometer starting at [facility] and increasing by 10W/min.   ECG: Resting ECG in sinus rhythm. HR response blunted. There were no sustained arrhythmias and no ST-T changes. BP response mildly hypertensive.   PFT: Pre-exercise spirometry demonstrates moderate obstruction. MVV is below normal.   CPX: Exercise Capacity- Exercise testing with gas exchange demonstrates a mildly reduced peak VO2 of 17.0 ml/kg/min (78% of the age/gender/weight matched sedentary norms). The RER of 1.07 indicates a near maximal effort.    Cardiovascular response- The O2pulse (a surrogate for strokevolume) increased with incremental exercise reaching peak at 6 ml/beat (86% predicted). DeltaVO2/Delta WR is normal at 10.4 Indicating no evidence of cardiovascular impairment.  Ventilatory response- The VE/VCO2 slope is markedly elevated and indicates Increased dead space ventilation. The oxygen uptake efficiency slope (OUES) is reduced. The VO2 at the ventilatory threshold was normal at 66% of the predicted peak VO2 and 85% measured PVO2. At peak exercise, the ventilation reached 78% of the measured MVV and breathing reserve was 13 indicating ventilatory reserve was beginning to deplete. PETCO2 was slightly reduced at 23 mmHg during exercise.   Conclusion: Exercise testing with gas exchange demonstrates mild functional impairment when compared to matched sedentary norms. At peak exercise, patient is imited due to COPD with reduced pre-exercise PFTs and elevated VE/VCO2 slope. DeltaVO2/Delta WR is within normal range and therefore there does not appear to be any cardiovascular limitation.   Past Medical  History:  Diagnosis Date  . Arthritis   . Cancer [facility])    "pre-cervical" cells  - age 61  . COPD (chronic obstructive pulmonary disease) ([place])   . Headache(784.0)   . Osteopenia   . Polio    as a child  . Ulcer    stomach  hx    Past Surgical History:  Procedure Laterality Date  . APPENDECTOMY    . CATARACT EXTRACTION     right  . DILATION AND CURETTAGE OF UTERUS    . FOOT SURGERY Bilateral   . hip si joint     injection/ablation  . RETINAL DETACHMENT SURGERY  12/2010   right eye  . TONSILLECTOMY      MEDICATIONS: . albuterol (PROAIR HFA) 108 (90 Base) MCG/ACT inhaler  . Aspirin-Salicylamide-Caffeine (BC HEADACHE POWDER PO)  . atorvastatin (LIPITOR) 10 MG tablet  . celecoxib (CELEBREX) 400 MG capsule  . cyclobenzaprine (FLEXERIL) 10 MG tablet  . diazepam (VALIUM) 10 MG tablet  . diphenoxylate-atropine (LOMOTIL) 2.5-0.025 MG tablet  . Multiple Vitamin (MULITIVITAMIN WITH MINERALS) TABS  . pantoprazole (PROTONIX) 40 MG tablet  . Polyethyl Glycol-Propyl Glycol (SYSTANE OP)  . rOPINIRole (REQUIP) 0.5 MG tablet  . Tiotropium Bromide Monohydrate (SPIRIVA RESPIMAT) 2.5 MCG/ACT AERS  . tiZANidine (ZANAFLEX) 4 MG tablet   No current facility-administered medications for this encounter.     Wynonia Musty [facility] Short Stay Center/Anesthesiology Phone [phone] 07/31/2018 9:23 AM

## 2018-08-01 ENCOUNTER — Other Ambulatory Visit: Payer: Self-pay | Admitting: *Deleted

## 2018-08-01 ENCOUNTER — Ambulatory Visit (HOSPITAL_COMMUNITY)
Admission: RE | Admit: 2018-08-01 | Discharge: 2018-08-01 | Disposition: A | Discharge status: Home / Self Care | Payer: PPO | Source: Ambulatory Visit | Attending: Emergency Medicine | Admitting: Emergency Medicine

## 2018-08-01 ENCOUNTER — Ambulatory Visit (HOSPITAL_COMMUNITY): Payer: PPO | Admitting: Physician Assistant

## 2018-08-01 ENCOUNTER — Ambulatory Visit (HOSPITAL_COMMUNITY): Payer: PPO

## 2018-08-01 ENCOUNTER — Encounter (HOSPITAL_COMMUNITY): Payer: Self-pay | Admitting: *Deleted

## 2018-08-01 ENCOUNTER — Encounter (HOSPITAL_COMMUNITY)
Admission: RE | Disposition: A | Discharge status: Home / Self Care | Payer: Self-pay | Source: Ambulatory Visit | Attending: Emergency Medicine

## 2018-08-01 DIAGNOSIS — J449 Chronic obstructive pulmonary disease, unspecified: Secondary | ICD-10-CM | POA: Insufficient documentation

## 2018-08-01 DIAGNOSIS — Z8612 Personal history of poliomyelitis: Secondary | ICD-10-CM | POA: Diagnosis not present

## 2018-08-01 DIAGNOSIS — R911 Solitary pulmonary nodule: Secondary | ICD-10-CM | POA: Diagnosis not present

## 2018-08-01 DIAGNOSIS — Z880 Allergy status to penicillin: Secondary | ICD-10-CM | POA: Insufficient documentation

## 2018-08-01 DIAGNOSIS — R846 Abnormal cytological findings in specimens from respiratory organs and thorax: Secondary | ICD-10-CM | POA: Diagnosis not present

## 2018-08-01 DIAGNOSIS — J984 Other disorders of lung: Secondary | ICD-10-CM | POA: Insufficient documentation

## 2018-08-01 DIAGNOSIS — M1990 Unspecified osteoarthritis, unspecified site: Secondary | ICD-10-CM | POA: Insufficient documentation

## 2018-08-01 DIAGNOSIS — F17210 Nicotine dependence, cigarettes, uncomplicated: Secondary | ICD-10-CM | POA: Insufficient documentation

## 2018-08-01 DIAGNOSIS — I10 Essential (primary) hypertension: Secondary | ICD-10-CM | POA: Diagnosis not present

## 2018-08-01 DIAGNOSIS — I2510 Atherosclerotic heart disease of native coronary artery without angina pectoris: Secondary | ICD-10-CM | POA: Insufficient documentation

## 2018-08-01 DIAGNOSIS — M8580 Other specified disorders of bone density and structure, unspecified site: Secondary | ICD-10-CM | POA: Insufficient documentation

## 2018-08-01 DIAGNOSIS — C3491 Malignant neoplasm of unspecified part of right bronchus or lung: Secondary | ICD-10-CM | POA: Diagnosis not present

## 2018-08-01 DIAGNOSIS — R918 Other nonspecific abnormal finding of lung field: Secondary | ICD-10-CM | POA: Diagnosis not present

## 2018-08-01 DIAGNOSIS — J189 Pneumonia, unspecified organism: Secondary | ICD-10-CM | POA: Diagnosis not present

## 2018-08-01 DIAGNOSIS — Z419 Encounter for procedure for purposes other than remedying health state, unspecified: Secondary | ICD-10-CM

## 2018-08-01 DIAGNOSIS — J439 Emphysema, unspecified: Secondary | ICD-10-CM | POA: Diagnosis not present

## 2018-08-01 DIAGNOSIS — C3431 Malignant neoplasm of lower lobe, right bronchus or lung: Secondary | ICD-10-CM | POA: Diagnosis not present

## 2018-08-01 DIAGNOSIS — Z79899 Other long term (current) drug therapy: Secondary | ICD-10-CM | POA: Insufficient documentation

## 2018-08-01 DIAGNOSIS — Z9889 Other specified postprocedural states: Secondary | ICD-10-CM

## 2018-08-01 HISTORY — PX: VIDEO BRONCHOSCOPY WITH ENDOBRONCHIAL NAVIGATION: SHX6175

## 2018-08-01 SURGERY — VIDEO BRONCHOSCOPY WITH ENDOBRONCHIAL NAVIGATION
Anesthesia: General | Site: Chest | Laterality: Right

## 2018-08-01 MED ORDER — SUGAMMADEX SODIUM 200 MG/2ML IV SOLN
INTRAVENOUS | Status: DC | PRN
  Administered 2018-08-01: 89.4 mg via INTRAVENOUS

## 2018-08-01 MED ORDER — EPHEDRINE 5 MG/ML INJ
INTRAVENOUS | Status: AC
  Filled 2018-08-01: qty 10

## 2018-08-01 MED ORDER — PROPOFOL 10 MG/ML IV BOLUS
INTRAVENOUS | Status: AC
  Filled 2018-08-01: qty 40

## 2018-08-01 MED ORDER — LIDOCAINE 2% (20 MG/ML) 5 ML SYRINGE
INTRAMUSCULAR | Status: DC | PRN
  Administered 2018-08-01: 75 mg via INTRAVENOUS

## 2018-08-01 MED ORDER — ONDANSETRON HCL 4 MG/2ML IJ SOLN
INTRAMUSCULAR | Status: AC
  Filled 2018-08-01: qty 2

## 2018-08-01 MED ORDER — LIDOCAINE 2% (20 MG/ML) 5 ML SYRINGE
INTRAMUSCULAR | Status: AC
  Filled 2018-08-01: qty 5

## 2018-08-01 MED ORDER — ALBUTEROL SULFATE HFA 108 (90 BASE) MCG/ACT IN AERS: 2.0000 | INHALATION_SPRAY | Freq: Four times a day (QID) | RESPIRATORY_TRACT | Status: DC | PRN

## 2018-08-01 MED ORDER — EPHEDRINE SULFATE 50 MG/ML IJ SOLN
INTRAMUSCULAR | Status: DC | PRN
  Administered 2018-08-01 (×3): 10 mg via INTRAVENOUS

## 2018-08-01 MED ORDER — SUCCINYLCHOLINE CHLORIDE 20 MG/ML IJ SOLN
INTRAMUSCULAR | Status: DC | PRN
  Administered 2018-08-01: 100 mg via INTRAVENOUS

## 2018-08-01 MED ORDER — DEXAMETHASONE SODIUM PHOSPHATE 10 MG/ML IJ SOLN
INTRAMUSCULAR | Status: AC
  Filled 2018-08-01: qty 1

## 2018-08-01 MED ORDER — MIDAZOLAM HCL 5 MG/5ML IJ SOLN
INTRAMUSCULAR | Status: DC | PRN
  Administered 2018-08-01: 2 mg via INTRAVENOUS

## 2018-08-01 MED ORDER — MIDAZOLAM HCL 2 MG/2ML IJ SOLN
INTRAMUSCULAR | Status: AC
  Filled 2018-08-01: qty 2

## 2018-08-01 MED ORDER — SUCCINYLCHOLINE CHLORIDE 200 MG/10ML IV SOSY
PREFILLED_SYRINGE | INTRAVENOUS | Status: AC
  Filled 2018-08-01: qty 10

## 2018-08-01 MED ORDER — PHENYLEPHRINE HCL 10 MG/ML IJ SOLN
INTRAMUSCULAR | Status: DC | PRN
  Administered 2018-08-01 (×2): 80 ug via INTRAVENOUS

## 2018-08-01 MED ORDER — DIAZEPAM 10 MG PO TABS: 10.0000 mg | ORAL_TABLET | Freq: Every evening | ORAL | Status: DC | PRN

## 2018-08-01 MED ORDER — LACTATED RINGERS IV SOLN
INTRAVENOUS | Status: DC
  Administered 2018-08-01: 08:00:00 via INTRAVENOUS

## 2018-08-01 MED ORDER — DIPHENOXYLATE-ATROPINE 2.5-0.025 MG PO TABS: 1.0000 | ORAL_TABLET | Freq: Four times a day (QID) | ORAL | 0 refills | Status: DC | PRN

## 2018-08-01 MED ORDER — DEXAMETHASONE SODIUM PHOSPHATE 10 MG/ML IJ SOLN
INTRAMUSCULAR | Status: DC | PRN
  Administered 2018-08-01: 10 mg via INTRAVENOUS

## 2018-08-01 MED ORDER — ROCURONIUM BROMIDE 50 MG/5ML IV SOSY
PREFILLED_SYRINGE | INTRAVENOUS | Status: AC
  Filled 2018-08-01: qty 5

## 2018-08-01 MED ORDER — PANTOPRAZOLE SODIUM 40 MG PO TBEC: 40.0000 mg | DELAYED_RELEASE_TABLET | Freq: Every day | ORAL | Status: DC

## 2018-08-01 MED ORDER — FENTANYL CITRATE (PF) 100 MCG/2ML IJ SOLN: 25.0000 ug | INTRAMUSCULAR | Status: DC | PRN

## 2018-08-01 MED ORDER — PROPOFOL 10 MG/ML IV BOLUS
INTRAVENOUS | Status: DC | PRN
  Administered 2018-08-01: 130 mg via INTRAVENOUS
  Administered 2018-08-01: 20 mg via INTRAVENOUS
  Administered 2018-08-01: 10 mg via INTRAVENOUS

## 2018-08-01 MED ORDER — FENTANYL CITRATE (PF) 100 MCG/2ML IJ SOLN
INTRAMUSCULAR | Status: DC | PRN
  Administered 2018-08-01: 50 ug via INTRAVENOUS
  Administered 2018-08-01: 100 ug via INTRAVENOUS

## 2018-08-01 MED ORDER — ROCURONIUM BROMIDE 50 MG/5ML IV SOSY
PREFILLED_SYRINGE | INTRAVENOUS | Status: DC | PRN
  Administered 2018-08-01: 30 mg via INTRAVENOUS
  Administered 2018-08-01: 10 mg via INTRAVENOUS

## 2018-08-01 MED ORDER — ROPINIROLE HCL 0.5 MG PO TABS: 0.5000 mg | ORAL_TABLET | Freq: Every evening | ORAL | Status: DC | PRN

## 2018-08-01 MED ORDER — FENTANYL CITRATE (PF) 250 MCG/5ML IJ SOLN
INTRAMUSCULAR | Status: AC
  Filled 2018-08-01: qty 5

## 2018-08-01 MED ORDER — PHENYLEPHRINE 40 MCG/ML (10ML) SYRINGE FOR IV PUSH (FOR BLOOD PRESSURE SUPPORT)
PREFILLED_SYRINGE | INTRAVENOUS | Status: AC
  Filled 2018-08-01: qty 10

## 2018-08-01 MED ORDER — ONDANSETRON HCL 4 MG/2ML IJ SOLN
INTRAMUSCULAR | Status: DC | PRN
  Administered 2018-08-01: 4 mg via INTRAVENOUS

## 2018-08-01 SURGICAL SUPPLY — 42 items
ADAPTER BRONCH F/PENTAX (ADAPTER) ×2 IMPLANT
ADPR BSCP EDG PNTX (ADAPTER) ×1
BRUSH CYTOL CELLEBRITY 1.5X140 (MISCELLANEOUS) ×2 IMPLANT
BRUSH SUPERTRAX BIOPSY (INSTRUMENTS) IMPLANT
BRUSH SUPERTRAX NDL-TIP CYTO (INSTRUMENTS) ×3 IMPLANT
CANISTER SUCT 3000ML PPV (MISCELLANEOUS) ×2 IMPLANT
CHANNEL WORK EXTEND EDGE 180 (KITS) IMPLANT
CHANNEL WORK EXTEND EDGE 45 (KITS) IMPLANT
CHANNEL WORK EXTEND EDGE 90 (KITS) IMPLANT
CONT SPEC 4OZ CLIKSEAL STRL BL (MISCELLANEOUS) ×2 IMPLANT
COVER BACK TABLE 60X90IN (DRAPES) ×2 IMPLANT
COVER DOME SNAP 22 D (MISCELLANEOUS) ×1 IMPLANT
FILTER STRAW FLUID ASPIR (MISCELLANEOUS) IMPLANT
FORCEPS BIOP SUPERTRX PREMAR (INSTRUMENTS) ×3 IMPLANT
GAUZE SPONGE 4X4 12PLY STRL (GAUZE/BANDAGES/DRESSINGS) ×2 IMPLANT
GLOVE BIO SURGEON STRL SZ7.5 (GLOVE) ×4 IMPLANT
GOWN STRL REUS W/ TWL LRG LVL3 (GOWN DISPOSABLE) ×2 IMPLANT
GOWN STRL REUS W/TWL LRG LVL3 (GOWN DISPOSABLE) ×4
KIT CLEAN ENDO COMPLIANCE (KITS) ×2 IMPLANT
KIT LOCATABLE GUIDE (CANNULA) IMPLANT
KIT MARKER FIDUCIAL DELIVERY (KITS) ×1 IMPLANT
KIT PROCEDURE EDGE 180 (KITS) ×1 IMPLANT
KIT PROCEDURE EDGE 45 (KITS) IMPLANT
KIT PROCEDURE EDGE 90 (KITS) IMPLANT
KIT TURNOVER KIT B (KITS) ×2 IMPLANT
MARKER FIDUCIAL SL NIT COIL (Implant Marker) ×3 IMPLANT
MARKER SKIN DUAL TIP RULER LAB (MISCELLANEOUS) ×2 IMPLANT
NDL SUPERTRX PREMARK BIOPSY (NEEDLE) ×1 IMPLANT
NEEDLE SUPERTRX PREMARK BIOPSY (NEEDLE) ×4 IMPLANT
NS IRRIG 1000ML POUR BTL (IV SOLUTION) ×2 IMPLANT
OIL SILICONE PENTAX (PARTS (SERVICE/REPAIRS)) ×2 IMPLANT
PAD ARMBOARD 7.5X6 YLW CONV (MISCELLANEOUS) ×4 IMPLANT
PATCHES PATIENT (LABEL) ×6 IMPLANT
SYR 20CC LL (SYRINGE) ×2 IMPLANT
SYR 20ML ECCENTRIC (SYRINGE) ×2 IMPLANT
SYR 50ML SLIP (SYRINGE) ×2 IMPLANT
TOWEL OR 17X24 6PK STRL BLUE (TOWEL DISPOSABLE) ×2 IMPLANT
TRAP SPECIMEN MUCOUS 40CC (MISCELLANEOUS) IMPLANT
TUBE CONNECTING 20X1/4 (TUBING) ×2 IMPLANT
UNDERPAD 30X30 (UNDERPADS AND DIAPERS) ×2 IMPLANT
VALVE DISPOSABLE (MISCELLANEOUS) ×2 IMPLANT
WATER STERILE IRR 1000ML POUR (IV SOLUTION) ×2 IMPLANT

## 2018-08-01 NOTE — Transfer of Care (Signed)
Immediate Anesthesia Transfer of Care Note  Patient: Lindsey Rowland  Procedure(s) Performed: VIDEO BRONCHOSCOPY WITH ENDOBRONCHIAL NAVIGATION Fiducial Placement (Right Chest)  Patient Location: PACU  Anesthesia Type:General  Level of Consciousness: awake, alert , oriented and sedated  Airway & Oxygen Therapy: Patient Spontanous Breathing and Patient connected to face mask oxygen  Post-op Assessment: Report given to RN, Post -op Vital signs reviewed and stable and Patient moving all extremities  Post vital signs: Reviewed and stable  Last Vitals:  Vitals Value Taken Time  BP 131/62 08/01/2018 10:10 AM  Temp    Pulse 74 08/01/2018 10:12 AM  Resp 17 08/01/2018 10:12 AM  SpO2 100 % 08/01/2018 10:12 AM  Vitals shown include unvalidated device data.  Last Pain:  Vitals:   08/01/18 0701  TempSrc:   PainSc: 0-No pain         Complications: No apparent anesthesia complications

## 2018-08-01 NOTE — Anesthesia Preprocedure Evaluation (Signed)
Anesthesia Evaluation  Patient identified by MRN, date of birth, ID band Patient awake    Reviewed: Allergy & Precautions, NPO status , Patient's Chart, lab work & pertinent test results  Airway Mallampati: II  TM Distance: >3 FB     Dental   Pulmonary COPD, Current Smoker,    breath sounds clear to auscultation       Cardiovascular hypertension, + CAD   Rhythm:Regular Rate:Normal     Neuro/Psych    GI/Hepatic negative GI ROS, Neg liver ROS,   Endo/Other  negative endocrine ROS  Renal/GU negative Renal ROS     Musculoskeletal   Abdominal   Peds  Hematology   Anesthesia Other Findings   Reproductive/Obstetrics                             Anesthesia Physical Anesthesia Plan  ASA: III  Anesthesia Plan: General   Post-op Pain Management:    Induction: Intravenous  PONV Risk Score and Plan: Treatment may vary due to age or medical condition, Ondansetron, Dexamethasone and Midazolam  Airway Management Planned: Oral ETT  Additional Equipment:   Intra-op Plan:   Post-operative Plan: Possible Post-op intubation/ventilation  Informed Consent:   Dental advisory given  Plan Discussed with: CRNA and Anesthesiologist  Anesthesia Plan Comments:         Anesthesia Quick Evaluation

## 2018-08-01 NOTE — Discharge Instructions (Signed)
Flexible Bronchoscopy, Care After These instructions give you information on caring for yourself after your procedure. Your doctor may also give you more specific instructions. Call your doctor if you have any problems or questions after your procedure. Follow these instructions at home:  Do not eat or drink anything for 2 hours after your procedure. If you try to eat or drink before the medicine wears off, food or drink could go into your lungs. You could also burn yourself.  After 2 hours have passed and when you can cough and gag normally, you may eat soft food and drink liquids slowly.  The day after the test, you may eat your normal diet.  You may do your normal activities.  Keep all doctor visits. Get help right away if:  You get more and more short of breath.  You get light-headed.  You feel like you are going to pass out (faint).  You have chest pain.  You have new problems that worry you.  You cough up more than a little blood.  You cough up more blood than before.  Please call our office for any questions or concerns.  [phone].   This information is not intended to replace advice given to you by your health care provider. Make sure you discuss any questions you have with your health care provider. Document Released: 09/18/2009 Document Revised: 04/28/2016 Document Reviewed: 07/26/2013 Elsevier Interactive Patient Education  2017 [employer].

## 2018-08-01 NOTE — Interval H&P Note (Signed)
PCCM Interval Note  Mrs. Brigandi presents today for further evaluation of her right lower lobe opacity.  Its hypermetabolic on PET scan, his change in size and characteristics on serial CT scans.  We have plan for navigational bronchoscopy.  She reports no interval changes, no new medications, no new problems.  Procedure explained in full including benefits and risks.  She understands and elects to proceed.  Her 2 daughters are here with her at bedside they understand as well.  Consent signed  Vitals:   08/01/18 0641 08/01/18 0700  BP: 131/69   Pulse: 61   Resp: 18   Temp: 97.8 F (36.6 C)   TempSrc: Oral   SpO2: 100%   Weight: 44.9 kg 44.7 kg  Height:  5\' 4"  (1.626 m)   Plan:  Navigational bronchoscopy under general anesthesia, right lower lobe opacity brushings, biopsies planned No barriers identified  Baltazar Apo, MD, PhD 08/01/2018, 8:27 AM  Pulmonary and Critical Care [phone] or if no answer [phone]

## 2018-08-01 NOTE — Op Note (Signed)
Video Bronchoscopy with Electromagnetic Navigation Procedure Note  Date of Operation: 08/01/2018  Pre-op Diagnosis: Right lower lobe opacity  Post-op Diagnosis: Same  Surgeon: Baltazar Apo  Assistants: June Leap  Anesthesia: General endotracheal anesthesia  Operation: Flexible video fiberoptic bronchoscopy with electromagnetic navigation and biopsies.  Estimated Blood Loss: Minimal  Complications: None apparent  Indications and History: Lindsey Rowland is a 67 y.o. female with history of tobacco use and COPD.  She was found to have a right lower lobe poorly formed opacity on lung cancer screening CT scan.  This showed some enlargement on serial CT, proved to be mildly hypermetabolic on PET scan.  Recommendation was made to achieve a tissue diagnosis via navigational bronchoscopy.  The risks, benefits, complications, treatment options and expected outcomes were discussed with the patient.  The possibilities of pneumothorax, pneumonia, reaction to medication, pulmonary aspiration, perforation of a viscus, bleeding, failure to diagnose a condition and creating a complication requiring transfusion or operation were discussed with the patient who freely signed the consent.    Description of Procedure: The patient was seen in the Preoperative Area, was examined and was deemed appropriate to proceed.  The patient was taken to OR 10, identified as [name] and the procedure verified as Flexible Video Fiberoptic Bronchoscopy.  A Time Out was held and the above information confirmed.   Prior to the date of the procedure a high-resolution CT scan of the chest was performed. Utilizing [place] a virtual tracheobronchial tree was generated to allow the creation of distinct navigation pathways to the patient's parenchymal abnormalities. After being taken to the operating room general anesthesia was initiated and the patient  was orally intubated. The video fiberoptic  bronchoscope was introduced via the endotracheal tube and a general inspection was performed which showed normal airways throughout.  There were no endobronchial lesions or abnormal secretions seen. The extendable working channel and locator guide were introduced into the bronchoscope. The distinct navigation pathways prepared prior to this procedure were then utilized to navigate to within 0.5 to 1.2 cm of patient's lesion identified on CT scan. The extendable working channel was secured into place and the locator guide was withdrawn. Under fluoroscopic guidance transbronchial needle brushings, transbronchial Wang needle biopsies, and transbronchial forceps biopsies were performed to be sent for cytology and pathology. A bronchioalveolar lavage was performed in the right lower lobe and sent for cytology.  3 fiducial markers were placed triangulating lesion in the event that stereotactic radiation would be beneficial going forward.  At the end of the procedure a general airway inspection was performed and there was no evidence of active bleeding. The bronchoscope was removed.  The patient tolerated the procedure well. There was no significant blood loss and there were no obvious complications. A post-procedural chest x-ray is pending.  Samples: 1. Transbronchial needle brushings from right lower lobe opacity 2. Transbronchial Wang needle biopsies from right lower lobe opacity 3. Transbronchial forceps biopsies from right lower lobe opacity 4. Bronchoalveolar lavage from lower lobe  Plans:  The patient will be discharged from the PACU to home when recovered from anesthesia and after chest x-ray is reviewed. We will review the cytology, pathology results with the patient when they become available. Outpatient followup will be with Dr Lamonte Sakai.   Baltazar Apo, MD, PhD 08/01/2018, 10:05 AM [place] [facility] [phone] or if no answer [phone]

## 2018-08-01 NOTE — Anesthesia Procedure Notes (Signed)
Procedure Name: Intubation Date/Time: 08/01/2018 9:05 AM Performed by: Scheryl Darter, CRNA Pre-anesthesia Checklist: Patient identified, Emergency Drugs available, Suction available and Patient being monitored Patient Re-evaluated:Patient Re-evaluated prior to induction Oxygen Delivery Method: Circle System Utilized Preoxygenation: Pre-oxygenation with 100% oxygen Induction Type: IV induction Ventilation: Mask ventilation without difficulty Tube type: Oral Tube size: 8.5 mm Number of attempts: 1 Airway Equipment and Method: Stylet and Oral airway Placement Confirmation: ETT inserted through vocal cords under direct vision,  positive ETCO2 and breath sounds checked- equal and bilateral Secured at: 22 cm Tube secured with: Tape Dental Injury: Teeth and Oropharynx as per pre-operative assessment

## 2018-08-01 NOTE — Addendum Note (Signed)
Addendum  created 08/01/18 1225 by Scheryl Darter, CRNA   Charge Capture section accepted

## 2018-08-01 NOTE — Anesthesia Postprocedure Evaluation (Signed)
Anesthesia Post Note  Patient: Lindsey Rowland  Procedure(s) Performed: VIDEO BRONCHOSCOPY WITH ENDOBRONCHIAL NAVIGATION Fiducial Placement (Right Chest)     Patient location during evaluation: PACU Anesthesia Type: General Level of consciousness: awake Pain management: pain level controlled Vital Signs Assessment: post-procedure vital signs reviewed and stable Respiratory status: spontaneous breathing Cardiovascular status: stable Anesthetic complications: no    Last Vitals:  Vitals:   08/01/18 1010 08/01/18 1025  BP: 131/62 116/64  Pulse: 73 68  Resp: (!) 7 (!) 23  Temp: (!) 36.1 C   SpO2: 100% 93%    Last Pain:  Vitals:   08/01/18 1010  TempSrc:   PainSc: Asleep                 Mikeila Burgen

## 2018-08-01 NOTE — Progress Notes (Signed)
Per Dr. Lamonte Sakai, cxr looked good and pt was ok to discharge home.

## 2018-08-02 ENCOUNTER — Encounter (HOSPITAL_COMMUNITY): Payer: Self-pay | Admitting: Emergency Medicine

## 2018-08-03 ENCOUNTER — Telehealth: Payer: Self-pay | Admitting: Emergency Medicine

## 2018-08-03 DIAGNOSIS — C3491 Malignant neoplasm of unspecified part of right bronchus or lung: Secondary | ICD-10-CM

## 2018-08-03 NOTE — Telephone Encounter (Signed)
Spoke with patient, requesting letter for insurance policy. Made patient aware MD Byrum would be back in the office on Wednesday of next week. Voiced understanding.   RB please advise if okay. Thanks.

## 2018-08-03 NOTE — Telephone Encounter (Signed)
Reviewed FOB results with her today. 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 Based on PET, probably stage II. Her PFT and CPST make her marginal for VATS resection, but may be a candidate. I will refer her to [facility] so she can discuss possible therapies.

## 2018-08-07 NOTE — Telephone Encounter (Signed)
I will complete this week

## 2018-08-09 ENCOUNTER — Other Ambulatory Visit: Payer: Self-pay | Admitting: *Deleted

## 2018-08-09 ENCOUNTER — Encounter: Payer: Self-pay | Admitting: Emergency Medicine

## 2018-08-09 ENCOUNTER — Encounter: Payer: Self-pay | Admitting: *Deleted

## 2018-08-09 NOTE — Telephone Encounter (Signed)
Letter done. Thanks.

## 2018-08-09 NOTE — Telephone Encounter (Signed)
Letter has been faxed. Pt is aware. Nothing further was needed.

## 2018-08-15 ENCOUNTER — Telehealth: Payer: Self-pay | Admitting: Radiation Oncology

## 2018-08-15 NOTE — Telephone Encounter (Signed)
LVM for patient in reference to [facility] appointment for 9/12

## 2018-08-16 ENCOUNTER — Ambulatory Visit
Admission: RE | Admit: 2018-08-16 | Discharge: 2018-08-16 | Disposition: A | Discharge status: Home / Self Care | Payer: PPO | Source: Ambulatory Visit | Attending: Radiation Oncology | Admitting: Radiation Oncology

## 2018-08-16 ENCOUNTER — Encounter: Payer: Self-pay | Admitting: *Deleted

## 2018-08-16 ENCOUNTER — Ambulatory Visit (INDEPENDENT_AMBULATORY_CARE_PROVIDER_SITE_OTHER): Payer: PPO | Admitting: Cardiothoracic Surgery

## 2018-08-16 ENCOUNTER — Other Ambulatory Visit: Payer: Self-pay | Admitting: *Deleted

## 2018-08-16 VITALS — BP 120/62 | HR 91 | Temp 98.2°F | Wt 98.4 lb

## 2018-08-16 DIAGNOSIS — C3432 Malignant neoplasm of lower lobe, left bronchus or lung: Secondary | ICD-10-CM | POA: Diagnosis not present

## 2018-08-16 DIAGNOSIS — J432 Centrilobular emphysema: Secondary | ICD-10-CM | POA: Diagnosis not present

## 2018-08-16 DIAGNOSIS — R918 Other nonspecific abnormal finding of lung field: Secondary | ICD-10-CM

## 2018-08-16 DIAGNOSIS — C3431 Malignant neoplasm of lower lobe, right bronchus or lung: Secondary | ICD-10-CM

## 2018-08-16 DIAGNOSIS — F17210 Nicotine dependence, cigarettes, uncomplicated: Secondary | ICD-10-CM | POA: Diagnosis not present

## 2018-08-16 NOTE — Progress Notes (Signed)
Radiation Oncology         [phone] ________________________________  Multidisciplinary Thoracic Oncology Clinic [facility]) Initial Outpatient Consultation  Name: Lindsey Rowland MRN: [id number]  Date: 08/16/2018  DOB: [birth year]  [place]:[id number], Doretha Sou, MD  Collene Gobble, MD   REFERRING PHYSICIAN: Collene Gobble, MD  DIAGNOSIS: Non-Small Cell Lung Cancer, Clinlcal Stage I-II    HISTORY OF PRESENT ILLNESS::Lindsey Rowland is a 67 y.o. female who is here to discuss lung cancer treatment options. She is followed by Dr. Lamonte Sakai in pulmonology for smoking. On lung screening CT chest scan performed on 12/13/17, she was noted to have two patchy/nodular lesions in the right lower lobe. PET scan on 03/22/18 revealed two adjacent hypermetabolic pulmonary nodules in posterior right lower lobe, highly suspicious for bronchogenic carcinoma, and no evidence of local or distant metastatic disease. She was referred to Dr. Lamonte Sakai on 04/16/18. CT super D chest scan on 06/14/18 showed reticulonodular complex in the right lower lobe which was hypermetabolic on comparison PET-CT scan, differential includes neoplastic versus infectious or inflammatory processes. Transbronchial needle aspiration on 08/01/18 showed non-small cell carcinoma in 1 of 3 right lower lobe brushings and atypical cells in a different brushing.  The patient was referred today for presentation in the multidisciplinary conference.  Radiology studies and pathology slides were presented there for review and discussion of treatment options.  A consensus was discussed regarding potential next steps.  She notes that her husband is disabled, so she takes care of him and does house and yard work. She denies headaches. She stays active gardening.  PREVIOUS RADIATION THERAPY: No  PAST MEDICAL HISTORY:  has a past medical history of Arthritis, Cancer ([place]), COPD (chronic obstructive pulmonary disease) ([place]), Headache(784.0), Osteopenia, Polio, and  Ulcer.    PAST SURGICAL HISTORY: Past Surgical History:  Procedure Laterality Date  . APPENDECTOMY    . CATARACT EXTRACTION     right  . DILATION AND CURETTAGE OF UTERUS    . FOOT SURGERY Bilateral   . hip si joint     injection/ablation  . RETINAL DETACHMENT SURGERY  12/2010   right eye  . TONSILLECTOMY    . VIDEO BRONCHOSCOPY WITH ENDOBRONCHIAL NAVIGATION Right 08/01/2018   Procedure: VIDEO BRONCHOSCOPY WITH ENDOBRONCHIAL NAVIGATION Fiducial Placement;  Surgeon: Collene Gobble, MD;  Location: [facility] OR;  Service: Thoracic;  Laterality: Right;    FAMILY HISTORY: family history includes Hiatal hernia in her father.  SOCIAL HISTORY:  reports that she has been smoking cigarettes. She has a 20.00 pack-year smoking history. She has never used smokeless tobacco. She reports that she drinks about 14.0 standard drinks of alcohol per week. She reports that she does not use drugs.  ALLERGIES: Amoxicillin  MEDICATIONS:  Current Outpatient Medications  Medication Sig Dispense Refill  . albuterol (PROAIR HFA) 108 (90 Base) MCG/ACT inhaler Inhale 2 puffs into the lungs every 6 (six) hours as needed for wheezing or shortness of breath. For shortness of breath.    . Aspirin-Salicylamide-Caffeine (BC HEADACHE POWDER PO) Take 1 packet by mouth daily as needed (for pain or headache).     . celecoxib (CELEBREX) 400 MG capsule Take 400 mg by mouth daily.   0  . cyclobenzaprine (FLEXERIL) 10 MG tablet Take 1 tablet (10 mg total) by mouth 3 (three) times daily as needed for muscle spasms. 90 tablet 1  . diazepam (VALIUM) 10 MG tablet Take 1 tablet (10 mg total) by mouth at bedtime as needed for sleep.    Marland Kitchen  diphenoxylate-atropine (LOMOTIL) 2.5-0.025 MG tablet Take 1 tablet by mouth 4 (four) times daily as needed for diarrhea or loose stools. 30 tablet 0  . Multiple Vitamin (MULITIVITAMIN WITH MINERALS) TABS Take 1 tablet by mouth daily.    . pantoprazole (PROTONIX) 40 MG tablet Take 1 tablet (40 mg total) by  mouth daily.    Vladimir Faster Glycol-Propyl Glycol (SYSTANE OP) Place 1 drop into both eyes daily as needed (for dry eyes).    Marland Kitchen rOPINIRole (REQUIP) 0.5 MG tablet Take 1 tablet (0.5 mg total) by mouth at bedtime as needed (for restless leg).    . Tiotropium Bromide Monohydrate (SPIRIVA RESPIMAT) 2.5 MCG/ACT AERS Inhale 2 puffs into the lungs daily. 3 Inhaler 1  . tiZANidine (ZANAFLEX) 4 MG tablet Take 4 mg by mouth 2 (two) times daily as needed for muscle spasms.     No current facility-administered medications for this encounter.     REVIEW OF SYSTEMS:  REVIEW OF SYSTEMS: A 10+ POINT REVIEW OF SYSTEMS WAS OBTAINED including neurology, dermatology, psychiatry, cardiac, respiratory, lymph, extremities, GI, GU, musculoskeletal, constitutional, reproductive, HEENT. All pertinent positives are noted in the HPI. All others are negative.  PHYSICAL EXAM:  Vitals with BMI 08/16/2018  Height   Weight 98 lbs 6 oz  BMI 09.47  Systolic 096  Diastolic 62  Pulse 91  Respirations    General: Alert and oriented, in no acute distress HEENT: Head is normocephalic. Extraocular movements are intact. Oropharynx is clear. Dentures in place. Neck: Neck is supple, no palpable cervical or supraclavicular lymphadenopathy. Heart: Regular in rate and rhythm with no murmurs, rubs, or gallops. Chest: Clear to auscultation bilaterally, with no rhonchi, wheezes, or rales. Abdomen: Soft, nontender, nondistended, with no rigidity or guarding. Extremities: No cyanosis or edema. Lymphatics: see Neck Exam Skin: No concerning lesions. Musculoskeletal: symmetric strength and muscle tone throughout. Neurologic: Cranial nerves II through XII are grossly intact. No obvious focalities. Speech is fluent. Coordination is intact. Psychiatric: Judgment and insight are intact. Affect is appropriate.  KPS = 90  100 - Normal; no complaints; no evidence of disease. 90   - Able to carry on normal activity; minor signs or symptoms of  disease. 80   - Normal activity with effort; some signs or symptoms of disease. 49   - Cares for self; unable to carry on normal activity or to do active work. 60   - Requires occasional assistance, but is able to care for most of his personal needs. 50   - Requires considerable assistance and frequent medical care. 61   - Disabled; requires special care and assistance. 69   - Severely disabled; hospital admission is indicated although death not imminent. 57   - Very sick; hospital admission necessary; active supportive treatment necessary. 10   - Moribund; fatal processes progressing rapidly. 0     - Dead  Karnofsky DA, Abelmann [place], Craver LS and Burchenal [facility] [phone]) The use of the nitrogen mustards in the palliative treatment of carcinoma: with particular reference to bronchogenic carcinoma Cancer 1 634-56  LABORATORY DATA:  Lab Results  Component Value Date   WBC 6.2 07/30/2018   HGB 14.7 07/30/2018   HCT 44.8 07/30/2018   MCV 103.0 (H) 07/30/2018   PLT 267 07/30/2018   Lab Results  Component Value Date   NA 143 07/30/2018   K 3.6 07/30/2018   CL 106 07/30/2018   CO2 27 07/30/2018   Lab Results  Component Value Date   ALT 16 07/30/2018  AST 25 07/30/2018   ALKPHOS 74 07/30/2018   BILITOT 0.4 07/30/2018    PULMONARY FUNCTION TEST:   Recent Review Flowsheet Data    Spirometry Latest Ref Rng & Units 03/20/2018   FVC-%PRED-PRE % 93   FVC-%PRED-POST % 102   FEV1-PRE L 1.63   FEV1-%PRED-PRE % 69   FEV1-POST L 1.78   FEV1-%PRED-POST % 76   DLCO [facility] ml/min/mmHg 9.52      RADIOGRAPHY: Dg Chest Port 1 View  Result Date: 08/01/2018 CLINICAL DATA:  67 year old female status post bronchoscopy and bronchoscopic biopsy. Hypermetabolic posterior right lower lobe pulmonary nodules on PET-CT earlier this year. EXAM: PORTABLE CHEST 1 VIEW COMPARISON:  Chest CT 06/14/2018. PET-CT 03/22/2018. Chest radiographs 11/14/2017. FINDINGS: Portable AP semi upright view at 1021 hours. Chronic  large lung volumes with emphysema. Mildly overpenetrated lungs, but no pneumothorax or pleural effusion identified. There are 3 new small metallic clips projecting at the right lung base. Associated mild hazy opacity in that region, possibly BAL related in this clinical setting. Stable lungs elsewhere. Mediastinal contours remain normal. Visualized tracheal air column is within normal limits. No acute osseous abnormality identified. IMPRESSION: 1. Mild post bronchoscopic and biopsy changes at the right lung base with no pneumothorax or adverse features. 2. Underlying chronic lung disease with emphysema. Electronically Signed   By: Genevie Ann M.D.   On: 08/01/2018 11:42   Dg C-arm Bronchoscopy  Result Date: 08/01/2018 C-ARM BRONCHOSCOPY: Fluoroscopy was utilized by the requesting physician.  No radiographic interpretation.      IMPRESSION: Non-Small Cell Lung Cancer, Clinlcal Stage I-II  Patient would appear to be a good candidate for a definitive course of radiation therapy, likely with SBRT versus hypofractionated accelerated radiation therapy. Patient is very active with gardening, yard work house work and [employer]. She is also the primary care provider of her disabled husband. The patient does seem to be very fit, but does on recent scans have extensive upper lobe centrilobular emphysema. Patient has had recent pulmonary function studies and cardiopulmonary evaluation. She will see Dr. Servando Snare later today to see if she would be a potential candidate for surgery. If she is not a surgical candidate, then she will proceed with definitive radiation therapy for treatment.   PLAN: pending cardiothoracic surgery input     ------------------------------------------------  Blair Promise, PhD, MD  This document serves as a record of services personally performed by Gery Pray, MD. It was created on his behalf by Wilburn Mylar, a trained medical scribe. The creation of this record is based  on the scribe's personal observations and the provider's statements to them. This document has been checked and approved by the attending provider.

## 2018-08-16 NOTE — Progress Notes (Signed)
[address]       [place] [address]             [phone]                    Lindsey Rowland Medical Record #[id number] Date of Birth: 18-Jan-1951  Referring: Collene Gobble, MD Primary Care: Marletta Lor, MD Primary Cardiologist: No primary care provider on file.  Chief Complaint:   No chief complaint on file.   History of Present Illness:    Lindsey Rowland 67 y.o. female is seen in the office  today for evaluation of right lower lobe lung lesion.  Patient has been a long-term smoker.  Had noticed some 6 to 8 pound weight loss over the past 3 months.  Her daughter notes that over the past couple of years she is lost 15 pounds.  She is been followed in the pulmonology clinic, initially with lung cancer screening and subsequently was follow-up PET scan in May 2019 and super D scan several weeks ago.  PFTs and CPX testing has been done.  Patient does remain relatively active.  Navigation bronchoscopy with placement of fiducials was performed by Dr. Lamonte Sakai.  Brushings of the right lower lobe revealed non-small cell carcinoma of the lung.    Current Activity/ Functional Status:  Patient is independent with mobility/ambulation, transfers, ADL's, IADL's.   Zubrod Score: At the time of surgery this patient's most appropriate activity status/level should be described as: _0     0    Normal activity, no symptoms _1     1    Restricted in physical strenuous activity but ambulatory, able to do out light work _2     2    Ambulatory and capable of self care, unable to do work activities, up and about               >50 % of waking hours                              _3     3    Only limited self care, in bed greater than 50% of waking hours _4     4    Completely disabled, no self care, confined to bed or chair _5     5    Moribund   Past Medical History:  Diagnosis Date  . Arthritis   . Cancer [facility])    "pre-cervical" cells  - age 76  . COPD  (chronic obstructive pulmonary disease) ([place])   . Headache(784.0)   . Osteopenia   . Polio    as a child  . Ulcer    stomach hx    Past Surgical History:  Procedure Laterality Date  . APPENDECTOMY    . CATARACT EXTRACTION     right  . DILATION AND CURETTAGE OF UTERUS    . FOOT SURGERY Bilateral   . hip si joint     injection/ablation  . RETINAL DETACHMENT SURGERY  12/2010   right eye  . TONSILLECTOMY    . VIDEO BRONCHOSCOPY WITH ENDOBRONCHIAL NAVIGATION Right 08/01/2018   Procedure: VIDEO BRONCHOSCOPY WITH ENDOBRONCHIAL NAVIGATION Fiducial Placement;  Surgeon: Collene Gobble, MD;  Location: [facility] OR;  Service: Thoracic;  Laterality: Right;    Family History  Problem Relation Age of Onset  . Hiatal hernia Father   . Colon cancer Neg Hx   .  Esophageal cancer Neg Hx   . Stomach cancer Neg Hx   . Rectal cancer Neg Hx      Social History   Tobacco Use  Smoking Status Current Every Day Smoker  . Packs/day: 0.50  . Years: 40.00  . Pack years: 20.00  . Types: Cigarettes  Smokeless Tobacco Never Used    Social History   Substance and Sexual Activity  Alcohol Use Yes  . Alcohol/week: 14.0 standard drinks  . Types: 14 Cans of beer per week     Allergies  Allergen Reactions  . Amoxicillin Diarrhea and Itching    Current Outpatient Medications  Medication Sig Dispense Refill  . albuterol (PROAIR HFA) 108 (90 Base) MCG/ACT inhaler Inhale 2 puffs into the lungs every 6 (six) hours as needed for wheezing or shortness of breath. For shortness of breath.    . Aspirin-Salicylamide-Caffeine (BC HEADACHE POWDER PO) Take 1 packet by mouth daily as needed (for pain or headache).     . celecoxib (CELEBREX) 400 MG capsule Take 400 mg by mouth daily.   0  . cyclobenzaprine (FLEXERIL) 10 MG tablet Take 1 tablet (10 mg total) by mouth 3 (three) times daily as needed for muscle spasms. 90 tablet 1  . diazepam (VALIUM) 10 MG tablet Take 1 tablet (10 mg total) by mouth at bedtime as  needed for sleep.    . diphenoxylate-atropine (LOMOTIL) 2.5-0.025 MG tablet Take 1 tablet by mouth 4 (four) times daily as needed for diarrhea or loose stools. 30 tablet 0  . Multiple Vitamin (MULITIVITAMIN WITH MINERALS) TABS Take 1 tablet by mouth daily.    . pantoprazole (PROTONIX) 40 MG tablet Take 1 tablet (40 mg total) by mouth daily.    Vladimir Faster Glycol-Propyl Glycol (SYSTANE OP) Place 1 drop into both eyes daily as needed (for dry eyes).    Marland Kitchen rOPINIRole (REQUIP) 0.5 MG tablet Take 1 tablet (0.5 mg total) by mouth at bedtime as needed (for restless leg).    . Tiotropium Bromide Monohydrate (SPIRIVA RESPIMAT) 2.5 MCG/ACT AERS Inhale 2 puffs into the lungs daily. 3 Inhaler 1  . tiZANidine (ZANAFLEX) 4 MG tablet Take 4 mg by mouth 2 (two) times daily as needed for muscle spasms.     No current facility-administered medications for this visit.     Pertinent items are noted in HPI.   Review of Systems:     Cardiac Review of Systems: [Y] = yes  or   [ N ] = no   Chest Pain [ n   ]  Resting SOB [ n  ] Exertional SOB  Blue.Reese  ]  Orthopnea Florencio.Farrier  ]   Pedal Edema [ n  ]    Palpitations [  n] Syncope  [n  ]   Presyncope [  y ]   General Review of Systems: [Y] = yes [  ]=no Constitional: recent weight change [ y ];  Wt loss over the last 3 months [ 8  ] anorexia [  ]; fatigue Blue.Reese  ]; nausea [  ]; night sweats [  ]; fever [  ]; or chills [  ];           Eye : blurred vision [  ]; diplopia [   ]; vision changes [  ];  Amaurosis fugax[  ]; Resp: cough [  ];  wheezing[  ];  hemoptysis[  ]; shortness of breath[  ]; paroxysmal nocturnal dyspnea[  ]; dyspnea on exertion[  ]; or  orthopnea[  ];  GI:  gallstones[  ], vomiting[  ];  dysphagia[  ]; melena[  ];  hematochezia [  ]; heartburn[  ];   Hx of  Colonoscopy[  ]; GU: kidney stones [  ]; hematuria[  ];   dysuria [  ];  nocturia[  ];  history of     obstruction [  ]; urinary frequency [  ]             Skin: rash, swelling[  ];, hair loss[  ];  peripheral  edema[  ];  or itching[  ]; Musculosketetal: myalgias[  ];  joint swelling[  ];  joint erythema[  ];  joint pain[  ];  back pain[  ];  Heme/Lymph: bruising[  ];  bleeding[  ];  anemia[  ];  Neuro: TIA[  ];  headaches[  ];  stroke[  ];  vertigo[  ];  seizures[  ];   paresthesias[  ];  difficulty walking[  ];  Psych:depression[  ]; anxiety[  ];  Endocrine: diabetes[  ];  thyroid dysfunction[  ];  Immunizations: Flu up to date Blue.Reese ]; Pneumococcal up to date Blue.Reese  ];  Other:    PHYSICAL EXAMINATION: Vitals with BMI 08/16/2018  Height   Weight 98 lbs 6 oz  BMI 27.06  Systolic 237  Diastolic 62  Pulse 91  Respirations      General appearance: alert, cooperative, appears older than stated age, cachectic and no distress Head: Normocephalic, without obvious abnormality, atraumatic Neck: no adenopathy, no carotid bruit, no JVD, supple, symmetrical, trachea midline and thyroid not enlarged, symmetric, no tenderness/mass/nodules Lymph nodes: Cervical, supraclavicular, and axillary nodes normal. Resp: clear to auscultation bilaterally Cardio: regular rate and rhythm, S1, S2 normal, no murmur, click, rub or gallop GI: soft, non-tender; bowel sounds normal; no masses,  no organomegaly Extremities: extremities normal, atraumatic, no cyanosis or edema and Homans sign is negative, no sign of DVT Neurologic: Grossly normal  Diagnostic Studies & Laboratory data:     Recent Radiology Findings:   Dg Chest Port 1 View  Result Date: 08/01/2018 CLINICAL DATA:  67 year old female status post bronchoscopy and bronchoscopic biopsy. Hypermetabolic posterior right lower lobe pulmonary nodules on PET-CT earlier this year. EXAM: PORTABLE CHEST 1 VIEW COMPARISON:  Chest CT 06/14/2018. PET-CT 03/22/2018. Chest radiographs 11/14/2017. FINDINGS: Portable AP semi upright view at 1021 hours. Chronic large lung volumes with emphysema. Mildly overpenetrated lungs, but no pneumothorax or pleural effusion identified.  There are 3 new small metallic clips projecting at the right lung base. Associated mild hazy opacity in that region, possibly BAL related in this clinical setting. Stable lungs elsewhere. Mediastinal contours remain normal. Visualized tracheal air column is within normal limits. No acute osseous abnormality identified. IMPRESSION: 1. Mild post bronchoscopic and biopsy changes at the right lung base with no pneumothorax or adverse features. 2. Underlying chronic lung disease with emphysema. Electronically Signed   By: Genevie Ann M.D.   On: 08/01/2018 11:42   Dg C-arm Bronchoscopy  Result Date: 08/01/2018 C-ARM BRONCHOSCOPY: Fluoroscopy was utilized by the requesting physician.  No radiographic interpretation.     I have independently reviewed the above radiology studies  and reviewed the findings with the patient.   Recent Lab Findings: Lab Results  Component Value Date   WBC 6.2 07/30/2018   HGB 14.7 07/30/2018   HCT 44.8 07/30/2018   PLT 267 07/30/2018   GLUCOSE 61 (L) 07/30/2018   CHOL 183 01/22/2018   TRIG 136.0  01/22/2018   HDL 51.60 01/22/2018   LDLDIRECT 146.0 10/07/2013   LDLCALC 104 (H) 01/22/2018   ALT 16 07/30/2018   AST 25 07/30/2018   NA 143 07/30/2018   K 3.6 07/30/2018   CL 106 07/30/2018   CREATININE 0.66 07/30/2018   BUN 12 07/30/2018   CO2 27 07/30/2018   TSH 0.59 07/19/2017   INR 0.97 07/30/2018   CLINICAL DATA:  Suspicious pulmonary nodules in the RIGHT lung base on PET-CT 03/22/2018  EXAM: CT CHEST WITHOUT CONTRAST  TECHNIQUE: Multidetector CT imaging of the chest was performed using thin slice collimation for electromagnetic bronchoscopy planning purposes, without intravenous contrast.  COMPARISON:  PET-CT 03/22/2018  FINDINGS: Cardiovascular: Coronary artery calcification and aortic atherosclerotic calcification.  Mediastinum/Nodes: No axillary supraclavicular adenopathy. No mediastinal hilar adenopathy. No pericardial effusion.  Esophagus normal.  Lungs/Pleura: Extensive centrilobular emphysema in the upper lobes. Cluster reticular solid and sub densities in the RIGHT lower lobe. This complex measures 3.4 cm in craniocaudad dimension (image 60/5) and approximately 2.4 by 1.9 cm in axial dimension (130/3). This nodule complex was hypermetabolic on comparison PET-CT scan. Nodular complex is not significantly changed from CT 03/14/2018.  Upper Abdomen: Limited view of the liver, kidneys, pancreas are unremarkable. Normal adrenal glands. Small lymph nodes in the gastrohepatic ligament or not hypermetabolic comparison PET-CT scan.  Musculoskeletal: No aggressive osseous lesion.  IMPRESSION: 1. Reticulonodular complex in the RIGHT lower lobe which was hypermetabolic on comparison PET-CT scan. Differential includes neoplastic versus infectious or inflammatory processes. 2. Extensive upper lobe centrilobular emphysema.   Electronically Signed   By: Suzy Bouchard M.D.   On: 06/14/2018 11:09  CLINICAL DATA:  Initial treatment strategy for enlarging right lower lobe pulmonary nodule. Smoker. Emphysema.  EXAM: NUCLEAR MEDICINE PET SKULL BASE TO THIGH  TECHNIQUE: 5.1 mCi F-18 FDG was injected intravenously. Full-ring PET imaging was performed from the skull base to thigh after the radiotracer. CT data was obtained and used for attenuation correction and anatomic localization.  Fasting blood glucose: 83 mg/dl  COMPARISON:  Chest CT on 03/14/2018  FINDINGS: Mediastinal blood pool activity: SUV max = 1.6  NECK:  No hypermetabolic lymph nodes or masses.  Incidental CT findings:  None.  CHEST: No hypermetabolic lymphadenopathy. Two adjacent pulmonary nodules are seen in the posterior right lower lobe. 11 mm nodule on image 58/8 is mostly solid, and adjacent sub-solid nodule measures 19 mm on image 65/8. These are hypermetabolic, with SUV max of 2.4. No other suspicious pulmonary nodules  identified. No evidence of pleural effusion.  Incidental CT findings: Moderate centrilobular emphysema.  ABDOMEN/PELVIS: No abnormal hypermetabolic activity within the liver, pancreas, adrenal glands, or spleen. No hypermetabolic lymph nodes in the abdomen or pelvis.  Incidental CT findings:  Aortic atherosclerosis.  SKELETON: No focal hypermetabolic bone lesions to suggest skeletal metastasis.  Incidental CT findings:  None.  IMPRESSION: Two adjacent hypermetabolic pulmonary nodules in posterior right lower lobe, highly suspicious for bronchogenic carcinoma.  No evidence of local or distant metastatic disease.  Moderate emphysema.   Electronically Signed   By: Earle Gell M.D.   On: 03/22/2018 16:15    I have independently reviewed the above radiology studies  and reviewed the findings with the patient.   Diagnosis TRANSBRONCHIAL NEEDLE ASPIRATION (A) NAVIGATION RIGHT LOWER LOBE BRUSHINGS (SPECIMEN 1 OF 3 COLLECTED 08/01/18) - MALIGNANT CELLS CONSISTENT WITH NON-SMALL CELL CARCINOMA - SEE COMMENT Preliminary Diagnosis DX: NON-SMALL CELL CA ([facility]) Thressa Sheller MD    PFT's Referred for: Risk Stratification (  pre-surgical)  Procedure: This patient underwent staged symptom-limited exercise testing using a individualized bike protocol with expired gas analysis metabolic evaluation during exercise.  Demographics  Age: 78 Ht. (in.) 55 Wt. (lb) 103.6 BMI: 17.2   Predicted Peak VO2: 21.8  Gender: Female Ht (cm) 165.1 Wt. (kg) 47    Results  Pre-Exercise PFTs   FVC 3.39 (111%)     FEV1 1.54 (64%)      FEV1/FVC 45 (57%)      MVV 60 (67%)      Exercise Time:  6:00   Watts: 50  RPE: 18  Reason stopped: patient ended test due to leg fatigue.   Additional symptoms: Dyspnea (7/10)  Resting HR: 66 Peak HR: 134  (61% age predicted max HR)  BP rest: 146/72 BP peak: 196/78  Peak VO2: 17.0 (78% predicted peak VO2)  VE/VCO2 slope:  53  OUES: 0.95  Peak RER: 1.07  Ventilatory Threshold: 14.4 (66% predicted and 85% measured peak VO2)  Peak RR 38  Peak Ventilation: 46.7  VE/MVV: 78%  PETCO2 at peak: 23  O2pulse: 6 (86% predicted O2pulse)   Interpretation  Notes: Patient gave a very good effort despite her leg weakness and pain. Pulse-oximetry remained 95-100% for the duration of exercise. Exercise was performed on a cycle ergometer starting at [facility] and increasing by 10W/min.   ECG: Resting ECG in sinus rhythm. HR response blunted. There were no sustained arrhythmias and no ST-T changes. BP response mildly hypertensive.   PFT: Pre-exercise spirometry demonstrates moderate obstruction. MVV is below normal.   CPX: Exercise Capacity- Exercise testing with gas exchange demonstrates a mildly reduced peak VO2 of 17.0 ml/kg/min (78% of the age/gender/weight matched sedentary norms). The RER of 1.07 indicates a near maximal effort.    Cardiovascular response- The O2pulse (a surrogate for strokevolume) increased with incremental exercise reaching peak at 6 ml/beat (86% predicted). DeltaVO2/Delta WR is normal at 10.4 Indicating no evidence of cardiovascular impairment.  Ventilatory response- The VE/VCO2 slope is markedly elevated and indicates Increased dead space ventilation. The oxygen uptake efficiency slope (OUES) is reduced. The VO2 at the ventilatory threshold was normal at 66% of the predicted peak VO2 and 85% measured PVO2. At peak exercise, the ventilation reached 78% of the measured MVV and breathing reserve was 13 indicating ventilatory reserve was beginning to deplete. PETCO2 was slightly reduced at 23 mmHg during exercise.   Conclusion: Exercise testing with gas exchange demonstrates mild functional impairment when compared to matched sedentary norms. At peak exercise, patient is imited due to COPD with reduced pre-exercise PFTs and elevated VE/VCO2 slope. DeltaVO2/Delta WR is within normal range and  therefore there does not appear to be any cardiovascular limitation.   Test, report and preliminary impression by: Landis Martins, MS, ACSM-RCEP 04/10/2018 2:19 PM  FINALIZED Marshell Garfinkel MD [facility] 04/18/2018, 2:21 PM     Assessment / Plan:   Patient is a thin 67 year old female with long smoking history, evidence of central lobar emphysema bilaterally who presents with 6 non-small cell carcinoma involving the right lower lobe.  Her pulmonary function studies are somewhat limited though she did have adequate maximal oxygen consumption to consider surgical resection, her diffusion capacity was approximately 40%.  I discussed with her and her daughter the options of surgical resection versus stereotactic radiotherapy.  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  She will discuss her options of surgical resection versus stereotactic radiotherapy with her daughter present today and her other daughter a oncology nurse.  She will call back to cancer center on Monday and let us know how she would proceed.  She is also seen by Dr. Sondra Come radiation oncology today and presented at the multidisciplinary thoracic oncology conference this morning.      I  spent 60 minutes with  the patient face to face and greater then 50% of the time was spent in counseling and coordination of care.    Grace Isaac MD      [address] [place] [address] Office [phone]   Beeper [phone]  08/20/2018 2:09 PM

## 2018-08-16 NOTE — Progress Notes (Signed)
[facility] Clinical Social Work  Clinical Social Work met with patient/family at [employer] appointment to offer support and assess for psychosocial needs. Patient's only concern at this time is remaining active.  She cares for her disabled son, manages their property, and often cares for her grandchildren.  She was accompanied by her daughter, Cain Sieve DISTRESS SCREENING 08/16/2018  Distress experienced in past week (1-10) 0  Physical Problem type Pain;Loss of appetitie     Clinical Social Work briefly discussed Clinical Social Work role and [employer] support programs/services.  Clinical Social Work encouraged patient to call with any additional questions or concerns.   Maryjean Morn, MSW, LCSW, OSW-C Clinical Social Worker [facility] [phone]

## 2018-08-20 ENCOUNTER — Telehealth: Payer: Self-pay | Admitting: *Deleted

## 2018-08-20 NOTE — Telephone Encounter (Signed)
Oncology Nurse Navigator Documentation  Oncology Nurse Navigator Flowsheets 08/20/2018  Navigator Location [facility]  Navigator Encounter Type Telephone/I received a call from Ms. Lindsey Rowland.  She would like to go ahead with radiation treatment. I will update Rad Onc team and explained next steps to patient.  She was thankful for the help.   Telephone Outgoing Call  [facility] Date 08/09/2018  Treatment Phase Pre-Tx/Tx Discussion  Barriers/Navigation Needs Education;Coordination of Care  Education Other  Interventions Coordination of Care;Education  Coordination of Care Other  Education Method Verbal  Acuity Level 2  Time Spent with Patient 30

## 2018-08-21 ENCOUNTER — Encounter: Payer: Self-pay | Admitting: Emergency Medicine

## 2018-08-21 ENCOUNTER — Ambulatory Visit (INDEPENDENT_AMBULATORY_CARE_PROVIDER_SITE_OTHER): Payer: PPO | Admitting: Emergency Medicine

## 2018-08-21 ENCOUNTER — Encounter: Payer: Self-pay | Admitting: Radiation Oncology

## 2018-08-21 DIAGNOSIS — J449 Chronic obstructive pulmonary disease, unspecified: Secondary | ICD-10-CM | POA: Diagnosis not present

## 2018-08-21 DIAGNOSIS — Z23 Encounter for immunization: Secondary | ICD-10-CM

## 2018-08-21 DIAGNOSIS — C3491 Malignant neoplasm of unspecified part of right bronchus or lung: Secondary | ICD-10-CM

## 2018-08-21 MED ORDER — DIPHENOXYLATE-ATROPINE 2.5-0.025 MG PO TABS: 1.0000 | ORAL_TABLET | Freq: Four times a day (QID) | ORAL | 0 refills | Status: DC | PRN

## 2018-08-21 NOTE — Assessment & Plan Note (Signed)
She believes that she may have gotten some benefit from the Spiriva.  Difficult to tell as her symptoms were minimal before we initiated it.  She has a full supply and we will continue for now, reassess clinical status in several months.  She has albuterol available to use as needed.  We will give the flu shot today.

## 2018-08-21 NOTE — Patient Instructions (Addendum)
Please continue Spiriva Respimat 2 puffs once daily. Use albuterol 2 puffs up to every 4 hours if needed for shortness of breath, wheezing, chest tightness. Follow with Dr. Sondra Come to initiate radiation therapy as planned. Congratulations on decreasing your cigarettes.  You need to keep working on this.  Agree with using nicotine replacement lozenges. Flu shot today. Follow with Dr Lamonte Sakai in 6 months or sooner if you have any problems

## 2018-08-21 NOTE — Progress Notes (Signed)
Subjective:    Patient ID: Lindsey Rowland, female    DOB: 1951-05-22, 67 y.o.   MRN: [id number]  HPI 67 year old smoker (30 pack years) with a history of COPD.  She underwent a screening CT scan of the chest in January 2019 that is notified to predominant right lower lobe nodules.  A repeat scan in April 2019 showed some increase in the solid component of the largest right lower lobe nodule.  This prompted a PET scan that was done on 03/22/2018 that I have reviewed.  There was evidence for hypermetabolism and both the predominant right lower lobe nodules that were suspicious for malignancy.  There was no evidence for any hypermetabolic mediastinal nodes. She underwent pulmonary function testing on 03/20/2018 that I reviewed.  This shows moderate to moderately severe obstruction without a bronchodilator response, significant hyperinflation and a severely decreased diffusion capacity.  A cardiopulmonary exercise test from 04/10/2018 was reviewed this showed mild functional impairment that was limited due to an elevated VE/VCO2 slope, principally consistent with a pulmonary limitation. She is here today to discuss her abnormal imaging, plan next steps.   She has felt pretty well. She has lost some wt. About 18 lbs over the last year. She is an active person without any dyspnea. Minimal cough, except with allergies, URI etc. She has albuterol available, uses it about 2-3x a month. She is smoking about 4-5 cig a day.   ROV 07/17/18 --follow-up visit for patient with history of tobacco use, COPD and right lower lobe pulmonary nodules with some hypermetabolism noted on PET scan 03/22/2018. We decided to repeat her scan after 22-month interval to see if there was any increase in size or change in composition. Scan was done on 06/14/2018 that I reviewed. There has been no significant change in the cluster of right lower lobe nodules. Remains suspicious for malignancy. She reports that she spiriva is helping her  some - still gets sob out it the heat. She is smoking about 10 cig a week. She uses albuterol rarely. No flares since last time. with history of tobacco use, COPD and right lower lobe pulmonary nodules with some hypermetabolism noted on PET scan 03/22/2018.  We decided to repeat her scan after 22-month interval to see if there was any increase in size or change in composition.  Scan was done on 06/14/2018 that I reviewed.  There has been no significant change in the cluster of right lower lobe nodules.  Remains suspicious for malignancy.  She reports that she spiriva is helping her  some - still gets sob out it the heat. She is smoking about 10 cig a week. She uses albuterol rarely. No flares since last time.   ROV 08/21/18 --67 year old smoker follows up today for her COPD and also her newly diagnosed non-small cell lung cancer.  She has been seen at the multidisciplinary thoracic oncology clinic. She has elected to pursue SBRT, has reviewed the options well with Drs Sondra Come and Servando Snare. We started her on Spiriva, she believes that it is probably helping her, is willing to continue it. She is UTD on PNA shots, needs the flu shot. She is smoking about 7-8 cig a day.     Review of Systems  Past Medical History:  Diagnosis Date  . Arthritis   . Cancer [facility])    "pre-cervical" cells  - age 67  . COPD (chronic obstructive pulmonary disease) ([place])   . Headache(784.0)   . Osteopenia   . Polio    as a child  . Ulcer    stomach hx     Family History  Problem Relation Age of Onset  . Hiatal hernia Father   . Colon cancer Neg Hx   . Esophageal cancer Neg Hx   . Stomach cancer Neg Hx   . Rectal cancer Neg Hx      Social History   Socioeconomic History  . Marital status: Married    Spouse name: Not on file  . Number of children: Not on file  . Years of education: Not on file  . Highest education level: Not  on file  Occupational History  . Not on file  Social Needs  . Financial resource strain: Not on file  . Food insecurity:    Worry: Not on file    Inability: Not on file  . Transportation needs:    Medical: Not on file    Non-medical: Not on file  Tobacco Use  . Smoking status: Current Every Day Smoker    Packs/day: 0.50    Years: 40.00    Pack years: 20.00    Types: Cigarettes  . Smokeless tobacco: Never Used  Substance and Sexual Activity  . Alcohol use: Yes    Alcohol/week: 14.0 standard drinks    Types: 14 Cans of beer per week  . Drug use: No  . Sexual activity: Never  Lifestyle  . Physical activity:    Days per week: Not on file     Minutes per session: Not on file  . Stress: Not on file  Relationships  . Social connections:    Talks on phone: Not on file    Gets together: Not on file    Attends religious service: Not on file    Active member of club or organization: Not on file    Attends meetings of clubs or organizations: Not on file    Relationship status: Not on file  . Intimate partner violence:    Fear of current or ex partner: Not on file    Emotionally abused: Not on file    Physically abused: Not on file    Forced sexual activity: Not on file  Other Topics Concern  . Not on file  Social History Narrative  . 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  No TB exposure.   Allergies  Allergen Reactions  . Amoxicillin Diarrhea and Itching     Outpatient Medications Prior to Visit  Medication Sig Dispense Refill  . albuterol (PROAIR HFA) 108 (90 Base) MCG/ACT inhaler Inhale 2 puffs into the lungs every 6 (six) hours as needed for wheezing or shortness of breath. For shortness of breath.    . Aspirin-Salicylamide-Caffeine (BC HEADACHE POWDER PO) Take 1 packet by mouth daily as needed (for pain or headache).     . celecoxib (CELEBREX) 400 MG capsule Take 400 mg by mouth daily.   0  . cyclobenzaprine (FLEXERIL) 10 MG tablet Take 1 tablet (10 mg total) by mouth 3 (three) times daily as needed for muscle spasms. 90 tablet 1  . diazepam (VALIUM) 10 MG tablet Take 1 tablet (10 mg total) by mouth at bedtime as needed for sleep.    . Multiple Vitamin (MULITIVITAMIN WITH MINERALS) TABS Take 1 tablet by mouth daily.    . pantoprazole (PROTONIX) 40 MG tablet Take 1 tablet (40 mg total) by mouth daily.    Vladimir Faster Glycol-Propyl Glycol (SYSTANE OP) Place 1 drop into both eyes daily as needed (for dry eyes).    Marland Kitchen rOPINIRole (REQUIP) 0.5 MG tablet Take 1 tablet (0.5 mg total) by mouth at bedtime as needed (for restless leg).    . Tiotropium Bromide Monohydrate (SPIRIVA  RESPIMAT) 2.5 MCG/ACT AERS Inhale 2 puffs into the lungs daily. 3 Inhaler 1  . tiZANidine (ZANAFLEX) 4 MG tablet Take 4 mg by mouth 2 (two) times daily as needed for muscle spasms.    . diphenoxylate-atropine (LOMOTIL) 2.5-0.025 MG tablet Take 1 tablet by mouth 4 (four) times daily as needed for diarrhea or loose  stools. 30 tablet 0   No facility-administered medications prior to visit.          Objective:   Physical Exam Vitals:   08/21/18 0938  BP: 102/64  Pulse: 85  SpO2: 96%  Weight: 97 lb (44 kg)  Height: 5' 4.25" (1.632 m)   Gen: Pleasant, thin, in no distress,  normal affect  ENT: No lesions,  mouth clear,  oropharynx clear, no postnasal drip  Neck: No JVD, no stridor  Lungs: No use of accessory muscles, clear without rales or rhonchi  Cardiovascular: RRR, heart sounds normal, no murmur or gallops, no peripheral edema  Musculoskeletal: No deformities, no cyanosis or clubbing  Neuro: alert, non focal  Skin: Warm, no lesions or rash    CT chest 03/14/18 --  COMPARISON:  Low-dose lung cancer screening chest CT 12/13/2017.  FINDINGS: Cardiovascular: Heart size is normal. There is no significant pericardial fluid, thickening or pericardial calcification. There is aortic atherosclerosis, as well as atherosclerosis of the great vessels of the mediastinum and the coronary arteries, including calcified atherosclerotic plaque in the left main, left anterior descending, left circumflex and right coronary arteries.  Mediastinum/Nodes: No pathologically enlarged mediastinal or hilar lymph nodes. Please note that accurate exclusion of hilar adenopathy is limited on noncontrast CT scans. Esophagus is unremarkable in appearance. No axillary lymphadenopathy.  Lungs/Pleura: Multiple pulmonary nodules are again noted in the lungs bilaterally, generally similar to the prior examination, including one of the larger nodules in the right lower lobe. However, the largest  nodule in the right lower lobe which is highly irregular in shape (axial image 254 of series 3), demonstrates a volume derived mean diameter of 13.6 mm on today's examination, significantly increased from 9.9 mm on the prior study. Diffuse bronchial wall thickening with moderate centrilobular and paraseptal emphysema. Scarring in the apex of the lungs bilaterally. No acute consolidative airspace disease. No pleural effusions.  Upper Abdomen: Aortic atherosclerosis.  Musculoskeletal: There are no aggressive appearing lytic or blastic lesions noted in the visualized portions of the skeleton.  IMPRESSION: 1. Lung-RADS 4BS, suspicious. Additional imaging evaluation or consultation with Pulmonology or Thoracic Surgery recommended. 2. The "S" modifier above refers to potentially clinically significant non lung cancer related findings. Specifically, there is aortic atherosclerosis, in addition to left main and 3 vessel coronary artery disease. Please note that although the presence of coronary artery calcium documents the presence of coronary artery disease, the severity of this disease and any potential stenosis cannot be assessed on this non-gated CT examination. Assessment for potential risk factor modification, dietary therapy or pharmacologic therapy may be warranted, if clinically indicated. 3. Mild diffuse bronchial thickening with moderate centrilobular and paraseptal emphysema; imaging findings suggestive of underlying COPD.    PET scan 03/22/18 --  COMPARISON:  Chest CT on 03/14/2018  FINDINGS: Mediastinal blood pool activity: SUV max = 1.6  NECK:  No hypermetabolic lymph nodes or masses.  Incidental CT findings:  None.  CHEST: No hypermetabolic lymphadenopathy. Two adjacent pulmonary nodules are seen in the posterior right lower lobe. 11 mm nodule on image 58/8 is mostly solid, and adjacent sub-solid nodule measures 19 mm on image 65/8. These are hypermetabolic,  with SUV max of 2.4. No other suspicious pulmonary nodules identified. No evidence of pleural effusion.  Incidental CT findings: Moderate centrilobular emphysema.  ABDOMEN/PELVIS: No abnormal hypermetabolic activity within the liver, pancreas, adrenal glands, or spleen. No hypermetabolic lymph nodes in the abdomen or pelvis.  Incidental CT findings:  Aortic atherosclerosis.  SKELETON: No focal hypermetabolic bone lesions to suggest skeletal metastasis.  Incidental CT findings:  None.  IMPRESSION: Two adjacent hypermetabolic pulmonary nodules in posterior right lower lobe, highly suspicious for bronchogenic carcinoma.  No evidence of local or distant metastatic disease.  Moderate emphysema.      Assessment & Plan:  COPD, mild She believes that she may have gotten some benefit from the Spiriva.  Difficult to tell as her symptoms were minimal before we initiated it.  She has a full supply and we will continue for now, reassess clinical status in several months.  She has albuterol available to use as needed.  We will give the flu shot today.  Non-small cell cancer of right lung [facility]) She has discussed the options with both thoracic surgery and radiation oncology.  She decided to pursue stereotactic radiation.  This is being coordinated now.  Baltazar Apo, MD, PhD 08/21/2018, 10:08 AM [facility] [phone] or if no answer [phone]

## 2018-08-21 NOTE — Assessment & Plan Note (Signed)
She has discussed the options with both thoracic surgery and radiation oncology.  She decided to pursue stereotactic radiation.  This is being coordinated now.

## 2018-08-27 NOTE — Progress Notes (Signed)
Thoracic Location of Tumor / Histology:  DIAGNOSIS: Non-Small Cell Lung Cancer, Clinlcal Stage I-II  Patient presented with symptoms of: On lung screening CT chest scan performed on 12/13/17, she was noted to have two patchy/nodular lesions in the right lower lobe. PET scan on 03/22/18 revealed two adjacent hypermetabolic pulmonary nodules in posterior right lower lobe, highly suspicious for bronchogenic carcinoma, and no evidence of local or distant metastatic disease. She was referred to Dr. Lamonte Sakai on 04/16/18. CT super D chest scan on 06/14/18 showed reticulonodular complex in the right lower lobe which was hypermetabolic on comparison PET-CT scan, differential includes neoplastic versus infectious or inflammatory processes. Transbronchial needle aspiration on 08/01/18 showed non-small cell carcinoma in 1 of 3 right lower lobe brushings and atypical cells in a different brushing.  Biopsies revealed: 08/01/18: Diagnosis TRANSBRONCHIAL NEEDLE ASPIRATION (A) NAVIGATION RIGHT LOWER LOBE BRUSHINGS (SPECIMEN 1 OF 3 COLLECTED 08/01/18) - MALIGNANT CELLS CONSISTENT WITH NON-SMALL CELL CARCINOMA - SEE COMMENT Preliminary Diagnosis DX: NON-SMALL CELL CA ([facility])  Tobacco/Marijuana/Snuff/ETOH use:  Tobacco Use  . Smoking status: Current Every Day Smoker    Packs/day: 0.50    Years: 40.00    Pack years: 20.00    Types: Cigarettes  . Smokeless tobacco: Never Used  Substance and Sexual Activity  . Alcohol use: Yes    Alcohol/week: 14.0 standard drinks    Types: 14 Cans of beer per week  . Drug use: No     Past/Anticipated interventions by cardiothoracic surgery, if any: None per CT surgery. Per Pulmonology, 08/01/18:  Operation: Flexible video fiberoptic bronchoscopy with electromagnetic navigation and biopsies Surgeon: Baltazar Apo  Past/Anticipated interventions by medical oncology, if any: None at this time.  Signs/Symptoms  Weight changes, if any: Had noticed some 6 to 8 pound weight loss  over the past 3 months.  Her daughter notes that over the past couple of years she is lost 15 pounds.   Respiratory complaints, if any: coughing today due to yard work yesterday.  Hemoptysis, if any: No  Pain issues, if any:  No  SAFETY ISSUES:  Prior radiation? No  Pacemaker/ICD? No  Possible current pregnancy? No, pt is post-menopausal  Is the patient on methotrexate? No  Current Complaints / other details:  Pt presents today for consult with Dr. Sondra Come for Radiation Oncology. Pt is accompanied by daughter, Crystal.   Loma Sousa, RN BSN

## 2018-08-29 ENCOUNTER — Ambulatory Visit
Admission: RE | Admit: 2018-08-29 | Discharge: 2018-08-29 | Disposition: A | Discharge status: Home / Self Care | Payer: PPO | Source: Ambulatory Visit | Attending: Radiation Oncology | Admitting: Radiation Oncology

## 2018-08-29 ENCOUNTER — Encounter: Payer: Self-pay | Admitting: Radiation Oncology

## 2018-08-29 ENCOUNTER — Other Ambulatory Visit: Payer: Self-pay

## 2018-08-29 ENCOUNTER — Telehealth: Payer: Self-pay

## 2018-08-29 VITALS — BP 108/60 | HR 78 | Temp 98.2°F | Resp 20 | Ht 64.25 in | Wt 100.6 lb

## 2018-08-29 DIAGNOSIS — Z880 Allergy status to penicillin: Secondary | ICD-10-CM | POA: Diagnosis not present

## 2018-08-29 DIAGNOSIS — C3490 Malignant neoplasm of unspecified part of unspecified bronchus or lung: Secondary | ICD-10-CM | POA: Diagnosis not present

## 2018-08-29 DIAGNOSIS — Z79899 Other long term (current) drug therapy: Secondary | ICD-10-CM | POA: Insufficient documentation

## 2018-08-29 DIAGNOSIS — C3491 Malignant neoplasm of unspecified part of right bronchus or lung: Secondary | ICD-10-CM

## 2018-08-29 DIAGNOSIS — C3431 Malignant neoplasm of lower lobe, right bronchus or lung: Secondary | ICD-10-CM | POA: Diagnosis not present

## 2018-08-29 NOTE — Telephone Encounter (Signed)
Pt's daughter Stanford Breed from [facility] calling, states she is an Product manager and is "very upset because my mom doesn't know what is going on there". Daughter states that pt was confused that Threasa Beards in scheduling stated she was to get a letter in the mail but the pt did not get the letter until she returned home from appt today. Daughter continued to state "my mom is very upset" repeatedly. Attempted to converse with daughter to inquire what other topics pt was confused by but was unable. Daughter stated that "I'm a nurse and I guess I should have just had her come down here for treatment". Daughter continued, "Is she going to get brachy therapy or SBRT? Do you even know what kind of treatment she is going to get? Or when she is going to start? My mom said that she was going to get a CT in 3-6 months. How is Dr. Sondra Come even going to know if the radiation worked? Mom said she is going to get a CT and then not start treatment until the end of October".  Reassured daughter that the time between CT simulation and initiation of radiation treatment would be approximately 5-7 days. Conveyed to daughter that it was standard practice to allow radiation to reach maximum benefits before having pt undergo a surveillance scan. Conveyed to daughter that pt would have a CT simulation appt on Monday at 0800 and that Dr. Sondra Come would be involved in the marking of pt's landmarks. Daughter stated she would be at appt with pt and has questions for Dr. Sondra Come. Daughter again stated that she was upset that pt did not receive letter until after pt returned home. Attempted to reassure daughter that the letter had no further instructions for upcoming appts and this RN apologized for the delay in letter and also for pt's confusion/experience this am. Daughter replied, "Oh thank you so much for answering my questions!".  Daughter ended call. Loma Sousa, RN BSN

## 2018-08-29 NOTE — Progress Notes (Signed)
Radiation Oncology         [phone] ________________________________  Name: Lindsey Rowland MRN: [id number]  Date: 08/29/2018  DOB: March 24, 1951  Re-Evaluation Visit Note  CC: Marletta Lor, MD  Collene Gobble, MD    ICD-10-CM   1. Non-small cell cancer of right lung [facility]) C34.91     Diagnosis:   Non-Small cell lung cancer, clinical stage I-II  Narrative:  The patient returns today for re-evaluation. She was initially seen in [place].  She is doing well overall. They are accompanied by her daughter today. 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 She reports associated lower back pain with laying flat for extended periods of time. She denies any other symptoms. Patient did meet with thoracic surgery during the multidisciplinary lung clinic. After careful consideration she has decided to proceed with SBRT for definitive treatment of her right lower lung cancer.                 ALLERGIES:  is allergic to amoxicillin.  Meds: Current Outpatient Medications  Medication Sig Dispense Refill  . albuterol (PROAIR HFA) 108 (90 Base) MCG/ACT inhaler Inhale 2 puffs into the lungs every 6 (six) hours as needed for wheezing or shortness of breath. For shortness of breath.    . Aspirin-Salicylamide-Caffeine (BC HEADACHE POWDER PO) Take 1 packet by mouth daily as needed (for pain or headache).     . celecoxib (CELEBREX) 400 MG capsule Take 400 mg by mouth daily.   0  . cyclobenzaprine (FLEXERIL) 10 MG tablet Take 1 tablet (10 mg total) by mouth 3 (three) times daily as needed for muscle spasms. 90 tablet 1  . diazepam (VALIUM) 10 MG tablet Take 1 tablet (10 mg total) by mouth at bedtime as needed for sleep.    . diphenoxylate-atropine (LOMOTIL) 2.5-0.025 MG tablet Take 1 tablet by mouth 4 (four) times daily as needed for diarrhea or loose stools. 30 tablet 0  . Multiple Vitamin (MULITIVITAMIN WITH MINERALS) TABS Take 1 tablet by mouth daily.    . pantoprazole (PROTONIX)  40 MG tablet Take 1 tablet (40 mg total) by mouth daily.    Vladimir Faster Glycol-Propyl Glycol (SYSTANE OP) Place 1 drop into both eyes daily as needed (for dry eyes).    Marland Kitchen rOPINIRole (REQUIP) 0.5 MG tablet Take 1 tablet (0.5 mg total) by mouth at bedtime as needed (for restless leg).    . Tiotropium Bromide Monohydrate (SPIRIVA RESPIMAT) 2.5 MCG/ACT AERS Inhale 2 puffs into the lungs daily. 3 Inhaler 1  . tiZANidine (ZANAFLEX) 4 MG tablet Take 4 mg by mouth 2 (two) times daily as needed for muscle spasms.     No current facility-administered medications for this encounter.     Physical Findings: The patient is in no acute distress. Patient is alert and oriented.  height is 5' 4.25" (1.632 m) and weight is 100 lb 9.6 oz (45.6 kg). Her oral temperature is 98.2 F (36.8 C). Her blood pressure is 108/60 and her pulse is 78. Her respiration is 20 and oxygen saturation is 99%.   Lungs are clear to auscultation bilaterally. Heart has regular rate and rhythm. No palpable cervical, supraclavicular, or axillary adenopathy. Abdomen soft, non-tender, normal bowel sounds.   Lab Findings: Lab Results  Component Value Date   WBC 6.2 07/30/2018   HGB 14.7 07/30/2018   HCT 44.8 07/30/2018   MCV 103.0 (H) 07/30/2018   PLT 267 07/30/2018    Radiographic Findings:  Dg Chest Port 1 View  Result Date: 08/01/2018 CLINICAL DATA:  67 year old female status post bronchoscopy and bronchoscopic biopsy. Hypermetabolic posterior right lower lobe pulmonary nodules on PET-CT earlier this year. EXAM: PORTABLE CHEST 1 VIEW COMPARISON:  Chest CT 06/14/2018. PET-CT 03/22/2018. Chest radiographs 11/14/2017. FINDINGS: Portable AP semi upright view at 1021 hours. Chronic large lung volumes with emphysema. Mildly overpenetrated lungs, but no pneumothorax or pleural effusion identified. There are 3 new small metallic clips projecting at the right lung base. Associated mild hazy opacity in that region, possibly BAL related in this  clinical setting. Stable lungs elsewhere. Mediastinal contours remain normal. Visualized tracheal air column is within normal limits. No acute osseous abnormality identified. IMPRESSION: 1. Mild post bronchoscopic and biopsy changes at the right lung base with no pneumothorax or adverse features. 2. Underlying chronic lung disease with emphysema. Electronically Signed   By: Genevie Ann M.D.   On: 08/01/2018 11:42   Dg C-arm Bronchoscopy  Result Date: 08/01/2018 C-ARM BRONCHOSCOPY: Fluoroscopy was utilized by the requesting physician.  No radiographic interpretation.    Impression:  Non-Small cell lung cancer, clinical stage I-II  Patient would be a good candidate for SBRT directed at the area within the right lower lung.   Today, I talked to the patient and daughter about the findings and work-up thus far.  We discussed the natural history of non small cell lung cancer and general treatment, highlighting the role of radiotherapy in the management.  We discussed the available radiation techniques, and focused on the details of logistics and delivery.  We reviewed the anticipated acute and late sequelae associated with radiation in this setting.  The patient was encouraged to ask questions that I answered to the best of my ability.  A patient consent form was discussed and signed.  We retained a copy for our records.  The patient would like to proceed with radiation and will be scheduled for CT simulation.    Plan:  CT simulation on 09/03/2018 at 8 AM. Anticipate between 3 and 5 SBRT treatments.  ____________________________________   Blair Promise, PhD, MD    This document serves as a record of services personally performed by Gery Pray, MD. It was created on his behalf by [facility], a trained medical scribe. The creation of this record is based on the scribe's personal observations and the provider's statements to them. This document has been checked and approved by the attending  provider.

## 2018-09-02 NOTE — Progress Notes (Signed)
  Radiation Oncology         [phone] ________________________________  Name: Lindsey Rowland MRN: [id number]  Date: 09/03/2018  DOB: 18-Feb-1951   STEREOTACTIC BODY RADIOTHERAPY SIMULATION AND TREATMENT PLANNING NOTE    DIAGNOSIS:  Non-Small cell lung cancer, clinical stage I-II  NARRATIVE:  The patient was brought to the [place] suite.  Identity was confirmed.  All relevant records and images related to the planned course of therapy were reviewed.  The patient freely provided informed written consent to proceed with treatment after reviewing the details related to the planned course of therapy. The consent form was witnessed and verified by the simulation staff.  Then, the patient was set-up in a stable reproducible  supine position for radiation therapy.  A BodyFix immobilization pillow was fabricated for reproducible positioning.  Then I personally applied the abdominal compression paddle to limit respiratory excursion.  4D respiratoy motion management CT images were obtained.  Surface markings were placed.  The CT images were loaded into the planning software.  Then, using Cine, MIP, and standard views, the internal target volume (ITV) and planning target volumes (PTV) were delinieated, and avoidance structures were contoured.  Treatment planning then occurred.  The radiation prescription was entered and confirmed.  A total of two complex treatment devices were fabricated in the form of the BodyFix immobilization pillow and a neck accuform cushion.  I have requested : 3D Simulation  I have requested a DVH of the following structures: Heart, Lungs, Esophagus, Chest Wall, Brachial Plexus, Major Blood Vessels, and targets.  PLAN:  The patient will receive 54 Gy in 3 fractions.  -----------------------------------  Blair Promise, PhD, MD  This document serves as a record of services personally performed by Gery Pray, MD. It was created on his behalf by [facility], a  trained medical scribe. The creation of this record is based on the scribe's personal observations and the provider's statements to them. This document has been checked and approved by the attending provider.

## 2018-09-03 ENCOUNTER — Ambulatory Visit
Admission: RE | Admit: 2018-09-03 | Discharge: 2018-09-03 | Disposition: A | Discharge status: Home / Self Care | Payer: PPO | Source: Ambulatory Visit | Attending: Radiation Oncology | Admitting: Radiation Oncology

## 2018-09-03 DIAGNOSIS — C3491 Malignant neoplasm of unspecified part of right bronchus or lung: Secondary | ICD-10-CM

## 2018-09-03 DIAGNOSIS — C3431 Malignant neoplasm of lower lobe, right bronchus or lung: Secondary | ICD-10-CM | POA: Diagnosis not present

## 2018-09-03 DIAGNOSIS — Z510 Encounter for antineoplastic radiation therapy: Secondary | ICD-10-CM | POA: Diagnosis not present

## 2018-09-04 DIAGNOSIS — C3431 Malignant neoplasm of lower lobe, right bronchus or lung: Secondary | ICD-10-CM | POA: Diagnosis not present

## 2018-09-04 DIAGNOSIS — Z510 Encounter for antineoplastic radiation therapy: Secondary | ICD-10-CM | POA: Insufficient documentation

## 2018-09-10 ENCOUNTER — Ambulatory Visit
Admission: RE | Admit: 2018-09-10 | Discharge: 2018-09-10 | Disposition: A | Discharge status: Home / Self Care | Payer: PPO | Source: Ambulatory Visit | Attending: Radiation Oncology | Admitting: Radiation Oncology

## 2018-09-10 DIAGNOSIS — Z510 Encounter for antineoplastic radiation therapy: Secondary | ICD-10-CM | POA: Diagnosis not present

## 2018-09-12 ENCOUNTER — Ambulatory Visit
Admission: RE | Admit: 2018-09-12 | Discharge: 2018-09-12 | Disposition: A | Discharge status: Home / Self Care | Payer: PPO | Source: Ambulatory Visit | Attending: Radiation Oncology | Admitting: Radiation Oncology

## 2018-09-12 DIAGNOSIS — Z510 Encounter for antineoplastic radiation therapy: Secondary | ICD-10-CM | POA: Diagnosis not present

## 2018-09-17 ENCOUNTER — Ambulatory Visit
Admission: RE | Admit: 2018-09-17 | Discharge: 2018-09-17 | Disposition: A | Discharge status: Home / Self Care | Payer: PPO | Source: Ambulatory Visit | Attending: Radiation Oncology | Admitting: Radiation Oncology

## 2018-09-17 DIAGNOSIS — Z510 Encounter for antineoplastic radiation therapy: Secondary | ICD-10-CM | POA: Diagnosis not present

## 2018-09-17 DIAGNOSIS — C3431 Malignant neoplasm of lower lobe, right bronchus or lung: Secondary | ICD-10-CM | POA: Diagnosis not present

## 2018-09-19 ENCOUNTER — Encounter: Payer: Self-pay | Admitting: Radiation Oncology

## 2018-09-19 NOTE — Progress Notes (Signed)
  Radiation Oncology         [phone] ________________________________  Name: Lindsey Rowland MRN: [id number]  Date: 09/19/2018  DOB: 05/28/1951  End of Treatment Note  Diagnosis:  Non-Small cell lung cancer, clinical stage I-II     Indication for treatment:  Curative       Radiation treatment dates:   09/10/18, 09/12/18/ 09/17/18  Site/dose:   Right lung, 18Gy in 3 fractions for a total dose of 54 Gy.  Beams/energy:   SBRT/SRT-VMAT, 6X-FFF  Narrative: The patient tolerated radiation treatment relatively well. At the beginning of treatment, pt reported mild to moderate fatigue, persistent cough x3-4 weeks (chronic, potentially exacerbated or worsened with treatment), weight loss. Pt denied difficulty swallowing   Plan: The patient has completed radiation treatment. The patient will return to radiation oncology clinic for routine followup in one month. I advised them to call or return sooner if they have any questions or concerns related to their recovery or treatment.  -----------------------------------  Blair Promise, PhD, MD  This document serves as a record of services personally performed by Gery Pray, MD. It was created on his behalf by Mary-Margaret Loma Messing, a trained medical scribe. The creation of this record is based on the scribe's personal observations and the provider's statements to them. This document has been checked and approved by the attending provider.

## 2018-10-18 ENCOUNTER — Encounter: Payer: Self-pay | Admitting: Radiation Oncology

## 2018-10-18 ENCOUNTER — Ambulatory Visit
Admission: RE | Admit: 2018-10-18 | Discharge: 2018-10-18 | Disposition: A | Discharge status: Home / Self Care | Payer: PPO | Source: Ambulatory Visit | Attending: Radiation Oncology | Admitting: Radiation Oncology

## 2018-10-18 ENCOUNTER — Other Ambulatory Visit: Payer: Self-pay

## 2018-10-18 VITALS — BP 130/77 | HR 85 | Temp 98.4°F | Resp 20 | Ht 66.0 in | Wt 100.0 lb

## 2018-10-18 DIAGNOSIS — C3491 Malignant neoplasm of unspecified part of right bronchus or lung: Secondary | ICD-10-CM

## 2018-10-18 NOTE — Progress Notes (Signed)
Ms. Commerford is here for a  follow-up appointment today.Patient denies any pain today. Patient reports mild fatigue. Patient reports shortness of breath at rest. Patient reports having a dry cough. Patient states that she is coughing up clear phlegm.P{atient denies any issues with swallowing. Patient denies any issue with her skin at her radiation site.Patient states that her appetite is not good due to  decrease taste. Vitals:   10/18/18 1047  BP: 130/77  Pulse: 85  Resp: 20  Temp: 98.4 F (36.9 C)  TempSrc: Oral  SpO2: 100%  Weight: 100 lb (45.4 kg)  Height: 5\' 6"  (1.676 m)   Wt Readings from Last 3 Encounters:  10/18/18 100 lb (45.4 kg)  08/29/18 100 lb 9.6 oz (45.6 kg)  08/21/18 97 lb (44 kg)

## 2018-10-18 NOTE — Progress Notes (Signed)
Radiation Oncology         [phone] ________________________________  Name: Lindsey Rowland MRN: [id number]  Date: 10/18/2018  DOB: 01-12-1951  Follow-Up Visit Note  CC: Marletta Lor, MD  Collene Gobble, MD    ICD-10-CM   1. Non-small cell cancer of right lung [facility]) C34.91     Diagnosis:   Non-Small cell lung cancer, clinical stage I-II   Interval Since Last Radiation:  1 month   Radiation treatment dates:   09/10/18, 09/12/18/ 09/17/18  Site/dose:   Right lung, 18Gy in 3 fractions for a total dose of 54 Gy.  Narrative:  The patient returns today for routine follow-up.  she is doing well overall. She notes for Thanksgiving, she will have her family at her home and she will be cooking dinner for them all. She reports associated mildly resolved productive cough (after leaf blowing in the cold weather), fatigue, and resolved rhinorrhea. She denies CP, hemoptysis, and any other symptoms.                                 ALLERGIES:  is allergic to amoxicillin.  Meds: Current Outpatient Medications  Medication Sig Dispense Refill  . albuterol (PROAIR HFA) 108 (90 Base) MCG/ACT inhaler Inhale 2 puffs into the lungs every 6 (six) hours as needed for wheezing or shortness of breath. For shortness of breath.    . Aspirin-Salicylamide-Caffeine (BC HEADACHE POWDER PO) Take 1 packet by mouth daily as needed (for pain or headache).     . celecoxib (CELEBREX) 400 MG capsule Take 400 mg by mouth daily.   0  . cyclobenzaprine (FLEXERIL) 10 MG tablet Take 1 tablet (10 mg total) by mouth 3 (three) times daily as needed for muscle spasms. 90 tablet 1  . diphenoxylate-atropine (LOMOTIL) 2.5-0.025 MG tablet Take 1 tablet by mouth 4 (four) times daily as needed for diarrhea or loose stools. 30 tablet 0  . Multiple Vitamin (MULITIVITAMIN WITH MINERALS) TABS Take 1 tablet by mouth daily.    . pantoprazole (PROTONIX) 40 MG tablet Take 1 tablet (40 mg total) by mouth daily.    Vladimir Faster  Glycol-Propyl Glycol (SYSTANE OP) Place 1 drop into both eyes daily as needed (for dry eyes).    Marland Kitchen rOPINIRole (REQUIP) 0.5 MG tablet Take 1 tablet (0.5 mg total) by mouth at bedtime as needed (for restless leg).    . Tiotropium Bromide Monohydrate (SPIRIVA RESPIMAT) 2.5 MCG/ACT AERS Inhale 2 puffs into the lungs daily. 3 Inhaler 1  . diazepam (VALIUM) 10 MG tablet Take 1 tablet (10 mg total) by mouth at bedtime as needed for sleep. (Patient not taking: Reported on 10/18/2018)    . tiZANidine (ZANAFLEX) 4 MG tablet Take 4 mg by mouth 2 (two) times daily as needed for muscle spasms.     No current facility-administered medications for this encounter.     Physical Findings: The patient is in no acute distress. Patient is alert and oriented.  height is 5\' 6"  (1.676 m) and weight is 100 lb (45.4 kg). Her oral temperature is 98.4 F (36.9 C). Her blood pressure is 130/77 and her pulse is 85. Her respiration is 20 and oxygen saturation is 100%.   Lungs are clear to auscultation bilaterally. Heart has regular rate and rhythm. No palpable cervical, supraclavicular, or axillary adenopathy. Abdomen soft, non-tender, normal bowel sounds.   Lab Findings: Lab Results  Component Value Date  WBC 6.2 07/30/2018   HGB 14.7 07/30/2018   HCT 44.8 07/30/2018   MCV 103.0 (H) 07/30/2018   PLT 267 07/30/2018    Radiographic Findings: No results found.  Impression:  Clinically stable 1 month out from SBRT, possibly some mild fatigue, but no other symptoms.    Plan:  Patient will have a Chest CT scan scheduled in 3 months. Follow up in Radiation Oncology soon after the scan is complete.   ____________________________________   Blair Promise, PhD, MD    This document serves as a record of services personally performed by Gery Pray, MD. It was created on his behalf by [facility], a trained medical scribe. The creation of this record is based on the scribe's personal observations and the  provider's statements to them. This document has been checked and approved by the attending provider.

## 2018-11-27 DIAGNOSIS — R05 Cough: Secondary | ICD-10-CM | POA: Diagnosis not present

## 2018-11-27 DIAGNOSIS — J441 Chronic obstructive pulmonary disease with (acute) exacerbation: Secondary | ICD-10-CM | POA: Diagnosis not present

## 2018-12-06 ENCOUNTER — Telehealth: Payer: Self-pay | Admitting: *Deleted

## 2018-12-06 DIAGNOSIS — H2512 Age-related nuclear cataract, left eye: Secondary | ICD-10-CM | POA: Diagnosis not present

## 2018-12-06 DIAGNOSIS — H04123 Dry eye syndrome of bilateral lacrimal glands: Secondary | ICD-10-CM | POA: Diagnosis not present

## 2018-12-06 DIAGNOSIS — H40013 Open angle with borderline findings, low risk, bilateral: Secondary | ICD-10-CM | POA: Diagnosis not present

## 2018-12-06 DIAGNOSIS — H33051 Total retinal detachment, right eye: Secondary | ICD-10-CM | POA: Diagnosis not present

## 2018-12-06 DIAGNOSIS — H10413 Chronic giant papillary conjunctivitis, bilateral: Secondary | ICD-10-CM | POA: Diagnosis not present

## 2018-12-06 DIAGNOSIS — Z961 Presence of intraocular lens: Secondary | ICD-10-CM | POA: Diagnosis not present

## 2018-12-06 NOTE — Telephone Encounter (Signed)
CALLED PATIEN TO INFORM OF CT FOR 01-18-19 - ARRIVAL TIME - 12:15 PM @ [facility], NO RESTRICTIONS TO TEST AND PATIENT TO FU WITH DR. KINARD FOR RESULTS ON 01-21-19 @ 3:45 PM, NO ANSWER WILL CALL LATER.

## 2018-12-17 DIAGNOSIS — J019 Acute sinusitis, unspecified: Secondary | ICD-10-CM | POA: Diagnosis not present

## 2018-12-17 IMAGING — CT CT CHEST LCS NODULE FOLLOW-UP W/O CM
2 of 4 series · 8 of 40 positions shown, 10 images · non-contrast
Comparison: Low-dose lung cancer screening chest CT 12/13/2017.

CLINICAL DATA: 66-year-old female current smoker with history of
abnormal lung cancer screening chest CT. Follow-up study.

EXAM:
CT CHEST WITHOUT CONTRAST FOR LUNG CANCER SCREENING NODULE FOLLOW-UP
TECHNIQUE: Multidetector CT imaging of the chest was performed following the
standard protocol without IV contrast.

[Series 4: coronal · coronal · 0.59mm/px · 3 of 229 slices shown]
[im 46/229  lung]
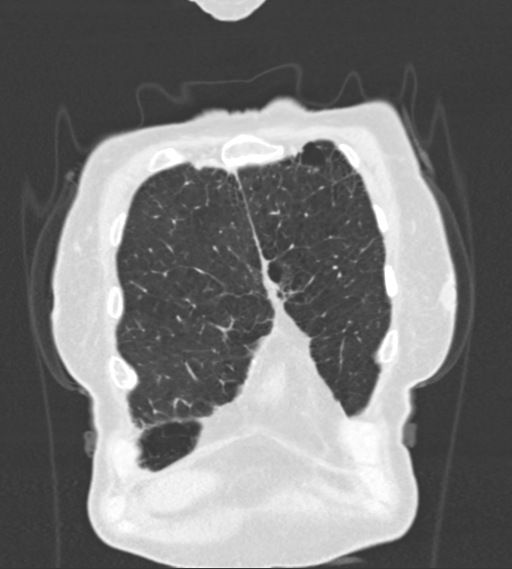
[im 92/229  lung]
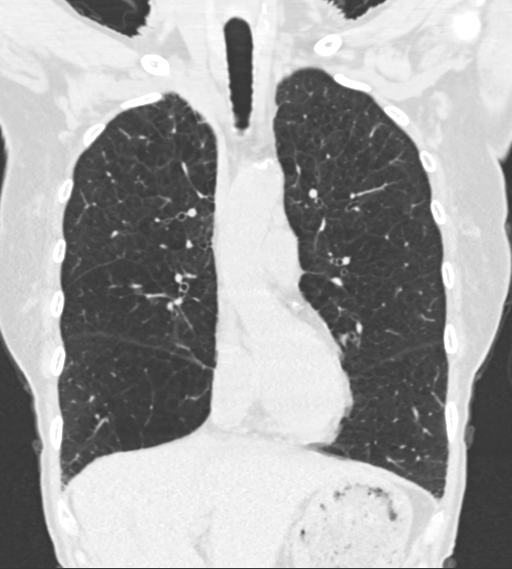
[im 137/229  lung]
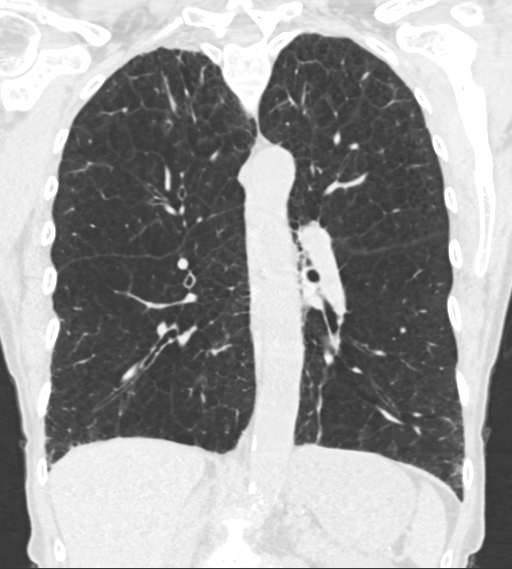

[ct lung segmentation data · axial · 0.66mm/px · z∈[-343,-343]mm · 5 of 336 frames shown]
[frame 1/336  mediastinal]
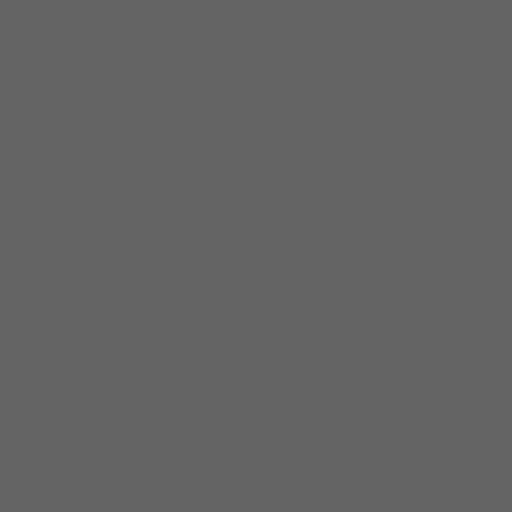
[frame 1/336  lung]
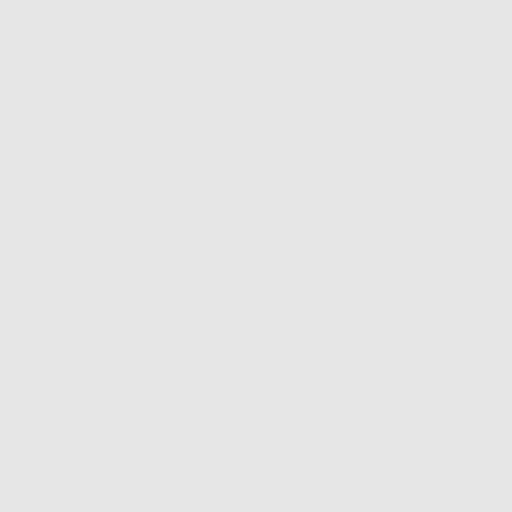
[frame 38/336  lung]
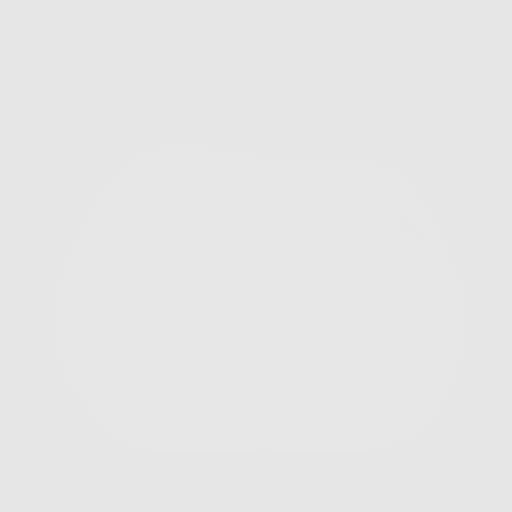
[frame 75/336  lung]
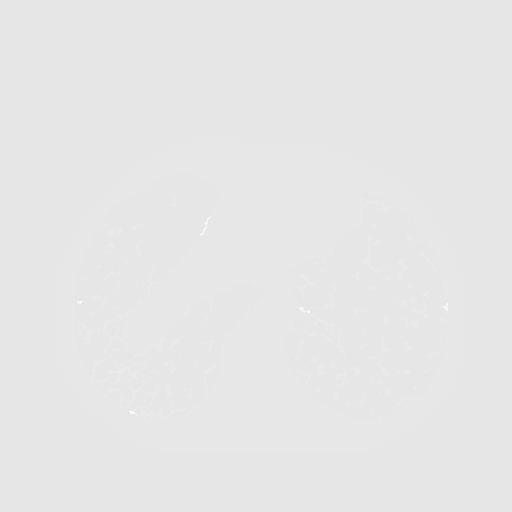
[frame 112/336  lung]
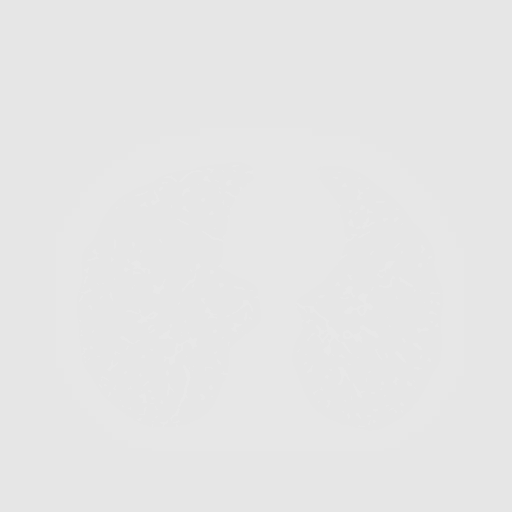
[frame 149/336  mediastinal]
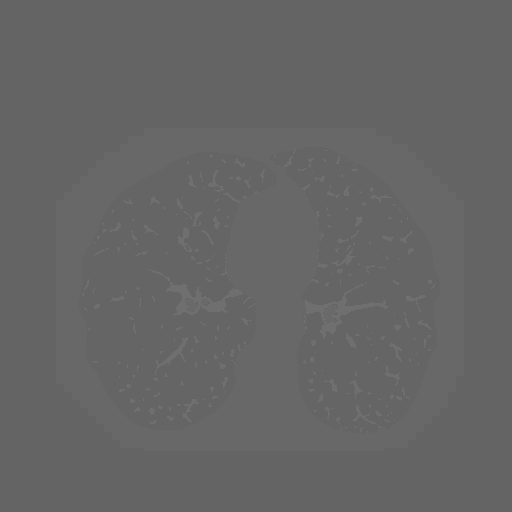
[frame 149/336  lung]
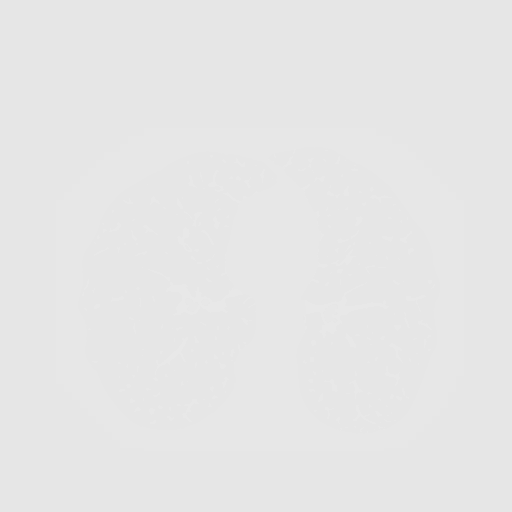

[8 of 40 positions shown; findings below may reference images not displayed]

FINDINGS: Cardiovascular: Heart size is normal. There is no significant
pericardial fluid, thickening or pericardial calcification. There is
aortic atherosclerosis, as well as atherosclerosis of the great
vessels of the mediastinum and the coronary arteries, including
calcified atherosclerotic plaque in the left main, left anterior
descending, left circumflex and right coronary arteries.

Mediastinum/Nodes: No pathologically enlarged mediastinal or hilar
lymph nodes. Please note that accurate exclusion of hilar adenopathy
is limited on noncontrast CT scans. Esophagus is unremarkable in
appearance. No axillary lymphadenopathy.

Lungs/Pleura: Multiple pulmonary nodules are again noted in the
lungs bilaterally, generally similar to the prior examination,
including one of the larger nodules in the right lower lobe.
However, the largest nodule in the right lower lobe which is highly
irregular in shape (axial image 254 of series 3), demonstrates a
volume derived mean diameter of 13.6 mm on today's examination,
significantly increased from 9.9 mm on the prior study. Diffuse
bronchial wall thickening with moderate centrilobular and paraseptal
emphysema. Scarring in the apex of the lungs bilaterally. No acute
consolidative airspace disease. No pleural effusions.

Upper Abdomen: Aortic atherosclerosis.

Musculoskeletal: There are no aggressive appearing lytic or blastic
lesions noted in the visualized portions of the skeleton.
IMPRESSION: 1. Lung-RADS 4BS, suspicious. Additional imaging evaluation or
consultation with Pulmonology or Thoracic Surgery recommended.
2. The "S" modifier above refers to potentially clinically
significant non lung cancer related findings. Specifically, there is
aortic atherosclerosis, in addition to left main and 3 vessel
coronary artery disease. Please note that although the presence of
coronary artery calcium documents the presence of coronary artery
disease, the severity of this disease and any potential stenosis
cannot be assessed on this non-gated CT examination. Assessment for
potential risk factor modification, dietary therapy or pharmacologic
therapy may be warranted, if clinically indicated.
3. Mild diffuse bronchial thickening with moderate centrilobular and
paraseptal emphysema; imaging findings suggestive of underlying
COPD.

These results will be called to the ordering clinician or
representative by the Radiologist Assistant, and communication
documented in the PACS or zVision Dashboard.

Aortic Atherosclerosis (KZJZ7-OW9.9) and Emphysema ([id number].X).

## 2019-01-18 ENCOUNTER — Ambulatory Visit (HOSPITAL_COMMUNITY)
Admission: RE | Admit: 2019-01-18 | Discharge: 2019-01-18 | Disposition: A | Discharge status: Home / Self Care | Payer: PPO | Source: Ambulatory Visit | Attending: Radiation Oncology | Admitting: Radiation Oncology

## 2019-01-18 DIAGNOSIS — C3490 Malignant neoplasm of unspecified part of unspecified bronchus or lung: Secondary | ICD-10-CM | POA: Diagnosis not present

## 2019-01-18 DIAGNOSIS — C3491 Malignant neoplasm of unspecified part of right bronchus or lung: Secondary | ICD-10-CM

## 2019-01-21 ENCOUNTER — Ambulatory Visit
Admission: RE | Admit: 2019-01-21 | Discharge: 2019-01-21 | Disposition: A | Discharge status: Home / Self Care | Payer: PPO | Source: Ambulatory Visit | Attending: Radiation Oncology | Admitting: Radiation Oncology

## 2019-01-21 ENCOUNTER — Other Ambulatory Visit: Payer: Self-pay

## 2019-01-21 ENCOUNTER — Encounter: Payer: Self-pay | Admitting: Radiation Oncology

## 2019-01-21 VITALS — BP 149/74 | HR 93 | Temp 98.3°F | Resp 18 | Ht 65.0 in | Wt 100.1 lb

## 2019-01-21 DIAGNOSIS — Z79899 Other long term (current) drug therapy: Secondary | ICD-10-CM | POA: Insufficient documentation

## 2019-01-21 DIAGNOSIS — C3490 Malignant neoplasm of unspecified part of unspecified bronchus or lung: Secondary | ICD-10-CM | POA: Insufficient documentation

## 2019-01-21 DIAGNOSIS — Z08 Encounter for follow-up examination after completed treatment for malignant neoplasm: Secondary | ICD-10-CM | POA: Diagnosis not present

## 2019-01-21 DIAGNOSIS — Z7982 Long term (current) use of aspirin: Secondary | ICD-10-CM | POA: Diagnosis not present

## 2019-01-21 DIAGNOSIS — Z510 Encounter for antineoplastic radiation therapy: Secondary | ICD-10-CM | POA: Insufficient documentation

## 2019-01-21 DIAGNOSIS — C3431 Malignant neoplasm of lower lobe, right bronchus or lung: Secondary | ICD-10-CM | POA: Diagnosis not present

## 2019-01-21 DIAGNOSIS — C3491 Malignant neoplasm of unspecified part of right bronchus or lung: Secondary | ICD-10-CM

## 2019-01-21 NOTE — Progress Notes (Signed)
Pt presents today for f/u with Dr. Sondra Come. Pt is unaccompanied. Pt reports recent sinusitis that has resolved after two rounds of antibiotics. Pt reports rare cough, clear sputum. Pt denies hemoptysis. Pt reports SOB with exertion. Pt denies difficulty swallowing.   BP (!) 149/74 (BP Location: Left Arm, Patient Position: Sitting)   Pulse 93   Temp 98.3 F (36.8 C) (Oral)   Resp 18   Ht 5\' 5"  (1.651 m)   Wt 100 lb 2 oz (45.4 kg)   SpO2 98%   BMI 16.66 kg/m   Wt Readings from Last 3 Encounters:  01/21/19 100 lb 2 oz (45.4 kg)  10/18/18 100 lb (45.4 kg)  08/29/18 100 lb 9.6 oz (45.6 kg)   Loma Sousa, RN BSN

## 2019-01-21 NOTE — Progress Notes (Signed)
Radiation Oncology         [phone] ________________________________  Name: Lindsey Rowland MRN: [id number]  Date: 01/21/2019  DOB: [birth year]  Follow-Up Visit Note  CC: Billie Ruddy, MD  Collene Gobble, MD    ICD-10-CM   1. Non-small cell cancer of right lung [facility]) C34.91 CT Chest Wo Contrast    Diagnosis:   Non-Small cell lung cancer, clinical stage I-II   Interval Since Last Radiation:  4 months  Radiation treatment dates:   09/10/18, 09/12/18/ 09/17/18  Site/dose:   Right lung, 18Gy in 3 fractions for a total dose of 54 Gy.  Narrative:  The patient returns today for routine follow-up.  she is doing well overall. She had a severe sinus infection last holiday season. She reports associated mildly resolved productive cough (after leaf blowing in the cold weather), fatigue, and resolved rhinorrhea. She denies CP, hemoptysis, and any other symptoms.   She had a CT scan on January 18, 2019 which revealed her omplex lesion right lower lobe measuring slightly smaller. Lesion measures approximately 2.0 x 1.6 x 2.6 cm today which compares to 2.4 x 2.3 x 3.4 cm previously. No new or progressive interval findings.               ALLERGIES:  is allergic to amoxicillin.  Meds: Current Outpatient Medications  Medication Sig Dispense Refill  . albuterol (PROAIR HFA) 108 (90 Base) MCG/ACT inhaler Inhale 2 puffs into the lungs every 6 (six) hours as needed for wheezing or shortness of breath. For shortness of breath.    . Aspirin-Salicylamide-Caffeine (BC HEADACHE POWDER PO) Take 1 packet by mouth daily as needed (for pain or headache).     . celecoxib (CELEBREX) 400 MG capsule Take 400 mg by mouth daily.   0  . diphenoxylate-atropine (LOMOTIL) 2.5-0.025 MG tablet Take 1 tablet by mouth 4 (four) times daily as needed for diarrhea or loose stools. 30 tablet 0  . Multiple Vitamin (MULITIVITAMIN WITH MINERALS) TABS Take 1 tablet by mouth daily.    . pantoprazole (PROTONIX) 40 MG tablet  Take 1 tablet (40 mg total) by mouth daily.    Vladimir Faster Glycol-Propyl Glycol (SYSTANE OP) Place 1 drop into both eyes daily as needed (for dry eyes).    Marland Kitchen rOPINIRole (REQUIP) 0.5 MG tablet Take 1 tablet (0.5 mg total) by mouth at bedtime as needed (for restless leg).    . Tiotropium Bromide Monohydrate (SPIRIVA RESPIMAT) 2.5 MCG/ACT AERS Inhale 2 puffs into the lungs daily. 3 Inhaler 1  . tiZANidine (ZANAFLEX) 4 MG tablet Take 4 mg by mouth 2 (two) times daily as needed for muscle spasms.    . cyclobenzaprine (FLEXERIL) 10 MG tablet Take 1 tablet (10 mg total) by mouth 3 (three) times daily as needed for muscle spasms. (Patient not taking: Reported on 01/21/2019) 90 tablet 1  . diazepam (VALIUM) 10 MG tablet Take 1 tablet (10 mg total) by mouth at bedtime as needed for sleep. (Patient not taking: Reported on 10/18/2018)     No current facility-administered medications for this encounter.     Physical Findings: The patient is in no acute distress. Patient is alert and oriented.  height is 5\' 5"  (1.651 m) and weight is 100 lb 2 oz (45.4 kg). Her oral temperature is 98.3 F (36.8 C). Her blood pressure is 149/74 (abnormal) and her pulse is 93. Her respiration is 18 and oxygen saturation is 98%.   Lungs are clear to auscultation bilaterally. Heart  has regular rate and rhythm. No palpable cervical, supraclavicular, or axillary adenopathy. Abdomen soft, non-tender, normal bowel sounds.   Lab Findings: Lab Results  Component Value Date   WBC 6.2 07/30/2018   HGB 14.7 07/30/2018   HCT 44.8 07/30/2018   MCV 103.0 (H) 07/30/2018   PLT 267 07/30/2018    Radiographic Findings: Ct Chest Wo Contrast  Result Date: 01/18/2019 CLINICAL DATA:  Non-small-cell lung cancer. EXAM: CT CHEST WITHOUT CONTRAST TECHNIQUE: Multidetector CT imaging of the chest was performed following the standard protocol without IV contrast. COMPARISON:  06/14/2018 FINDINGS: Cardiovascular: The heart size is normal. No  substantial pericardial effusion. Coronary artery calcification is evident. Atherosclerotic calcification is noted in the wall of the thoracic aorta. Mediastinum/Nodes: 7 mm short axis precarinal lymph node is stable. No mediastinal lymphadenopathy. No evidence for gross hilar lymphadenopathy although assessment is limited by the lack of intravenous contrast on today's study. The esophagus has normal imaging features. There is no axillary lymphadenopathy. Lungs/Pleura: The central tracheobronchial airways are patent. Centrilobular and paraseptal emphysema noted bilaterally. Pleuroparenchymal scarring in the right apex is stable. Ill-defined irregular right lower lobe lesion is difficult to reproducibly measure given irregular margins and sub solid configuration. Lesion measures approximately 2.0 x 1.6 x 2.6 cm today which compares to 2.4 x 2.3 x 3.4 cm previously. On a single image, the lesion appears more confluent today but coronal and sagittal imaging show that this is very linear/platelike in appearance. Adjacent fiducial markers evident. No pleural effusion.  No suspicious nodule or mass in the left lung Upper Abdomen: Unremarkable. Musculoskeletal: No worrisome lytic or sclerotic osseous abnormality. IMPRESSION: 1. Complex lesion right lower lobe measures slightly smaller today. No new or progressive interval findings. 2.  Emphysema. (ICD10-J43.9) 3.  Aortic Atherosclerois (ICD10-170.0) Electronically Signed   By: Misty Stanley M.D.   On: 01/18/2019 16:15    Impression:  Clinically stable. Recent chest CT shows good response to her SBRT.    Plan:  Patient will have a Chest CT scan scheduled in 6 months and then follow up in Radiation Oncology soon after.   ____________________________________   Blair Promise, PhD, MD    This document serves as a record of services personally performed by Gery Pray, MD. It was created on his behalf by Mary-Margaret Loma Messing, a trained medical scribe. The  creation of this record is based on the scribe's personal observations and the provider's statements to them. This document has been checked and approved by the attending provider.

## 2019-01-24 ENCOUNTER — Encounter: Payer: Self-pay | Admitting: Family Medicine

## 2019-01-24 ENCOUNTER — Ambulatory Visit (INDEPENDENT_AMBULATORY_CARE_PROVIDER_SITE_OTHER): Payer: PPO | Admitting: Family Medicine

## 2019-01-24 VITALS — BP 112/70 | HR 90 | Temp 98.8°F | Ht 65.0 in | Wt 100.0 lb

## 2019-01-24 DIAGNOSIS — F17210 Nicotine dependence, cigarettes, uncomplicated: Secondary | ICD-10-CM | POA: Diagnosis not present

## 2019-01-24 DIAGNOSIS — M1990 Unspecified osteoarthritis, unspecified site: Secondary | ICD-10-CM

## 2019-01-24 DIAGNOSIS — K580 Irritable bowel syndrome with diarrhea: Secondary | ICD-10-CM

## 2019-01-24 DIAGNOSIS — Z8612 Personal history of poliomyelitis: Secondary | ICD-10-CM | POA: Diagnosis not present

## 2019-01-24 DIAGNOSIS — C3491 Malignant neoplasm of unspecified part of right bronchus or lung: Secondary | ICD-10-CM | POA: Diagnosis not present

## 2019-01-24 DIAGNOSIS — G2581 Restless legs syndrome: Secondary | ICD-10-CM | POA: Diagnosis not present

## 2019-01-24 DIAGNOSIS — J449 Chronic obstructive pulmonary disease, unspecified: Secondary | ICD-10-CM

## 2019-01-24 MED ORDER — CELECOXIB 400 MG PO CAPS: 400.0000 mg | ORAL_CAPSULE | Freq: Every day | ORAL | 1 refills | Status: DC

## 2019-01-24 MED ORDER — PANTOPRAZOLE SODIUM 40 MG PO TBEC: 40.0000 mg | DELAYED_RELEASE_TABLET | Freq: Every day | ORAL | 1 refills | Status: DC

## 2019-01-24 MED ORDER — DIPHENOXYLATE-ATROPINE 2.5-0.025 MG PO TABS: 1.0000 | ORAL_TABLET | Freq: Four times a day (QID) | ORAL | 2 refills | Status: DC | PRN

## 2019-01-24 MED ORDER — CYCLOBENZAPRINE HCL 10 MG PO TABS: 10.0000 mg | ORAL_TABLET | Freq: Every evening | ORAL | 2 refills | Status: DC | PRN

## 2019-01-24 NOTE — Progress Notes (Signed)
Subjective:    Patient ID: Lindsey Rowland, female    DOB: [birth year], 68 y.o.   MRN: [id number]  No chief complaint on file.   HPI Patient was seen today for f/u on chronic conditions and TOC, previously seen by Dr. Burnice Logan.  COPD: -followed by pulm, Dr, Lamonte Sakai -Using albuterol inhaler  Lung cancer: -Non-small cell cancer, right lung -Followed by Dr. Dwana Curd, [facility] -Had lung CT this week.  States nodule has decreased in size -Repeat CT in 6 months  Nicotine use: -Currently smoking 1 pack every 2 weeks -States cutting down  Restless leg syndrome: -States Requip does not help -States Flexeril helps -Requesting refill  IBS-D: -Taking Lomotil as needed -Notes improvement in her symptoms  Arthritis: -Notes pain in back and legs -Taking Celebrex 400 mg daily and Protonix 40 mg daily -Requesting refills on both medications  History of polio -Endorses decreased muscle mass in legs  Past Medical History:  Diagnosis Date  . Arthritis   . Cancer [facility])    "pre-cervical" cells  - age 59  . COPD (chronic obstructive pulmonary disease) ([place])   . Headache(784.0)   . Osteopenia   . Polio    as a child  . Ulcer    stomach hx    Allergies  Allergen Reactions  . Amoxicillin Diarrhea and Itching    ROS General: Denies fever, chills, night sweats, changes in weight, changes in appetite HEENT: Denies headaches, ear pain, changes in vision, rhinorrhea, sore throat CV: Denies CP, palpitations, SOB, orthopnea Pulm: Denies SOB, cough, wheezing GI: Denies abdominal pain, nausea, vomiting, constipation  +IBS-D GU: Denies dysuria, hematuria, frequency, vaginal discharge Msk: Denies muscle cramps, joint pains  +muscle wasting Neuro: Denies weakness, numbness, tingling Skin: Denies rashes, bruising Psych: Denies depression, anxiety, hallucinations    Objective:    Blood pressure 112/70, pulse 90, temperature 98.8 F (37.1 C), temperature source Oral,  height 5\' 5"  (1.651 m), weight 100 lb (45.4 kg), SpO2 95 %.   Gen. Pleasant, well-nourished, in no distress, normal affect   HEENT: [place]/AT, face symmetric, no scleral icterus, PERRLA, EOMI, nares patent without drainage, pharynx without erythema or exudate.  TMs normal bilaterally. Lungs: no accessory muscle use, CTAB, no wheezes or rales Cardiovascular: RRR, no m/r/g, no peripheral edema Abdomen: BS present, soft, NT/ND Musculoskeletal: No deformities, no cyanosis or clubbing, normal tone Neuro:  A&Ox3, CN II-XII intact, normal gait Skin:  Warm, no lesions/ rash   Wt Readings from Last 3 Encounters:  01/24/19 100 lb (45.4 kg)  01/21/19 100 lb 2 oz (45.4 kg)  10/18/18 100 lb (45.4 kg)    Lab Results  Component Value Date   WBC 6.2 07/30/2018   HGB 14.7 07/30/2018   HCT 44.8 07/30/2018   PLT 267 07/30/2018   GLUCOSE 61 (L) 07/30/2018   CHOL 183 01/22/2018   TRIG 136.0 01/22/2018   HDL 51.60 01/22/2018   LDLDIRECT 146.0 10/07/2013   LDLCALC 104 (H) 01/22/2018   ALT 16 07/30/2018   AST 25 07/30/2018   NA 143 07/30/2018   K 3.6 07/30/2018   CL 106 07/30/2018   CREATININE 0.66 07/30/2018   BUN 12 07/30/2018   CO2 27 07/30/2018   TSH 0.59 07/19/2017   INR 0.97 07/30/2018    Assessment/Plan:  Irritable bowel syndrome with diarrhea  -Patient advised to avoid foods known to cause symptoms, decrease stress - Plan: diphenoxylate-atropine (LOMOTIL) 2.5-0.025 MG tablet,   Non-small cell cancer of right lung ([place]) -Continue following  with oncology, Dr. Dwana Curd -Smoking cessation strongly encouraged -Follow-up CT in 6 months  COPD, mild ([place]) -Continue albuterol inhaler -Continue following with pulmonology -Smoking cessation strongly encouraged  POLIOMYELITIS, HX OF  Restless leg syndrome  - Plan: cyclobenzaprine (FLEXERIL) 10 MG tablet  Cigarette nicotine dependence without complication -Smoking cessation counseling greater than 3 minutes, less than 10  minutes -Patient strongly encouraged to stop smoking -We will continue to decrease the amount of cigarettes smoked -We will continue to monitor  Arthritis  - Plan: celecoxib (CELEBREX) 400 MG capsule   Follow-up PRN.  Patient advised she can transfer care to the granddaughter office as this is closer to her home in [place].  Grier Mitts, MD

## 2019-01-24 NOTE — Patient Instructions (Signed)
Arthritis Arthritis is a term that is commonly used to refer to joint pain or joint disease. There are more than 100 types of arthritis. What are the causes? The most common cause of this condition is wear and tear of a joint. Other causes include:  Gout.  Inflammation of a joint.  An infection of a joint.  Sprains and other injuries near the joint.  A drug reaction or allergic reaction. In some cases, the cause may not be known. What are the signs or symptoms? The main symptom of this condition is pain in the joint with movement. Other symptoms include:  Redness, swelling, or stiffness at a joint.  Warmth coming from the joint.  Fever.  Overall feeling of illness. How is this diagnosed? This condition may be diagnosed with a physical exam and tests, including:  Blood tests.  Urine tests.  Imaging tests, such as MRI, X-rays, or a CT scan. Sometimes, fluid is removed from a joint for testing. How is this treated? Treatment for this condition may involve:  Treatment of the cause, if it is known.  Rest.  Raising (elevating) the joint.  Applying cold or hot packs to the joint.  Medicines to improve symptoms and reduce inflammation.  Injections of a steroid such as cortisone into the joint to help reduce pain and inflammation. Depending on the cause of your arthritis, you may need to make lifestyle changes to reduce stress on your joint. These changes may include exercising more and losing weight. Follow these instructions at home: Medicines  Take over-the-counter and prescription medicines only as told by your health care provider.  Do not take aspirin to relieve pain if gout is suspected. Activity  Rest your joint if told by your health care provider. Rest is important when your disease is active and your joint feels painful, swollen, or stiff.  Avoid activities that make the pain worse. It is important to balance activity with rest.  Exercise your joint  regularly with range-of-motion exercises as told by your health care provider. Try doing low-impact exercise, such as: ? Swimming. ? Water aerobics. ? Biking. ? Walking. Joint Care   If your joint is swollen, keep it elevated if told by your health care provider.  If your joint feels stiff in the morning, try taking a warm shower.  If directed, apply heat to the joint. If you have diabetes, do not apply heat without permission from your health care provider. ? Put a towel between the joint and the hot pack or heating pad. ? Leave the heat on the area for 20-30 minutes.  If directed, apply ice to the joint: ? Put ice in a plastic bag. ? Place a towel between your skin and the bag. ? Leave the ice on for 20 minutes, 2-3 times per day.  Keep all follow-up visits as told by your health care provider. This is important. Contact a health care provider if:  The pain gets worse.  You have a fever. Get help right away if:  You develop severe joint pain, swelling, or redness.  Many joints become painful and swollen.  You develop severe back pain.  You develop severe weakness in your leg.  You cannot control your bladder or bowels. This information is not intended to replace advice given to you by your health care provider. Make sure you discuss any questions you have with your health care provider. Document Released: 12/29/2004 Document Revised: 04/28/2016 Document Reviewed: 02/16/2015 Elsevier Interactive Patient Education  2019 Elsevier  Inc.  Steps to Quit Smoking  Smoking tobacco can be bad for your health. It can also affect almost every organ in your body. Smoking puts you and people around you at risk for many serious long-lasting (chronic) diseases. Quitting smoking is hard, but it is one of the best things that you can do for your health. It is never too late to quit. What are the benefits of quitting smoking? When you quit smoking, you lower your risk for getting serious  diseases and conditions. They can include:  Lung cancer or lung disease.  Heart disease.  Stroke.  Heart attack.  Not being able to have children (infertility).  Weak bones (osteoporosis) and broken bones (fractures). If you have coughing, wheezing, and shortness of breath, those symptoms may get better when you quit. You may also get sick less often. If you are pregnant, quitting smoking can help to lower your chances of having a baby of low birth weight. What can I do to help me quit smoking? Talk with your doctor about what can help you quit smoking. Some things you can do (strategies) include:  Quitting smoking totally, instead of slowly cutting back how much you smoke over a period of time.  Going to in-person counseling. You are more likely to quit if you go to many counseling sessions.  Using resources and support systems, such as: ? Database administrator with a Social worker. ? Phone quitlines. ? Careers information officer. ? Support groups or group counseling. ? Text messaging programs. ? Mobile phone apps or applications.  Taking medicines. Some of these medicines may have nicotine in them. If you are pregnant or breastfeeding, do not take any medicines to quit smoking unless your doctor says it is okay. Talk with your doctor about counseling or other things that can help you. Talk with your doctor about using more than one strategy at the same time, such as taking medicines while you are also going to in-person counseling. This can help make quitting easier. What things can I do to make it easier to quit? Quitting smoking might feel very hard at first, but there is a lot that you can do to make it easier. Take these steps:  Talk to your family and friends. Ask them to support and encourage you.  Call phone quitlines, reach out to support groups, or work with a Social worker.  Ask people who smoke to not smoke around you.  Avoid places that make you want (trigger) to smoke, such  as: ? Bars. ? Parties. ? Smoke-break areas at work.  Spend time with people who do not smoke.  Lower the stress in your life. Stress can make you want to smoke. Try these things to help your stress: ? Getting regular exercise. ? Deep-breathing exercises. ? Yoga. ? Meditating. ? Doing a body scan. To do this, close your eyes, focus on one area of your body at a time from head to toe, and notice which parts of your body are tense. Try to relax the muscles in those areas.  Download or buy apps on your mobile phone or tablet that can help you stick to your quit plan. There are many free apps, such as QuitGuide from the [employer] Office manager for Disease Control and Prevention). You can find more support from smokefree.gov and other websites. This information is not intended to replace advice given to you by your health care provider. Make sure you discuss any questions you have with your health care provider. Document Released: 09/17/2009  Document Revised: 07/19/2016 Document Reviewed: 04/07/2015 Elsevier Interactive Patient Education  2019 Elsevier Inc.  Restless Legs Syndrome Restless legs syndrome is a condition that causes uncomfortable feelings or sensations in the legs, especially while sitting or lying down. The sensations usually cause an overwhelming urge to move the legs. The arms can also sometimes be affected. The condition can range from mild to severe. The symptoms often interfere with a person's ability to sleep. What are the causes? The cause of this condition is not known. What increases the risk? The following factors may make you more likely to develop this condition:  Being older than 50.  Pregnancy.  Being a woman. In general, the condition is more common in women than in men.  A family history of the condition.  Having iron deficiency.  Overuse of caffeine, nicotine, or alcohol.  Certain medical conditions, such as kidney disease, Parkinson's disease, or nerve  damage.  Certain medicines, such as those for high blood pressure, nausea, colds, allergies, depression, and some heart conditions. What are the signs or symptoms? The main symptom of this condition is uncomfortable sensations in the legs, such as:  Pulling.  Tingling.  Prickling.  Throbbing.  Crawling.  Burning. Usually, the sensations:  Affect both sides of the body.  Are worse when you sit or lie down.  Are worse at night. These may wake you up or make it difficult to fall asleep.  Make you have a strong urge to move your legs.  Are temporarily relieved by moving your legs. The arms can also be affected, but this is rare. People who have this condition often have tiredness during the day because of their lack of sleep at night. How is this diagnosed? This condition may be diagnosed based on:  Your symptoms.  Blood tests. In some cases, you may be monitored in a sleep lab by a specialist (a sleep study). This can detect any disruptions in your sleep. How is this treated? This condition is treated by managing the symptoms. This may include:  Lifestyle changes, such as exercising, using relaxation techniques, and avoiding caffeine, alcohol, or tobacco.  Medicines. Anti-seizure medicines may be tried first. Follow these instructions at home:     General instructions  Take over-the-counter and prescription medicines only as told by your health care provider.  Use methods to help relieve the uncomfortable sensations, such as: ? Massaging your legs. ? Walking or stretching. ? Taking a cold or hot bath.  Keep all follow-up visits as told by your health care provider. This is important. Lifestyle  Practice good sleep habits. For example, go to bed and get up at the same time every day. Most adults should get 7-9 hours of sleep each night.  Exercise regularly. Try to get at least 30 minutes of exercise most days of the week.  Practice ways of relaxing, such as  yoga or meditation.  Avoid caffeine and alcohol.  Do not use any products that contain nicotine or tobacco, such as cigarettes and e-cigarettes. If you need help quitting, ask your health care provider. Contact a health care provider if:  Your symptoms get worse or they do not improve with treatment. Summary  Restless legs syndrome is a condition that causes uncomfortable feelings or sensations in the legs, especially while sitting or lying down.  The symptoms often interfere with a person's ability to sleep.  This condition is treated by managing the symptoms. You may need to make lifestyle changes or take medicines. This information  is not intended to replace advice given to you by your health care provider. Make sure you discuss any questions you have with your health care provider. Document Released: 11/11/2002 Document Revised: 12/11/2017 Document Reviewed: 12/11/2017 Elsevier Interactive Patient Education  2019 [employer].

## 2019-05-20 DIAGNOSIS — Z1231 Encounter for screening mammogram for malignant neoplasm of breast: Secondary | ICD-10-CM | POA: Diagnosis not present

## 2019-07-01 ENCOUNTER — Telehealth: Payer: Self-pay | Admitting: *Deleted

## 2019-07-01 NOTE — Telephone Encounter (Signed)
Called patient to inform that test hasn't been precerted as of 1-84-85, I informed her  that I could not schedule this until the precert on insurance has been done, patient verified understanding this.

## 2019-07-15 ENCOUNTER — Telehealth: Payer: Self-pay | Admitting: *Deleted

## 2019-07-15 NOTE — Telephone Encounter (Signed)
CALLED PATIENT TO INFORM OF CT FOR 07-19-19 - ARRIVAL TIME - 11:45 AM @ [facility], NO RESTRICTIONS TO TEST DUE TO TEST BEING ORDERED WITHOUT CONTRAST, PT. TO RECEIVE RESULTS FROM DR. KINARD ON 07-22-19 , LVM FOR A RETURN CALL

## 2019-07-19 ENCOUNTER — Ambulatory Visit (HOSPITAL_COMMUNITY)
Admission: RE | Admit: 2019-07-19 | Discharge: 2019-07-19 | Disposition: A | Discharge status: Home / Self Care | Payer: PPO | Source: Ambulatory Visit | Attending: Radiation Oncology | Admitting: Radiation Oncology

## 2019-07-19 ENCOUNTER — Other Ambulatory Visit: Payer: Self-pay

## 2019-07-19 DIAGNOSIS — C3491 Malignant neoplasm of unspecified part of right bronchus or lung: Secondary | ICD-10-CM

## 2019-07-19 DIAGNOSIS — C3490 Malignant neoplasm of unspecified part of unspecified bronchus or lung: Secondary | ICD-10-CM | POA: Diagnosis not present

## 2019-07-22 ENCOUNTER — Other Ambulatory Visit: Payer: Self-pay

## 2019-07-22 ENCOUNTER — Ambulatory Visit
Admission: RE | Admit: 2019-07-22 | Discharge: 2019-07-22 | Disposition: A | Discharge status: Home / Self Care | Payer: PPO | Source: Ambulatory Visit | Attending: Radiation Oncology | Admitting: Radiation Oncology

## 2019-07-22 ENCOUNTER — Encounter: Payer: Self-pay | Admitting: Radiation Oncology

## 2019-07-22 VITALS — BP 144/68 | HR 97 | Temp 99.1°F | Resp 18 | Ht 65.0 in | Wt 96.4 lb

## 2019-07-22 DIAGNOSIS — Z923 Personal history of irradiation: Secondary | ICD-10-CM | POA: Diagnosis not present

## 2019-07-22 DIAGNOSIS — R0602 Shortness of breath: Secondary | ICD-10-CM | POA: Insufficient documentation

## 2019-07-22 DIAGNOSIS — Z79899 Other long term (current) drug therapy: Secondary | ICD-10-CM | POA: Diagnosis not present

## 2019-07-22 DIAGNOSIS — I2510 Atherosclerotic heart disease of native coronary artery without angina pectoris: Secondary | ICD-10-CM | POA: Diagnosis not present

## 2019-07-22 DIAGNOSIS — Z08 Encounter for follow-up examination after completed treatment for malignant neoplasm: Secondary | ICD-10-CM | POA: Diagnosis not present

## 2019-07-22 DIAGNOSIS — I700 Atherosclerosis of aorta: Secondary | ICD-10-CM | POA: Insufficient documentation

## 2019-07-22 DIAGNOSIS — C3491 Malignant neoplasm of unspecified part of right bronchus or lung: Secondary | ICD-10-CM

## 2019-07-22 DIAGNOSIS — R911 Solitary pulmonary nodule: Secondary | ICD-10-CM | POA: Diagnosis not present

## 2019-07-22 NOTE — Patient Instructions (Signed)
Coronavirus (COVID-19) Are you at risk?  Are you at risk for the Coronavirus (COVID-19)?  To be considered HIGH RISK for Coronavirus (COVID-19), you have to meet the following criteria:  . Traveled to [place], [place], [place], [place] or [place]; or in the [place] to [place], [place], [place], or [place]; and have fever, cough, and shortness of breath within the last 2 weeks of travel OR . Been in close contact with a person diagnosed with COVID-19 within the last 2 weeks and have fever, cough, and shortness of breath . IF YOU DO NOT MEET THESE CRITERIA, YOU ARE CONSIDERED LOW RISK FOR COVID-19.  What to do if you are HIGH RISK for COVID-19?  . If you are having a medical emergency, call 911. . Seek medical care right away. Before you go to a doctor's office, urgent care or emergency department, call ahead and tell them about your recent travel, contact with someone diagnosed with COVID-19, and your symptoms. You should receive instructions from your physician's office regarding next steps of care.  . When you arrive at healthcare provider, tell the healthcare staff immediately you have returned from visiting [place], [place], [place], [place] or [place]; or traveled in the [place] to [place], [place], [place], or [place]; in the last two weeks or you have been in close contact with a person diagnosed with COVID-19 in the last 2 weeks.   . Tell the health care staff about your symptoms: fever, cough and shortness of breath. . After you have been seen by a medical provider, you will be either: o Tested for (COVID-19) and discharged home on quarantine except to seek medical care if symptoms worsen, and asked to  - Stay home and avoid contact with others until you get your results (4-5 days)  - Avoid travel on public transportation if possible (such as bus, train, or airplane) or o Sent to the Emergency Department by EMS for evaluation, COVID-19 testing, and possible  admission depending on your condition and test results.  What to do if you are LOW RISK for COVID-19?  Reduce your risk of any infection by using the same precautions used for avoiding the common cold or flu:  . Wash your hands often with soap and warm water for at least 20 seconds.  If soap and water are not readily available, use an alcohol-based hand sanitizer with at least 60% alcohol.  . If coughing or sneezing, cover your mouth and nose by coughing or sneezing into the elbow areas of your shirt or coat, into a tissue or into your sleeve (not your hands). . Avoid shaking hands with others and consider head nods or verbal greetings only. . Avoid touching your eyes, nose, or mouth with unwashed hands.  . Avoid close contact with people who are sick. . Avoid places or events with large numbers of people in one location, like concerts or sporting events. . Carefully consider travel plans you have or are making. . If you are planning any travel outside or inside the [place], visit the CDC's Travelers' Health webpage for the latest health notices. . If you have some symptoms but not all symptoms, continue to monitor at home and seek medical attention if your symptoms worsen. . If you are having a medical emergency, call 911.   ADDITIONAL HEALTHCARE OPTIONS FOR PATIENTS  [facility] / e-Visit: [email or web address]         [facility]: [phone]  [facility]: [phone]                   [facility]: [phone]   

## 2019-07-22 NOTE — Progress Notes (Signed)
Radiation Oncology         [phone] ________________________________  Name: Lindsey Rowland MRN: [id number]  Date: 07/22/2019  DOB: 02-10-1951  Follow-Up Visit Note  CC: Billie Ruddy, MD  Collene Gobble, MD    ICD-10-CM   1. Non-small cell cancer of right lung [facility])  C34.91 CT Chest Wo Contrast    Diagnosis:   Non-Small cell lung cancer, clinical stage I-II   Interval Since Last Radiation:  10 months 09/10/18, 09/12/18/ 09/17/18: Right lung / 54 Gy in 3 fractions (SBRT)  Narrative:  The patient returns today for routine follow-up.   Since her last visit, she underwent repeat chest CT on 07/19/2019. This showed: nonspecific increased soft tissue surrounding posterior fiducial marker in the area of previously treated tumor, may represent evolving postinflammatory or infectious changes due to SBRT, or recurrent tumor; stable left lung nodules.  On review of systems, she reports shortness of breath associated with the heat/humidity and occasional productive cough in the morning with clear sputum. She denies hemoptysis and difficulty swallowing.    ALLERGIES:  is allergic to amoxicillin.  Meds: Current Outpatient Medications  Medication Sig Dispense Refill  . albuterol (PROAIR HFA) 108 (90 Base) MCG/ACT inhaler Inhale 2 puffs into the lungs every 6 (six) hours as needed for wheezing or shortness of breath. For shortness of breath.    . Aspirin-Salicylamide-Caffeine (BC HEADACHE POWDER PO) Take 1 packet by mouth daily as needed (for pain or headache).     . celecoxib (CELEBREX) 400 MG capsule Take 1 capsule (400 mg total) by mouth daily. 90 capsule 1  . cyclobenzaprine (FLEXERIL) 10 MG tablet Take 1 tablet (10 mg total) by mouth at bedtime as needed for muscle spasms. 60 tablet 2  . diphenoxylate-atropine (LOMOTIL) 2.5-0.025 MG tablet Take 1 tablet by mouth 4 (four) times daily as needed for diarrhea or loose stools. 120 tablet 2  . Multiple Vitamin (MULITIVITAMIN WITH  MINERALS) TABS Take 1 tablet by mouth daily.    . pantoprazole (PROTONIX) 40 MG tablet Take 1 tablet (40 mg total) by mouth daily. 90 tablet 1  . Polyethyl Glycol-Propyl Glycol (SYSTANE OP) Place 1 drop into both eyes daily as needed (for dry eyes).    . Tiotropium Bromide Monohydrate (SPIRIVA RESPIMAT) 2.5 MCG/ACT AERS Inhale 2 puffs into the lungs daily. (Patient not taking: Reported on 07/22/2019) 3 Inhaler 1   No current facility-administered medications for this encounter.     Physical Findings: The patient is in no acute distress. Patient is alert and oriented.  height is 5\' 5"  (1.651 m) and weight is 96 lb 6 oz (43.7 kg). Her temporal temperature is 99.1 F (37.3 C). Her blood pressure is 144/68 (abnormal) and her pulse is 97. Her respiration is 18 and oxygen saturation is 97%.  Lungs are clear to auscultation bilaterally. Heart has regular rate and rhythm. No palpable cervical, supraclavicular, or axillary adenopathy. Abdomen soft, non-tender, normal bowel sounds.   Lab Findings: Lab Results  Component Value Date   WBC 6.2 07/30/2018   HGB 14.7 07/30/2018   HCT 44.8 07/30/2018   MCV 103.0 (H) 07/30/2018   PLT 267 07/30/2018    Radiographic Findings: Ct Chest Wo Contrast  Result Date: 07/19/2019 CLINICAL DATA:  Follow-up lung cancer.  Status post XRT. EXAM: CT CHEST WITHOUT CONTRAST TECHNIQUE: Multidetector CT imaging of the chest was performed following the standard protocol without IV contrast. COMPARISON:  01/18/2019 FINDINGS: Cardiovascular: Normal heart size. Aortic atherosclerosis. Three vessel coronary  artery calcifications. Mediastinum/Nodes: Normal appearance of the thyroid gland. The trachea appears patent and is midline. No mediastinal or hilar adenopathy. Lungs/Pleura: Advanced changes of emphysema with diffuse bronchial wall thickening. Within the posterolateral right lung base there is an area of scarring, fibrosis and masslike architectural distortion which is  unchanged when compared with the previous exam compatible with changes due to external beam radiation. When compared with the previous exam there is increased soft tissue surrounding one of the right lower lobe fiducial markers, image 117/7 and image 80/5. Here, the marker is surrounded by a 1.2 x 1.4 cm area of increased soft tissue, new from previous exam. No additional new findings identified within the right base in the area of treated tumor. 4 mm left lower lobe lung nodule is unchanged, image 86/7. Nodule within the anterolateral left base is unchanged measuring 6 mm, image 150/7. Upper Abdomen: No acute abnormality.  Aortic atherosclerosis. Musculoskeletal: Mild scoliosis and lumbar spondylosis. IMPRESSION: 1. Compared with previous exam there is been increased soft tissue surrounding posterior fiducial marker in the area of previously treated tumor. The appearance is nonspecific and may represent evolving postinflammatory or infectious changes due to external beam radiation. Cannot rule out recurrent tumor and short-term interval follow-up is advised. If this continues to progress, consider PET-CT to assess for recurrent metabolically active tumor. 2. Stable left lung nodules. 3. Aortic Atherosclerosis (ICD10-I70.0) and Emphysema (ICD10-J43.9). Coronary artery calcifications. Electronically Signed   By: Kerby Moors M.D.   On: 07/19/2019 13:23    Impression:  Clinically stable. Recent chest CT shows good response to her SBRT but recommendations were for short interval chest CT scan given the thickening around 1 of the fiducial markers.  Plan:  Patient will have a Chest CT scan scheduled in 3 months and then follow up in Radiation Oncology soon after.   ____________________________________   Blair Promise, PhD, MD   This document serves as a record of services personally performed by Gery Pray, MD. It was created on his behalf by Wilburn Mylar, a trained medical scribe. The creation of  this record is based on the scribe's personal observations and the provider's statements to them. This document has been checked and approved by the attending provider.

## 2019-07-22 NOTE — Progress Notes (Signed)
Pt presents today for f/u with Dr. Sondra Come for CT results. Pt reports SOB with heat/humidity. Pt reports occasional cough in am, clear sputum. Pt denies hemoptysis. Pt denies difficulty swallowing.   BP (!) 144/68 (BP Location: Left Arm, Patient Position: Sitting)   Pulse 97   Temp 99.1 F (37.3 C) (Temporal)   Resp 18   Ht 5\' 5"  (1.651 m)   Wt 96 lb 6 oz (43.7 kg)   SpO2 97%   BMI 16.04 kg/m   Wt Readings from Last 3 Encounters:  07/22/19 96 lb 6 oz (43.7 kg)  01/24/19 100 lb (45.4 kg)  01/21/19 100 lb 2 oz (45.4 kg)   Loma Sousa, RN BSN

## 2019-07-23 ENCOUNTER — Other Ambulatory Visit: Payer: Self-pay | Admitting: Family Medicine

## 2019-07-23 ENCOUNTER — Telehealth: Payer: Self-pay | Admitting: *Deleted

## 2019-07-23 NOTE — Telephone Encounter (Signed)
Called patient to inform of fu appt.with Dr. Sondra Come on 10-24-19, lvm for a return call

## 2019-08-06 ENCOUNTER — Telehealth: Payer: Self-pay | Admitting: Emergency Medicine

## 2019-08-06 MED ORDER — SPIRIVA RESPIMAT 2.5 MCG/ACT IN AERS: 2.0000 | INHALATION_SPRAY | Freq: Every day | RESPIRATORY_TRACT | 1 refills | Status: DC

## 2019-08-06 NOTE — Telephone Encounter (Signed)
Spoke with pt, she is requesting a refill for Spiriva. I asked her about a her f/u appt since she hasn't been here in a year. I advised her that I would send in a refill of Spiriva. She made an appt with Dr. Lamonte Sakai on 9/16 at 10:45am. Rx sent in and nothing further is needed.

## 2019-08-21 ENCOUNTER — Ambulatory Visit: Payer: PPO | Admitting: Emergency Medicine

## 2019-08-21 ENCOUNTER — Other Ambulatory Visit: Payer: Self-pay

## 2019-08-21 ENCOUNTER — Encounter: Payer: Self-pay | Admitting: Emergency Medicine

## 2019-08-21 DIAGNOSIS — J449 Chronic obstructive pulmonary disease, unspecified: Secondary | ICD-10-CM | POA: Diagnosis not present

## 2019-08-21 DIAGNOSIS — F17200 Nicotine dependence, unspecified, uncomplicated: Secondary | ICD-10-CM | POA: Diagnosis not present

## 2019-08-21 DIAGNOSIS — C3491 Malignant neoplasm of unspecified part of right bronchus or lung: Secondary | ICD-10-CM

## 2019-08-21 MED ORDER — ALBUTEROL SULFATE HFA 108 (90 BASE) MCG/ACT IN AERS: 2.0000 | INHALATION_SPRAY | Freq: Four times a day (QID) | RESPIRATORY_TRACT | 11 refills | Status: DC | PRN

## 2019-08-21 NOTE — Assessment & Plan Note (Signed)
No flares.  Remains active.  Continue Spiriva once daily.  We will refill her albuterol.  She is up-to-date on pneumonia shot, needs a high-dose flu shot.

## 2019-08-21 NOTE — Patient Instructions (Addendum)
We talked about stopping smoking today.  When you are ready to try to set a quit date please let us know.  We may be able to use medications to make the transition off cigarettes easier. Continue Spiriva as you have been taking it once daily. Keep albuterol available use 2 puffs if needed for shortness of breath, chest tightness, wheezing.  We will refill this for you today. Get your CT scan of the chest in November as planned by Dr. Sondra Come.  Follow with him to review. Your pneumonia vaccine is up-to-date. You need the high-dose flu shot this fall. Follow with Dr. Lamonte Sakai in 12 months or sooner if you have any problems.

## 2019-08-21 NOTE — Assessment & Plan Note (Signed)
Area of increased opacity near her posterior right lower lobe fiducial marker.  It looks like scar but difficult to fully interpret.  She is planning to have a repeat CT in November to look for interval change/stability

## 2019-08-21 NOTE — Assessment & Plan Note (Signed)
Discussed cessation strategies with her today.

## 2019-08-21 NOTE — Progress Notes (Signed)
Subjective:    Patient ID: Lindsey Rowland, female    DOB: 1951-07-30, 68 y.o.   MRN: [id number]  HPI  ROV 08/21/2019 --follow-up visit for 68 year old woman with history of tobacco use, still smokes (30+ pack years).  We have followed her for COPD FEV1 1.63 (69% predicted) and 03/2018, also non-small cell lung cancer which has been treated with SBRT.  Most recent CT scan of the chest was 07/19/2019 which I have reviewed, shows some progressive scarring and fibrosis in the right lower lobe.  Note was made of some increased soft tissue surrounding the posterior fiducial marker in the area of previous tumor, possible scar.  She remains on Spiriva, unsure if it does much for her.  Had albuterol, averaged about qd. Ran out of it.  She states that her breathing is doing well, has been a bit worse during the summer. She continues to exert, does outdoor yard work. No wheeze, she does cough especially when she is exposed to the outdoors. PNA shot UTD.    Review of Systems  Past Medical History:  Diagnosis Date  . Arthritis   . Cancer [facility])    "pre-cervical" cells  - age 13  . COPD (chronic obstructive pulmonary disease) ([place])   . Headache(784.0)   . Osteopenia   . Polio    as a child  . Ulcer    stomach hx     Family History  Problem Relation Age of Onset  . Hiatal hernia Father   . Colon cancer Neg Hx   . Esophageal cancer Neg Hx   . Stomach cancer Neg Hx   . Rectal cancer Neg Hx      Social History   Socioeconomic History  . Marital status: Married    Spouse name: Not on file  . Number of children: Not on file  . Years of education: Not on file  . Highest education level: Not on file  Occupational History  . Not on file  Social Needs  . Financial resource strain: Not on file  . Food insecurity    Worry: Not on file    Inability: Not on file  . Transportation needs    Medical: Not on file    Non-medical: Not on file  Tobacco Use  . Smoking status: Current Every Day  Smoker    Packs/day: 0.25    Years: 40.00    Pack years: 10.00    Types: Cigarettes  . Smokeless tobacco: Never Used  Substance and Sexual Activity  . Alcohol use: Yes    Alcohol/week: 14.0 standard drinks    Types: 14 Cans of beer per week  . Drug use: No  . Sexual activity: Never  Lifestyle  . Physical activity    Days per week: Not on file    Minutes per session: Not on file  . Stress: Not on file  Relationships  . Social Herbalist on phone: Not on file    Gets together: Not on file    Attends religious service: Not on file    Active member of club or organization: Not on file    Attends meetings of clubs or organizations: Not on file    Relationship status: Not on file  . Intimate partner violence    Fear of current or ex partner: Not on file    Emotionally abused: Not on file    Physically abused: Not on file    Forced sexual activity: Not on file  Other Topics Concern  . Not on file  Social History Narrative  . 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  No TB exposure.   Allergies  Allergen Reactions  . Amoxicillin Diarrhea and Itching     Outpatient Medications Prior to Visit  Medication Sig Dispense Refill  . Aspirin-Salicylamide-Caffeine (BC HEADACHE POWDER PO) Take 1 packet by mouth daily as needed (for pain or headache).     . celecoxib (CELEBREX) 400 MG capsule Take 1 capsule (400 mg total) by mouth daily. 90 capsule 1  . cyclobenzaprine (FLEXERIL) 10 MG tablet Take 1 tablet (10 mg total) by mouth at bedtime as needed for muscle spasms. 60 tablet 2  . diphenoxylate-atropine (LOMOTIL) 2.5-0.025 MG tablet Take 1 tablet by mouth 4 (four) times daily as needed for diarrhea or loose stools. 120 tablet 2  . Multiple Vitamin (MULITIVITAMIN WITH MINERALS) TABS Take 1 tablet by mouth daily.    . pantoprazole (PROTONIX) 40 MG tablet TAKE 1 TABLET BY MOUTH EVERY DAY 90 tablet 1  . Polyethyl Glycol-Propyl  Glycol (SYSTANE OP) Place 1 drop into both eyes daily as needed (for dry eyes).    . Tiotropium Bromide Monohydrate (SPIRIVA RESPIMAT) 2.5 MCG/ACT AERS Inhale 2 puffs into the lungs daily. 4 g 1  . albuterol (PROAIR HFA) 108 (90 Base) MCG/ACT inhaler Inhale 2 puffs into the lungs every 6 (six) hours as needed for wheezing or shortness of breath. For shortness of breath. (Patient not taking: Reported on 08/21/2019)     No facility-administered medications prior to visit.          Objective:   Physical Exam Vitals:   08/21/19 1039  BP: 124/72  Pulse: 72  SpO2: 94%  Weight: 93 lb 12.8 oz (42.5 kg)   Gen: Pleasant, thin, in no distress,  normal affect  ENT: No lesions,  mouth clear,  oropharynx clear, no postnasal drip  Neck: No JVD, no stridor  Lungs: No use of accessory muscles, clear without rales or rhonchi  Cardiovascular: RRR, heart sounds normal, no murmur or gallops, no peripheral edema  Musculoskeletal: No deformities, no cyanosis or clubbing  Neuro: alert, non focal  Skin: Warm, no lesions or rash        Assessment & Plan:  COPD, mild No flares.  Remains active.  Continue Spiriva once daily.  We will refill her albuterol.  She is up-to-date on pneumonia shot, needs a high-dose flu shot.  Non-small cell cancer of right lung ([place]) Area of increased opacity near her posterior right lower lobe fiducial marker.  It looks like scar but difficult to fully interpret.  She is planning to have a repeat CT in November to look for interval change/stability  [employer] Discussed cessation strategies with her today.   Baltazar Apo, MD, PhD 08/21/2019, 11:09 AM [facility] [phone] or if no answer [phone]

## 2019-09-05 ENCOUNTER — Telehealth: Payer: Self-pay

## 2019-09-05 NOTE — Telephone Encounter (Signed)
Please advise 

## 2019-09-05 NOTE — Telephone Encounter (Signed)
Copied from [place] [id number]. Topic: General - Inquiry >> Sep 05, 2019  8:29 AM Mathis Bud wrote: Reason for CRM: Patient is requesting a call back from nurse do to her medication celecoxib (CELEBREX) 400 MG capsule Patient states it makes her have diarrhea, she is wondering if PCP can lower the dose. Patient call back [phone]

## 2019-09-09 ENCOUNTER — Other Ambulatory Visit: Payer: Self-pay | Admitting: Family Medicine

## 2019-09-09 MED ORDER — CELECOXIB 200 MG PO CAPS: 200.0000 mg | ORAL_CAPSULE | Freq: Every day | ORAL | 3 refills | Status: DC

## 2019-09-09 NOTE — Telephone Encounter (Signed)
rx for celebrex 200 mg sent to pharmacy.  celebrex 400 mg d/c'd.

## 2019-09-10 NOTE — Telephone Encounter (Signed)
Lower Dose for Celebrex has already been sent to Pt pharmacy, Pt aware

## 2019-09-27 ENCOUNTER — Telehealth: Payer: Self-pay

## 2019-09-27 ENCOUNTER — Other Ambulatory Visit: Payer: Self-pay | Admitting: Emergency Medicine

## 2019-09-27 DIAGNOSIS — R05 Cough: Secondary | ICD-10-CM | POA: Diagnosis not present

## 2019-09-27 NOTE — Telephone Encounter (Signed)
Left voice mail for daughter to return call. She had called concerning a COVID test for her mother

## 2019-09-27 NOTE — Telephone Encounter (Signed)
Spoke at length with the patient's daughter. She is very concerned that her mother was in contact with her brother who has been hospitalized with COVID. Daughter will make sure that her mother and father get tested. Daughter states he mother has had diarrhea for over a year and her primary care physician has done no lab work in that time. Advised she has an appointment with Dr Sondra Come on November the 19th She asked permission to come with her mother because her mother is not caring for herself correctly at this time. Father has recently been diagnosed with lymphoma and there is some possible denial going on. Reassured daughter encouraged COVID test and will leave a note okaying her to be a visit with her mother.

## 2019-10-22 ENCOUNTER — Other Ambulatory Visit: Payer: Self-pay

## 2019-10-22 ENCOUNTER — Ambulatory Visit (HOSPITAL_COMMUNITY)
Admission: RE | Admit: 2019-10-22 | Discharge: 2019-10-22 | Disposition: A | Discharge status: Home / Self Care | Payer: PPO | Source: Ambulatory Visit | Attending: Radiation Oncology | Admitting: Radiation Oncology

## 2019-10-22 DIAGNOSIS — C3491 Malignant neoplasm of unspecified part of right bronchus or lung: Secondary | ICD-10-CM | POA: Diagnosis not present

## 2019-10-22 DIAGNOSIS — J439 Emphysema, unspecified: Secondary | ICD-10-CM | POA: Diagnosis not present

## 2019-10-24 ENCOUNTER — Encounter: Payer: Self-pay | Admitting: Radiation Oncology

## 2019-10-24 ENCOUNTER — Other Ambulatory Visit: Payer: Self-pay

## 2019-10-24 ENCOUNTER — Inpatient Hospital Stay: Payer: PPO | Attending: Radiation Oncology

## 2019-10-24 ENCOUNTER — Ambulatory Visit
Admission: RE | Admit: 2019-10-24 | Discharge: 2019-10-24 | Disposition: A | Discharge status: Home / Self Care | Payer: PPO | Source: Ambulatory Visit | Attending: Radiation Oncology | Admitting: Radiation Oncology

## 2019-10-24 VITALS — BP 131/64 | HR 79 | Temp 98.5°F | Resp 18 | Ht 65.0 in | Wt 94.1 lb

## 2019-10-24 DIAGNOSIS — Z79899 Other long term (current) drug therapy: Secondary | ICD-10-CM | POA: Insufficient documentation

## 2019-10-24 DIAGNOSIS — R918 Other nonspecific abnormal finding of lung field: Secondary | ICD-10-CM | POA: Diagnosis not present

## 2019-10-24 DIAGNOSIS — Z85118 Personal history of other malignant neoplasm of bronchus and lung: Secondary | ICD-10-CM | POA: Diagnosis not present

## 2019-10-24 DIAGNOSIS — J439 Emphysema, unspecified: Secondary | ICD-10-CM | POA: Insufficient documentation

## 2019-10-24 DIAGNOSIS — Z791 Long term (current) use of non-steroidal anti-inflammatories (NSAID): Secondary | ICD-10-CM | POA: Diagnosis not present

## 2019-10-24 DIAGNOSIS — Z923 Personal history of irradiation: Secondary | ICD-10-CM | POA: Diagnosis not present

## 2019-10-24 DIAGNOSIS — C3491 Malignant neoplasm of unspecified part of right bronchus or lung: Secondary | ICD-10-CM | POA: Insufficient documentation

## 2019-10-24 DIAGNOSIS — Z7982 Long term (current) use of aspirin: Secondary | ICD-10-CM | POA: Insufficient documentation

## 2019-10-24 DIAGNOSIS — Z08 Encounter for follow-up examination after completed treatment for malignant neoplasm: Secondary | ICD-10-CM | POA: Diagnosis not present

## 2019-10-24 LAB — COMPREHENSIVE METABOLIC PANEL
ALT: 16 U/L (ref 0–44)
AST: 22 U/L (ref 15–41)
Albumin: 3.9 g/dL (ref 3.5–5.0)
Alkaline Phosphatase: 88 U/L (ref 38–126)
Anion gap: 10 (ref 5–15)
BUN: 9 mg/dL (ref 8–23)
CO2: 25 mmol/L (ref 22–32)
Calcium: 9 mg/dL (ref 8.9–10.3)
Chloride: 104 mmol/L (ref 98–111)
Creatinine, Ser: 0.72 mg/dL (ref 0.44–1.00)
GFR calc Af Amer: >60 mL/min (ref >60)
GFR calc non Af Amer: >60 mL/min (ref >60)
Glucose, Bld: 83 mg/dL (ref 70–99)
Potassium: 4.2 mmol/L (ref 3.5–5.1)
Sodium: 139 mmol/L (ref 135–145)
Total Bilirubin: 0.3 mg/dL (ref 0.3–1.2)
Total Protein: 6.9 g/dL (ref 6.5–8.1)

## 2019-10-24 LAB — CBC WITH DIFFERENTIAL (CANCER CENTER ONLY)
Abs Immature Granulocytes: 0.01 10*3/uL (ref 0.00–0.07)
Basophils Absolute: 0 10*3/uL (ref 0.0–0.1)
Basophils Relative: 1 %
Eosinophils Absolute: 0 10*3/uL (ref 0.0–0.5)
Eosinophils Relative: 1 %
HCT: 42 % (ref 36.0–46.0)
Hemoglobin: 13.9 g/dL (ref 12.0–15.0)
Immature Granulocytes: 0 %
Lymphocytes Relative: 35 %
Lymphs Abs: 1.4 10*3/uL (ref 0.7–4.0)
MCH: 33.5 pg (ref 26.0–34.0)
MCHC: 33.1 g/dL (ref 30.0–36.0)
MCV: 101.2 fL — ABNORMAL HIGH (ref 80.0–100.0)
Monocytes Absolute: 0.4 10*3/uL (ref 0.1–1.0)
Monocytes Relative: 11 %
Neutro Abs: 2.1 10*3/uL (ref 1.7–7.7)
Neutrophils Relative %: 52 %
Platelet Count: 220 10*3/uL (ref 150–400)
RBC: 4.15 MIL/uL (ref 3.87–5.11)
RDW: 14.6 % (ref 11.5–15.5)
WBC Count: 4 10*3/uL (ref 4.0–10.5)
nRBC: 0 % (ref 0.0–0.2)

## 2019-10-24 LAB — MAGNESIUM: Magnesium: 2.2 mg/dL (ref 1.7–2.4)

## 2019-10-24 NOTE — Progress Notes (Signed)
Radiation Oncology         [phone] ________________________________  Name: Lindsey Rowland MRN: [id number]  Date: 10/24/2019  DOB: 1951/10/23  Follow-Up Visit Note  CC: Billie Ruddy, MD  Collene Gobble, MD    ICD-10-CM   1. Non-small cell cancer of right lung [facility])  C34.91 CBC with Differential ([place] Only)    Comprehensive metabolic panel    CANCELED: Magnesium    Diagnosis:   Non-Small cell lung cancer, clinical stage I-II   Interval Since Last Radiation:  1 year and 1 month 09/10/18, 09/12/18, 09/17/18: Right lung / 54 Gy in 3 fractions (SBRT)  Narrative:  The patient returns today for routine follow-up.   Since her last visit, she underwent repeat chest CT on 10/22/2019. This showed: decreased thickening of soft tissue around fiducial markers in area of post-treatment change, consider follow up in 3 months; areas of most concern are along the right hemidiaphragm in the peripheral chest; borderline enlarged mediastinal lymph node, attention on follow up.  On review of systems, she reports productive cough with clear sputum. She denies hemoptysis, shortness of breath or difficulty breathing, and difficulty swallowing. She also noted arthritis pain in bilateral hips as well as sciatica pain, rated 6/10.   ALLERGIES:  is allergic to amoxicillin.  Meds: Current Outpatient Medications  Medication Sig Dispense Refill  . albuterol (PROAIR HFA) 108 (90 Base) MCG/ACT inhaler Inhale 2 puffs into the lungs every 6 (six) hours as needed for wheezing or shortness of breath. For shortness of breath. 18 g 11  . Aspirin-Salicylamide-Caffeine (BC HEADACHE POWDER PO) Take 1 packet by mouth daily as needed (for pain or headache).     . cyclobenzaprine (FLEXERIL) 10 MG tablet Take 1 tablet (10 mg total) by mouth at bedtime as needed for muscle spasms. 60 tablet 2  . diphenoxylate-atropine (LOMOTIL) 2.5-0.025 MG tablet Take 1 tablet by mouth 4 (four) times daily as needed for  diarrhea or loose stools. 120 tablet 2  . Multiple Vitamin (MULITIVITAMIN WITH MINERALS) TABS Take 1 tablet by mouth daily.    . pantoprazole (PROTONIX) 40 MG tablet TAKE 1 TABLET BY MOUTH EVERY DAY 90 tablet 1  . Polyethyl Glycol-Propyl Glycol (SYSTANE OP) Place 1 drop into both eyes daily as needed (for dry eyes).    . SPIRIVA RESPIMAT 2.5 MCG/ACT AERS INHALE 2 PUFFS BY MOUTH INTO THE LUNGS DAILY 4 g 5  . celecoxib (CELEBREX) 200 MG capsule Take 1 capsule (200 mg total) by mouth daily. (Patient not taking: Reported on 10/24/2019) 30 capsule 3   No current facility-administered medications for this encounter.     Physical Findings: The patient is in no acute distress. Patient is alert and oriented.  height is 5\' 5"  (1.651 m) and weight is 94 lb 2 oz (42.7 kg). Her temporal temperature is 98.5 F (36.9 C). Her blood pressure is 131/64 and her pulse is 79. Her respiration is 18 and oxygen saturation is 99%.  Lungs are clear to auscultation bilaterally. Heart has regular rate and rhythm. No palpable cervical, supraclavicular, or axillary adenopathy. Abdomen soft, non-tender, normal bowel sounds.   Lab Findings: Lab Results  Component Value Date   WBC 4.0 10/24/2019   HGB 13.9 10/24/2019   HCT 42.0 10/24/2019   MCV 101.2 (H) 10/24/2019   PLT 220 10/24/2019    Radiographic Findings: Ct Chest Wo Contrast  Result Date: 10/22/2019 CLINICAL DATA:  History of right lung cancer diagnosed in January of 2019, status  post XRT completed in November of 2019. EXAM: CT CHEST WITHOUT CONTRAST TECHNIQUE: Multidetector CT imaging of the chest was performed following the standard protocol without IV contrast. COMPARISON:  07/19/2019 FINDINGS: Cardiovascular: Moderate calcific atherosclerotic change throughout the thoracic aorta. No signs of aneurysm. Limited assessment of vascular structures in the chest secondary to lack of intravenous contrast. Mediastinum/Nodes: No signs of new nodal enlargement in the  chest with stable appearance of a precarinal lymph node at 8 mm. Lungs/Pleura: Marked emphysema with fiducial markers in the right lower lobe. Soft tissue surrounding these fiducial markers is diminished, difficult to measure accurately due to morphology but with improved appearance in most areas, for instance along the linear tract extending from the upper fiducial marker into the peripheral right chest nodular thickness measures approximately 1.5-2 mm, previously greatest thickness in this location was approximately 3-4 mm on the prior study. Signs of septal thickening at the lung bases elsewhere with similar appearance. Just above the right hemidiaphragm nodular thickening is present that shows no change measuring approximately 9 mm AP dimension (image 127, series 5) Also with similar appearance of juxta pleural thickening along the lateral right chest (image 130, series 5) this measures approximately 6-7 mm. Right apical scarring is unchanged along with changes of subpleural reticulation in the anterior chest. No signs of consolidation or evidence of pleural effusion. Airways are patent. Upper Abdomen: Limited assessment of upper abdominal contents. No acute upper abdominal process. The adrenal glands are normal. Vascular disease tracks into the abdomen. Musculoskeletal: No signs of chest wall mass or acute musculoskeletal process. IMPRESSION: 1. Decreased thickening of soft tissue around fiducial markers in an area of post treatment change, attention on follow-up. Consider additional follow-up in 3 months. Areas that are most concerning currently are along the right hemidiaphragm in the peripheral chest, away from fiducial markers. 2. Signs of emphysema as before. 3. Borderline enlarged mediastinal lymph node, attention on follow-up. 4. No other interval changes. Aortic Atherosclerosis (ICD10-I70.0) and Emphysema (ICD10-J43.9). Electronically Signed   By: Zetta Bills M.D.   On: 10/22/2019 14:57     Impression:  Clinically stable. Recent chest CT shows good response to her SBRT but recommendations were for short interval chest CT scan given the thickening around 1 of the fiducial markers.  Plan:  Patient will have a Chest CT scan scheduled in 3 months and then follow up in Radiation Oncology soon after.  Patient reports some weight loss and poor appetite.  We will order for routine blood work.  She is inquiring about ordering cholesterol but I recommended she have this performed through her primary care physician.  ____________________________________   Blair Promise, PhD, MD   This document serves as a record of services personally performed by Gery Pray, MD. It was created on his behalf by Wilburn Mylar, a trained medical scribe. The creation of this record is based on the scribe's personal observations and the provider's statements to them. This document has been checked and approved by the attending provider.

## 2019-10-24 NOTE — Patient Instructions (Signed)
Coronavirus (COVID-19) Are you at risk?  Are you at risk for the Coronavirus (COVID-19)?  To be considered HIGH RISK for Coronavirus (COVID-19), you have to meet the following criteria:  . Traveled to [place], [place], [place], [place] or [place]; or in the [place] to [place], [place], [place], or [place]; and have fever, cough, and shortness of breath within the last 2 weeks of travel OR . Been in close contact with a person diagnosed with COVID-19 within the last 2 weeks and have fever, cough, and shortness of breath . IF YOU DO NOT MEET THESE CRITERIA, YOU ARE CONSIDERED LOW RISK FOR COVID-19.  What to do if you are HIGH RISK for COVID-19?  . If you are having a medical emergency, call 911. . Seek medical care right away. Before you go to a doctor's office, urgent care or emergency department, call ahead and tell them about your recent travel, contact with someone diagnosed with COVID-19, and your symptoms. You should receive instructions from your physician's office regarding next steps of care.  . When you arrive at healthcare provider, tell the healthcare staff immediately you have returned from visiting [place], [place], [place], [place] or [place]; or traveled in the [place] to [place], [place], [place], or [place]; in the last two weeks or you have been in close contact with a person diagnosed with COVID-19 in the last 2 weeks.   . Tell the health care staff about your symptoms: fever, cough and shortness of breath. . After you have been seen by a medical provider, you will be either: o Tested for (COVID-19) and discharged home on quarantine except to seek medical care if symptoms worsen, and asked to  - Stay home and avoid contact with others until you get your results (4-5 days)  - Avoid travel on public transportation if possible (such as bus, train, or airplane) or o Sent to the Emergency Department by EMS for evaluation, COVID-19 testing, and possible  admission depending on your condition and test results.  What to do if you are LOW RISK for COVID-19?  Reduce your risk of any infection by using the same precautions used for avoiding the common cold or flu:  . Wash your hands often with soap and warm water for at least 20 seconds.  If soap and water are not readily available, use an alcohol-based hand sanitizer with at least 60% alcohol.  . If coughing or sneezing, cover your mouth and nose by coughing or sneezing into the elbow areas of your shirt or coat, into a tissue or into your sleeve (not your hands). . Avoid shaking hands with others and consider head nods or verbal greetings only. . Avoid touching your eyes, nose, or mouth with unwashed hands.  . Avoid close contact with people who are sick. . Avoid places or events with large numbers of people in one location, like concerts or sporting events. . Carefully consider travel plans you have or are making. . If you are planning any travel outside or inside the [place], visit the CDC's Travelers' Health webpage for the latest health notices. . If you have some symptoms but not all symptoms, continue to monitor at home and seek medical attention if your symptoms worsen. . If you are having a medical emergency, call 911.   ADDITIONAL HEALTHCARE OPTIONS FOR PATIENTS  [facility] / e-Visit: [email or web address]         [facility]: [phone]  [facility]: [phone]                   [facility]: [phone]   

## 2019-10-24 NOTE — Progress Notes (Signed)
Pt presents today for f/u with Dr. Sondra Come to review recent CT scan results. Pt reports arthritis pain in bilateral hips and sciatic pain, rated 6/10. Pt reports cough, clear sputum. Pt denies hemoptysis. Pt denies SOB, difficulty breathing. Pt denies difficulty swallowing.   BP 131/64 (BP Location: Left Arm, Patient Position: Sitting)   Pulse 79   Temp 98.5 F (36.9 C) (Temporal)   Resp 18   Ht 5\' 5"  (1.651 m)   Wt 94 lb 2 oz (42.7 kg)   SpO2 99%   BMI 15.66 kg/m   Wt Readings from Last 3 Encounters:  10/24/19 94 lb 2 oz (42.7 kg)  08/21/19 93 lb 12.8 oz (42.5 kg)  07/22/19 96 lb 6 oz (43.7 kg)   Loma Sousa, RN BSN

## 2019-10-30 ENCOUNTER — Other Ambulatory Visit: Payer: Self-pay

## 2019-11-05 DIAGNOSIS — R05 Cough: Secondary | ICD-10-CM | POA: Diagnosis not present

## 2019-11-05 DIAGNOSIS — J441 Chronic obstructive pulmonary disease with (acute) exacerbation: Secondary | ICD-10-CM | POA: Diagnosis not present

## 2019-12-10 DIAGNOSIS — H04123 Dry eye syndrome of bilateral lacrimal glands: Secondary | ICD-10-CM | POA: Diagnosis not present

## 2019-12-10 DIAGNOSIS — H40013 Open angle with borderline findings, low risk, bilateral: Secondary | ICD-10-CM | POA: Diagnosis not present

## 2019-12-10 DIAGNOSIS — Z961 Presence of intraocular lens: Secondary | ICD-10-CM | POA: Diagnosis not present

## 2019-12-10 DIAGNOSIS — H10413 Chronic giant papillary conjunctivitis, bilateral: Secondary | ICD-10-CM | POA: Diagnosis not present

## 2019-12-10 DIAGNOSIS — H31091 Other chorioretinal scars, right eye: Secondary | ICD-10-CM | POA: Diagnosis not present

## 2019-12-10 DIAGNOSIS — Z9889 Other specified postprocedural states: Secondary | ICD-10-CM | POA: Diagnosis not present

## 2019-12-10 DIAGNOSIS — H2512 Age-related nuclear cataract, left eye: Secondary | ICD-10-CM | POA: Diagnosis not present

## 2019-12-29 DIAGNOSIS — J209 Acute bronchitis, unspecified: Secondary | ICD-10-CM | POA: Diagnosis not present

## 2019-12-31 ENCOUNTER — Telehealth: Payer: Self-pay | Admitting: *Deleted

## 2019-12-31 NOTE — Telephone Encounter (Signed)
Called patient to inform that test hasn't been precerted yet, therefore I can't schedule this until it is precerted, patient verified understanding this

## 2020-01-13 ENCOUNTER — Telehealth: Payer: Self-pay | Admitting: *Deleted

## 2020-01-13 NOTE — Telephone Encounter (Signed)
CALLED PATIENT TO INFORM OF CT FOR 01-24-20 - ARRIVAL TIME- 9:15 AM @ [facility], NO RESTRICTIONS TO TEST, PATIENT TO RECEIVE RESULTS FROM DR. kINARD ON 01-27-20 @ 11:30 AM, SPOKE WITH PATIENT AND SHE IS AWARE OF THESE APPTS.

## 2020-01-24 ENCOUNTER — Other Ambulatory Visit: Payer: Self-pay

## 2020-01-24 ENCOUNTER — Ambulatory Visit (HOSPITAL_COMMUNITY)
Admission: RE | Admit: 2020-01-24 | Discharge: 2020-01-24 | Disposition: A | Discharge status: Home / Self Care | Payer: PPO | Source: Ambulatory Visit | Attending: Radiation Oncology | Admitting: Radiation Oncology

## 2020-01-24 DIAGNOSIS — C3491 Malignant neoplasm of unspecified part of right bronchus or lung: Secondary | ICD-10-CM | POA: Insufficient documentation

## 2020-01-24 DIAGNOSIS — C3490 Malignant neoplasm of unspecified part of unspecified bronchus or lung: Secondary | ICD-10-CM | POA: Diagnosis not present

## 2020-01-26 NOTE — Progress Notes (Signed)
Radiation Oncology         [phone] ________________________________  Name: Lindsey Rowland MRN: [id number]  Date: 01/27/2020  DOB: 1951/10/31  Follow-Up Visit Note  CC: Billie Ruddy, MD  Collene Gobble, MD    ICD-10-CM   1. Non-small cell cancer of right lung [facility])  C34.91     Diagnosis: Non-Small cell lung cancer, clinical stage I-II   Interval Since Last Radiation: One year, four months, one week, and one day.  09/10/18, 09/12/18, 09/17/18: Right lung / 54 Gy in 3 fractions (SBRT)  Narrative:  The patient returns today for routine follow-up. She is doing well overall.   Since last visit, the patient had a CT of chest on 01/24/2020. Results showed stable pleuroparenchymal thickening in the right lower lobe adjacent to radiation fiducial markers. No progressive thickening or nodularity to suggest lung cancer recurrence.   On review of systems, she reports no breathing issues. She denies pain within the chest significant cough or hemoptysis.    ALLERGIES:  is allergic to amoxicillin.  Meds: Current Outpatient Medications  Medication Sig Dispense Refill  . albuterol (PROAIR HFA) 108 (90 Base) MCG/ACT inhaler Inhale 2 puffs into the lungs every 6 (six) hours as needed for wheezing or shortness of breath. For shortness of breath. 18 g 11  . Aspirin-Salicylamide-Caffeine (BC HEADACHE POWDER PO) Take 1 packet by mouth daily as needed (for pain or headache).     . diphenoxylate-atropine (LOMOTIL) 2.5-0.025 MG tablet Take 1 tablet by mouth 4 (four) times daily as needed for diarrhea or loose stools. 120 tablet 2  . Multiple Vitamin (MULITIVITAMIN WITH MINERALS) TABS Take 1 tablet by mouth daily.    . pantoprazole (PROTONIX) 40 MG tablet TAKE 1 TABLET BY MOUTH EVERY DAY 90 tablet 1  . Polyethyl Glycol-Propyl Glycol (SYSTANE OP) Place 1 drop into both eyes daily as needed (for dry eyes).    . SPIRIVA RESPIMAT 2.5 MCG/ACT AERS INHALE 2 PUFFS BY MOUTH INTO THE LUNGS DAILY 4 g 5    . celecoxib (CELEBREX) 200 MG capsule Take 1 capsule (200 mg total) by mouth daily. (Patient not taking: Reported on 10/24/2019) 30 capsule 3  . cyclobenzaprine (FLEXERIL) 10 MG tablet Take 1 tablet (10 mg total) by mouth at bedtime as needed for muscle spasms. (Patient not taking: Reported on 01/27/2020) 60 tablet 2   No current facility-administered medications for this encounter.    Physical Findings: The patient is in no acute distress. Patient is alert and oriented.  weight is 95 lb (43.1 kg). Her temperature is 98.3 F (36.8 C). Her blood pressure is 140/68 and her pulse is 109 (abnormal). Her respiration is 20 and oxygen saturation is 97%.  Lungs are clear to auscultation bilaterally. Heart has regular rate and rhythm. No palpable cervical, supraclavicular, or axillary adenopathy. Abdomen soft, non-tender, normal bowel sounds.   Lab Findings: Lab Results  Component Value Date   WBC 4.0 10/24/2019   HGB 13.9 10/24/2019   HCT 42.0 10/24/2019   MCV 101.2 (H) 10/24/2019   PLT 220 10/24/2019    Radiographic Findings: CT Chest Wo Contrast  Result Date: 01/24/2020 CLINICAL DATA:  Lung cancer follow-up. Surgeon radiation radiation complete 10/2018. EXAM: CT CHEST WITHOUT CONTRAST TECHNIQUE: Multidetector CT imaging of the chest was performed following the standard protocol without IV contrast. COMPARISON:  Chest CT 10/22/2019 FINDINGS: Cardiovascular: Coronary artery calcification and aortic atherosclerotic calcification. Mediastinum/Nodes: No axillary supraclavicular adenopathy. No mediastinal hilar adenopathy. No pericardial effusion. Esophagus normal.  Lungs/Pleura: Diffuse centrilobular emphysema. In the RIGHT lower lobe a pleural thickening adjacent to fiducial markers is this similar. For example, thickening just above the diaphragm measures 6 mm (image 126/5) compared to 9 mm. No new thickening or nodularity. Subpleural reticulation in the RIGHT lower lobe also unchanged. No  suspicious nodules elsewhere in the lungs. Upper Abdomen: Limited view of the liver, kidneys, pancreas are unremarkable. Normal adrenal glands. Musculoskeletal: No aggressive osseous lesion IMPRESSION: 1. Stable pleuroparenchymal thickening in the RIGHT lower lobe adjacent to radiation fiducial markers. NO progressive thickening or nodularity to suggest lung cancer recurrence. 2. Extensive centrilobular emphysema. 3. No lymphadenopathy. 4. Coronary artery calcification and Aortic Atherosclerosis (ICD10-I70.0). Electronically Signed   By: Suzy Bouchard M.D.   On: 01/24/2020 09:53    Impression: Non-Small cell lung cancer, clinical stage I-II   Clinically, the patient is stable at this time.  Recent chest CT scan shows no new problems or evidence of recurrence.  Plan: Patient will follow-up with radiation oncology in six months.  Prior to this follow-up she will have another CT scan of the chest.  ____________________________________   Blair Promise, PhD, MD   This document serves as a record of services personally performed by Gery Pray, MD. It was created on his behalf by Clerance Lav, a trained medical scribe. The creation of this record is based on the scribe's personal observations and the provider's statements to them. This document has been checked and approved by the attending provider.

## 2020-01-27 ENCOUNTER — Encounter: Payer: Self-pay | Admitting: Radiation Oncology

## 2020-01-27 ENCOUNTER — Other Ambulatory Visit: Payer: Self-pay

## 2020-01-27 ENCOUNTER — Ambulatory Visit
Admission: RE | Admit: 2020-01-27 | Discharge: 2020-01-27 | Disposition: A | Discharge status: Home / Self Care | Payer: PPO | Source: Ambulatory Visit | Attending: Radiation Oncology | Admitting: Radiation Oncology

## 2020-01-27 VITALS — BP 140/68 | HR 109 | Temp 98.3°F | Resp 20 | Wt 95.0 lb

## 2020-01-27 DIAGNOSIS — Z85118 Personal history of other malignant neoplasm of bronchus and lung: Secondary | ICD-10-CM | POA: Diagnosis not present

## 2020-01-27 DIAGNOSIS — Z923 Personal history of irradiation: Secondary | ICD-10-CM | POA: Diagnosis not present

## 2020-01-27 DIAGNOSIS — C3491 Malignant neoplasm of unspecified part of right bronchus or lung: Secondary | ICD-10-CM | POA: Insufficient documentation

## 2020-01-27 DIAGNOSIS — I700 Atherosclerosis of aorta: Secondary | ICD-10-CM | POA: Diagnosis not present

## 2020-01-27 DIAGNOSIS — J984 Other disorders of lung: Secondary | ICD-10-CM | POA: Diagnosis not present

## 2020-01-27 DIAGNOSIS — Z79899 Other long term (current) drug therapy: Secondary | ICD-10-CM | POA: Diagnosis not present

## 2020-01-27 DIAGNOSIS — Z08 Encounter for follow-up examination after completed treatment for malignant neoplasm: Secondary | ICD-10-CM | POA: Diagnosis not present

## 2020-01-27 NOTE — Patient Instructions (Signed)
Coronavirus (COVID-19) Are you at risk?  Are you at risk for the Coronavirus (COVID-19)?  To be considered HIGH RISK for Coronavirus (COVID-19), you have to meet the following criteria:  . Traveled to [place], [place], [place], [place] or [place]; or in the [place] to [place], [place], [place], or [place]; and have fever, cough, and shortness of breath within the last 2 weeks of travel OR . Been in close contact with a person diagnosed with COVID-19 within the last 2 weeks and have fever, cough, and shortness of breath . IF YOU DO NOT MEET THESE CRITERIA, YOU ARE CONSIDERED LOW RISK FOR COVID-19.  What to do if you are HIGH RISK for COVID-19?  . If you are having a medical emergency, call 911. . Seek medical care right away. Before you go to a doctor's office, urgent care or emergency department, call ahead and tell them about your recent travel, contact with someone diagnosed with COVID-19, and your symptoms. You should receive instructions from your physician's office regarding next steps of care.  . When you arrive at healthcare provider, tell the healthcare staff immediately you have returned from visiting [place], [place], [place], [place] or [place]; or traveled in the [place] to [place], [place], [place], or [place]; in the last two weeks or you have been in close contact with a person diagnosed with COVID-19 in the last 2 weeks.   . Tell the health care staff about your symptoms: fever, cough and shortness of breath. . After you have been seen by a medical provider, you will be either: o Tested for (COVID-19) and discharged home on quarantine except to seek medical care if symptoms worsen, and asked to  - Stay home and avoid contact with others until you get your results (4-5 days)  - Avoid travel on public transportation if possible (such as bus, train, or airplane) or o Sent to the Emergency Department by EMS for evaluation, COVID-19 testing, and possible  admission depending on your condition and test results.  What to do if you are LOW RISK for COVID-19?  Reduce your risk of any infection by using the same precautions used for avoiding the common cold or flu:  . Wash your hands often with soap and warm water for at least 20 seconds.  If soap and water are not readily available, use an alcohol-based hand sanitizer with at least 60% alcohol.  . If coughing or sneezing, cover your mouth and nose by coughing or sneezing into the elbow areas of your shirt or coat, into a tissue or into your sleeve (not your hands). . Avoid shaking hands with others and consider head nods or verbal greetings only. . Avoid touching your eyes, nose, or mouth with unwashed hands.  . Avoid close contact with people who are sick. . Avoid places or events with large numbers of people in one location, like concerts or sporting events. . Carefully consider travel plans you have or are making. . If you are planning any travel outside or inside the [place], visit the CDC's Travelers' Health webpage for the latest health notices. . If you have some symptoms but not all symptoms, continue to monitor at home and seek medical attention if your symptoms worsen. . If you are having a medical emergency, call 911.   ADDITIONAL HEALTHCARE OPTIONS FOR PATIENTS  [facility] / e-Visit: [email or web address]         [facility]: [phone]  [facility]: [phone]                   [facility]: [phone]   

## 2020-01-27 NOTE — Progress Notes (Signed)
Ms. Goodall presents today for f/u with Dr. Sondra Come and to review recent CT results. Pt reports recent ear infection, has since resolved. Pt reports occasional cough with clear phlegm. Pt denies hemoptysis. Pt reports occasional SOB with exertion. Pt denies difficulty swallowing.   BP 140/68 (BP Location: Left Arm, Patient Position: Sitting, Cuff Size: Normal)   Pulse (!) 109   Temp 98.3 F (36.8 C)   Resp 20   Wt 95 lb (43.1 kg)   SpO2 97%   BMI 15.81 kg/m   Wt Readings from Last 3 Encounters:  01/27/20 95 lb (43.1 kg)  10/24/19 94 lb 2 oz (42.7 kg)  08/21/19 93 lb 12.8 oz (42.5 kg)   Loma Sousa, RN BSN

## 2020-02-27 ENCOUNTER — Other Ambulatory Visit: Payer: Self-pay | Admitting: Family Medicine

## 2020-05-21 ENCOUNTER — Other Ambulatory Visit: Payer: Self-pay | Admitting: Family Medicine

## 2020-05-26 DIAGNOSIS — Z1231 Encounter for screening mammogram for malignant neoplasm of breast: Secondary | ICD-10-CM | POA: Diagnosis not present

## 2020-06-05 DIAGNOSIS — R918 Other nonspecific abnormal finding of lung field: Secondary | ICD-10-CM | POA: Diagnosis not present

## 2020-06-05 DIAGNOSIS — R0781 Pleurodynia: Secondary | ICD-10-CM | POA: Diagnosis not present

## 2020-06-05 DIAGNOSIS — J929 Pleural plaque without asbestos: Secondary | ICD-10-CM | POA: Diagnosis not present

## 2020-06-05 DIAGNOSIS — J449 Chronic obstructive pulmonary disease, unspecified: Secondary | ICD-10-CM | POA: Diagnosis not present

## 2020-06-05 DIAGNOSIS — I700 Atherosclerosis of aorta: Secondary | ICD-10-CM | POA: Diagnosis not present

## 2020-07-01 ENCOUNTER — Other Ambulatory Visit: Payer: Self-pay | Admitting: Family Medicine

## 2020-07-01 ENCOUNTER — Telehealth: Payer: Self-pay | Admitting: Family Medicine

## 2020-07-01 DIAGNOSIS — M1990 Unspecified osteoarthritis, unspecified site: Secondary | ICD-10-CM

## 2020-07-01 NOTE — Telephone Encounter (Signed)
Patient is calling and wanted to [facility] from Dr. Volanda Napoleon to Dr. Ethelene Hal, please advise. CB is [phone]

## 2020-07-02 NOTE — Telephone Encounter (Signed)
Ok

## 2020-07-07 ENCOUNTER — Other Ambulatory Visit: Payer: Self-pay | Admitting: Family Medicine

## 2020-07-08 ENCOUNTER — Ambulatory Visit (INDEPENDENT_AMBULATORY_CARE_PROVIDER_SITE_OTHER): Payer: PPO | Admitting: Family Medicine

## 2020-07-08 ENCOUNTER — Encounter: Payer: Self-pay | Admitting: Family Medicine

## 2020-07-08 ENCOUNTER — Other Ambulatory Visit: Payer: Self-pay

## 2020-07-08 VITALS — BP 118/76 | HR 92 | Temp 98.1°F | Wt 91.4 lb

## 2020-07-08 DIAGNOSIS — M1990 Unspecified osteoarthritis, unspecified site: Secondary | ICD-10-CM

## 2020-07-08 DIAGNOSIS — C3491 Malignant neoplasm of unspecified part of right bronchus or lung: Secondary | ICD-10-CM

## 2020-07-08 DIAGNOSIS — F17210 Nicotine dependence, cigarettes, uncomplicated: Secondary | ICD-10-CM

## 2020-07-08 DIAGNOSIS — R630 Anorexia: Secondary | ICD-10-CM | POA: Diagnosis not present

## 2020-07-08 DIAGNOSIS — Z8612 Personal history of poliomyelitis: Secondary | ICD-10-CM | POA: Diagnosis not present

## 2020-07-08 DIAGNOSIS — G2581 Restless legs syndrome: Secondary | ICD-10-CM

## 2020-07-08 MED ORDER — CYCLOBENZAPRINE HCL 10 MG PO TABS: 10.0000 mg | ORAL_TABLET | Freq: Every evening | ORAL | 2 refills | Status: DC | PRN

## 2020-07-08 MED ORDER — CELECOXIB 200 MG PO CAPS: 200.0000 mg | ORAL_CAPSULE | Freq: Every day | ORAL | 3 refills | Status: DC

## 2020-07-08 NOTE — Patient Instructions (Addendum)
Arthritis Arthritis is a term that is commonly used to refer to joint pain or joint disease. There are more than 100 types of arthritis. What are the causes? The most common cause of this condition is wear and tear of a joint. Other causes include:  Gout.  Inflammation of a joint.  An infection of a joint.  Sprains and other injuries near the joint.  A reaction to medicines or drugs, or an allergic reaction. In some cases, the cause may not be known. What are the signs or symptoms? The main symptom of this condition is pain in the joint during movement. Other symptoms include:  Redness, swelling, or stiffness at a joint.  Warmth coming from the joint.  Fever.  Overall feeling of illness. How is this diagnosed? This condition may be diagnosed with a physical exam and tests, including:  Blood tests.  Urine tests.  Imaging tests, such as X-rays, an MRI, or a CT scan. Sometimes, fluid is removed from a joint for testing. How is this treated? This condition may be treated with:  Treatment of the cause, if it is known.  Rest.  Raising (elevating) the joint.  Applying cold or hot packs to the joint.  Medicines to improve symptoms and reduce inflammation.  Injections of a steroid such as cortisone into the joint to help reduce pain and inflammation. Depending on the cause of your arthritis, you may need to make lifestyle changes to reduce stress on your joint. Changes may include:  Exercising more.  Losing weight. Follow these instructions at home: Medicines  Take over-the-counter and prescription medicines only as told by your health care provider.  Do not take aspirin to relieve pain if your health care provider thinks that gout may be causing your pain. Activity  Rest your joint if told by your health care provider. Rest is important when your disease is active and your joint feels painful, swollen, or stiff.  Avoid activities that make the pain worse. It is  important to balance activity with rest.  Exercise your joint regularly with range-of-motion exercises as told by your health care provider. Try doing low-impact exercise, such as: ? Swimming. ? Water aerobics. ? Biking. ? Walking. Managing pain, stiffness, and swelling      If directed, put ice on the joint. ? Put ice in a plastic bag. ? Place a towel between your skin and the bag. ? Leave the ice on for 20 minutes, 2-3 times per day.  If your joint is swollen, raise (elevate) it above the level of your heart if directed by your health care provider.  If your joint feels stiff in the morning, try taking a warm shower.  If directed, apply heat to the affected area as often as told by your health care provider. Use the heat source that your health care provider recommends, such as a moist heat pack or a heating pad. If you have diabetes, do not apply heat without permission from your health care provider. To apply heat: ? Place a towel between your skin and the heat source. ? Leave the heat on for 20-30 minutes. ? Remove the heat if your skin turns bright red. This is especially important if you are unable to feel pain, heat, or cold. You may have a greater risk of getting burned. General instructions  Do not use any products that contain nicotine or tobacco, such as cigarettes, e-cigarettes, and chewing tobacco. If you need help quitting, ask your health care provider.  Keep  all follow-up visits as told by your health care provider. This is important. Contact a health care provider if:  The pain gets worse.  You have a fever. Get help right away if:  You develop severe joint pain, swelling, or redness.  Many joints become painful and swollen.  You develop severe back pain.  You develop severe weakness in your leg.  You cannot control your bladder or bowels. Summary  Arthritis is a term that is commonly used to refer to joint pain or joint disease. There are more than  100 types of arthritis.  The most common cause of this condition is wear and tear of a joint. Other causes include gout, inflammation or infection of the joint, sprains, or allergies.  Symptoms of this condition include redness, swelling, or stiffness of the joint. Other symptoms include warmth, fever, or feeling ill.  This condition is treated with rest, elevation, medicines, and applying cold or hot packs.  Follow your health care provider's instructions about medicines, activity, exercises, and other home care treatments. This information is not intended to replace advice given to you by your health care provider. Make sure you discuss any questions you have with your health care provider. Document Revised: 10/29/2018 Document Reviewed: 10/29/2018 Elsevier Patient Education  [place].  High-Protein and High-Calorie Diet Eating high-protein and high-calorie foods can help you to gain weight, heal after an injury, and recover after an illness or surgery. The specific amount of daily protein and calories you need depends on:  Your body weight.  The reason this diet is recommended for you. What is my plan? Generally, a high-protein, high-calorie diet involves:  Eating 250-500 extra calories each day.  Making sure that you get enough of your daily calories from protein. Ask your health care provider how many of your calories should come from protein. Talk with a health care provider, such as a diet and nutrition specialist (dietitian), about how much protein and how many calories you need each day. Follow the diet as directed by your health care provider. What are tips for following this plan?  Preparing meals  Add whole milk, half-and-half, or heavy cream to cereal, pudding, soup, or hot cocoa.  Add whole milk to instant breakfast drinks.  Add peanut butter to oatmeal or smoothies.  Add powdered milk to baked goods, smoothies, or milkshakes.  Add powdered milk, cream,  or butter to mashed potatoes.  Add cheese to cooked vegetables.  Make whole-milk yogurt parfaits. Top them with granola, fruit, or nuts.  Add cottage cheese to your fruit.  Add avocado, cheese, or both to sandwiches or salads.  Add meat, poultry, or seafood to rice, pasta, casseroles, salads, and soups.  Use mayonnaise when making egg salad, chicken salad, or tuna salad.  Use peanut butter as a dip for vegetables or as a topping for pretzels, celery, or crackers.  Add beans to casseroles, dips, and spreads.  Add pureed beans to sauces and soups.  Replace calorie-free drinks with calorie-containing drinks, such as milk and fruit juice.  Replace water with milk or heavy cream when making foods such as oatmeal, pudding, or cocoa. General instructions  Ask your health care provider if you should take a nutritional supplement.  Try to eat six small meals each day instead of three large meals.  Eat a balanced diet. In each meal, include one food that is high in protein.  Keep nutritious snacks available, such as nuts, trail mixes, dried fruit, and yogurt.  If  you have kidney disease or diabetes, talk with your health care provider about how much protein is safe for you. Too much protein may put extra stress on your kidneys.  Drink your calories. Choose high-calorie drinks and have them after your meals. What high-protein foods should I eat?  Vegetables Soybeans. Peas. Grains Quinoa. Bulgur wheat. Meats and other proteins Beef, pork, and poultry. Fish and seafood. Eggs. Tofu. Textured vegetable protein (TVP). Peanut butter. Nuts and seeds. Dried beans. Protein powders. Dairy Whole milk. Whole-milk yogurt. Powdered milk. Cheese. [employer]. Eggnog. Beverages High-protein supplement drinks. Soy milk. Other foods Protein bars. The items listed above may not be a complete list of high-protein foods and beverages. Contact a dietitian for more options. What high-calorie  foods should I eat? Fruits Dried fruit. Fruit leather. Canned fruit in syrup. Fruit juice. Avocado. Vegetables Vegetables cooked in oil or butter. Fried potatoes. Grains Pasta. Quick breads. Muffins. Pancakes. Ready-to-eat cereal. Meats and other proteins Peanut butter. Nuts and seeds. Dairy Heavy cream. Whipped cream. Cream cheese. Sour cream. Ice cream. Custard. Pudding. Beverages Meal-replacement beverages. Nutrition shakes. Fruit juice. Sugar-sweetened soft drinks. Seasonings and condiments Salad dressing. Mayonnaise. Alfredo sauce. Fruit preserves or jelly. Honey. Syrup. Sweets and desserts Cake. Cookies. Pie. Pastries. Candy bars. Chocolate. Fats and oils Butter or margarine. Oil. Gravy. Other foods Meal-replacement bars. The items listed above may not be a complete list of high-calorie foods and beverages. Contact a dietitian for more options. Summary  A high-protein, high-calorie diet can help you gain weight or heal faster after an injury, illness, or surgery.  To increase your protein and calories, add ingredients such as whole milk, peanut butter, cheese, beans, meat, or seafood to meal items.  To get enough extra calories each day, include high-calorie foods and beverages at each meal.  Adding a high-calorie drink or shake can be an easy way to help you get enough calories each day. Talk with your healthcare provider or dietitian about the best options for you. This information is not intended to replace advice given to you by your health care provider. Make sure you discuss any questions you have with your health care provider. Document Revised: 11/03/2017 Document Reviewed: 10/03/2017 Elsevier Patient Education  2020 [employer].  Steps to Quit Smoking Smoking tobacco is the leading cause of preventable death. It can affect almost every organ in the body. Smoking puts you and those around you at risk for developing many serious chronic diseases. Quitting smoking  can be difficult, but it is one of the best things that you can do for your health. It is never too late to quit. How do I get ready to quit? When you decide to quit smoking, create a plan to help you succeed. Before you quit:  Pick a date to quit. Set a date within the next 2 weeks to give you time to prepare.  Write down the reasons why you are quitting. Keep this list in places where you will see it often.  Tell your family, friends, and co-workers that you are quitting. Support from your loved ones can make quitting easier.  Talk with your health care provider about your options for quitting smoking.  Find out what treatment options are covered by your health insurance.  Identify people, places, things, and activities that make you want to smoke (triggers). Avoid them. What first steps can I take to quit smoking?  Throw away all cigarettes at home, at work, and in your car.  Throw away smoking  accessories, such as Scientist, research (medical).  Clean your car. Make sure to empty the ashtray.  Clean your home, including curtains and carpets. What strategies can I use to quit smoking? Talk with your health care provider about combining strategies, such as taking medicines while you are also receiving in-person counseling. Using these two strategies together makes you more likely to succeed in quitting than if you used either strategy on its own.  If you are pregnant or breastfeeding, talk with your health care provider about finding counseling or other support strategies to quit smoking. Do not take medicine to help you quit smoking unless your health care provider tells you to do so. To quit smoking: Quit right away  Quit smoking completely, instead of gradually reducing how much you smoke over a period of time. Research shows that stopping smoking right away is more successful than gradually quitting.  Attend in-person counseling to help you build problem-solving skills. You are more  likely to succeed in quitting if you attend counseling sessions regularly. Even short sessions of 10 minutes can be effective. Take medicine You may take medicines to help you quit smoking. Some medicines require a prescription and some you can purchase over-the-counter. Medicines may have nicotine in them to replace the nicotine in cigarettes. Medicines may:  Help to stop cravings.  Help to relieve withdrawal symptoms. Your health care provider may recommend:  Nicotine patches, gum, or lozenges.  Nicotine inhalers or sprays.  Non-nicotine medicine that is taken by mouth. Find resources Find resources and support systems that can help you to quit smoking and remain smoke-free after you quit. These resources are most helpful when you use them often. They include:  Online chats with a Social worker.  Telephone quitlines.  Printed Furniture conservator/restorer.  Support groups or group counseling.  Text messaging programs.  Mobile phone apps or applications. Use apps that can help you stick to your quit plan by providing reminders, tips, and encouragement. There are many free apps for mobile devices as well as websites. Examples include Quit Guide from the [employer] and smokefree.gov What things can I do to make it easier to quit?   Reach out to your family and friends for support and encouragement. Call telephone quitlines (1-800-QUIT-NOW), reach out to support groups, or work with a counselor for support.  Ask people who smoke to avoid smoking around you.  Avoid places that trigger you to smoke, such as bars, parties, or smoke-break areas at work.  Spend time with people who do not smoke.  Lessen the stress in your life. Stress can be a smoking trigger for some people. To lessen stress, try: ? Exercising regularly. ? Doing deep-breathing exercises. ? Doing yoga. ? Meditating. ? Performing a body scan. This involves closing your eyes, scanning your body from head to toe, and noticing which  parts of your body are particularly tense. Try to relax the muscles in those areas. How will I feel when I quit smoking? Day 1 to 3 weeks Within the first 24 hours of quitting smoking, you may start to feel withdrawal symptoms. These symptoms are usually most noticeable 2-3 days after quitting, but they usually do not last for more than 2-3 weeks. You may experience these symptoms:  Mood swings.  Restlessness, anxiety, or irritability.  Trouble concentrating.  Dizziness.  Strong cravings for sugary foods and nicotine.  Mild weight gain.  Constipation.  Nausea.  Coughing or a sore throat.  Changes in how the medicines that you take for  unrelated issues work in Veterinary surgeon.  Depression.  Trouble sleeping (insomnia). Week 3 and afterward After the first 2-3 weeks of quitting, you may start to notice more positive results, such as:  Improved sense of smell and taste.  Decreased coughing and sore throat.  Slower heart rate.  Lower blood pressure.  Clearer skin.  The ability to breathe more easily.  Fewer sick days. Quitting smoking can be very challenging. Do not get discouraged if you are not successful the first time. Some people need to make many attempts to quit before they achieve long-term success. Do your best to stick to your quit plan, and talk with your health care provider if you have any questions or concerns. Summary  Smoking tobacco is the leading cause of preventable death. Quitting smoking is one of the best things that you can do for your health.  When you decide to quit smoking, create a plan to help you succeed.  Quit smoking right away, not slowly over a period of time.  When you start quitting, seek help from your health care provider, family, or friends. This information is not intended to replace advice given to you by your health care provider. Make sure you discuss any questions you have with your health care provider. Document Revised:  08/16/2019 Document Reviewed: 02/09/2019 Elsevier Patient Education  [place].

## 2020-07-08 NOTE — Progress Notes (Signed)
Subjective:    Patient ID: Lindsey Rowland, female    DOB: 10-05-1951, 69 y.o.   MRN: [id number]  No chief complaint on file.   HPI Pt was seen today for med refill.  Pt states she needs a refill on Flexeril Celebrex.  Pt endorses a history of bilateral hip pain status post polio.  Pt has been out of medication times several weeks notes increased pain.  Pt also with decreased appetite.  Tries to snack, may eat a sandwich for lunch, and tries to have a larger dinner.  Tried supplemental shakes in the past.  Smoking 1 to 2 cigarettes/day.  States will take a few puffs and pick up the cigarette later in the day.  Pt has tried Chantix in the past but it did not work and the Nicorette gum caused nausea..  Pt would like to transfer care to [facility] office as it is closer to home.  Currently driving 40 miles to this office.  [place] office will be 20 miles away.  Pt mentions she thougt last OFV was for annual wellness visit.  Per chart review visit from 01/2019 was for [facility] from Dr. Raliegh Rowland to this provider.  Past Medical History:  Diagnosis Date  . Arthritis   . Cancer [facility])    "pre-cervical" cells  - age 89  . COPD (chronic obstructive pulmonary disease) ([place])   . Headache(784.0)   . Osteopenia   . Polio    as a child  . Ulcer    stomach hx    Allergies  Allergen Reactions  . Amoxicillin Diarrhea and Itching    ROS General: Denies fever, chills, night sweats +changes in weight, changes in appetite HEENT: Denies headaches, ear pain, changes in vision, rhinorrhea, sore throat CV: Denies CP, palpitations, SOB, orthopnea Pulm: Denies SOB, cough, wheezing GI: Denies abdominal pain, nausea, vomiting, diarrhea, constipation GU: Denies dysuria, hematuria, frequency, vaginal discharge Msk: Denies muscle cramps   +joint pains -hips, RLS Neuro: Denies weakness, numbness, tingling Skin: Denies rashes, bruising Psych: Denies depression, anxiety, hallucinations     Objective:    Blood pressure  118/76, pulse 92, temperature 98.1 F (36.7 C), temperature source Oral, weight 91 lb 6.4 oz (41.5 kg), SpO2 96 %.  Gen. Pleasant, thin, in no distress, normal affect   HEENT: Lindsey Rowland/AT, face symmetric, conjunctiva clear, no scleral icterus, PERRLA, EOMI, nares patent without drainage, pharynx without erythema or exudate.  TMs normal bilaterally. Lungs: no accessory muscle use, CTAB, no wheezes or rales Cardiovascular: RRR, no m/r/g, no peripheral edema Musculoskeletal: No deformities, no cyanosis or clubbing, normal tone Neuro:  A&Ox3, CN II-XII intact, normal gait Skin:  Warm, no lesions/ rash   Wt Readings from Last 3 Encounters:  01/27/20 95 lb (43.1 kg)  10/24/19 94 lb 2 oz (42.7 kg)  08/21/19 93 lb 12.8 oz (42.5 kg)    Lab Results  Component Value Date   WBC 4.0 10/24/2019   HGB 13.9 10/24/2019   HCT 42.0 10/24/2019   PLT 220 10/24/2019   GLUCOSE 83 10/24/2019   CHOL 183 01/22/2018   TRIG 136.0 01/22/2018   HDL 51.60 01/22/2018   LDLDIRECT 146.0 10/07/2013   LDLCALC 104 (H) 01/22/2018   ALT 16 10/24/2019   AST 22 10/24/2019   NA 139 10/24/2019   K 4.2 10/24/2019   CL 104 10/24/2019   CREATININE 0.72 10/24/2019   BUN 9 10/24/2019   CO2 25 10/24/2019   TSH 0.59 07/19/2017   INR 0.97 07/30/2018  Assessment/Plan:  POLIOMYELITIS, HX OF  -Because residual joint pain -Advised to take medication with food -Monitor for bleeding - Plan: celecoxib (CELEBREX) 200 MG capsule  Restless leg syndrome  - Plan: cyclobenzaprine (FLEXERIL) 10 MG tablet  Arthritis  - Plan: celecoxib (CELEBREX) 200 MG capsule  Non-small cell cancer of right lung (HCC) -Smoking cessation encouraged.  Pt working on quitting. -follow up CT chest scheduled 07/24/20 -Continue follow-up with oncology,  Dr. Donnamae Rowland Onc  Decreased appetite -likely 2/2 h/o lung cancer -Discussed eating small meals throughout the day and snacks -Also consider restarting supplemental drinks/milkshakes -Given  handout  Cigarette nicotine dependence without complication -Smoking cessation counseling greater than 3 minutes, less than 10 minutes -Patient currently smoking 1-2 cigarettes/day -Discussed continuing to cut down -Offered medication options such as nicotine patch.  Patient to consider -Continue to monitor -Given handout  F/u prn in the next few months.    Per chart review message was sent on 7/28 in regards to transfer of care, ok with this provider, awaiting response from [place] office.Lindsey Mitts, MD

## 2020-07-23 NOTE — Telephone Encounter (Signed)
Okay 

## 2020-07-24 ENCOUNTER — Ambulatory Visit (HOSPITAL_COMMUNITY)
Admission: RE | Admit: 2020-07-24 | Discharge: 2020-07-24 | Disposition: A | Discharge status: Home / Self Care | Payer: PPO | Source: Ambulatory Visit | Attending: Radiation Oncology | Admitting: Radiation Oncology

## 2020-07-24 ENCOUNTER — Other Ambulatory Visit: Payer: Self-pay

## 2020-07-24 DIAGNOSIS — J432 Centrilobular emphysema: Secondary | ICD-10-CM | POA: Diagnosis not present

## 2020-07-24 DIAGNOSIS — I2510 Atherosclerotic heart disease of native coronary artery without angina pectoris: Secondary | ICD-10-CM | POA: Diagnosis not present

## 2020-07-24 DIAGNOSIS — C3491 Malignant neoplasm of unspecified part of right bronchus or lung: Secondary | ICD-10-CM | POA: Diagnosis not present

## 2020-07-24 DIAGNOSIS — I700 Atherosclerosis of aorta: Secondary | ICD-10-CM | POA: Diagnosis not present

## 2020-07-24 DIAGNOSIS — C3490 Malignant neoplasm of unspecified part of unspecified bronchus or lung: Secondary | ICD-10-CM | POA: Diagnosis not present

## 2020-07-25 ENCOUNTER — Telehealth: Payer: Self-pay

## 2020-07-25 NOTE — Telephone Encounter (Signed)
Prior Authorization was sent for pt Rx Cyclobenzaprine ( Flexeril 10 mg). Awaiting approval. Pt notified

## 2020-07-27 ENCOUNTER — Ambulatory Visit
Admission: RE | Admit: 2020-07-27 | Discharge: 2020-07-27 | Disposition: A | Discharge status: Home / Self Care | Payer: PPO | Source: Ambulatory Visit | Attending: Radiation Oncology | Admitting: Radiation Oncology

## 2020-07-27 ENCOUNTER — Other Ambulatory Visit: Payer: Self-pay

## 2020-07-27 ENCOUNTER — Encounter: Payer: Self-pay | Admitting: Radiation Oncology

## 2020-07-27 VITALS — BP 141/65 | HR 80 | Temp 97.8°F | Resp 18 | Ht 65.0 in | Wt 93.6 lb

## 2020-07-27 DIAGNOSIS — Z08 Encounter for follow-up examination after completed treatment for malignant neoplasm: Secondary | ICD-10-CM | POA: Diagnosis not present

## 2020-07-27 DIAGNOSIS — Z923 Personal history of irradiation: Secondary | ICD-10-CM | POA: Diagnosis not present

## 2020-07-27 DIAGNOSIS — Z79899 Other long term (current) drug therapy: Secondary | ICD-10-CM | POA: Insufficient documentation

## 2020-07-27 DIAGNOSIS — I700 Atherosclerosis of aorta: Secondary | ICD-10-CM | POA: Insufficient documentation

## 2020-07-27 DIAGNOSIS — Z85118 Personal history of other malignant neoplasm of bronchus and lung: Secondary | ICD-10-CM | POA: Insufficient documentation

## 2020-07-27 DIAGNOSIS — J439 Emphysema, unspecified: Secondary | ICD-10-CM | POA: Diagnosis not present

## 2020-07-27 DIAGNOSIS — C3491 Malignant neoplasm of unspecified part of right bronchus or lung: Secondary | ICD-10-CM

## 2020-07-27 NOTE — Progress Notes (Signed)
Radiation Oncology         [phone] ________________________________  Name: Lindsey Rowland MRN: [id number]  Date: 07/27/2020  DOB: 08-10-1951  Follow-Up Visit Note  CC: Lindsey Ruddy, MD  Lindsey Ruddy, MD    ICD-10-CM   1. Non-small cell cancer of right lung [facility])  C34.91 CT Chest Wo Contrast    Diagnosis: Non-Small cell lung cancer, clinical stage I-II   Interval Since Last Radiation: One year, ten months, one week, and two days.  09/10/2018, 09/12/2018, 09/17/2018: Right lung / 54 Gy in 3 fractions (SBRT)  Narrative:  The patient returns today for routine follow-up. She is doing well overall. Since her last visit, she underwent a chest CT scan on 07/24/2020 that was stable and did not show any specific findings to suggest residual or recurrent tumor or metastatic disease.  She reports being set up for surgery for a cyst along her gumline which should help  with her right ear pain  On review of systems, she reports right ear pain, occasional fatigue, shortness of breath during humidity, and dry cough when outdoors. She denies hemoptysis, pain when swallowing, difficulty swallowing, and skin changes.    ALLERGIES:  is allergic to amoxicillin.  Meds: Current Outpatient Medications  Medication Sig Dispense Refill  . albuterol (PROAIR HFA) 108 (90 Base) MCG/ACT inhaler Inhale 2 puffs into the lungs every 6 (six) hours as needed for wheezing or shortness of breath. For shortness of breath. 18 g 11  . Aspirin-Salicylamide-Caffeine (BC HEADACHE POWDER PO) Take 1 packet by mouth daily as needed (for pain or headache).     . celecoxib (CELEBREX) 200 MG capsule Take 1 capsule (200 mg total) by mouth daily. 30 capsule 3  . cyclobenzaprine (FLEXERIL) 10 MG tablet Take 1 tablet (10 mg total) by mouth at bedtime as needed for muscle spasms. 60 tablet 2  . diphenoxylate-atropine (LOMOTIL) 2.5-0.025 MG tablet Take 1 tablet by mouth 4 (four) times daily as needed for diarrhea or  loose stools. 120 tablet 2  . Multiple Vitamin (MULITIVITAMIN WITH MINERALS) TABS Take 1 tablet by mouth daily.    . pantoprazole (PROTONIX) 40 MG tablet TAKE 1 TABLET BY MOUTH EVERY DAY 90 tablet 0  . Polyethyl Glycol-Propyl Glycol (SYSTANE OP) Place 1 drop into both eyes daily as needed (for dry eyes).    . SPIRIVA RESPIMAT 2.5 MCG/ACT AERS INHALE 2 PUFFS BY MOUTH INTO THE LUNGS DAILY 4 g 5   No current facility-administered medications for this encounter.    Physical Findings: The patient is in no acute distress. Patient is alert and oriented.  height is 5\' 5"  (1.651 m) and weight is 93 lb 9.6 oz (42.5 kg). Her temperature is 97.8 F (36.6 C). Her blood pressure is 141/65 (abnormal) and her pulse is 80. Her respiration is 18 and oxygen saturation is 98%.  Lungs are clear to auscultation bilaterally. Heart has regular rate and rhythm. No palpable cervical, supraclavicular, or axillary adenopathy. Abdomen soft, non-tender, normal bowel sounds.    Lab Findings: Lab Results  Component Value Date   WBC 4.0 10/24/2019   HGB 13.9 10/24/2019   HCT 42.0 10/24/2019   MCV 101.2 (H) 10/24/2019   PLT 220 10/24/2019    Radiographic Findings: CT Chest Wo Contrast  Result Date: 07/24/2020 CLINICAL DATA:  Non-small cell lung cancer.  Restaging. EXAM: CT CHEST WITHOUT CONTRAST TECHNIQUE: Multidetector CT imaging of the chest was performed following the standard protocol without IV contrast. COMPARISON:  01/24/2020  FINDINGS: Cardiovascular: Heart size appears normal. There is no pericardial effusion. Aortic atherosclerosis. Three vessel coronary artery atherosclerotic calcifications noted. Mediastinum/Nodes: No enlarged mediastinal or axillary lymph nodes. Thyroid gland, trachea, and esophagus demonstrate no significant findings. Lungs/Pleura: Severe changes of centrilobular and paraseptal emphysema. Fiducial markers are identified within the right lower lobe with surrounding pleuroparenchymal scarring  secondary to radiation change. Focal area of nodular thickening referenced on previous exam measures 6 mm , image 125/5. This is stable when compared with the previous exam. Left lower lobe lung nodule measures 4 mm, image 93/5. Stable from prior study. Upper Abdomen: Small stone identified within upper pole of left kidney. No acute findings identified within the imaged portions of the upper abdomen. Aortic atherosclerosis identified. Musculoskeletal: Scoliosis with mild degenerative disc disease. No acute or suspicious osseous abnormality. IMPRESSION: 1. Stable CT of the chest. No specific findings identified to suggest residual or recurrent tumor or metastatic disease. 2. Emphysema and aortic atherosclerosis. Three vessel coronary artery calcifications noted. Aortic Atherosclerosis (ICD10-I70.0) and Emphysema (ICD10-J43.9). Electronically Signed   By: Kerby Moors M.D.   On: 07/24/2020 09:40    Impression: Non-Small cell lung cancer, clinical stage I-II   No clinical evidence of recurrence on exam today. Recent chest CT scan was stable and did not show any specific findings to suggest residual or recurrent tumor or metastatic disease.   Plan: Patient will follow-up with radiation oncology in six months. Prior to that, she will undergo another CT scan of the chest.  Total time spent in this encounter was 15 minutes which included reviewing the patient's most recent chest CT scan, physical examination, ordering of future chest CT scan, and documentation.  ____________________________________   Blair Promise, PhD, MD   This document serves as a record of services personally performed by Gery Pray, MD. It was created on his behalf by Clerance Lav, a trained medical scribe. The creation of this record is based on the scribe's personal observations and the provider's statements to them. This document has been checked and approved by the attending provider.

## 2020-07-27 NOTE — Progress Notes (Signed)
Patient states that she is having right ear pain that is a 8/10 on the pain scale. Patient states that she is having occasional fatigue. Patient states having a dry cough when outdoors. Patient denies hemoptysis. Patient states having shortness of breath during the humidity. Patient denies having painful or difficult swallowing. Patient states no skin changes. Patient states that she is not currently on chemotherapy treatments. Patient states that she does not use any type oxygen therapy.   BP (!) 141/65 (BP Location: Right Arm, Patient Position: Sitting, Cuff Size: Normal)    Pulse 80    Temp 97.8 F (36.6 C)    Resp 18    Ht 5\' 5"  (1.651 m)    Wt 93 lb 9.6 oz (42.5 kg)    SpO2 98%    BMI 15.58 kg/m

## 2020-07-28 ENCOUNTER — Other Ambulatory Visit: Payer: Self-pay | Admitting: Family Medicine

## 2020-07-28 ENCOUNTER — Telehealth: Payer: Self-pay | Admitting: Family Medicine

## 2020-07-28 NOTE — Telephone Encounter (Signed)
LVM for patient to call back to schedule [facility] appointment with Dr. Ethelene Hal.

## 2020-10-20 ENCOUNTER — Other Ambulatory Visit: Payer: Self-pay | Admitting: Family Medicine

## 2020-10-20 ENCOUNTER — Telehealth: Payer: Self-pay | Admitting: Family Medicine

## 2020-10-20 ENCOUNTER — Other Ambulatory Visit: Payer: Self-pay | Admitting: Emergency Medicine

## 2020-10-20 NOTE — Telephone Encounter (Signed)
Spoke with patient to scheduled her AWV she stated she requested to [facility] to [facility] [employer] due to it is closer to her home.

## 2020-10-27 ENCOUNTER — Telehealth: Payer: Self-pay | Admitting: Family Medicine

## 2020-10-27 ENCOUNTER — Other Ambulatory Visit: Payer: Self-pay | Admitting: Family Medicine

## 2020-10-27 DIAGNOSIS — Z8612 Personal history of poliomyelitis: Secondary | ICD-10-CM

## 2020-10-27 DIAGNOSIS — M1990 Unspecified osteoarthritis, unspecified site: Secondary | ICD-10-CM

## 2020-10-27 NOTE — Telephone Encounter (Signed)
Patient is calling to see if she can do a TOC. She would like to be seen at our location because it's closer. Patient is aware that Dr. Ethelene Hal is out of this office and it may be a while before I get a response. Is this transfer approved? Please advise.  Patients contact info: [phone]

## 2020-10-27 NOTE — Telephone Encounter (Signed)
Spoke with pt state that she has not scheduled appointment t the [place] office, pt advised to call the office and schedule TOC appointment with one of the Dr taking new pt. Pt state that she is out of her Celebrex and needs refill before she finds a PCP who can see her.Please advise if ok to send refill

## 2020-11-02 DIAGNOSIS — R030 Elevated blood-pressure reading, without diagnosis of hypertension: Secondary | ICD-10-CM | POA: Diagnosis not present

## 2020-11-02 DIAGNOSIS — J441 Chronic obstructive pulmonary disease with (acute) exacerbation: Secondary | ICD-10-CM | POA: Diagnosis not present

## 2020-11-02 DIAGNOSIS — R051 Acute cough: Secondary | ICD-10-CM | POA: Diagnosis not present

## 2020-11-02 NOTE — Telephone Encounter (Signed)
Fine with me

## 2020-11-04 ENCOUNTER — Telehealth: Payer: Self-pay | Admitting: Emergency Medicine

## 2020-11-04 DIAGNOSIS — R059 Cough, unspecified: Secondary | ICD-10-CM | POA: Diagnosis not present

## 2020-11-04 DIAGNOSIS — Z20828 Contact with and (suspected) exposure to other viral communicable diseases: Secondary | ICD-10-CM | POA: Diagnosis not present

## 2020-11-04 DIAGNOSIS — J441 Chronic obstructive pulmonary disease with (acute) exacerbation: Secondary | ICD-10-CM | POA: Diagnosis not present

## 2020-11-04 DIAGNOSIS — F17200 Nicotine dependence, unspecified, uncomplicated: Secondary | ICD-10-CM | POA: Diagnosis not present

## 2020-11-04 NOTE — Telephone Encounter (Signed)
Primary Pulmonologist: [place] Last office visit and with whom: 08/21/19 with RB What do we see them for (pulmonary problems): COPD Last OV assessment/plan:  Assessment & Plan Note by Collene Gobble, MD at 08/21/2019 11:09 AM Author: Collene Gobble, MD Author Type: Physician Filed: 08/21/2019 11:09 AM  Note Status: Written Cosign: Cosign Not Required Encounter Date: 08/21/2019  Problem: CIGARETTE SMOKER  Editor: Collene Gobble, MD (Physician)                 Discussed cessation strategies with her today.     Assessment & Plan Note by Collene Gobble, MD at 08/21/2019 11:09 AM Author: Collene Gobble, MD Author Type: Physician Filed: 08/21/2019 11:09 AM  Note Status: Written Cosign: Cosign Not Required Encounter Date: 08/21/2019  Problem: Non-small cell cancer of right lung [facility])  Editor: Collene Gobble, MD (Physician)                 Area of increased opacity near her posterior right lower lobe fiducial marker.  It looks like scar but difficult to fully interpret.  She is planning to have a repeat CT in November to look for interval change/stability    Assessment & Plan Note by Collene Gobble, MD at 08/21/2019 11:08 AM Author: Collene Gobble, MD Author Type: Physician Filed: 08/21/2019 11:08 AM  Note Status: Written Cosign: Cosign Not Required Encounter Date: 08/21/2019  Problem: COPD, mild  Editor: Collene Gobble, MD (Physician)                 No flares.  Remains active.  Continue Spiriva once daily.  We will refill her albuterol.  She is up-to-date on pneumonia shot, needs a high-dose flu shot.    Patient Instructions by Collene Gobble, MD at 08/21/2019 10:45 AM Author: Collene Gobble, MD Author Type: Physician Filed: 08/21/2019 11:07 AM  Note Status: Addendum Cosign: Cosign Not Required Encounter Date: 08/21/2019  Editor: Collene Gobble, MD (Physician)      Prior Versions: 1. Collene Gobble, MD (Physician) at 08/21/2019 11:07 AM - Signed      We talked about stopping  smoking today.  When you are ready to try to set a quit date please let us know.  We may be able to use medications to make the transition off cigarettes easier. Continue Spiriva as you have been taking it once daily. Keep albuterol available use 2 puffs if needed for shortness of breath, chest tightness, wheezing.  We will refill this for you today. Get your CT scan of the chest in November as planned by Dr. Sondra Come.  Follow with him to review. Your pneumonia vaccine is up-to-date. You need the high-dose flu shot this fall. Follow with Dr. Lamonte Sakai in 12 months or sooner if you have any problems.      Was appointment offered to patient (explain)?  Pt wants recommendations   Reason for call: called and spoke with pt who stated she had a pipe burst about 1 week ago in laundry room and since then, she has had complaints of cough with occ clear phlegm, clear postnasal drainage which is worse at night, has had some wheezing. Pt states that she also has some chest tightness.  Pt has used her rescue inhaler once since symptoms began. Pt has also been using her spiriva as well as taking all other meds as prescribed.  Pt went to [facility] 11/29 and was prescribed augmentin as well as pred Rx which  she has been taking since prescribed.  Pt denies any complaints of fever as temp last was 98.3. asked pt if she has had her covid vaccines and she stated that she has not. Pt stated that she did receive her flu shot in September 2021. Asked pt if she was covid tested when she went to [facility] and she stated that they did not test her. Pt said that she does have an at home covid test that she did which came back negative.  Pt wants to know what could be recommended to help with symptoms.  Pt is due for a f/u at the office as she was told to f/u in 1 year from last OV with RB.   Dr. Lamonte Sakai, please advise what you recommend since pt is already on augmentin and prednisone after going to [facility] if we need to go ahead and get pt  scheduled for an appt with an APP or if there is anything else that is recommended.  Allergies  Allergen Reactions  . Amoxicillin Diarrhea and Itching    Immunization History  Administered Date(s) Administered  . Influenza Split 09/20/2011, 08/31/2012  . Influenza Whole 10/05/2005, 09/26/2007, 09/16/2008, 09/02/2009, 09/30/2010  . Influenza, High Dose Seasonal PF 09/05/2017, 08/21/2018  . Influenza,inj,Quad PF,6+ Mos 08/19/2014  . Influenza-Unspecified 09/16/2015  . Pneumococcal Conjugate-13 11/02/2015  . Pneumococcal Polysaccharide-23 01/20/2017  . Td 09/04/2003  . Tdap 04/23/2012

## 2020-11-04 NOTE — Telephone Encounter (Signed)
Forgot to send high priority

## 2020-11-04 NOTE — Telephone Encounter (Signed)
Dr. Lamonte Sakai called the office. I stated to him the reason for the page on Lindsey Rowland.  Per Dr. Lamonte Sakai, pt needs to get a PCR covid test done as the home covid test is not sufficient enough. After covid test has been done, pt can then be scheduled to be seen in office.   Called and spoke with pt stating the info from Dr. Lamonte Sakai that she needs to go get another covid test done at a testing site as the home test is not sufficient enough. Stated to pt once covid test is done and if results do come back negative, we need to get her scheduled for an office appt. Pt verbalized understanding. Nothing further needed.

## 2020-11-17 ENCOUNTER — Other Ambulatory Visit: Payer: Self-pay

## 2020-11-17 ENCOUNTER — Encounter: Payer: Self-pay | Admitting: Pulmonary Disease

## 2020-11-17 ENCOUNTER — Ambulatory Visit: Payer: PPO | Admitting: Pulmonary Disease

## 2020-11-17 VITALS — BP 130/72 | HR 90 | Temp 98.2°F | Ht 65.0 in | Wt 91.4 lb

## 2020-11-17 DIAGNOSIS — C3491 Malignant neoplasm of unspecified part of right bronchus or lung: Secondary | ICD-10-CM | POA: Diagnosis not present

## 2020-11-17 DIAGNOSIS — J449 Chronic obstructive pulmonary disease, unspecified: Secondary | ICD-10-CM | POA: Diagnosis not present

## 2020-11-17 DIAGNOSIS — Z0000 Encounter for general adult medical examination without abnormal findings: Secondary | ICD-10-CM | POA: Insufficient documentation

## 2020-11-17 DIAGNOSIS — F17200 Nicotine dependence, unspecified, uncomplicated: Secondary | ICD-10-CM

## 2020-11-17 MED ORDER — STIOLTO RESPIMAT 2.5-2.5 MCG/ACT IN AERS: 2.0000 | INHALATION_SPRAY | Freq: Every day | RESPIRATORY_TRACT | 0 refills | Status: DC

## 2020-11-17 NOTE — Assessment & Plan Note (Signed)
Plan: Encourage patient to work on stopping smoking completely Trial of Stiolto Respimat Hold Spiriva Respimat 2.5 Continue to work on increasing physical activity Recommend COVID-19 vaccinations, patient declines May need to consider repeating pulmonary function testing

## 2020-11-17 NOTE — Assessment & Plan Note (Signed)
Plan: Would recommend COVID-19 vaccinations, patient declines

## 2020-11-17 NOTE — Progress Notes (Signed)
@Patient  ID: Lindsey Rowland, female    DOB: [birth year], 69 y.o.   MRN: [id number]  Chief Complaint  Patient presents with   Follow-up    Pt has had complaints of a cough x1 month which has just become worse. Pt is coughing up mostly clear phelgm and also has clear postnasal drainage. Also has complaints of wheezing.    Referring provider: Billie Ruddy, MD  HPI:  69 year old female current everyday smoker followed in our office for COPD, cough, non-small cell lung cancer  PMH: Anxiety, hypertension, diverticulitis Smoker/ Smoking History:  Maintenance: Spiriva Respimat 2.5  Pt of: Dr. Lamonte Sakai  11/17/2020  - Visit   69 year old female current everyday smoker followed in our office for cough and COPD. History of non-small cell lung cancer. She is established with Dr. Lamonte Sakai. She was last seen in our office in September/2020. Plan of care at that office visit was as follows: Continue Spiriva Respimat 2.5, up-to-date with pneumonia vaccination, recommended high-dose flu vaccine, encouraged to keep a repeat CT scan in November/2022 follow history of non-small cell cancer right lung, encourage patient to stop smoking.  She contacted our office in December/2021 reporting that she had been seen at a urgent care on 11/02/2020 and prescribed prednisone as well as Augmentin. She reports adherence to her Spiriva. She did and at home Covid test which was negative. Dr. Lamonte Sakai encourage patient to obtain a outpatient PCR. Patient reporting that that was also negative. Patient presenting to her office today.  Patient reporting that she had a water leak in her house in early November/2021.  This prompted high temperatures as well as lots of humidifiers in the patient's house for 7 days to help with the cleanout.  She reports that her breathing is worsened since then.  She has had multiple rounds of prednisone as well as most recently antibiotics Augmentin in late November/2021  Patient does still  continue to smoke 1 to 2 cigarettes a day.  At her peak she was smoking 5 cigarettes a day.  She has received her seasonal flu vaccine.  She continues to decline COVID-19 vaccinations.   Questionaires / Pulmonary Flowsheets:   ACT:  No flowsheet data found.  MMRC: No flowsheet data found.  Epworth:  No flowsheet data found.  Tests:   10/22/2019-CT chest without contrast-decreased thickening of soft tissue around fiducial markers in area posttreatment change, intentional follow-up, continue additional follow-up in 3 months, areas that are most concerning currently are along the right hemidiaphragm in the peripheral chest away from fiducial markers, signs of emphysema as above, borderline enlarged mediastinal lymph node  01/24/2020-CT chest without contrast-stable pleural-parenchymal thickening in the right lower lobe adjacent to [place] fiducial markers, no progressive thickening or nodularity to suggest lung cancer recurrence, extensive centrilobular emphysema, no lymphadenopathy, coronary artery calcification aortic arthrosclerosis  07/24/2020-CT chest without contrast-stable CT chest, no specific findings identified to suggest residual or recurrent tumor or metastatic disease, emphysema and aortic arthrosclerosis  03/20/2018-pulmonary function test-FVC 2.89 (93% predicted), postbronchodilator ratio 57, postbronchodilator FEV1 1.78 (76% predicted), no bronchodilator response, mid flow reversibility, DLCO 9.52 (39% addicted)  FENO:  No results found for: NITRICOXIDE  PFT: PFT Results Latest Ref Rng & Units 03/20/2018  FVC-Pre L 2.89  FVC-Predicted Pre % 93  FVC-Post L 3.15  FVC-Predicted Post % 102  Pre FEV1/FVC % % 56  Post FEV1/FCV % % 57  FEV1-Pre L 1.63  FEV1-Predicted Pre % 69  FEV1-Post L 1.78  DLCO uncorrected ml/min/mmHg 9.52  DLCO UNC% % 39  DLVA Predicted % 39  TLC L 7.14  TLC % Predicted % 141  RV % Predicted % 180    WALK:  No flowsheet data  found.  Imaging: No results found.  Lab Results:  CBC    Component Value Date/Time   WBC 4.0 10/24/2019 1038   WBC 6.2 07/30/2018 1412   RBC 4.15 10/24/2019 1038   HGB 13.9 10/24/2019 1038   HCT 42.0 10/24/2019 1038   PLT 220 10/24/2019 1038   MCV 101.2 (H) 10/24/2019 1038   MCH 33.5 10/24/2019 1038   MCHC 33.1 10/24/2019 1038   RDW 14.6 10/24/2019 1038   LYMPHSABS 1.4 10/24/2019 1038   MONOABS 0.4 10/24/2019 1038   EOSABS 0.0 10/24/2019 1038   BASOSABS 0.0 10/24/2019 1038    BMET    Component Value Date/Time   NA 139 10/24/2019 1038   K 4.2 10/24/2019 1038   CL 104 10/24/2019 1038   CO2 25 10/24/2019 1038   GLUCOSE 83 10/24/2019 1038   BUN 9 10/24/2019 1038   CREATININE 0.72 10/24/2019 1038   CALCIUM 9.0 10/24/2019 1038   GFRNONAA >60 10/24/2019 1038   GFRAA >60 10/24/2019 1038    BNP No results found for: BNP  ProBNP No results found for: PROBNP  Specialty Problems      Pulmonary Problems   COPD, mild ([place])    Qualifier: Diagnosis of  By: Cori Razor RN, Regina G       Allergic rhinitis    Qualifier: Diagnosis of  By: Niel Hummer MD, Lorinda Creed       COUGH    Qualifier: Diagnosis of  By: Niel Hummer MD, Lorinda Creed       Non-small cell cancer of right lung [facility])      Allergies  Allergen Reactions   Amoxicillin Diarrhea and Itching    Immunization History  Administered Date(s) Administered   Influenza Split 09/20/2011, 08/31/2012   Influenza Whole 10/05/2005, 09/26/2007, 09/16/2008, 09/02/2009, 09/30/2010   Influenza, High Dose Seasonal PF 09/05/2017, 08/21/2018, 08/21/2019, 08/12/2020   Influenza,inj,Quad PF,6+ Mos 08/19/2014   Influenza-Unspecified 09/16/2015   Pneumococcal Conjugate-13 11/02/2015   Pneumococcal Polysaccharide-23 01/20/2017   Td 09/04/2003   Tdap 04/23/2012    Past Medical History:  Diagnosis Date   Arthritis    Cancer ([place])    "pre-cervical" cells  - age 40   COPD (chronic obstructive pulmonary  disease) ([place])    Headache(784.0)    Osteopenia    Polio    as a child   Ulcer    stomach hx    Tobacco History: Social History   Tobacco Use  Smoking Status Current Every Day Smoker   Packs/day: 0.25   Years: 40.00   Pack years: 10.00   Types: Cigarettes  Smokeless Tobacco Never Used  Tobacco Comment   currently smoking 1-2 cigs per day   Ready to quit: No Counseling given: Yes Comment: currently smoking 1-2 cigs per day   Continue to not smoke  Outpatient Encounter Medications as of 11/17/2020  Medication Sig   Aspirin-Salicylamide-Caffeine (BC HEADACHE POWDER PO) Take 1 packet by mouth daily as needed (for pain or headache).    celecoxib (CELEBREX) 200 MG capsule TAKE 1 CAPSULE BY MOUTH EVERY DAY   cyclobenzaprine (FLEXERIL) 10 MG tablet Take 1 tablet (10 mg total) by mouth at bedtime as needed for muscle spasms.   diphenoxylate-atropine (LOMOTIL) 2.5-0.025 MG tablet Take 1 tablet by mouth 4 (four) times daily as needed for  diarrhea or loose stools.   Ipratropium-Albuterol (COMBIVENT RESPIMAT IN) Inhale 2 puffs into the lungs every 6 (six) hours as needed.   Multiple Vitamin (MULITIVITAMIN WITH MINERALS) TABS Take 1 tablet by mouth daily.   pantoprazole (PROTONIX) 40 MG tablet TAKE 1 TABLET BY MOUTH EVERY DAY   Polyethyl Glycol-Propyl Glycol (SYSTANE OP) Place 1 drop into both eyes daily as needed (for dry eyes).   SPIRIVA RESPIMAT 2.5 MCG/ACT AERS INHALE 2 PUFFS BY MOUTH INTO THE LUNGS DAILY   Tiotropium Bromide-Olodaterol (STIOLTO RESPIMAT) 2.5-2.5 MCG/ACT AERS Inhale 2 puffs into the lungs daily.   [DISCONTINUED] albuterol (PROAIR HFA) 108 (90 Base) MCG/ACT inhaler Inhale 2 puffs into the lungs every 6 (six) hours as needed for wheezing or shortness of breath. For shortness of breath.   [DISCONTINUED] amoxicillin-clavulanate (AUGMENTIN) 875-125 MG tablet Take 1 tablet by mouth 2 (two) times daily.   [DISCONTINUED] predniSONE (DELTASONE) 20 MG  tablet Take 20 mg by mouth 2 (two) times daily.   No facility-administered encounter medications on file as of 11/17/2020.     Review of Systems  Review of Systems  Constitutional: Positive for fatigue. Negative for activity change and fever.  HENT: Negative for sinus pressure, sinus pain and sore throat.   Respiratory: Positive for cough and shortness of breath. Negative for wheezing.   Cardiovascular: Negative for chest pain and palpitations.  Gastrointestinal: Negative for diarrhea, nausea and vomiting.  Musculoskeletal: Negative for arthralgias.  Neurological: Negative for dizziness.  Psychiatric/Behavioral: Negative for sleep disturbance. The patient is not nervous/anxious.      Physical Exam  BP 130/72 (BP Location: Left Arm, Cuff Size: Normal)    Pulse 90    Temp 98.2 F (36.8 C) (Other (Comment)) Comment (Src): wrist   Ht 5\' 5"  (1.651 m)    Wt 91 lb 6.4 oz (41.5 kg)    SpO2 96%    BMI 15.21 kg/m   Wt Readings from Last 5 Encounters:  11/17/20 91 lb 6.4 oz (41.5 kg)  07/27/20 93 lb 9.6 oz (42.5 kg)  07/08/20 91 lb 6.4 oz (41.5 kg)  01/27/20 95 lb (43.1 kg)  10/24/19 94 lb 2 oz (42.7 kg)    BMI Readings from Last 5 Encounters:  11/17/20 15.21 kg/m  07/27/20 15.58 kg/m  07/08/20 15.21 kg/m  01/27/20 15.81 kg/m  10/24/19 15.66 kg/m     Physical Exam Vitals and nursing note reviewed.  Constitutional:      General: She is not in acute distress.    Appearance: Normal appearance. She is normal weight.     Comments: Thin female  HENT:     Head: Normocephalic and atraumatic.     Right Ear: External ear normal.     Left Ear: External ear normal.     Nose: Nose normal. No congestion.     Mouth/Throat:     Mouth: Mucous membranes are moist.     Pharynx: Oropharynx is clear.  Eyes:     Pupils: Pupils are equal, round, and reactive to light.  Cardiovascular:     Rate and Rhythm: Normal rate and regular rhythm.     Pulses: Normal pulses.     Heart sounds:  Normal heart sounds. No murmur heard.   Pulmonary:     Effort: Pulmonary effort is normal. No respiratory distress.     Breath sounds: No decreased air movement. Wheezing (slight exp wheeze in RML ) present. No decreased breath sounds or rales.  Musculoskeletal:     Cervical back: Normal  range of motion.     Right lower leg: No edema.     Left lower leg: No edema.  Skin:    General: Skin is warm and dry.     Capillary Refill: Capillary refill takes less than 2 seconds.  Neurological:     General: No focal deficit present.     Mental Status: She is alert and oriented to person, place, and time. Mental status is at baseline.     Gait: Gait normal.  Psychiatric:        Mood and Affect: Mood normal.        Behavior: Behavior normal.        Thought Content: Thought content normal.        Judgment: Judgment normal.       Assessment & Plan:   COPD, mild Plan: Encourage patient to work on stopping smoking completely Trial of Stiolto Respimat Hold Spiriva Respimat 2.5 Continue to work on increasing physical activity Recommend COVID-19 vaccinations, patient declines May need to consider repeating pulmonary function testing  Non-small cell cancer of right lung ([place]) Plan: Continue follow-up with radiation oncology  CIGARETTE SMOKER Continues to smoke 1 to 2 cigarettes a day  Plan: Encourage patient to stop completely Patient needs to set quit date Patient trying to utilize reduce to quit method  Healthcare maintenance Plan: Would recommend COVID-19 vaccinations, patient declines    Return in about 2 months (around 01/18/2021), or if symptoms worsen or fail to improve, for Follow up with Dr. Lamonte Sakai.   Lauraine Rinne, NP 11/17/2020   This appointment required 32 minutes of patient care (this includes precharting, chart review, review of results, face-to-face care, etc.).

## 2020-11-17 NOTE — Assessment & Plan Note (Signed)
Plan: Continue follow-up with radiation oncology

## 2020-11-17 NOTE — Patient Instructions (Addendum)
You were seen today by Lauraine Rinne, NP  for:   1. COPD, mild (HCC)  Trial of Stiolto Respimat inhaler >>>2 puffs daily >>>Take this no matter what >>>This is not a rescue inhaler  2 samples provided today, contact our office after finishing the first sample either via MyChart or by calling let us know how you are doing.  If you are tolerating this medication and prefer to be on it we can send in a prescription  Hold Spiriva Respimat 2.5 when trialing Stiolto Respimat  We strongly recommend that you work on stopping smoking completely  Note your daily symptoms > remember "red flags" for COPD:   >>>Increase in cough >>>increase in sputum production >>>increase in shortness of breath or activity  intolerance.   If you notice these symptoms, please call the office to be seen.   If your shortness of breath continues to persist despite inhaler change we may need to consider repeating pulmonary function testing  2. Non-small cell cancer of right lung ([place])  Continue to follow-up with radiation oncology  Complete repeat CT scan in January/2022  3. CIGARETTE SMOKER  We recommend that you stop smoking.  >>>You need to set a quit date >>>If you have friends or family who smoke, let them know you are trying to quit and not to smoke around you or in your living environment  Smoking Cessation Resources:  1 800 QUIT NOW  >>> Patient to call this resource and utilize it to help support her quit smoking >>> Keep up your hard work with stopping smoking  You can also contact the [facility] >>>For smoking cessation classes call [phone]  We do not recommend using e-cigarettes as a form of stopping smoking  You can sign up for smoking cessation support texts and information:  >>>https://smokefree.gov/smokefreetxt   4. Healthcare maintenance  Great job already receiving your seasonal flu vaccination  We would recommend that you obtain your COVID-19  vaccinations  If you have additional questions or concerns regarding this please feel free to contact our office   We recommend today:   Meds ordered this encounter  Medications  . Tiotropium Bromide-Olodaterol (STIOLTO RESPIMAT) 2.5-2.5 MCG/ACT AERS    Sig: Inhale 2 puffs into the lungs daily.    Dispense:  4 g    Refill:  0    Order Specific Question:   Lot Number?    Answer:   [id number]    Order Specific Question:   Expiration Date?    Answer:   08/05/2022    Order Specific Question:   Quantity    Answer:   2    Follow Up:    Return in about 2 months (around 01/18/2021), or if symptoms worsen or fail to improve, for Follow up with Dr. Lamonte Sakai.   Notification of test results are managed in the following manner: If there are  any recommendations or changes to the  plan of care discussed in office today,  we will contact you and let you know what they are. If you do not hear from [place], then your results are normal and you can view them through your  MyChart account , or a letter will be sent to you. Thank you again for trusting [place] with your care  - Thank you, [facility]    It is flu season:   >>> Best ways to protect herself from the flu: Receive the yearly flu vaccine, practice good hand hygiene washing with soap and also  using hand sanitizer when available, eat a nutritious meals, get adequate rest, hydrate appropriately       Please contact the office if your symptoms worsen or you have concerns that you are not improving.   Thank you for choosing [facility] for your healthcare, and for allowing [place] to partner with you on your healthcare journey. I am thankful to be able to provide care to you today.   Wyn Quaker FNP-C    COPD and Physical Activity Chronic obstructive pulmonary disease (COPD) is a long-term (chronic) condition that affects the lungs. COPD is a general term that can be used to describe many different lung problems that cause lung swelling  (inflammation) and limit airflow, including chronic bronchitis and emphysema. The main symptom of COPD is shortness of breath, which makes it harder to do even simple tasks. This can also make it harder to exercise and be active. Talk with your health care provider about treatments to help you breathe better and actions you can take to prevent breathing problems during physical activity. What are the benefits of exercising with COPD? Exercising regularly is an important part of a healthy lifestyle. You can still exercise and do physical activities even though you have COPD. Exercise and physical activity improve your shortness of breath by increasing blood flow (circulation). This causes your heart to pump more oxygen through your body. Moderate exercise can improve your:  Oxygen use.  Energy level.  Shortness of breath.  Strength in your breathing muscles.  Heart health.  Sleep.  Self-esteem and feelings of self-worth.  Depression, stress, and anxiety levels. Exercise can benefit everyone with COPD. The severity of your disease may affect how hard you can exercise, especially at first, but everyone can benefit. Talk with your health care provider about how much exercise is safe for you, and which activities and exercises are safe for you. What actions can I take to prevent breathing problems during physical activity?  Sign up for a pulmonary rehabilitation program. This type of program may include: ? Education about lung diseases. ? Exercise classes that teach you how to exercise and be more active while improving your breathing. This usually involves:  Exercise using your lower extremities, such as a stationary bicycle.  About 30 minutes of exercise, 2 to 5 times per week, for 6 to 12 weeks  Strength training, such as push ups or leg lifts. ? Nutrition education. ? Group classes in which you can talk with others who also have COPD and learn ways to manage stress.  If you use an  oxygen tank, you should use it while you exercise. Work with your health care provider to adjust your oxygen for your physical activity. Your resting flow rate is different from your flow rate during physical activity.  While you are exercising: ? Take slow breaths. ? Pace yourself and do not try to go too fast. ? Purse your lips while breathing out. Pursing your lips is similar to a kissing or whistling position. ? If doing exercise that uses a quick burst of effort, such as weight lifting:  Breathe in before starting the exercise.  Breathe out during the hardest part of the exercise (such as raising the weights). Where to find support You can find support for exercising with COPD from:  Your health care provider.  A pulmonary rehabilitation program.  Your local health department or community health programs.  Support groups, online or in-person. Your health care provider may be able to recommend  support groups. Where to find more information You can find more information about exercising with COPD from:  American Lung Association: ClassInsider.se.  COPD Foundation: https://www.rivera.net/. Contact a health care provider if:  Your symptoms get worse.  You have chest pain.  You have nausea.  You have a fever.  You have trouble talking or catching your breath.  You want to start a new exercise program or a new activity. Summary  COPD is a general term that can be used to describe many different lung problems that cause lung swelling (inflammation) and limit airflow. This includes chronic bronchitis and emphysema.  Exercise and physical activity improve your shortness of breath by increasing blood flow (circulation). This causes your heart to provide more oxygen to your body.  Contact your health care provider before starting any exercise program or new activity. Ask your health care provider what exercises and activities are safe for you. This information is not intended to replace  advice given to you by your health care provider. Make sure you discuss any questions you have with your health care provider. Document Revised: 03/13/2019 Document Reviewed: 12/14/2017 Elsevier Patient Education  2020 [employer].

## 2020-11-17 NOTE — Assessment & Plan Note (Signed)
Continues to smoke 1 to 2 cigarettes a day  Plan: Encourage patient to stop completely Patient needs to set quit date Patient trying to utilize reduce to quit method

## 2020-11-19 ENCOUNTER — Other Ambulatory Visit: Payer: Self-pay | Admitting: Family Medicine

## 2020-11-19 DIAGNOSIS — M1990 Unspecified osteoarthritis, unspecified site: Secondary | ICD-10-CM

## 2020-11-19 DIAGNOSIS — Z8612 Personal history of poliomyelitis: Secondary | ICD-10-CM

## 2020-12-07 ENCOUNTER — Other Ambulatory Visit: Payer: Self-pay | Admitting: Pulmonary Disease

## 2020-12-07 ENCOUNTER — Telehealth: Payer: Self-pay | Admitting: Pulmonary Disease

## 2020-12-07 DIAGNOSIS — J449 Chronic obstructive pulmonary disease, unspecified: Secondary | ICD-10-CM

## 2020-12-07 MED ORDER — STIOLTO RESPIMAT 2.5-2.5 MCG/ACT IN AERS: 2.0000 | INHALATION_SPRAY | Freq: Every day | RESPIRATORY_TRACT | 5 refills | Status: DC

## 2020-12-07 NOTE — Telephone Encounter (Signed)
12/07/2020  Okay to send prescription for Stiolto.  Okay to order chest x-ray.  Can associate both to COPD.  Can also add chest wall pain status post fall for chest x-ray.  Wyn Quaker, FNP

## 2020-12-07 NOTE — Telephone Encounter (Signed)
Called pt back and there was no answer- LMTCB  I have already placed order for CXR and Aaron Edelman sent her Stiolto in

## 2020-12-07 NOTE — Telephone Encounter (Signed)
Spoke with pt, aware of recs.  Nothing further needed at this time- will close encounter.   

## 2020-12-07 NOTE — Telephone Encounter (Signed)
Called and spoke with pt who states she would like Rx for Stiolto sent to pharmacy for her as she believes it is working better for her than the [facility]. Rx has been sent to preferred pharmacy for pt.  Also while speaking with pt, pt states she feels like she might need to have a cxr done which had been discussed at last OV with Aaron Edelman. Pt said that she had fallen and had mentioned this to [facility] and she said he told her if she was no better that a cxr might be able to be ordered.  Aaron Edelman, please advise.

## 2020-12-08 ENCOUNTER — Ambulatory Visit (INDEPENDENT_AMBULATORY_CARE_PROVIDER_SITE_OTHER): Payer: PPO

## 2020-12-08 DIAGNOSIS — J449 Chronic obstructive pulmonary disease, unspecified: Secondary | ICD-10-CM

## 2020-12-08 DIAGNOSIS — J439 Emphysema, unspecified: Secondary | ICD-10-CM | POA: Diagnosis not present

## 2020-12-08 DIAGNOSIS — R0789 Other chest pain: Secondary | ICD-10-CM | POA: Diagnosis not present

## 2020-12-10 DIAGNOSIS — H04123 Dry eye syndrome of bilateral lacrimal glands: Secondary | ICD-10-CM | POA: Diagnosis not present

## 2020-12-10 DIAGNOSIS — H40013 Open angle with borderline findings, low risk, bilateral: Secondary | ICD-10-CM | POA: Diagnosis not present

## 2020-12-10 DIAGNOSIS — H10413 Chronic giant papillary conjunctivitis, bilateral: Secondary | ICD-10-CM | POA: Diagnosis not present

## 2020-12-10 DIAGNOSIS — Z961 Presence of intraocular lens: Secondary | ICD-10-CM | POA: Diagnosis not present

## 2020-12-10 DIAGNOSIS — H2512 Age-related nuclear cataract, left eye: Secondary | ICD-10-CM | POA: Diagnosis not present

## 2020-12-23 ENCOUNTER — Other Ambulatory Visit: Payer: Self-pay | Admitting: Family Medicine

## 2020-12-23 DIAGNOSIS — Z8612 Personal history of poliomyelitis: Secondary | ICD-10-CM

## 2020-12-23 DIAGNOSIS — M1990 Unspecified osteoarthritis, unspecified site: Secondary | ICD-10-CM

## 2021-01-14 ENCOUNTER — Other Ambulatory Visit: Payer: Self-pay | Admitting: Family Medicine

## 2021-01-14 DIAGNOSIS — L578 Other skin changes due to chronic exposure to nonionizing radiation: Secondary | ICD-10-CM | POA: Insufficient documentation

## 2021-01-14 DIAGNOSIS — L570 Actinic keratosis: Secondary | ICD-10-CM

## 2021-01-14 DIAGNOSIS — L821 Other seborrheic keratosis: Secondary | ICD-10-CM | POA: Diagnosis not present

## 2021-01-14 DIAGNOSIS — L814 Other melanin hyperpigmentation: Secondary | ICD-10-CM

## 2021-01-14 HISTORY — DX: Other seborrheic keratosis: L82.1

## 2021-01-14 HISTORY — DX: Other melanin hyperpigmentation: L81.4

## 2021-01-14 HISTORY — DX: Actinic keratosis: L57.0

## 2021-01-16 ENCOUNTER — Other Ambulatory Visit: Payer: Self-pay | Admitting: Family Medicine

## 2021-01-16 DIAGNOSIS — M1990 Unspecified osteoarthritis, unspecified site: Secondary | ICD-10-CM

## 2021-01-16 DIAGNOSIS — Z8612 Personal history of poliomyelitis: Secondary | ICD-10-CM

## 2021-01-19 ENCOUNTER — Telehealth: Payer: Self-pay | Admitting: *Deleted

## 2021-01-19 NOTE — Telephone Encounter (Signed)
CALLED PATIENT TO INFORM OF CT FOR 01-27-21- ARRIVAL TIME- 3:45 PM @ [facility], NO RESTRICTIONS TO TEST, PATIENT TO RECEIVE RESULTS FROM DR. KINARD ON 01-28-21 @ 11 AM, PATIENT VERIFIED UNDERSTANDING THESE APPTS.

## 2021-01-27 ENCOUNTER — Other Ambulatory Visit: Payer: Self-pay

## 2021-01-27 ENCOUNTER — Ambulatory Visit (HOSPITAL_COMMUNITY)
Admission: RE | Admit: 2021-01-27 | Discharge: 2021-01-27 | Disposition: A | Discharge status: Home / Self Care | Payer: PPO | Source: Ambulatory Visit | Attending: Radiation Oncology | Admitting: Radiation Oncology

## 2021-01-27 DIAGNOSIS — I2510 Atherosclerotic heart disease of native coronary artery without angina pectoris: Secondary | ICD-10-CM | POA: Diagnosis not present

## 2021-01-27 DIAGNOSIS — J984 Other disorders of lung: Secondary | ICD-10-CM | POA: Diagnosis not present

## 2021-01-27 DIAGNOSIS — C3491 Malignant neoplasm of unspecified part of right bronchus or lung: Secondary | ICD-10-CM | POA: Insufficient documentation

## 2021-01-27 DIAGNOSIS — C3490 Malignant neoplasm of unspecified part of unspecified bronchus or lung: Secondary | ICD-10-CM | POA: Diagnosis not present

## 2021-01-27 DIAGNOSIS — J432 Centrilobular emphysema: Secondary | ICD-10-CM | POA: Diagnosis not present

## 2021-01-28 ENCOUNTER — Other Ambulatory Visit: Payer: Self-pay

## 2021-01-28 ENCOUNTER — Encounter: Payer: Self-pay | Admitting: *Deleted

## 2021-01-28 ENCOUNTER — Ambulatory Visit
Admission: RE | Admit: 2021-01-28 | Discharge: 2021-01-28 | Disposition: A | Discharge status: Home / Self Care | Payer: PPO | Source: Ambulatory Visit | Attending: Radiation Oncology | Admitting: Radiation Oncology

## 2021-01-28 DIAGNOSIS — C3491 Malignant neoplasm of unspecified part of right bronchus or lung: Secondary | ICD-10-CM

## 2021-01-28 DIAGNOSIS — Z85118 Personal history of other malignant neoplasm of bronchus and lung: Secondary | ICD-10-CM | POA: Insufficient documentation

## 2021-01-28 DIAGNOSIS — Z923 Personal history of irradiation: Secondary | ICD-10-CM | POA: Insufficient documentation

## 2021-01-28 DIAGNOSIS — Z791 Long term (current) use of non-steroidal anti-inflammatories (NSAID): Secondary | ICD-10-CM | POA: Diagnosis not present

## 2021-01-28 HISTORY — DX: Personal history of irradiation: Z92.3

## 2021-01-28 NOTE — Progress Notes (Signed)
Radiation Oncology         [phone] ________________________________  Name: Lindsey Rowland MRN: [id number]  Date: 01/28/2021  DOB: 1951-08-16  Follow-Up Visit Note  CC: Billie Ruddy, MD  Billie Ruddy, MD    ICD-10-CM   1. Non-small cell cancer of right lung [facility])  C34.91 CT CHEST WO CONTRAST    Diagnosis: Non-Small cell lung cancer, clinical stage I-II   Interval Since Last Radiation: Two years, four months, one week, and three days  09/10/2018, 09/12/2018, 09/17/2018: Right lung / 54 Gy in 3 fractions (SBRT)  Narrative:  The patient returns today for routine follow-up. She is doing well overall. Since her last visit, she underwent a chest CT scan on 01/27/21. Results are still pending.  On review of systems, she reports pain along the right lower lateral chest area where she recently had a fall out of her golf cart. She denies significant cough shortness of breath or hemoptysis.Marland Kitchen   ALLERGIES:  is allergic to amoxicillin.  Meds: Current Outpatient Medications  Medication Sig Dispense Refill  . Aspirin-Salicylamide-Caffeine (BC HEADACHE POWDER PO) Take 1 packet by mouth daily as needed (for pain or headache).     . celecoxib (CELEBREX) 200 MG capsule TAKE 1 CAPSULE BY MOUTH EVERY DAY 30 capsule 0  . cyclobenzaprine (FLEXERIL) 10 MG tablet Take 1 tablet (10 mg total) by mouth at bedtime as needed for muscle spasms. 60 tablet 2  . Multiple Vitamin (MULITIVITAMIN WITH MINERALS) TABS Take 1 tablet by mouth daily.    . pantoprazole (PROTONIX) 40 MG tablet TAKE 1 TABLET BY MOUTH EVERY DAY 90 tablet 0  . Polyethyl Glycol-Propyl Glycol (SYSTANE OP) Place 1 drop into both eyes daily as needed (for dry eyes).    . STIOLTO RESPIMAT 2.5-2.5 MCG/ACT AERS INHALE 2 PUFFS BY MOUTH INTO THE LUNGS DAILY 1 each 5  . diphenoxylate-atropine (LOMOTIL) 2.5-0.025 MG tablet Take 1 tablet by mouth 4 (four) times daily as needed for diarrhea or loose stools. (Patient not taking: Reported on  01/28/2021) 120 tablet 2   No current facility-administered medications for this encounter.    Physical Findings: The patient is in no acute distress. Patient is alert and oriented.  height is 5\' 5"  (1.651 m) and weight is 91 lb 9.6 oz (41.5 kg). Her temperature is 97.8 F (36.6 C). Her blood pressure is 159/73 (abnormal) and her pulse is 75. Her respiration is 18 and oxygen saturation is 97%.  Lungs are clear to auscultation bilaterally. Heart has regular rate and rhythm. No palpable cervical, supraclavicular, or axillary adenopathy. Abdomen soft, non-tender, normal bowel sounds.  Palpation along the right lateral posterior lower chest wall area reveals some soft tissue swelling.   Lab Findings: Lab Results  Component Value Date   WBC 4.0 10/24/2019   HGB 13.9 10/24/2019   HCT 42.0 10/24/2019   MCV 101.2 (H) 10/24/2019   PLT 220 10/24/2019    Radiographic Findings: No results found.  Impression: Non-Small cell lung cancer, clinical stage I-II   No clinical evidence of recurrence on exam today. Recent chest CT scan is pending at this time.  Plan: Patient will follow-up with radiation oncology in six months. Prior to that, she will undergo another CT scan of the chest.  I have asked patient to call back early next week if we have not called her about her recent chest CT scan.  I am unclear why the report has not been completed.  Total time spent in this  encounter was 20 minutes which included reviewing the patient's most recent chest CT scan, physical examination, ordering of future chest CT scan, and documentation.  ____________________________________   Blair Promise, PhD, MD   This document serves as a record of services personally performed by Gery Pray, MD. It was created on his behalf by Clerance Lav, a trained medical scribe. The creation of this record is based on the scribe's personal observations and the provider's statements to them. This document has been checked  and approved by the attending provider.

## 2021-01-28 NOTE — Progress Notes (Signed)
Patient is here today for follow up post radiation that was completed October 2019.  Patient denies having any pain while resting. States she fell off of golf cart when it hit a stump and she has a bruise on her back on the right side.  States with movement she experiences a pain that radiates around the front under her rib cage.  Patient Does not require oxygen.  Denies shortness of breath/denies cough.  Patient denies swallowing choking concerns.  She does report that she still smokes most days.  Appetite is "so so" and is unable to gain any weight.  Experiences fatigue worse in the winter.  Vitals:   01/28/21 1105  BP: (!) 159/73  Pulse: 75  Resp: 18  Temp: 97.8 F (36.6 C)  SpO2: 97%  Weight: 91 lb 9.6 oz (41.5 kg)  Height: 5\' 5"  (1.651 m)

## 2021-01-30 ENCOUNTER — Other Ambulatory Visit: Payer: Self-pay | Admitting: Family Medicine

## 2021-02-02 ENCOUNTER — Telehealth: Payer: Self-pay | Admitting: *Deleted

## 2021-02-02 NOTE — Telephone Encounter (Signed)
Per Dr Sondra Come advised patient that her "Chest CT scan is ok".  Communicated this to the patient and she had no further questions at this time.

## 2021-02-12 NOTE — Telephone Encounter (Signed)
Okay with me 

## 2021-02-12 NOTE — Telephone Encounter (Signed)
Please see message below

## 2021-02-15 ENCOUNTER — Other Ambulatory Visit: Payer: Self-pay | Admitting: Family Medicine

## 2021-02-15 DIAGNOSIS — M1990 Unspecified osteoarthritis, unspecified site: Secondary | ICD-10-CM

## 2021-02-15 DIAGNOSIS — Z8612 Personal history of poliomyelitis: Secondary | ICD-10-CM

## 2021-03-31 ENCOUNTER — Ambulatory Visit (INDEPENDENT_AMBULATORY_CARE_PROVIDER_SITE_OTHER): Payer: PPO | Admitting: Family Medicine

## 2021-03-31 ENCOUNTER — Encounter: Payer: Self-pay | Admitting: Family Medicine

## 2021-03-31 ENCOUNTER — Other Ambulatory Visit: Payer: Self-pay

## 2021-03-31 VITALS — BP 140/82 | HR 80 | Temp 97.4°F | Ht 65.0 in | Wt 90.0 lb

## 2021-03-31 DIAGNOSIS — C3491 Malignant neoplasm of unspecified part of right bronchus or lung: Secondary | ICD-10-CM

## 2021-03-31 DIAGNOSIS — I2510 Atherosclerotic heart disease of native coronary artery without angina pectoris: Secondary | ICD-10-CM | POA: Diagnosis not present

## 2021-03-31 DIAGNOSIS — R911 Solitary pulmonary nodule: Secondary | ICD-10-CM

## 2021-03-31 DIAGNOSIS — R222 Localized swelling, mass and lump, trunk: Secondary | ICD-10-CM

## 2021-03-31 DIAGNOSIS — R1013 Epigastric pain: Secondary | ICD-10-CM | POA: Diagnosis not present

## 2021-03-31 DIAGNOSIS — C3490 Malignant neoplasm of unspecified part of unspecified bronchus or lung: Secondary | ICD-10-CM

## 2021-03-31 DIAGNOSIS — I2584 Coronary atherosclerosis due to calcified coronary lesion: Secondary | ICD-10-CM | POA: Diagnosis not present

## 2021-03-31 DIAGNOSIS — R0782 Intercostal pain: Secondary | ICD-10-CM | POA: Diagnosis not present

## 2021-03-31 LAB — HEPATIC FUNCTION PANEL
ALT: 12 U/L (ref 0–35)
AST: 25 U/L (ref 0–37)
Albumin: 4.4 g/dL (ref 3.5–5.2)
Alkaline Phosphatase: 89 U/L (ref 39–117)
Bilirubin, Direct: 0.1 mg/dL (ref 0.0–0.3)
Total Bilirubin: 0.4 mg/dL (ref 0.2–1.2)
Total Protein: 6.8 g/dL (ref 6.0–8.3)

## 2021-03-31 LAB — CBC
HCT: 43.3 % (ref 36.0–46.0)
Hemoglobin: 14.7 g/dL (ref 12.0–15.0)
MCHC: 34 g/dL (ref 30.0–36.0)
MCV: 102.5 fl — ABNORMAL HIGH (ref 78.0–100.0)
Platelets: 222 10*3/uL (ref 150.0–400.0)
RBC: 4.23 Mil/uL (ref 3.87–5.11)
RDW: 15.1 % (ref 11.5–15.5)
WBC: 3.8 10*3/uL — ABNORMAL LOW (ref 4.0–10.5)

## 2021-03-31 LAB — BASIC METABOLIC PANEL
BUN: 15 mg/dL (ref 6–23)
CO2: 25 mEq/L (ref 19–32)
Calcium: 8.9 mg/dL (ref 8.4–10.5)
Chloride: 104 mEq/L (ref 96–112)
Creatinine, Ser: 0.73 mg/dL (ref 0.40–1.20)
GFR: 83.16 mL/min (ref >60.00)
Glucose, Bld: 76 mg/dL (ref 70–99)
Potassium: 4.1 mEq/L (ref 3.5–5.1)
Sodium: 140 mEq/L (ref 135–145)

## 2021-03-31 LAB — LIPID PANEL
Cholesterol: 163 mg/dL (ref 0–200)
HDL: 73.6 mg/dL (ref >39.00)
LDL Cholesterol: 80 mg/dL (ref 0–99)
NonHDL: 88.95
Total CHOL/HDL Ratio: 2
Triglycerides: 46 mg/dL (ref 0.0–149.0)
VLDL: 9.2 mg/dL (ref 0.0–40.0)

## 2021-03-31 LAB — AMYLASE: Amylase: 25 U/L — ABNORMAL LOW (ref 27–131)

## 2021-03-31 NOTE — Progress Notes (Addendum)
Established Patient Office Visit  Subjective:  Patient ID: Lindsey Rowland, female    DOB: 05/05/1951  Age: 70 y.o. MRN: [id number]  CC:  Chief Complaint  Patient presents with  . Transitions Of Care    TOC from Dr. Volanda Napoleon, knot on right side of back from a fall in September 2021 still little painful.    HPI [name] presents for establishment of care, follow-up of her cholesterol with a history of calcified coronary arteries and a mass on her right posterior chest wall.  Patient had a golf cart rack on her property in September of this past year.  She tells that she developed a mass in her right posterior chest after this accident.  This mass was initially tender but now is not.  She has lingering chest wall pain lower in her chest that circles around to the front of her chest wall.  A reading of a follow-up CT scan for non-small cell cancer in her right lung this past February did not comment on the mass.  Her cancer has been treated with radiation therapy.  She is 3 years out at this point.  She denies night sweats but has not been able to gain her weight back.  She has a history of COPD.  She continues to smoke 1 to 2 cigarettes daily.  She averages 2 cans of beer daily.  Past Medical History:  Diagnosis Date  . Arthritis   . Cancer [facility])    "pre-cervical" cells  - age 52  . COPD (chronic obstructive pulmonary disease) ([place])   . Headache(784.0)   . History of radiation therapy 09/10/2018-09/17/2018   SBRT 3 fx to right lung; Dr. Gery Pray  . Osteopenia   . Polio    as a child  . Ulcer    stomach hx    Past Surgical History:  Procedure Laterality Date  . APPENDECTOMY    . BREAST BIOPSY Right 2013  . CATARACT EXTRACTION     right  . DILATION AND CURETTAGE OF UTERUS    . FOOT SURGERY Bilateral   . hip si joint     injection/ablation  . RETINAL DETACHMENT SURGERY  12/2010   right eye  . TONSILLECTOMY    . VIDEO BRONCHOSCOPY WITH ENDOBRONCHIAL NAVIGATION Right  08/01/2018   Procedure: VIDEO BRONCHOSCOPY WITH ENDOBRONCHIAL NAVIGATION Fiducial Placement;  Surgeon: Collene Gobble, MD;  Location: [facility] OR;  Service: Thoracic;  Laterality: Right;    Family History  Problem Relation Age of Onset  . Hiatal hernia Father   . Colon cancer Neg Hx   . Esophageal cancer Neg Hx   . Stomach cancer Neg Hx   . Rectal cancer Neg Hx     Social History   Socioeconomic History  . Marital status: Married    Spouse name: Not on file  . Number of children: Not on file  . Years of education: Not on file  . Highest education level: Not on file  Occupational History  . Not on file  Tobacco Use  . Smoking status: Current Every Day Smoker    Packs/day: 0.25    Years: 40.00    Pack years: 10.00    Types: Cigarettes  . Smokeless tobacco: Never Used  . Tobacco comment: currently smoking 1-2 cigs per day  Vaping Use  . Vaping Use: Never used  Substance and Sexual Activity  . Alcohol use: Yes    Alcohol/week: 14.0 standard drinks    Types: 14  Cans of beer per week  . Drug use: No  . Sexual activity: Never  Other Topics Concern  . Not on file  Social History Narrative  . Not on file   Social Determinants of Health   Financial Resource Strain: Not on file  Food Insecurity: Not on file  Transportation Needs: Not on file  Physical Activity: Not on file  Stress: Not on file  Social Connections: Not on file  Intimate Partner Violence: Not on file    Outpatient Medications Prior to Visit  Medication Sig Dispense Refill  . Aspirin-Salicylamide-Caffeine (BC HEADACHE POWDER PO) Take 1 packet by mouth daily as needed (for pain or headache).     . celecoxib (CELEBREX) 200 MG capsule TAKE 1 CAPSULE BY MOUTH EVERY DAY 90 capsule 0  . cyclobenzaprine (FLEXERIL) 10 MG tablet Take 1 tablet (10 mg total) by mouth at bedtime as needed for muscle spasms. 60 tablet 2  . Multiple Vitamin (MULITIVITAMIN WITH MINERALS) TABS Take 1 tablet by mouth daily.    . pantoprazole  (PROTONIX) 40 MG tablet TAKE 1 TABLET BY MOUTH EVERY DAY 90 tablet 0  . Polyethyl Glycol-Propyl Glycol (SYSTANE OP) Place 1 drop into both eyes daily as needed (for dry eyes).    . STIOLTO RESPIMAT 2.5-2.5 MCG/ACT AERS INHALE 2 PUFFS BY MOUTH INTO THE LUNGS DAILY 1 each 5  . diphenoxylate-atropine (LOMOTIL) 2.5-0.025 MG tablet Take 1 tablet by mouth 4 (four) times daily as needed for diarrhea or loose stools. (Patient not taking: No sig reported) 120 tablet 2   No facility-administered medications prior to visit.    Allergies  Allergen Reactions  . Amoxicillin Diarrhea and Itching    ROS Review of Systems  Constitutional: Negative for diaphoresis, fatigue, fever and unexpected weight change.  HENT: Negative.   Eyes: Negative for photophobia and visual disturbance.  Respiratory: Positive for shortness of breath. Negative for chest tightness.   Cardiovascular: Negative.   Gastrointestinal: Positive for vomiting. Negative for constipation, diarrhea and nausea.  Endocrine: Negative for polyphagia and polyuria.  Genitourinary: Negative for difficulty urinating, dysuria and frequency.  Musculoskeletal: Positive for back pain (chest wall on right. ).  Neurological: Negative for speech difficulty and weakness.  Psychiatric/Behavioral: Negative.       Objective:    Physical Exam Constitutional:      General: She is not in acute distress.    Appearance: Normal appearance. She is not toxic-appearing or diaphoretic.  HENT:     Head: Normocephalic and atraumatic.     Right Ear: Tympanic membrane, ear canal and external ear normal.     Left Ear: Tympanic membrane, ear canal and external ear normal.     Mouth/Throat:     Mouth: Mucous membranes are moist.     Pharynx: Oropharynx is clear. No oropharyngeal exudate or posterior oropharyngeal erythema.  Eyes:     General: No scleral icterus.       Right eye: No discharge.        Left eye: No discharge.     Extraocular Movements:  Extraocular movements intact.     Conjunctiva/sclera: Conjunctivae normal.     Pupils: Pupils are equal, round, and reactive to light.  Neck:     Vascular: No carotid bruit.  Cardiovascular:     Rate and Rhythm: Normal rate and regular rhythm.  Pulmonary:     Effort: Pulmonary effort is normal.     Breath sounds: Decreased air movement present. No rhonchi or rales.  Abdominal:  General: Abdomen is flat. Bowel sounds are normal.     Tenderness: There is abdominal tenderness in the epigastric area. There is guarding.  Musculoskeletal:       Arms:     Cervical back: No rigidity or tenderness.  Lymphadenopathy:     Cervical: No cervical adenopathy.  Skin:    General: Skin is warm and dry.  Neurological:     Mental Status: She is alert and oriented to person, place, and time.  Psychiatric:        Mood and Affect: Mood normal.        Behavior: Behavior normal.     BP 140/82   Pulse 80   Temp (!) 97.4 F (36.3 C) (Temporal)   Ht 5\' 5"  (1.651 m)   Wt 90 lb (40.8 kg)   SpO2 96%   BMI 14.98 kg/m  Wt Readings from Last 3 Encounters:  03/31/21 90 lb (40.8 kg)  01/28/21 91 lb 9.6 oz (41.5 kg)  11/17/20 91 lb 6.4 oz (41.5 kg)     There are no preventive care reminders to display for this patient.  There are no preventive care reminders to display for this patient.  Lab Results  Component Value Date   TSH 0.59 07/19/2017   Lab Results  Component Value Date   WBC 3.8 (L) 03/31/2021   HGB 14.7 03/31/2021   HCT 43.3 03/31/2021   MCV 102.5 (H) 03/31/2021   PLT 222.0 03/31/2021   Lab Results  Component Value Date   NA 140 03/31/2021   K 4.1 Slight hemolysis.Marland Kitchen 03/31/2021   CO2 25 03/31/2021   GLUCOSE 76 03/31/2021   BUN 15 03/31/2021   CREATININE 0.73 03/31/2021   BILITOT 0.4 03/31/2021   ALKPHOS 89 03/31/2021   AST 25 03/31/2021   ALT 12 03/31/2021   PROT 6.8 03/31/2021   ALBUMIN 4.4 03/31/2021   CALCIUM 8.9 03/31/2021   ANIONGAP 10 10/24/2019   GFR 83.16  03/31/2021   Lab Results  Component Value Date   CHOL 163 03/31/2021   Lab Results  Component Value Date   HDL 73.60 03/31/2021   Lab Results  Component Value Date   LDLCALC 80 03/31/2021   Lab Results  Component Value Date   TRIG 46.0 03/31/2021   Lab Results  Component Value Date   CHOLHDL 2 03/31/2021   No results found for: HGBA1C    Assessment & Plan:   Problem List Items Addressed This Visit      Cardiovascular and Mediastinum   Coronary artery calcification   Relevant Orders   Lipid panel (Completed)     Respiratory   Non-small cell cancer of right lung ([place])    Other Visit Diagnoses    Mass of chest wall, right    -  Primary   Relevant Orders   CT Chest Wo Contrast   Malignant neoplasm of unspecified part of unspecified bronchus or lung (HCC)       Relevant Orders   CT Abdomen Pelvis W Contrast   CT Abdomen Pelvis Wo Contrast   Intercostal pain       Relevant Orders   CT Chest Wo Contrast   Epigastric abdominal pain       Relevant Orders   Amylase (Completed)   CBC (Completed)   Basic metabolic panel (Completed)   Hepatic function panel (Completed)   Urinalysis, Routine w reflex microscopic (Completed)   Solitary pulmonary nodule       Relevant Orders   CT Chest  W Contrast      No orders of the defined types were placed in this encounter.   Follow-up: Return in about 1 month (around 04/30/2021).   Am hopeful that the CT scan taken in February will be reviewed and her gait the need for the scan that I ordered today. Libby Maw, MD

## 2021-04-01 LAB — URINALYSIS, ROUTINE W REFLEX MICROSCOPIC
Bilirubin Urine: NEGATIVE
Hgb urine dipstick: NEGATIVE
Ketones, ur: NEGATIVE
Nitrite: NEGATIVE
Specific Gravity, Urine: 1.02 (ref 1.000–1.030)
Total Protein, Urine: NEGATIVE
Urine Glucose: NEGATIVE
Urobilinogen, UA: 0.2 (ref 0.0–1.0)
pH: 5 (ref 5.0–8.0)

## 2021-04-02 NOTE — Addendum Note (Signed)
Addended by: Jon Billings on: 04/02/2021 08:05 AM   Modules accepted: Orders

## 2021-04-05 ENCOUNTER — Other Ambulatory Visit: Payer: Self-pay

## 2021-04-05 ENCOUNTER — Ambulatory Visit (HOSPITAL_BASED_OUTPATIENT_CLINIC_OR_DEPARTMENT_OTHER)
Admission: RE | Admit: 2021-04-05 | Discharge: 2021-04-05 | Disposition: A | Discharge status: Home / Self Care | Payer: PPO | Source: Ambulatory Visit | Attending: Family Medicine | Admitting: Family Medicine

## 2021-04-05 ENCOUNTER — Ambulatory Visit (HOSPITAL_BASED_OUTPATIENT_CLINIC_OR_DEPARTMENT_OTHER)
Admission: RE | Admit: 2021-04-05 | Discharge: 2021-04-05 | Disposition: A | Discharge status: Home / Self Care | Payer: PPO | Source: Ambulatory Visit | Attending: Radiation Oncology | Admitting: Radiation Oncology

## 2021-04-05 DIAGNOSIS — C3491 Malignant neoplasm of unspecified part of right bronchus or lung: Secondary | ICD-10-CM | POA: Insufficient documentation

## 2021-04-05 DIAGNOSIS — J439 Emphysema, unspecified: Secondary | ICD-10-CM | POA: Diagnosis not present

## 2021-04-05 DIAGNOSIS — C3490 Malignant neoplasm of unspecified part of unspecified bronchus or lung: Secondary | ICD-10-CM | POA: Diagnosis not present

## 2021-04-05 DIAGNOSIS — R911 Solitary pulmonary nodule: Secondary | ICD-10-CM | POA: Insufficient documentation

## 2021-04-05 DIAGNOSIS — K5900 Constipation, unspecified: Secondary | ICD-10-CM | POA: Diagnosis not present

## 2021-04-05 MED ORDER — IOHEXOL 300 MG/ML  SOLN
100.0000 mL | Freq: Once | INTRAMUSCULAR | Status: AC | PRN
  Administered 2021-04-05: 90 mL via INTRAVENOUS

## 2021-04-08 NOTE — Progress Notes (Signed)
Follow up CT of chest without interval change. It did show copd or smoker's lung. Please stop smoking.

## 2021-05-04 ENCOUNTER — Ambulatory Visit (INDEPENDENT_AMBULATORY_CARE_PROVIDER_SITE_OTHER): Payer: PPO | Admitting: Family Medicine

## 2021-05-04 ENCOUNTER — Encounter: Payer: Self-pay | Admitting: Family Medicine

## 2021-05-04 ENCOUNTER — Other Ambulatory Visit: Payer: Self-pay

## 2021-05-04 VITALS — BP 120/68 | HR 102 | Temp 96.9°F | Ht 65.0 in | Wt 89.8 lb

## 2021-05-04 DIAGNOSIS — R0782 Intercostal pain: Secondary | ICD-10-CM | POA: Insufficient documentation

## 2021-05-04 DIAGNOSIS — R222 Localized swelling, mass and lump, trunk: Secondary | ICD-10-CM | POA: Diagnosis not present

## 2021-05-04 HISTORY — DX: Intercostal pain: R07.82

## 2021-05-04 HISTORY — DX: Localized swelling, mass and lump, trunk: R22.2

## 2021-05-04 NOTE — Progress Notes (Signed)
Established Patient Office Visit  Subjective:  Patient ID: Lindsey Rowland, female    DOB: 06-Nov-1951  Age: 70 y.o. MRN: [id number]  CC:  Chief Complaint  Patient presents with  . Follow-up    Follow up on MRI, patient state that pain on right side of ribs still present.     HPI Lindsey Rowland presents for right lateral chest wall discomfort.  Discomfort occurs whenever she moves through her chest wall or rolls over in bed.  Mass in the posterior chest wall has been stable since her injury back in September.  She has intermittent cough.  Denies fever chills or sputum production.  Denies chest pain with exertion.  Ordered CT of chest fortunately did not show lytic lesion of the chest wall.  There was thickening of the pleura on the right side of her chest that was stable from the prior study.  She has follow-up scheduled with oncology in August and will have another CT scan performed prior to that visit.  Past Medical History:  Diagnosis Date  . Arthritis   . Cancer [facility])    "pre-cervical" cells  - age 7  . COPD (chronic obstructive pulmonary disease) ([place])   . Headache(784.0)   . History of radiation therapy 09/10/2018-09/17/2018   SBRT 3 fx to right lung; Dr. Gery Pray  . Osteopenia   . Polio    as a child  . Ulcer    stomach hx    Past Surgical History:  Procedure Laterality Date  . APPENDECTOMY    . BREAST BIOPSY Right 2013  . CATARACT EXTRACTION     right  . DILATION AND CURETTAGE OF UTERUS    . FOOT SURGERY Bilateral   . hip si joint     injection/ablation  . RETINAL DETACHMENT SURGERY  12/2010   right eye  . TONSILLECTOMY    . VIDEO BRONCHOSCOPY WITH ENDOBRONCHIAL NAVIGATION Right 08/01/2018   Procedure: VIDEO BRONCHOSCOPY WITH ENDOBRONCHIAL NAVIGATION Fiducial Placement;  Surgeon: Collene Gobble, MD;  Location: [facility] OR;  Service: Thoracic;  Laterality: Right;    Family History  Problem Relation Age of Onset  . Hiatal hernia Father   . Colon cancer Neg  Hx   . Esophageal cancer Neg Hx   . Stomach cancer Neg Hx   . Rectal cancer Neg Hx     Social History   Socioeconomic History  . Marital status: Married    Spouse name: Not on file  . Number of children: Not on file  . Years of education: Not on file  . Highest education level: Not on file  Occupational History  . Not on file  Tobacco Use  . Smoking status: Current Every Day Smoker    Packs/day: 0.25    Years: 40.00    Pack years: 10.00    Types: Cigarettes  . Smokeless tobacco: Never Used  . Tobacco comment: currently smoking 1-2 cigs per day  Vaping Use  . Vaping Use: Never used  Substance and Sexual Activity  . Alcohol use: Yes    Alcohol/week: 14.0 standard drinks    Types: 14 Cans of beer per week  . Drug use: No  . Sexual activity: Never  Other Topics Concern  . Not on file  Social History Narrative  . Not on file   Social Determinants of Health   Financial Resource Strain: Not on file  Food Insecurity: Not on file  Transportation Needs: Not on file  Physical Activity: Not on  file  Stress: Not on file  Social Connections: Not on file  Intimate Partner Violence: Not on file    Outpatient Medications Prior to Visit  Medication Sig Dispense Refill  . Aspirin-Salicylamide-Caffeine (BC HEADACHE POWDER PO) Take 1 packet by mouth daily as needed (for pain or headache).     . celecoxib (CELEBREX) 200 MG capsule TAKE 1 CAPSULE BY MOUTH EVERY DAY 90 capsule 0  . cyclobenzaprine (FLEXERIL) 10 MG tablet Take 1 tablet (10 mg total) by mouth at bedtime as needed for muscle spasms. 60 tablet 2  . Multiple Vitamin (MULITIVITAMIN WITH MINERALS) TABS Take 1 tablet by mouth daily.    . pantoprazole (PROTONIX) 40 MG tablet TAKE 1 TABLET BY MOUTH EVERY DAY 90 tablet 0  . Polyethyl Glycol-Propyl Glycol (SYSTANE OP) Place 1 drop into both eyes daily as needed (for dry eyes).    . STIOLTO RESPIMAT 2.5-2.5 MCG/ACT AERS INHALE 2 PUFFS BY MOUTH INTO THE LUNGS DAILY 1 each 5  .  diphenoxylate-atropine (LOMOTIL) 2.5-0.025 MG tablet Take 1 tablet by mouth 4 (four) times daily as needed for diarrhea or loose stools. (Patient not taking: No sig reported) 120 tablet 2   No facility-administered medications prior to visit.    Allergies  Allergen Reactions  . Amoxicillin Diarrhea and Itching    ROS Review of Systems  Constitutional: Negative.   Respiratory: Positive for cough.   Cardiovascular: Negative.   Gastrointestinal: Negative.   Psychiatric/Behavioral: Negative.       Objective:    Physical Exam Vitals and nursing note reviewed.  Constitutional:      General: She is not in acute distress.    Appearance: Normal appearance. She is not toxic-appearing or diaphoretic.  HENT:     Head: Normocephalic and atraumatic.     Right Ear: External ear normal.     Left Ear: External ear normal.     Mouth/Throat:     Mouth: Mucous membranes are dry.     Pharynx: Oropharynx is clear. No oropharyngeal exudate or posterior oropharyngeal erythema.  Eyes:     General:        Right eye: No discharge.        Left eye: No discharge.     Extraocular Movements: Extraocular movements intact.     Conjunctiva/sclera: Conjunctivae normal.     Pupils: Pupils are equal, round, and reactive to light.  Cardiovascular:     Rate and Rhythm: Normal rate and regular rhythm.  Pulmonary:     Effort: Pulmonary effort is normal.     Breath sounds: Decreased breath sounds present.    Abdominal:     General: Bowel sounds are normal. There is no distension.     Palpations: There is no mass.     Tenderness: There is no abdominal tenderness. There is no guarding or rebound.     Hernia: No hernia is present.  Musculoskeletal:     Cervical back: No rigidity or tenderness.  Lymphadenopathy:     Cervical: No cervical adenopathy.  Skin:    General: Skin is warm and dry.     Findings: No rash.  Neurological:     Mental Status: She is alert and oriented to person, place, and time.   Psychiatric:        Mood and Affect: Mood normal.        Behavior: Behavior normal.     BP 120/68   Pulse (!) 102   Temp (!) 96.9 F (36.1 C) (Temporal)  Ht 5\' 5"  (1.651 m)   Wt 89 lb 12.8 oz (40.7 kg)   SpO2 96%   BMI 14.94 kg/m  Wt Readings from Last 3 Encounters:  05/04/21 89 lb 12.8 oz (40.7 kg)  03/31/21 90 lb (40.8 kg)  01/28/21 91 lb 9.6 oz (41.5 kg)     Health Maintenance Due  Topic Date Due  . Zoster Vaccines- Shingrix (1 of 2) Never done    There are no preventive care reminders to display for this patient.  Lab Results  Component Value Date   TSH 0.59 07/19/2017   Lab Results  Component Value Date   WBC 3.8 (L) 03/31/2021   HGB 14.7 03/31/2021   HCT 43.3 03/31/2021   MCV 102.5 (H) 03/31/2021   PLT 222.0 03/31/2021   Lab Results  Component Value Date   NA 140 03/31/2021   K 4.1 Slight hemolysis.Marland Kitchen 03/31/2021   CO2 25 03/31/2021   GLUCOSE 76 03/31/2021   BUN 15 03/31/2021   CREATININE 0.73 03/31/2021   BILITOT 0.4 03/31/2021   ALKPHOS 89 03/31/2021   AST 25 03/31/2021   ALT 12 03/31/2021   PROT 6.8 03/31/2021   ALBUMIN 4.4 03/31/2021   CALCIUM 8.9 03/31/2021   ANIONGAP 10 10/24/2019   GFR 83.16 03/31/2021   Lab Results  Component Value Date   CHOL 163 03/31/2021   Lab Results  Component Value Date   HDL 73.60 03/31/2021   Lab Results  Component Value Date   LDLCALC 80 03/31/2021   Lab Results  Component Value Date   TRIG 46.0 03/31/2021   Lab Results  Component Value Date   CHOLHDL 2 03/31/2021   No results found for: HGBA1C    Assessment & Plan:   Problem List Items Addressed This Visit      Other   Mass of chest wall, right - Primary   Intercostal pain      No orders of the defined types were placed in this encounter.   Follow-up: Return in about 6 months (around 11/03/2021), or Follow up with oncology in August. Consider plain films of right ribs..   Offered plain films of her right chest wall.  She feels  as though she is over the radiated at this point and would like to hold off on that.  She will consider this and let me know. Libby Maw, MD

## 2021-05-04 NOTE — Patient Instructions (Signed)

## 2021-05-15 ENCOUNTER — Other Ambulatory Visit: Payer: Self-pay | Admitting: Family Medicine

## 2021-05-15 DIAGNOSIS — M1990 Unspecified osteoarthritis, unspecified site: Secondary | ICD-10-CM

## 2021-05-15 DIAGNOSIS — Z8612 Personal history of poliomyelitis: Secondary | ICD-10-CM

## 2021-07-19 ENCOUNTER — Telehealth: Payer: Self-pay | Admitting: *Deleted

## 2021-07-19 DIAGNOSIS — Z20828 Contact with and (suspected) exposure to other viral communicable diseases: Secondary | ICD-10-CM | POA: Diagnosis not present

## 2021-07-19 DIAGNOSIS — R0981 Nasal congestion: Secondary | ICD-10-CM | POA: Diagnosis not present

## 2021-07-19 DIAGNOSIS — F17200 Nicotine dependence, unspecified, uncomplicated: Secondary | ICD-10-CM | POA: Diagnosis not present

## 2021-07-19 DIAGNOSIS — R051 Acute cough: Secondary | ICD-10-CM | POA: Diagnosis not present

## 2021-07-19 DIAGNOSIS — J209 Acute bronchitis, unspecified: Secondary | ICD-10-CM | POA: Diagnosis not present

## 2021-07-19 NOTE — Telephone Encounter (Signed)
CALLED PATIENT TO INFORM OF CT FOR 07-28-21- ARRIVAL TIME- 2:45 PM @ [facility], NO RESTRICTIONS TO TEST, PATIENT TO RECEIVE RESULTS FROM DR. KINARD ON 07-29-21 @ 11:15 AM FOR RESULTS, PATIENT VERIFIED UNDERSTANDING THESE APPTS.
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
## 2021-07-28 ENCOUNTER — Ambulatory Visit (HOSPITAL_COMMUNITY)
Admission: RE | Admit: 2021-07-28 | Discharge: 2021-07-28 | Disposition: A | Discharge status: Home / Self Care | Payer: PPO | Source: Ambulatory Visit | Attending: Radiation Oncology | Admitting: Radiation Oncology

## 2021-07-28 ENCOUNTER — Other Ambulatory Visit: Payer: Self-pay

## 2021-07-28 DIAGNOSIS — C3491 Malignant neoplasm of unspecified part of right bronchus or lung: Secondary | ICD-10-CM | POA: Diagnosis not present

## 2021-07-28 DIAGNOSIS — J439 Emphysema, unspecified: Secondary | ICD-10-CM | POA: Diagnosis not present

## 2021-07-28 DIAGNOSIS — C3490 Malignant neoplasm of unspecified part of unspecified bronchus or lung: Secondary | ICD-10-CM | POA: Diagnosis not present

## 2021-07-28 DIAGNOSIS — I700 Atherosclerosis of aorta: Secondary | ICD-10-CM | POA: Diagnosis not present

## 2021-07-29 ENCOUNTER — Ambulatory Visit
Admission: RE | Admit: 2021-07-29 | Discharge: 2021-07-29 | Disposition: A | Discharge status: Home / Self Care | Payer: PPO | Source: Ambulatory Visit | Attending: Radiation Oncology | Admitting: Radiation Oncology

## 2021-07-29 ENCOUNTER — Encounter: Payer: Self-pay | Admitting: Radiation Oncology

## 2021-07-29 VITALS — BP 145/79 | HR 73 | Temp 98.5°F | Resp 18 | Ht 65.0 in

## 2021-07-29 DIAGNOSIS — Z791 Long term (current) use of non-steroidal anti-inflammatories (NSAID): Secondary | ICD-10-CM | POA: Insufficient documentation

## 2021-07-29 DIAGNOSIS — Z08 Encounter for follow-up examination after completed treatment for malignant neoplasm: Secondary | ICD-10-CM | POA: Diagnosis not present

## 2021-07-29 DIAGNOSIS — Z79899 Other long term (current) drug therapy: Secondary | ICD-10-CM | POA: Insufficient documentation

## 2021-07-29 DIAGNOSIS — F17210 Nicotine dependence, cigarettes, uncomplicated: Secondary | ICD-10-CM | POA: Diagnosis not present

## 2021-07-29 DIAGNOSIS — C3491 Malignant neoplasm of unspecified part of right bronchus or lung: Secondary | ICD-10-CM

## 2021-07-29 DIAGNOSIS — R222 Localized swelling, mass and lump, trunk: Secondary | ICD-10-CM

## 2021-07-29 DIAGNOSIS — Z85118 Personal history of other malignant neoplasm of bronchus and lung: Secondary | ICD-10-CM | POA: Insufficient documentation

## 2021-07-29 DIAGNOSIS — Z923 Personal history of irradiation: Secondary | ICD-10-CM | POA: Diagnosis not present

## 2021-07-29 NOTE — Progress Notes (Signed)
Lindsey Rowland is here today for follow up post radiation to the lung.  Lung Side: Right lung, completed SBRT on 09/17/18  Does the patient complain of any of the following: Pain:Patient reports having to right back area under ribs.  Shortness of breath w/wo exertion: Patient reports having shortness of breath on exertion.  Cough: dry Hemoptysis: no Pain with swallowing: no Swallowing/choking concerns: no Appetite: good ,patient reports not being able to gain weight.  Energy Level: mild fatigue Post radiation skin Changes: skin intact.    Additional comments if applicable:   Vitals:   07/29/21 1102  BP: (!) 145/79  Pulse: 73  Resp: 18  Temp: 98.5 F (36.9 C)  SpO2: 94%  Height: 5\' 5"  (1.651 m)

## 2021-08-13 ENCOUNTER — Other Ambulatory Visit: Payer: Self-pay | Admitting: Family Medicine

## 2021-08-13 DIAGNOSIS — M1990 Unspecified osteoarthritis, unspecified site: Secondary | ICD-10-CM

## 2021-08-13 DIAGNOSIS — Z8612 Personal history of poliomyelitis: Secondary | ICD-10-CM

## 2021-08-28 ENCOUNTER — Ambulatory Visit (INDEPENDENT_AMBULATORY_CARE_PROVIDER_SITE_OTHER): Payer: PPO

## 2021-08-28 DIAGNOSIS — Z23 Encounter for immunization: Secondary | ICD-10-CM | POA: Diagnosis not present

## 2021-08-28 DIAGNOSIS — Z0000 Encounter for general adult medical examination without abnormal findings: Secondary | ICD-10-CM | POA: Diagnosis not present

## 2021-08-28 NOTE — Progress Notes (Signed)
Subjective:   Lindsey Rowland is a 70 y.o. female who presents for Medicare Annual (Subsequent) preventive examination.    I connected with  Lindsey Rowland on 08/28/21 by audio enabled telemedicine application and verified that I am speaking with the correct person using two identifiers.   Full name and DOB.  Location of patient:  home Location of provider:  office Persons participating in visit:   Lindsey Rowland and Lindsey Rowland, CMA    I discussed the limitations of evaluation and management by telemedicine. The patient expressed understanding and agreed to proceed.   Review of Systems    Deferred to PCP  Cardiac Risk Factors include: none     Objective:    Today's Vitals   08/28/21 1030  PainSc: 0-No pain   There is no height or weight on file to calculate BMI.  Advanced Directives 08/28/2021 07/29/2021 07/27/2020 01/27/2020 10/24/2019 07/22/2019 01/21/2019  Does Patient Have a Medical Advance Directive? Yes Yes Yes Yes Yes Yes Yes  Type of Advance Directive Living will Living will [place];Living will [place];Living will [place];Living will [place];Living will [place];Living will  Does patient want to make changes to medical advance directive? No - Patient declined No - Patient declined - - - - -  Copy of [place] in Chart? - - - No - copy requested - No - copy requested No - copy requested    Current Medications (verified) Outpatient Encounter Medications as of 08/28/2021  Medication Sig   Aspirin-Salicylamide-Caffeine (BC HEADACHE POWDER PO) Take 1 packet by mouth daily as needed (for pain or headache).    celecoxib (CELEBREX) 200 MG capsule TAKE 1 CAPSULE BY MOUTH EVERY DAY   cyclobenzaprine (FLEXERIL) 10 MG tablet Take 1 tablet (10 mg total) by mouth at bedtime as needed for muscle spasms.   diphenoxylate-atropine (LOMOTIL) 2.5-0.025 MG tablet  Take 1 tablet by mouth 4 (four) times daily as needed for diarrhea or loose stools.   Multiple Vitamin (MULITIVITAMIN WITH MINERALS) TABS Take 1 tablet by mouth daily.   pantoprazole (PROTONIX) 40 MG tablet TAKE 1 TABLET BY MOUTH EVERY DAY   Polyethyl Glycol-Propyl Glycol (SYSTANE OP) Place 1 drop into both eyes daily as needed (for dry eyes).   STIOLTO RESPIMAT 2.5-2.5 MCG/ACT AERS INHALE 2 PUFFS BY MOUTH INTO THE LUNGS DAILY   No facility-administered encounter medications on file as of 08/28/2021.    Allergies (verified) Amoxicillin   History: Past Medical History:  Diagnosis Date   Arthritis    Cancer ([place])    "pre-cervical" cells  - age 70   COPD (chronic obstructive pulmonary disease) ([place])    Headache(784.0)    History of radiation therapy 09/10/2018-09/17/2018   SBRT 3 fx to right lung; Dr. Gery Pray   Osteopenia    Polio    as a child   Ulcer    stomach hx   Past Surgical History:  Procedure Laterality Date   APPENDECTOMY     BREAST BIOPSY Right 2013   CATARACT EXTRACTION     right   DILATION AND CURETTAGE OF UTERUS     FOOT SURGERY Bilateral    hip si joint     injection/ablation   RETINAL DETACHMENT SURGERY  12/2010   right eye   TONSILLECTOMY     VIDEO BRONCHOSCOPY WITH ENDOBRONCHIAL NAVIGATION Right 08/01/2018   Procedure: VIDEO BRONCHOSCOPY WITH ENDOBRONCHIAL NAVIGATION Fiducial Placement;  Surgeon:  Collene Gobble, MD;  Location: [facility];  Service: Thoracic;  Laterality: Right;   Family History  Problem Relation Age of Onset   Hiatal hernia Father    Colon cancer Neg Hx    Esophageal cancer Neg Hx    Stomach cancer Neg Hx    Rectal cancer Neg Hx    Social History   Socioeconomic History   Marital status: Married    Spouse name: Not on file   Number of children: Not on file   Years of education: Not on file   Highest education level: Not on file  Occupational History   Not on file  Tobacco Use   Smoking status: Every Day    Packs/day: 0.25     Years: 40.00    Pack years: 10.00    Types: Cigarettes   Smokeless tobacco: Never   Tobacco comments:    currently smoking 1-2 cigs per day  Vaping Use   Vaping Use: Never used  Substance and Sexual Activity   Alcohol use: Yes    Alcohol/week: 3.0 standard drinks    Types: 3 Cans of beer per week   Drug use: No   Sexual activity: Never  Other Topics Concern   Not on file  Social History Narrative   Not on file   Social Determinants of Health   Financial Resource Strain: Not on file  Food Insecurity: Not on file  Transportation Needs: Not on file  Physical Activity: Not on file  Stress: Not on file  Social Connections: Not on file    Tobacco Counseling Ready to quit: Not Answered Counseling given: Not Answered Tobacco comments: currently smoking 1-2 cigs per day   Clinical Intake:  Pre-visit preparation completed: Yes  Pain : 0-10 Pain Score: 0-No pain Pain Type: Chronic pain Pain Location: Other (Comment) (RT side pain) Pain Orientation: Right Pain Descriptors / Indicators: Aching Pain Onset: More than a month ago Pain Frequency: Occasional     Nutritional Risks: None Diabetes: No  How often do you need to have someone help you when you read instructions, pamphlets, or other written materials from your doctor or pharmacy?: 1 - Never  Diabetic?no  Interpreter Needed?: No  Information entered by :: Lindsey Rowland, [place]   Activities of Daily Living In your present state of health, do you have any difficulty performing the following activities: 08/28/2021  Hearing? N  Vision? Y  Difficulty concentrating or making decisions? N  Walking or climbing stairs? N  Dressing or bathing? N  Doing errands, shopping? N  Preparing Food and eating ? N  Using the Toilet? N  In the past six months, have you accidently leaked urine? N  Do you have problems with loss of bowel control? N  Managing your Medications? N  Managing your Finances? N  Housekeeping or  managing your Housekeeping? N  Some recent data might be hidden    Patient Care Team: Libby Maw, MD as PCP - General (Family Medicine)  Indicate any recent Medical Services you may have received from other than [facility] providers in the past year (date may be approximate).     Assessment:   This is a routine wellness examination for Lindsey Rowland.  Hearing/Vision screen No results found.  Dietary issues and exercise activities discussed: Current Exercise Habits: Home exercise routine, Type of exercise: walking, Time (Minutes): 25, Frequency (Times/Week): 7, Weekly Exercise (Minutes/Week): 175, Intensity: Moderate, Exercise limited by: None identified   Goals Addressed   None  Depression Screen PHQ 2/9 Scores 08/28/2021 05/04/2021 03/31/2021 07/08/2020 08/29/2018 01/22/2018 11/02/2015  PHQ - 2 Score 0 0 0 1 0 0 0  PHQ- 9 Score 3 - - 4 - - -    Fall Risk Fall Risk  08/28/2021 05/04/2021 03/31/2021 10/30/2019 08/29/2018  Falls in the past year? 0 0 1 0 No  Comment - - - Emmi Telephone Survey: data to providers prior to load -  Number falls in past yr: 0 - 1 - -  Comment - - fall in September 2021 - -  Injury with Fall? 0 - 1 - -  Risk Factor Category  - - - - -  Risk for fall due to : No Fall Risks - - - -  Follow up Falls evaluation completed - - - -    FALL RISK PREVENTION PERTAINING TO THE HOME:  Any stairs in or around the home? No  If so, are there any without handrails?  N/a Home free of loose throw rugs in walkways, pet beds, electrical cords, etc? Yes  Adequate lighting in your home to reduce risk of falls? Yes   ASSISTIVE DEVICES UTILIZED TO PREVENT FALLS:  Life alert? No  Use of a cane, walker or w/c? Yes , cane Grab bars in the bathroom? Yes  Shower chair or bench in shower? Yes  Elevated toilet seat or a handicapped toilet? No   TIMED UP AND GO:  Was the test performed? n/a.  Length of time to ambulate 10 feet:  n/a    Cognitive Function:     6CIT  Screen 08/28/2021  What Year? 0 points  What month? 0 points  What time? 0 points  Count back from 20 0 points  Months in reverse 0 points  Repeat phrase 0 points  Total Score 0    Immunizations Immunization History  Administered Date(s) Administered   Influenza Split 09/20/2011, 08/31/2012   Influenza Whole 10/05/2005, 09/26/2007, 09/16/2008, 09/02/2009, 09/30/2010   Influenza, High Dose Seasonal PF 09/05/2017, 08/21/2018, 08/21/2019, 08/12/2020   Influenza,inj,Quad PF,6+ Mos 08/19/2014   Influenza-Unspecified 09/16/2015   Pneumococcal Conjugate-13 11/02/2015   Pneumococcal Polysaccharide-23 01/20/2017   Td 09/04/2003   Tdap 04/23/2012    TDAP status: Up to date  Flu Vaccine status: Up to date  Pneumococcal vaccine status: Up to date  Covid-19 vaccine status: Declined, Education has been provided regarding the importance of this vaccine but patient still declined. Advised may receive this vaccine at local pharmacy or Health Dept.or vaccine clinic. Aware to provide a copy of the vaccination record if obtained from local pharmacy or Health Dept. Verbalized acceptance and understanding.  Qualifies for Shingles Vaccine? No   Zostavax completed No     Screening Tests Health Maintenance  Topic Date Due   Zoster Vaccines- Shingrix (1 of 2) Never done   INFLUENZA VACCINE  07/05/2021   TETANUS/TDAP  04/23/2022   MAMMOGRAM  05/26/2022   COLONOSCOPY (Pts 45-57yrs Insurance coverage will need to be confirmed)  12/25/2024   DEXA SCAN  Completed   Hepatitis C Screening  Completed   HPV VACCINES  Aged Out   COVID-19 Vaccine  Discontinued    Health Maintenance  Health Maintenance Due  Topic Date Due   Zoster Vaccines- Shingrix (1 of 2) Never done   INFLUENZA VACCINE  07/05/2021    Colorectal cancer screening: Type of screening: Colonoscopy. Completed 2012. Repeat every 10 years  Mammogram status: Completed 2021. Repeat every year  Bone Density status: Completed 2012.  Results  reflect: Bone density results: OSTEOPOROSIS. Repeat every   years.  Lung Cancer Screening: (Low Dose CT Chest recommended if Age 26-80 years, 30 pack-year currently smoking OR have quit w/in 15years.) does qualify.   Lung Cancer Screening Referral: no, done every 6 months.  Additional Screening:  Hepatitis C Screening: does not qualify   Vision Screening: Recommended annual ophthalmology exams for early detection of glaucoma and other disorders of the eye. Is the patient up to date with their annual eye exam?  Yes  Who is the provider or what is the name of the office in which the patient attends annual eye exams? Dr Katy Fitch If pt is not established with a provider, would they like to be referred to a provider to establish care? No .   Dental Screening: Recommended annual dental exams for proper oral hygiene  Community Resource Referral / Chronic Care Management: CRR required this visit?  No   CCM required this visit?  No      Plan:     I have personally reviewed and noted the following in the patient's chart:   Medical and social history Use of alcohol, tobacco or illicit drugs  Current medications and supplements including opioid prescriptions.  Functional ability and status Nutritional status Physical activity Advanced directives List of other physicians Hospitalizations, surgeries, and ER visits in previous 12 months Vitals Screenings to include cognitive, depression, and falls Referrals and appointments  In addition, I have reviewed and discussed with patient certain preventive protocols, quality metrics, and best practice recommendations. A written personalized care plan for preventive services as well as general preventive health recommendations were provided to patient.     Konrad Saha, [place]   08/28/2021   Nurse Notes: none face to face 30 mins encounter

## 2021-11-01 ENCOUNTER — Other Ambulatory Visit: Payer: Self-pay | Admitting: Family Medicine

## 2021-11-03 ENCOUNTER — Ambulatory Visit: Payer: PPO | Admitting: Family Medicine

## 2021-11-07 ENCOUNTER — Other Ambulatory Visit: Payer: Self-pay | Admitting: Family Medicine

## 2021-11-07 DIAGNOSIS — Z8612 Personal history of poliomyelitis: Secondary | ICD-10-CM

## 2021-11-07 DIAGNOSIS — M1990 Unspecified osteoarthritis, unspecified site: Secondary | ICD-10-CM

## 2021-11-08 ENCOUNTER — Other Ambulatory Visit: Payer: Self-pay

## 2021-11-08 ENCOUNTER — Ambulatory Visit (INDEPENDENT_AMBULATORY_CARE_PROVIDER_SITE_OTHER): Payer: PPO | Admitting: Family Medicine

## 2021-11-08 ENCOUNTER — Encounter: Payer: Self-pay | Admitting: Family Medicine

## 2021-11-08 ENCOUNTER — Other Ambulatory Visit: Payer: Self-pay | Admitting: Family Medicine

## 2021-11-08 VITALS — BP 136/82 | HR 83 | Temp 97.1°F | Ht 65.0 in | Wt 88.8 lb

## 2021-11-08 DIAGNOSIS — K219 Gastro-esophageal reflux disease without esophagitis: Secondary | ICD-10-CM

## 2021-11-08 DIAGNOSIS — Z8612 Personal history of poliomyelitis: Secondary | ICD-10-CM

## 2021-11-08 DIAGNOSIS — R222 Localized swelling, mass and lump, trunk: Secondary | ICD-10-CM

## 2021-11-08 DIAGNOSIS — J449 Chronic obstructive pulmonary disease, unspecified: Secondary | ICD-10-CM

## 2021-11-08 DIAGNOSIS — M1990 Unspecified osteoarthritis, unspecified site: Secondary | ICD-10-CM | POA: Diagnosis not present

## 2021-11-08 DIAGNOSIS — M129 Arthropathy, unspecified: Secondary | ICD-10-CM

## 2021-11-08 DIAGNOSIS — J309 Allergic rhinitis, unspecified: Secondary | ICD-10-CM

## 2021-11-08 DIAGNOSIS — R030 Elevated blood-pressure reading, without diagnosis of hypertension: Secondary | ICD-10-CM

## 2021-11-08 DIAGNOSIS — R7989 Other specified abnormal findings of blood chemistry: Secondary | ICD-10-CM

## 2021-11-08 HISTORY — DX: Gastro-esophageal reflux disease without esophagitis: K21.9

## 2021-11-08 LAB — COMPREHENSIVE METABOLIC PANEL
ALT: 10 U/L (ref 0–35)
AST: 20 U/L (ref 0–37)
Albumin: 4 g/dL (ref 3.5–5.2)
Alkaline Phosphatase: 62 U/L (ref 39–117)
BUN: 14 mg/dL (ref 6–23)
CO2: 25 mEq/L (ref 19–32)
Calcium: 8.6 mg/dL (ref 8.4–10.5)
Chloride: 107 mEq/L (ref 96–112)
Creatinine, Ser: 0.62 mg/dL (ref 0.40–1.20)
GFR: 89.66 mL/min (ref >60.00)
Glucose, Bld: 76 mg/dL (ref 70–99)
Potassium: 4 mEq/L (ref 3.5–5.1)
Sodium: 140 mEq/L (ref 135–145)
Total Bilirubin: 0.3 mg/dL (ref 0.2–1.2)
Total Protein: 6.1 g/dL (ref 6.0–8.3)

## 2021-11-08 LAB — CBC
HCT: 41.4 % (ref 36.0–46.0)
Hemoglobin: 13.9 g/dL (ref 12.0–15.0)
MCHC: 33.6 g/dL (ref 30.0–36.0)
MCV: 101.7 fl — ABNORMAL HIGH (ref 78.0–100.0)
Platelets: 202 10*3/uL (ref 150.0–400.0)
RBC: 4.07 Mil/uL (ref 3.87–5.11)
RDW: 13.8 % (ref 11.5–15.5)
WBC: 3.7 10*3/uL — ABNORMAL LOW (ref 4.0–10.5)

## 2021-11-08 LAB — T3, FREE: T3, Free: 2.9 pg/mL (ref 2.3–4.2)

## 2021-11-08 LAB — TSH: TSH: 0.57 u[IU]/mL (ref 0.35–5.50)

## 2021-11-08 MED ORDER — PANTOPRAZOLE SODIUM 40 MG PO TBEC: 40.0000 mg | DELAYED_RELEASE_TABLET | Freq: Every day | ORAL | 1 refills | Status: DC

## 2021-11-08 MED ORDER — STIOLTO RESPIMAT 2.5-2.5 MCG/ACT IN AERS: INHALATION_SPRAY | RESPIRATORY_TRACT | 5 refills | Status: DC

## 2021-11-08 MED ORDER — MOMETASONE FUROATE 50 MCG/ACT NA SUSP: 2.0000 | Freq: Every day | NASAL | 12 refills | Status: DC

## 2021-11-08 NOTE — Progress Notes (Addendum)
Established Patient Office Visit  Subjective:  Patient ID: Lindsey Rowland, female    DOB: [birth year]  Age: 70 y.o. MRN: [id number]  CC:  Chief Complaint  Patient presents with   Follow-up    6 month follow up, no concerns. Patient fasting. Patient would like something to help gain weight.     HPI EVALISE ABRUZZESE presents for follow-up of elevated blood pressure, COPD, rhinorrhea, reflux and mass on right posterior chest wall.  Patient feels as though masses been stable it is very tender.  Has indigestion which is responsive to Protonix.  COPD has been stable.  She continues to smoke a couple cigarettes daily.  Anticholinergic with LABA has been helpful.  Denies fever or purulent phlegm production.  Ongoing watery rhinorrhea postnasal drip with some sneezing.  Blood pressure at home has been in the 130s over 80s with occasional excursions up into the 90s.  Her husband is struggling secondary to chronic back and heart issues.  He expresses difficulty gaining weight.  Past Medical History:  Diagnosis Date   Arthritis    Cancer [facility])    "pre-cervical" cells  - age 37   COPD (chronic obstructive pulmonary disease) ([place])    Headache(784.0)    History of radiation therapy 09/10/2018-09/17/2018   SBRT 3 fx to right lung; Dr. Gery Pray   Osteopenia    Polio    as a child   Ulcer    stomach hx    Past Surgical History:  Procedure Laterality Date   APPENDECTOMY     BREAST BIOPSY Right 2013   CATARACT EXTRACTION     right   DILATION AND CURETTAGE OF UTERUS     FOOT SURGERY Bilateral    hip si joint     injection/ablation   RETINAL DETACHMENT SURGERY  12/2010   right eye   TONSILLECTOMY     VIDEO BRONCHOSCOPY WITH ENDOBRONCHIAL NAVIGATION Right 08/01/2018   Procedure: VIDEO BRONCHOSCOPY WITH ENDOBRONCHIAL NAVIGATION Fiducial Placement;  Surgeon: Collene Gobble, MD;  Location: [facility] OR;  Service: Thoracic;  Laterality: Right;    Family History  Problem Relation Age of Onset    Hiatal hernia Father    Colon cancer Neg Hx    Esophageal cancer Neg Hx    Stomach cancer Neg Hx    Rectal cancer Neg Hx     Social History   Socioeconomic History   Marital status: Married    Spouse name: Not on file   Number of children: Not on file   Years of education: Not on file   Highest education level: Not on file  Occupational History   Not on file  Tobacco Use   Smoking status: Every Day    Packs/day: 0.25    Years: 40.00    Pack years: 10.00    Types: Cigarettes   Smokeless tobacco: Never   Tobacco comments:    currently smoking 1-2 cigs per day  Vaping Use   Vaping Use: Never used  Substance and Sexual Activity   Alcohol use: Yes    Alcohol/week: 3.0 standard drinks    Types: 3 Cans of beer per week   Drug use: No   Sexual activity: Never  Other Topics Concern   Not on file  Social History Narrative   Not on file   Social Determinants of Health   Financial Resource Strain: Not on file  Food Insecurity: Not on file  Transportation Needs: Not on file  Physical Activity: Not on  file  Stress: Not on file  Social Connections: Not on file  Intimate Partner Violence: Not on file    Outpatient Medications Prior to Visit  Medication Sig Dispense Refill   Aspirin-Salicylamide-Caffeine (BC HEADACHE POWDER PO) Take 1 packet by mouth daily as needed (for pain or headache).      cyclobenzaprine (FLEXERIL) 10 MG tablet Take 1 tablet (10 mg total) by mouth at bedtime as needed for muscle spasms. 60 tablet 2   diphenoxylate-atropine (LOMOTIL) 2.5-0.025 MG tablet Take 1 tablet by mouth 4 (four) times daily as needed for diarrhea or loose stools. 120 tablet 2   Multiple Vitamin (MULITIVITAMIN WITH MINERALS) TABS Take 1 tablet by mouth daily.     Polyethyl Glycol-Propyl Glycol (SYSTANE OP) Place 1 drop into both eyes daily as needed (for dry eyes).     celecoxib (CELEBREX) 200 MG capsule TAKE 1 CAPSULE BY MOUTH EVERY DAY 90 capsule 0   pantoprazole (PROTONIX) 40 MG  tablet TAKE 1 TABLET BY MOUTH EVERY DAY 90 tablet 0   STIOLTO RESPIMAT 2.5-2.5 MCG/ACT AERS INHALE 2 PUFFS BY MOUTH INTO THE LUNGS DAILY 1 each 5   No facility-administered medications prior to visit.    Allergies  Allergen Reactions   Amoxicillin Diarrhea and Itching    ROS Review of Systems  Constitutional:  Negative for diaphoresis, fatigue, fever and unexpected weight change.  HENT:  Positive for congestion, postnasal drip, rhinorrhea and sneezing.   Eyes:  Negative for photophobia and visual disturbance.  Respiratory:  Positive for cough.   Cardiovascular: Negative.   Gastrointestinal: Negative.   Genitourinary: Negative.   Neurological:  Negative for speech difficulty and weakness.  Psychiatric/Behavioral: Negative.       Objective:    Physical Exam Vitals and nursing note reviewed.  Constitutional:      General: She is not in acute distress.    Appearance: Normal appearance. She is not ill-appearing, toxic-appearing or diaphoretic.  HENT:     Head: Normocephalic and atraumatic.     Right Ear: Tympanic membrane, ear canal and external ear normal.     Left Ear: Tympanic membrane, ear canal and external ear normal.     Mouth/Throat:     Mouth: Mucous membranes are moist.     Pharynx: Oropharynx is clear. No oropharyngeal exudate or posterior oropharyngeal erythema.  Neck:     Vascular: No carotid bruit.  Cardiovascular:     Rate and Rhythm: Normal rate and regular rhythm.  Pulmonary:     Effort: Respiratory distress present.     Breath sounds: No wheezing or rales.  Abdominal:     General: Bowel sounds are normal.  Musculoskeletal:     Cervical back: No rigidity or tenderness.     Right lower leg: No edema.     Left lower leg: No edema.  Lymphadenopathy:     Cervical: No cervical adenopathy.  Skin:    General: Skin is warm and dry.       Neurological:     Mental Status: She is alert and oriented to person, place, and time.  Psychiatric:        Mood and  Affect: Mood normal.        Behavior: Behavior normal.    BP 136/82 (BP Location: Right Arm, Patient Position: Sitting, Cuff Size: Small)   Pulse 83   Temp (!) 97.1 F (36.2 C) (Temporal)   Ht 5\' 5"  (1.651 m)   Wt 88 lb 12.8 oz (40.3 kg)   SpO2  96%   BMI 14.78 kg/m  Wt Readings from Last 3 Encounters:  11/08/21 88 lb 12.8 oz (40.3 kg)  05/04/21 89 lb 12.8 oz (40.7 kg)  03/31/21 90 lb (40.8 kg)     Health Maintenance Due  Topic Date Due   Zoster Vaccines- Shingrix (1 of 2) Never done    There are no preventive care reminders to display for this patient.  Lab Results  Component Value Date   TSH 0.57 11/08/2021   Lab Results  Component Value Date   WBC 3.7 (L) 11/08/2021   HGB 13.9 11/08/2021   HCT 41.4 11/08/2021   MCV 101.7 (H) 11/08/2021   PLT 202.0 11/08/2021   Lab Results  Component Value Date   NA 140 11/08/2021   K 4.0 11/08/2021   CO2 25 11/08/2021   GLUCOSE 76 11/08/2021   BUN 14 11/08/2021   CREATININE 0.62 11/08/2021   BILITOT 0.3 11/08/2021   ALKPHOS 62 11/08/2021   AST 20 11/08/2021   ALT 10 11/08/2021   PROT 6.1 11/08/2021   ALBUMIN 4.0 11/08/2021   CALCIUM 8.6 11/08/2021   ANIONGAP 10 10/24/2019   GFR 89.66 11/08/2021   Lab Results  Component Value Date   CHOL 163 03/31/2021   Lab Results  Component Value Date   HDL 73.60 03/31/2021   Lab Results  Component Value Date   LDLCALC 80 03/31/2021   Lab Results  Component Value Date   TRIG 46.0 03/31/2021   Lab Results  Component Value Date   CHOLHDL 2 03/31/2021   No results found for: HGBA1C    Assessment & Plan:   Problem List Items Addressed This Visit       Respiratory   Allergic rhinitis   Relevant Medications   mometasone (NASONEX) 50 MCG/ACT nasal spray   COPD, mild (HCC)   Relevant Medications   Tiotropium Bromide-Olodaterol (STIOLTO RESPIMAT) 2.5-2.5 MCG/ACT AERS   mometasone (NASONEX) 50 MCG/ACT nasal spray     Digestive   Gastroesophageal reflux disease    Relevant Medications   pantoprazole (PROTONIX) 40 MG tablet     Musculoskeletal and Integument   Arthropathy - Primary   Relevant Medications   celecoxib (CELEBREX) 200 MG capsule     Other   POLIOMYELITIS, HX OF   Relevant Medications   celecoxib (CELEBREX) 200 MG capsule   Mass of chest wall, right   Elevated BP without diagnosis of hypertension   Relevant Orders   Comprehensive metabolic panel (Completed)   Low TSH level   Relevant Orders   TSH (Completed)   CBC (Completed)   T3, free (Completed)   Other Visit Diagnoses     Arthritis       Relevant Medications   celecoxib (CELEBREX) 200 MG capsule       Meds ordered this encounter  Medications   pantoprazole (PROTONIX) 40 MG tablet    Sig: Take 1 tablet (40 mg total) by mouth daily.    Dispense:  90 tablet    Refill:  1   Tiotropium Bromide-Olodaterol (STIOLTO RESPIMAT) 2.5-2.5 MCG/ACT AERS    Sig: INHALE 2 PUFFS BY MOUTH INTO THE LUNGS DAILY    Dispense:  1 each    Refill:  5   mometasone (NASONEX) 50 MCG/ACT nasal spray    Sig: Place 2 sprays into the nose daily.    Dispense:  1 each    Refill:  12   celecoxib (CELEBREX) 200 MG capsule    Sig: TAKE 1 CAPSULE BY  MOUTH EVERY DAY AS NEEDED    Dispense:  90 capsule    Refill:  1    **Patient needs an office visit for further refills.**    Follow-up: Return in about 6 months (around 05/09/2022), or if symptoms worsen or fail to improve.  Mass of chest wall is stable.  Suggested plain films of her back and patient would like to hold off again.  Advised the Shingrix vaccine and she has given information about this.  Advise Nasonex for her postnasal drip and rhinorrhea.  Advised calorie supplementation such as Ensure.  We will recheck TSH and free T3.  Continue Protonix for GERD.  Libby Maw, MD

## 2021-11-09 MED ORDER — CELECOXIB 200 MG PO CAPS: ORAL_CAPSULE | ORAL | 1 refills | Status: DC

## 2021-11-09 NOTE — Addendum Note (Signed)
Addended by: Jon Billings on: 11/09/2021 07:31 AM   Modules accepted: Orders

## 2021-11-24 DIAGNOSIS — J029 Acute pharyngitis, unspecified: Secondary | ICD-10-CM | POA: Diagnosis not present

## 2021-11-24 DIAGNOSIS — F17200 Nicotine dependence, unspecified, uncomplicated: Secondary | ICD-10-CM | POA: Diagnosis not present

## 2021-11-24 DIAGNOSIS — R0981 Nasal congestion: Secondary | ICD-10-CM | POA: Diagnosis not present

## 2021-11-24 DIAGNOSIS — Z20828 Contact with and (suspected) exposure to other viral communicable diseases: Secondary | ICD-10-CM | POA: Diagnosis not present

## 2021-11-24 DIAGNOSIS — R051 Acute cough: Secondary | ICD-10-CM | POA: Diagnosis not present

## 2021-11-24 DIAGNOSIS — J069 Acute upper respiratory infection, unspecified: Secondary | ICD-10-CM | POA: Diagnosis not present

## 2021-12-14 DIAGNOSIS — Z9889 Other specified postprocedural states: Secondary | ICD-10-CM | POA: Diagnosis not present

## 2021-12-14 DIAGNOSIS — H40013 Open angle with borderline findings, low risk, bilateral: Secondary | ICD-10-CM | POA: Diagnosis not present

## 2021-12-14 DIAGNOSIS — H31091 Other chorioretinal scars, right eye: Secondary | ICD-10-CM | POA: Diagnosis not present

## 2021-12-14 DIAGNOSIS — H04123 Dry eye syndrome of bilateral lacrimal glands: Secondary | ICD-10-CM | POA: Diagnosis not present

## 2021-12-14 DIAGNOSIS — H10413 Chronic giant papillary conjunctivitis, bilateral: Secondary | ICD-10-CM | POA: Diagnosis not present

## 2021-12-14 DIAGNOSIS — H2512 Age-related nuclear cataract, left eye: Secondary | ICD-10-CM | POA: Diagnosis not present

## 2021-12-14 DIAGNOSIS — Z961 Presence of intraocular lens: Secondary | ICD-10-CM | POA: Diagnosis not present

## 2022-01-28 ENCOUNTER — Encounter (HOSPITAL_COMMUNITY): Payer: Self-pay

## 2022-01-28 ENCOUNTER — Other Ambulatory Visit: Payer: Self-pay

## 2022-01-28 ENCOUNTER — Ambulatory Visit (HOSPITAL_COMMUNITY)
Admission: RE | Admit: 2022-01-28 | Discharge: 2022-01-28 | Disposition: A | Discharge status: Home / Self Care | Payer: PPO | Source: Ambulatory Visit | Attending: Radiation Oncology | Admitting: Radiation Oncology

## 2022-01-28 DIAGNOSIS — C3490 Malignant neoplasm of unspecified part of unspecified bronchus or lung: Secondary | ICD-10-CM | POA: Diagnosis not present

## 2022-01-28 DIAGNOSIS — C3491 Malignant neoplasm of unspecified part of right bronchus or lung: Secondary | ICD-10-CM | POA: Diagnosis not present

## 2022-01-28 DIAGNOSIS — R0602 Shortness of breath: Secondary | ICD-10-CM | POA: Diagnosis not present

## 2022-01-28 DIAGNOSIS — J439 Emphysema, unspecified: Secondary | ICD-10-CM | POA: Diagnosis not present

## 2022-02-02 NOTE — Progress Notes (Signed)
?Radiation Oncology         ([phone] ?________________________________ ? ?Name: Lindsey Rowland MRN: [id number]  ?Date: 02/03/2022  DOB: 1951-03-18 ? ?Follow-Up Visit Note ? ?CC: Libby Maw, MD  Billie Ruddy, MD ? ?  ICD-10-CM   ?1. Non-small cell cancer of right lung ([place])  C34.91 CT CHEST WO CONTRAST  ?  ? ? ?Diagnosis: Non-Small cell lung cancer, clinical stage I-II  ? ?Interval Since Last Radiation:  3 years, 4 months, and 16 days  ? ?09/10/2018, 09/12/2018, 09/17/2018: Right lung / 54 Gy in 3 fractions (SBRT) ? ?Narrative:  The patient returns today for routine follow-up and to review recent imaging, she was last seen here for follow up on 07/29/21.    ? ?Chest CT on 01/28/22 demonstrated no new or progressive findings to suggest recurrent or metastatic disease.      ? ?Otherwise, no significant interval history since the patient was last seen.  ? ?She denies any pain within the chest area significant cough or hemoptysis.  She denies any worsening of her breathing.  She reports occasional discomfort in the right upper abdominal region.  This is began after she had an accident several months ago.  She had seen Dr. Alfonso Ramus for this issue.               ? ?Allergies:  is allergic to amoxicillin. ? ?Meds: ?Current Outpatient Medications  ?Medication Sig Dispense Refill  ? Aspirin-Salicylamide-Caffeine (BC HEADACHE POWDER PO) Take 1 packet by mouth daily as needed (for pain or headache).     ? celecoxib (CELEBREX) 200 MG capsule TAKE 1 CAPSULE BY MOUTH EVERY DAY AS NEEDED 90 capsule 1  ? cyclobenzaprine (FLEXERIL) 10 MG tablet Take 1 tablet (10 mg total) by mouth at bedtime as needed for muscle spasms. 60 tablet 2  ? mometasone (NASONEX) 50 MCG/ACT nasal spray Place 2 sprays into the nose daily. 1 each 12  ? Multiple Vitamin (MULITIVITAMIN WITH MINERALS) TABS Take 1 tablet by mouth daily.    ? pantoprazole (PROTONIX) 40 MG tablet Take 1 tablet (40 mg total) by mouth daily. 90 tablet 1  ?  Polyethyl Glycol-Propyl Glycol (SYSTANE OP) Place 1 drop into both eyes daily as needed (for dry eyes).    ? Tiotropium Bromide-Olodaterol (STIOLTO RESPIMAT) 2.5-2.5 MCG/ACT AERS INHALE 2 PUFFS BY MOUTH INTO THE LUNGS DAILY 1 each 5  ? diphenoxylate-atropine (LOMOTIL) 2.5-0.025 MG tablet Take 1 tablet by mouth 4 (four) times daily as needed for diarrhea or loose stools. (Patient not taking: Reported on 02/03/2022) 120 tablet 2  ? ?No current facility-administered medications for this encounter.  ? ? ?Physical Findings: ?The patient is in no acute distress. Patient is alert and oriented. ? height is 5\' 5"  (1.651 m) and weight is 89 lb 12.8 oz (40.7 kg). Her temperature is 97.6 ?F (36.4 ?C). Her blood pressure is 155/86 (abnormal) and her pulse is 83. Her respiration is 18 and oxygen saturation is 96%. .   Lungs are clear to auscultation bilaterally. Heart has regular rate and rhythm. No palpable cervical, supraclavicular, or axillary adenopathy. Abdomen soft, non-tender, normal bowel sounds.  ? ? ?Lab Findings: ?Lab Results  ?Component Value Date  ? WBC 3.7 (L) 11/08/2021  ? HGB 13.9 11/08/2021  ? HCT 41.4 11/08/2021  ? MCV 101.7 (H) 11/08/2021  ? PLT 202.0 11/08/2021  ? ? ?Radiographic Findings: ?CT CHEST WO CONTRAST ? ?Result Date: 01/31/2022 ?CLINICAL DATA:  Non-small-cell lung cancer. Restaging. Cough and  shortness of breath. EXAM: CT CHEST WITHOUT CONTRAST TECHNIQUE: Multidetector CT imaging of the chest was performed following the standard protocol without IV contrast. RADIATION DOSE REDUCTION: This exam was performed according to the departmental dose-optimization program which includes automated exposure control, adjustment of the mA and/or kV according to patient size and/or use of iterative reconstruction technique. COMPARISON:  07/28/2021 FINDINGS: Cardiovascular: The heart size is normal. No substantial pericardial effusion. Coronary artery calcification is evident. Moderate atherosclerotic calcification is  noted in the wall of the thoracic aorta. Mediastinum/Nodes: No mediastinal lymphadenopathy. No evidence for gross hilar lymphadenopathy although assessment is limited by the lack of intravenous contrast on the current study. The esophagus has normal imaging features. There is no axillary lymphadenopathy. Lungs/Pleura: Centrilobular and paraseptal emphysema evident. Stable asymmetric biapical pleuroparenchymal scarring. Post treatment scarring at the right base, incorporating the fiducial markers is similar to prior study. 5 mm left lower lobe pulmonary nodule on image 92/series 7 is stable. No new suspicious pulmonary nodule or mass. No focal airspace consolidation. No pleural effusion. Upper Abdomen: The visualized upper abdomen is unremarkable. Musculoskeletal: No worrisome lytic or sclerotic osseous abnormality. Bony changes posterior ninth and tenth right ribs are stable, likely treatment related. IMPRESSION: 1. Stable exam. No new or progressive findings to suggest recurrent or metastatic disease. 2. Aortic Atherosclerosis (ICD10-I70.0) and Emphysema (ICD10-J43.9). Electronically Signed   By: Misty Stanley M.D.   On: 01/31/2022 10:58   ? ?Impression: Non-Small cell lung cancer, clinical stage I-II  ? ?No evidence of recurrence on clinical exam today.  Recent chest CT scan also very encouraging ? ?Plan: Routine follow-up in 6 months.  Prior to this follow-up appointment the patient will undergo a repeat chest CT scan. ? ? ?20 minutes of total time was spent for this patient encounter, including preparation, face-to-face counseling with the patient and coordination of care, physical exam, and documentation of the encounter. ?____________________________________ ? ?Blair Promise, PhD, MD ? ? ?This document serves as a record of services personally performed by Gery Pray, MD. It was created on his behalf by Roney Mans, a trained medical scribe. The creation of this record is based on the scribe's personal  observations and the provider's statements to them. This document has been checked and approved by the attending provider. ? ?

## 2022-02-03 ENCOUNTER — Encounter: Payer: Self-pay | Admitting: Radiation Oncology

## 2022-02-03 ENCOUNTER — Ambulatory Visit
Admission: RE | Admit: 2022-02-03 | Discharge: 2022-02-03 | Disposition: A | Discharge status: Home / Self Care | Payer: PPO | Source: Ambulatory Visit | Attending: Radiation Oncology | Admitting: Radiation Oncology

## 2022-02-03 ENCOUNTER — Other Ambulatory Visit: Payer: Self-pay

## 2022-02-03 DIAGNOSIS — J432 Centrilobular emphysema: Secondary | ICD-10-CM | POA: Diagnosis not present

## 2022-02-03 DIAGNOSIS — Z85118 Personal history of other malignant neoplasm of bronchus and lung: Secondary | ICD-10-CM | POA: Insufficient documentation

## 2022-02-03 DIAGNOSIS — Z923 Personal history of irradiation: Secondary | ICD-10-CM | POA: Diagnosis not present

## 2022-02-03 DIAGNOSIS — Z7951 Long term (current) use of inhaled steroids: Secondary | ICD-10-CM | POA: Diagnosis not present

## 2022-02-03 DIAGNOSIS — Z791 Long term (current) use of non-steroidal anti-inflammatories (NSAID): Secondary | ICD-10-CM | POA: Diagnosis not present

## 2022-02-03 DIAGNOSIS — C3491 Malignant neoplasm of unspecified part of right bronchus or lung: Secondary | ICD-10-CM

## 2022-02-03 DIAGNOSIS — Z08 Encounter for follow-up examination after completed treatment for malignant neoplasm: Secondary | ICD-10-CM | POA: Diagnosis not present

## 2022-02-03 NOTE — Progress Notes (Signed)
Lindsey Rowland is here today for follow up post radiation to the lung. ? ?Lung Side: Right, patient completed treatment on 09/17/18 ? ?Does the patient complain of any of the following: ?Pain: Patient reports having discomfort to right lower abdomen. ?Shortness of breath w/wo exertion: yes ?Cough: yes, dry cough ?Hemoptysis: no ?Pain with swallowing: no ?Swallowing/choking concerns: no ?Appetite: good, patient drinking ensure daily. ?Energy Level: good ?Post radiation skin Changes: no ? ? ? ?Additional comments if applicable:  ?Vitals:  ? 02/03/22 1103  ?BP: (!) 155/86  ?Pulse: 83  ?Resp: 18  ?Temp: 97.6 ?F (36.4 ?C)  ?SpO2: 96%  ?Weight: 89 lb 12.8 oz (40.7 kg)  ?Height: 5\' 5"  (1.651 m)  ?  ?

## 2022-04-06 ENCOUNTER — Other Ambulatory Visit: Payer: Self-pay | Admitting: Family Medicine

## 2022-04-06 DIAGNOSIS — M1990 Unspecified osteoarthritis, unspecified site: Secondary | ICD-10-CM

## 2022-04-06 DIAGNOSIS — K219 Gastro-esophageal reflux disease without esophagitis: Secondary | ICD-10-CM

## 2022-04-06 DIAGNOSIS — Z8612 Personal history of poliomyelitis: Secondary | ICD-10-CM

## 2022-04-06 DIAGNOSIS — M129 Arthropathy, unspecified: Secondary | ICD-10-CM

## 2022-05-02 IMAGING — CT CT CHEST W/O CM
2 of 4 series · 15 of 36 positions shown, 18 images · non-contrast
Comparison: CT chest abdomen and pelvis dated April 05, 2021

CLINICAL DATA: Non-small-cell lung cancer

EXAM:
CT CHEST WITHOUT CONTRAST
TECHNIQUE: Multidetector CT imaging of the chest was performed following the
standard protocol without IV contrast.

[Series 2: thorax · axial · 0.63mm/px · z∈[-109,+195]mm · 12 of 180 slices shown, 15 images]
[im 14/180  mediastinal]
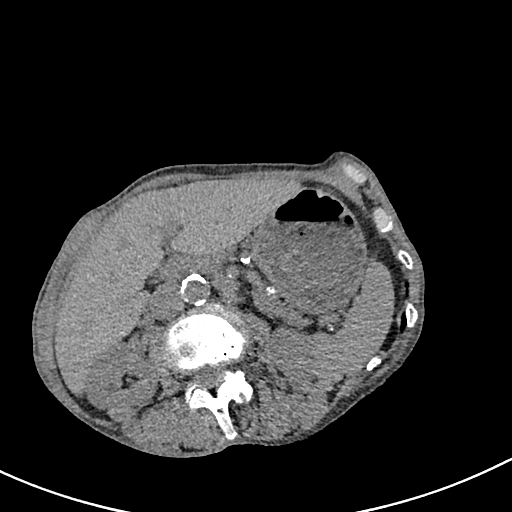
[im 14/180  lung]
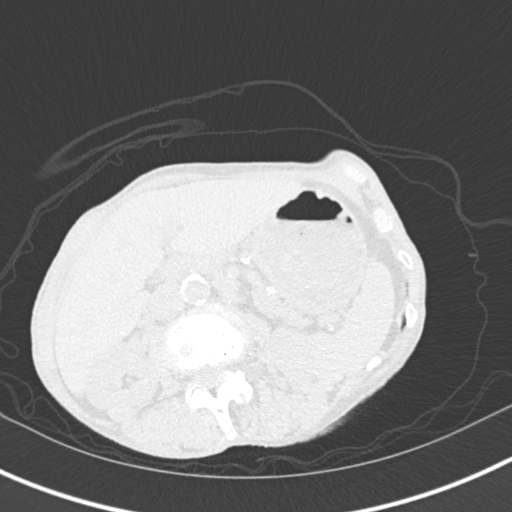
[im 28/180  lung]
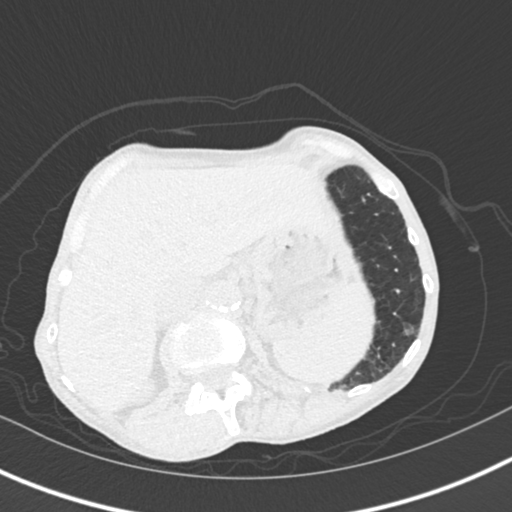
[im 42/180  lung]
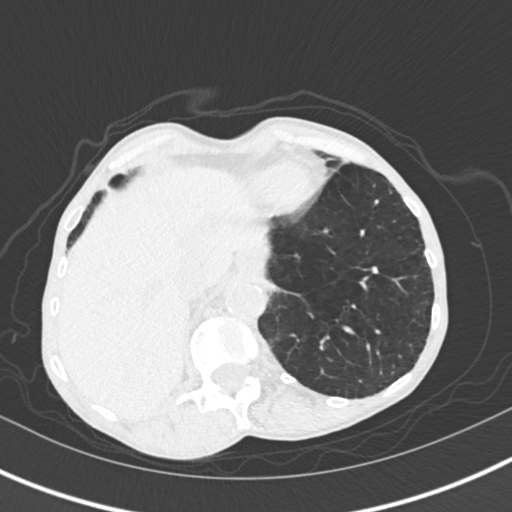
[im 56/180  lung]
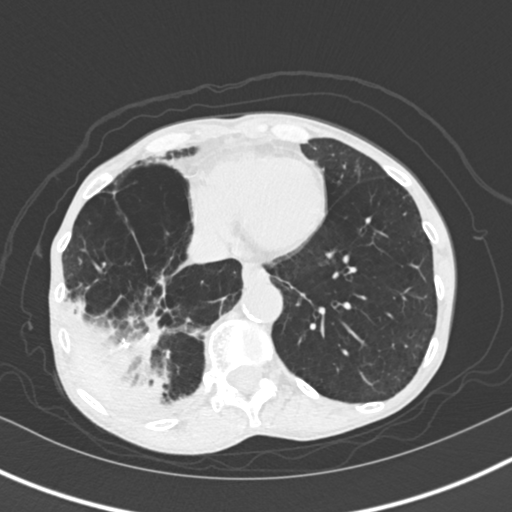
[im 69/180  mediastinal]
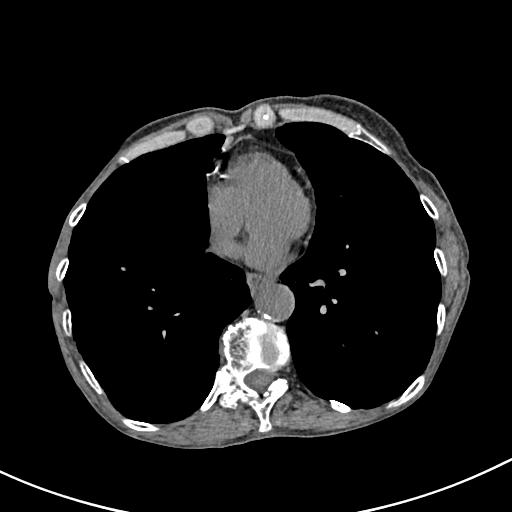
[im 69/180  lung]
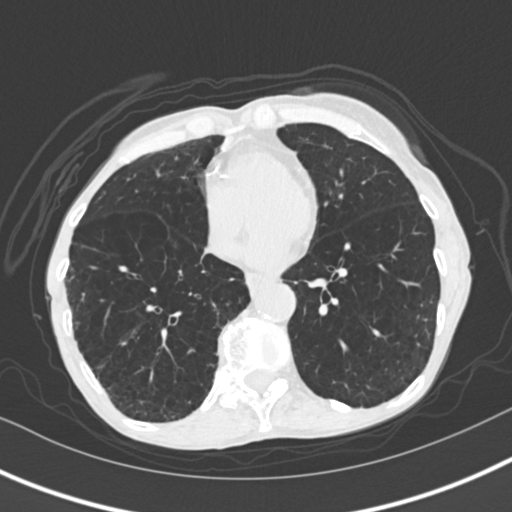
[im 83/180  lung]
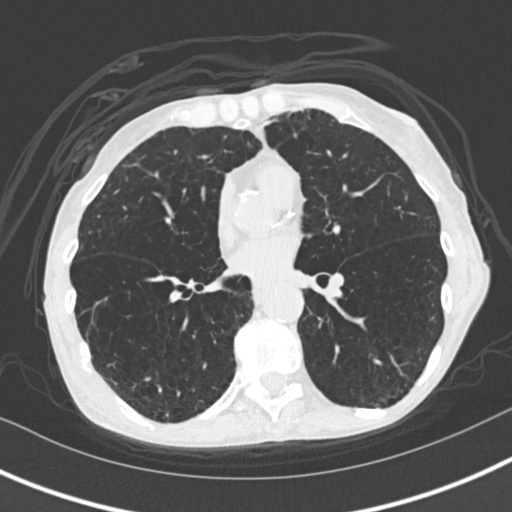
[im 97/180  lung]
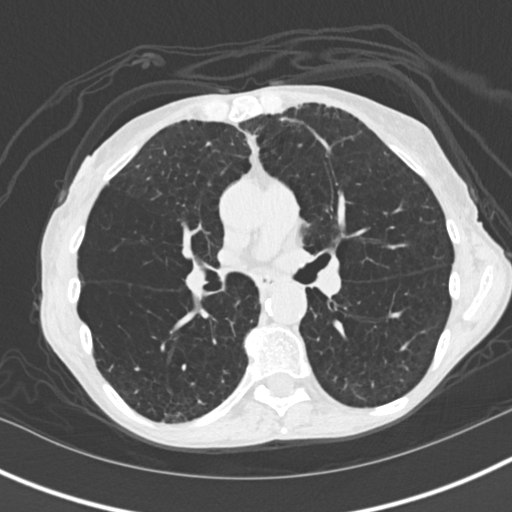
[im 111/180  lung]
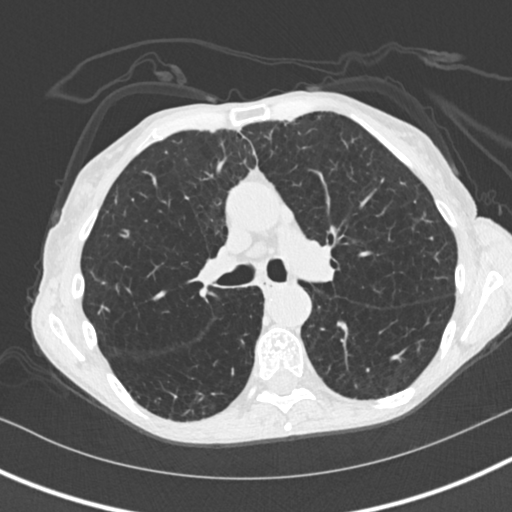
[im 124/180  mediastinal]
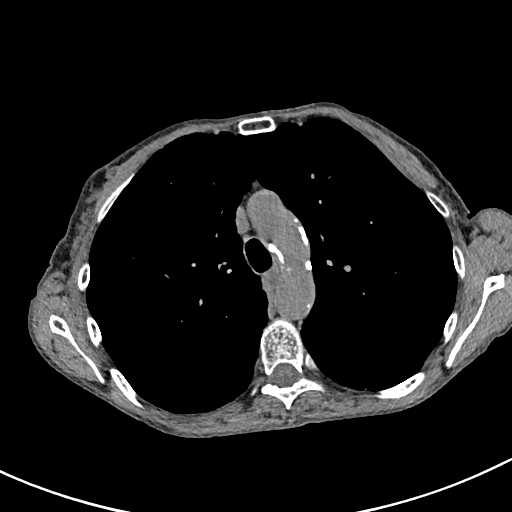
[im 124/180  lung]
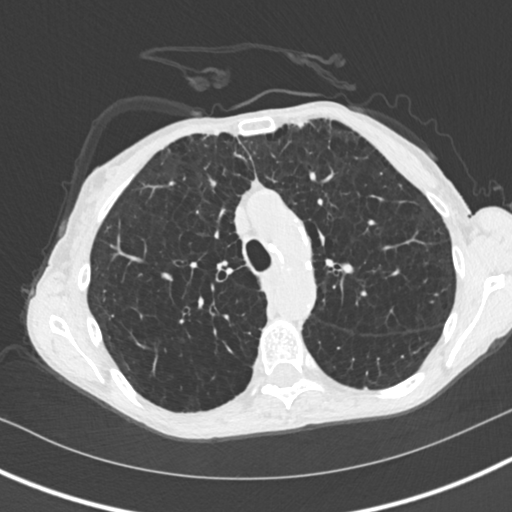
[im 138/180  lung]
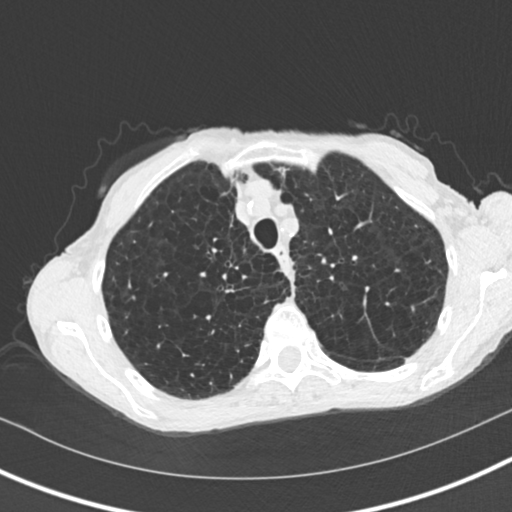
[im 152/180  lung]
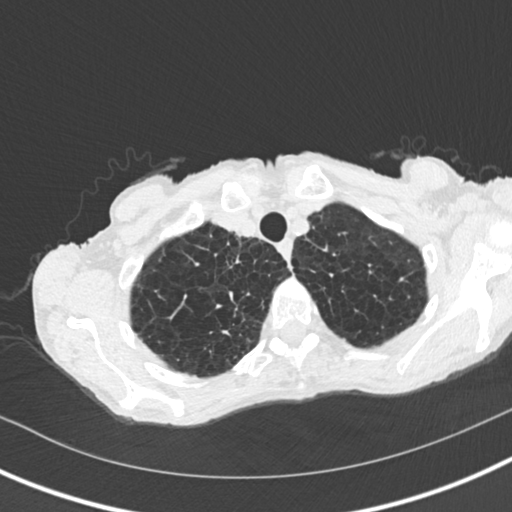
[im 166/180  lung]
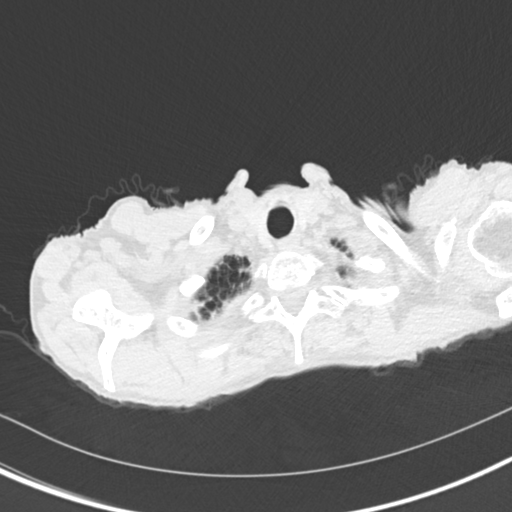

[Series 6: coronal · coronal · 0.60mm/px · 3 of 117 slices shown]
[im 24/117  lung]
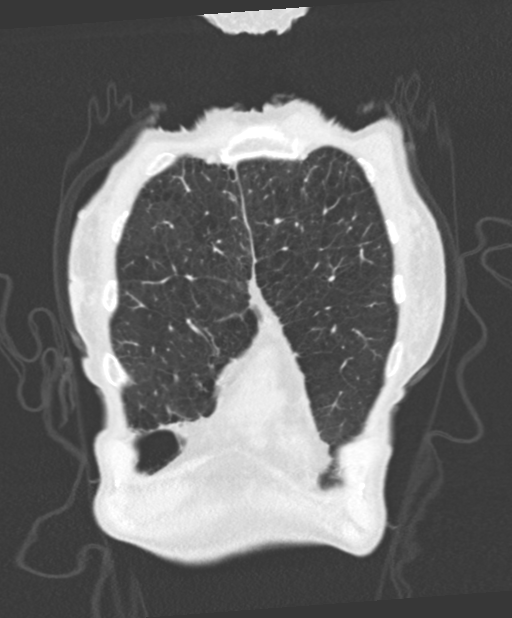
[im 47/117  lung]
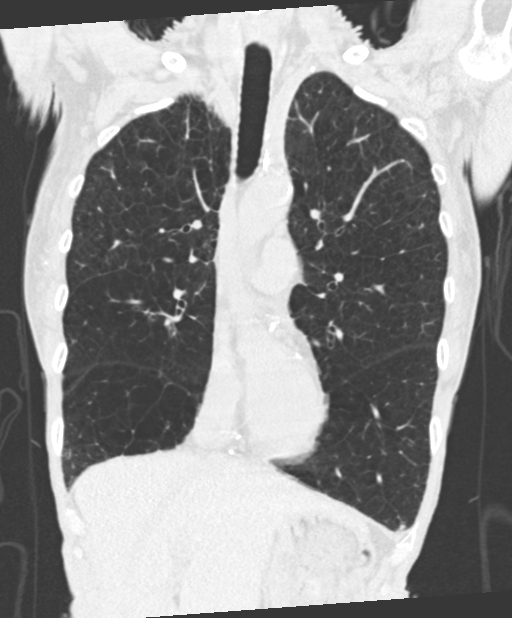
[im 70/117  lung]
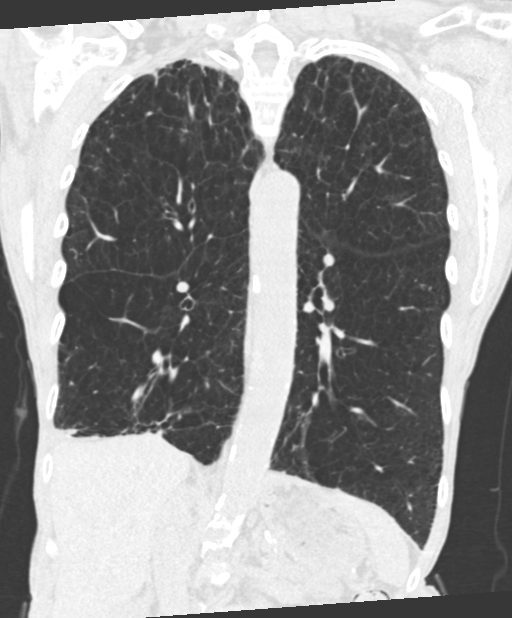

[15 of 36 positions shown; findings below may reference images not displayed]

FINDINGS: Cardiovascular: Normal heart size. No pericardial effusion.
Three-vessel coronary artery calcifications. Atherosclerotic disease
of the thoracic aorta.

Mediastinum/Nodes: Thyroid is unremarkable. Unchanged mild
esophageal wall thickening. No pathologically enlarged lymph nodes
in the chest.

Lungs/Pleura: Central airways are patent. Upper lobe predominant
centrilobular emphysema. Linear consolidation with associated
traction bronchiectasis of the right lower lobe, unchanged compared
to prior and likely post radiation change. Associated metallic
densities, likely fiducial markers.

Upper Abdomen: No acute abnormality.

Musculoskeletal: Unchanged osseous regularity of the posterior left
ribs, likely postradiation changes.
IMPRESSION: Stable postradiation changes of the right lower lobe. No evidence of
recurrent or metastatic disease.

Aortic Atherosclerosis (EVLDI-ZM9.9) and Emphysema (EVLDI-T5Q.T).

## 2022-05-09 ENCOUNTER — Encounter: Payer: Self-pay | Admitting: Family Medicine

## 2022-05-09 ENCOUNTER — Ambulatory Visit (INDEPENDENT_AMBULATORY_CARE_PROVIDER_SITE_OTHER): Payer: PPO | Admitting: Family Medicine

## 2022-05-09 VITALS — BP 116/68 | HR 93 | Temp 98.2°F | Ht 65.0 in | Wt 86.0 lb

## 2022-05-09 DIAGNOSIS — G2581 Restless legs syndrome: Secondary | ICD-10-CM

## 2022-05-09 DIAGNOSIS — F17200 Nicotine dependence, unspecified, uncomplicated: Secondary | ICD-10-CM

## 2022-05-09 DIAGNOSIS — C3491 Malignant neoplasm of unspecified part of right bronchus or lung: Secondary | ICD-10-CM | POA: Diagnosis not present

## 2022-05-09 DIAGNOSIS — J449 Chronic obstructive pulmonary disease, unspecified: Secondary | ICD-10-CM

## 2022-05-09 DIAGNOSIS — K580 Irritable bowel syndrome with diarrhea: Secondary | ICD-10-CM

## 2022-05-09 DIAGNOSIS — R64 Cachexia: Secondary | ICD-10-CM | POA: Diagnosis not present

## 2022-05-09 LAB — COMPREHENSIVE METABOLIC PANEL
ALT: 13 U/L (ref 0–35)
AST: 20 U/L (ref 0–37)
Albumin: 4.2 g/dL (ref 3.5–5.2)
Alkaline Phosphatase: 86 U/L (ref 39–117)
BUN: 15 mg/dL (ref 6–23)
CO2: 26 mEq/L (ref 19–32)
Calcium: 9.8 mg/dL (ref 8.4–10.5)
Chloride: 106 mEq/L (ref 96–112)
Creatinine, Ser: 0.7 mg/dL (ref 0.40–1.20)
GFR: 86.78 mL/min (ref >60.00)
Glucose, Bld: 83 mg/dL (ref 70–99)
Potassium: 3.8 mEq/L (ref 3.5–5.1)
Sodium: 140 mEq/L (ref 135–145)
Total Bilirubin: 0.3 mg/dL (ref 0.2–1.2)
Total Protein: 6.9 g/dL (ref 6.0–8.3)

## 2022-05-09 LAB — CBC
HCT: 42.9 % (ref 36.0–46.0)
Hemoglobin: 14.5 g/dL (ref 12.0–15.0)
MCHC: 33.8 g/dL (ref 30.0–36.0)
MCV: 102.7 fl — ABNORMAL HIGH (ref 78.0–100.0)
Platelets: 262 10*3/uL (ref 150.0–400.0)
RBC: 4.18 Mil/uL (ref 3.87–5.11)
RDW: 14.5 % (ref 11.5–15.5)
WBC: 3.2 10*3/uL — ABNORMAL LOW (ref 4.0–10.5)

## 2022-05-09 MED ORDER — MEGESTROL ACETATE 625 MG/5ML PO SUSP: ORAL | 0 refills | Status: DC

## 2022-05-09 MED ORDER — DIPHENOXYLATE-ATROPINE 2.5-0.025 MG PO TABS: 1.0000 | ORAL_TABLET | Freq: Four times a day (QID) | ORAL | 2 refills | Status: DC | PRN

## 2022-05-09 MED ORDER — AMITRIPTYLINE HCL 50 MG PO TABS: 50.0000 mg | ORAL_TABLET | Freq: Every evening | ORAL | 2 refills | Status: DC | PRN

## 2022-05-09 NOTE — Progress Notes (Signed)
Established Patient Office Visit  Subjective   Patient ID: Lindsey Rowland, female    DOB: Jan 01, 1951  Age: 71 y.o. MRN: [id number]  Chief Complaint  Patient presents with   Follow-up    6 month follow up, no concerns patient fasting. Would like to start on Megastrol to help gain weight.     HPI follow-up and help for weight loss despite adequate nutritional intake.  She is supplementing with Ensure or ice cream and continues to cook for her disabled husband.  She is able to achieve all ADLs at home including mowing her lawn.  Husband is disabled but low back pain.  Continues current therapy for COPD.  Smokes a few cigarettes daily.  Needs follow-up with radiation oncology for non-small cell carcinoma.  Tumor has stabilized. History of irritable bowel with loose stool.  There has been no fever or chills bloody stool).  She has used Flexeril for restless legs at night.    Review of Systems  Constitutional:  Positive for weight loss. Negative for chills, diaphoresis and malaise/fatigue.  HENT: Negative.    Eyes: Negative.  Negative for blurred vision and double vision.  Cardiovascular:  Negative for chest pain.  Gastrointestinal:  Positive for diarrhea. Negative for abdominal pain, blood in stool and melena.  Genitourinary: Negative.   Musculoskeletal:  Negative for falls and myalgias.  Neurological:  Negative for speech change, loss of consciousness and weakness.  Psychiatric/Behavioral: Negative.       Objective:     BP 116/68 (BP Location: Right Arm, Patient Position: Sitting, Cuff Size: Normal)   Pulse 93   Temp 98.2 F (36.8 C) (Temporal)   Ht 5\' 5"  (1.651 m)   Wt 86 lb (39 kg)   SpO2 96%   BMI 14.31 kg/m  Wt Readings from Last 3 Encounters:  05/09/22 86 lb (39 kg)  02/03/22 89 lb 12.8 oz (40.7 kg)  11/08/21 88 lb 12.8 oz (40.3 kg)      Physical Exam Constitutional:      General: She is not in acute distress.    Appearance: Normal appearance. She is cachectic.  She is not ill-appearing, toxic-appearing or diaphoretic.  HENT:     Head: Normocephalic and atraumatic.     Right Ear: External ear normal.     Left Ear: External ear normal.     Mouth/Throat:     Mouth: Mucous membranes are moist.     Pharynx: Oropharynx is clear. No oropharyngeal exudate or posterior oropharyngeal erythema.  Eyes:     General: No scleral icterus.       Right eye: No discharge.        Left eye: No discharge.     Extraocular Movements: Extraocular movements intact.     Conjunctiva/sclera: Conjunctivae normal.     Pupils: Pupils are equal, round, and reactive to light.  Cardiovascular:     Rate and Rhythm: Normal rate and regular rhythm.  Pulmonary:     Effort: Pulmonary effort is normal. No respiratory distress.     Breath sounds: Normal breath sounds.  Abdominal:     General: Bowel sounds are normal.     Tenderness: There is no abdominal tenderness. There is no guarding.  Musculoskeletal:     Cervical back: No rigidity or tenderness.  Skin:    General: Skin is warm and dry.  Neurological:     Mental Status: She is alert and oriented to person, place, and time.  Psychiatric:  Mood and Affect: Mood normal.        Behavior: Behavior normal.     No results found for any visits on 05/09/22.    The 10-year ASCVD risk score (Arnett DK, et al., 2019) is: 12.5%    Assessment & Plan:   Problem List Items Addressed This Visit       Respiratory   COPD, mild ([place])   Relevant Orders   Amb Referral to Palliative Care   Non-small cell cancer of right lung ([place])   Relevant Orders   Amb Referral to Palliative Care     Digestive   IRRITABLE BOWEL SYNDROME   Relevant Medications   diphenoxylate-atropine (LOMOTIL) 2.5-0.025 MG tablet     Other   CIGARETTE SMOKER   Restless leg syndrome   Relevant Medications   amitriptyline (ELAVIL) 50 MG tablet   Other Visit Diagnoses     Cachexia ([place])    -  Primary   Relevant Medications   megestrol (MEGACE  ES) 625 MG/5ML suspension   Other Relevant Orders   Amb Referral to Palliative Care   CBC   Comprehensive metabolic panel   Prealbumin       No follow-ups on file.  Agrees for palliative care consultation.  We discussed the difference between palliative care and hospice care.  We will try Megace to stimulate her appetite further.  She was given information on this as well as care.  Refill Lomotil.  She rarely uses this medication.  We will try Elavil Flexeril.  Libby Maw, MD

## 2022-05-10 LAB — PREALBUMIN: Prealbumin: 24 mg/dL (ref 17–34)

## 2022-05-12 ENCOUNTER — Telehealth: Payer: Self-pay

## 2022-05-12 NOTE — Telephone Encounter (Signed)
Spoke with patient regarding Palliative Care services. She declined services. Will cancel referral and notify referring provider.

## 2022-05-31 ENCOUNTER — Other Ambulatory Visit: Payer: Self-pay | Admitting: Family Medicine

## 2022-05-31 DIAGNOSIS — G2581 Restless legs syndrome: Secondary | ICD-10-CM

## 2022-07-11 ENCOUNTER — Telehealth: Payer: Self-pay | Admitting: Family Medicine

## 2022-07-11 NOTE — Telephone Encounter (Signed)
Caller Name: Daughter Call back phone #: [phone]  Reason for Call: Pt Is loosing weight fast. She was prescribed megastrol but pharmacy will not fill due to PA.

## 2022-07-15 NOTE — Telephone Encounter (Signed)
PA approved patient and daughter aware also informed to give [place] a call if no improvement.

## 2022-07-22 ENCOUNTER — Telehealth: Payer: Self-pay | Admitting: *Deleted

## 2022-07-22 NOTE — Telephone Encounter (Signed)
CALLED PATIENT TO INFORM OF CT FOR 08-05-22 - ARRIVALTIME- 12:30 PM @ [facility], NO RESTRICTIONS TO TEST, PATIENT TO RECEIVE CT RESULTS FROM DR. KINARD ON 08-11-22 @ 11AM, SPOKE WITH PATIENT AND SHE IS AWARE OF THESE APPTS. AND THE INSTRUCTIONS

## 2022-08-01 DIAGNOSIS — E46 Unspecified protein-calorie malnutrition: Secondary | ICD-10-CM | POA: Diagnosis not present

## 2022-08-01 DIAGNOSIS — J439 Emphysema, unspecified: Secondary | ICD-10-CM | POA: Diagnosis not present

## 2022-08-01 DIAGNOSIS — I10 Essential (primary) hypertension: Secondary | ICD-10-CM | POA: Diagnosis not present

## 2022-08-01 DIAGNOSIS — G2581 Restless legs syndrome: Secondary | ICD-10-CM | POA: Diagnosis not present

## 2022-08-01 DIAGNOSIS — G8929 Other chronic pain: Secondary | ICD-10-CM | POA: Diagnosis not present

## 2022-08-01 DIAGNOSIS — H04123 Dry eye syndrome of bilateral lacrimal glands: Secondary | ICD-10-CM | POA: Diagnosis not present

## 2022-08-01 DIAGNOSIS — G14 Postpolio syndrome: Secondary | ICD-10-CM | POA: Diagnosis not present

## 2022-08-01 DIAGNOSIS — I739 Peripheral vascular disease, unspecified: Secondary | ICD-10-CM | POA: Diagnosis not present

## 2022-08-01 DIAGNOSIS — I700 Atherosclerosis of aorta: Secondary | ICD-10-CM | POA: Diagnosis not present

## 2022-08-01 DIAGNOSIS — R64 Cachexia: Secondary | ICD-10-CM | POA: Diagnosis not present

## 2022-08-01 DIAGNOSIS — G63 Polyneuropathy in diseases classified elsewhere: Secondary | ICD-10-CM | POA: Diagnosis not present

## 2022-08-01 DIAGNOSIS — F17210 Nicotine dependence, cigarettes, uncomplicated: Secondary | ICD-10-CM | POA: Diagnosis not present

## 2022-08-02 ENCOUNTER — Telehealth: Payer: Self-pay | Admitting: *Deleted

## 2022-08-02 NOTE — Telephone Encounter (Signed)
CALLED PATIENT TO ASK ABOUT ALTERING FU ON 08-11-22 DUE TO DR. KINARD BEING ON VACATION, SPOKE WITH PATIENT AND SHE AGREED TO COME ON 08-18-22 @ 11:30 AM

## 2022-08-05 ENCOUNTER — Ambulatory Visit (HOSPITAL_COMMUNITY)
Admission: RE | Admit: 2022-08-05 | Discharge: 2022-08-05 | Disposition: A | Discharge status: Home / Self Care | Payer: PPO | Source: Ambulatory Visit | Attending: Radiation Oncology | Admitting: Radiation Oncology

## 2022-08-05 DIAGNOSIS — C3491 Malignant neoplasm of unspecified part of right bronchus or lung: Secondary | ICD-10-CM

## 2022-08-05 DIAGNOSIS — J439 Emphysema, unspecified: Secondary | ICD-10-CM | POA: Diagnosis not present

## 2022-08-05 DIAGNOSIS — R911 Solitary pulmonary nodule: Secondary | ICD-10-CM | POA: Diagnosis not present

## 2022-08-05 DIAGNOSIS — C3490 Malignant neoplasm of unspecified part of unspecified bronchus or lung: Secondary | ICD-10-CM | POA: Diagnosis not present

## 2022-08-11 ENCOUNTER — Ambulatory Visit: Payer: Self-pay | Admitting: Radiation Oncology

## 2022-08-13 ENCOUNTER — Other Ambulatory Visit: Payer: Self-pay | Admitting: Family Medicine

## 2022-08-13 DIAGNOSIS — K580 Irritable bowel syndrome with diarrhea: Secondary | ICD-10-CM

## 2022-08-17 NOTE — Progress Notes (Signed)
Radiation Oncology         [phone] ________________________________  Name: Lindsey Rowland MRN: [id number]  Date: 08/18/2022  DOB: 1951/11/29  Follow-Up Visit Note  CC: Libby Maw, MD  Billie Ruddy, MD    ICD-10-CM   1. Non-small cell cancer of right lung [facility])  C34.91 CT CHEST WO CONTRAST      Diagnosis: Non-Small cell lung cancer, clinical stage I-II   Interval Since Last Radiation: 3 years and 11 month   09/10/2018, 09/12/2018, 09/17/2018: Right lung / 54 Gy in 3 fractions (SBRT)  Narrative:  The patient returns today for routine follow-up and to review recent imaging, she was last seen here for follow up on 02/03/22.   Her most recent chest CT on 08/05/22 showed the 5 mm left lower lobe pulmonary nodule as stable in the interval, and stable post radiation fibrosis in the right base. No new or progressive findings were appreciated overall.                She reports no new medical issues.  She gets somewhat short of breath when outside in the heat but otherwise denies any significant breathing issues.  She denies any pain within the chest area significant cough or hemoptysis.    She does report that Megace did not really help her appetite but did cause problems with diarrhea and she has subsequently stopped this medication.               Allergies:  is allergic to amoxicillin.  Meds: Current Outpatient Medications  Medication Sig Dispense Refill   amitriptyline (ELAVIL) 50 MG tablet TAKE 1 TABLET BY MOUTH AT BEDTIME AS NEEDED FOR SLEEP. 90 tablet 1   Aspirin-Salicylamide-Caffeine (BC HEADACHE POWDER PO) Take 1 packet by mouth daily as needed (for pain or headache).      celecoxib (CELEBREX) 200 MG capsule TAKE 1 CAPSULE BY MOUTH EVERY DAY AS NEEDED 90 capsule 1   diphenoxylate-atropine (LOMOTIL) 2.5-0.025 MG tablet TAKE 1 TABLET BY MOUTH 4 (FOUR) TIMES DAILY AS NEEDED FOR DIARRHEA OR LOOSE STOOLS. 120 tablet 2   mometasone (NASONEX) 50 MCG/ACT nasal  spray Place 2 sprays into the nose daily. 1 each 12   Multiple Vitamin (MULITIVITAMIN WITH MINERALS) TABS Take 1 tablet by mouth daily.     pantoprazole (PROTONIX) 40 MG tablet TAKE 1 TABLET BY MOUTH EVERY DAY 90 tablet 1   Polyethyl Glycol-Propyl Glycol (SYSTANE OP) Place 1 drop into both eyes daily as needed (for dry eyes).     Tiotropium Bromide-Olodaterol (STIOLTO RESPIMAT) 2.5-2.5 MCG/ACT AERS INHALE 2 PUFFS BY MOUTH INTO THE LUNGS DAILY 1 each 5   megestrol (MEGACE ES) 625 MG/5ML suspension Take 1/2 tsp daily. (Patient not taking: Reported on 08/18/2022) 150 mL 0   No current facility-administered medications for this encounter.    Physical Findings: The patient is in no acute distress. Patient is alert and oriented.  height is 5' 0.5" (1.537 m) and weight is 81 lb 6 oz (36.9 kg). Her oral temperature is 98.3 F (36.8 C). Her blood pressure is 138/64 and her pulse is 77. Her respiration is 18 and oxygen saturation is 98%. .  No significant changes. Lungs are clear to auscultation bilaterally. Heart has regular rate and rhythm. No palpable cervical, supraclavicular, or axillary adenopathy. Abdomen soft, non-tender, normal bowel sounds.   Lab Findings: Lab Results  Component Value Date   WBC 3.2 (L) 05/09/2022   HGB 14.5 05/09/2022   HCT  42.9 05/09/2022   MCV 102.7 (H) 05/09/2022   PLT 262.0 05/09/2022    Radiographic Findings: CT CHEST WO CONTRAST  Result Date: 08/08/2022 CLINICAL DATA:  Non-small-cell lung cancer. Restaging. * Tracking Code: BO * EXAM: CT CHEST WITHOUT CONTRAST TECHNIQUE: Multidetector CT imaging of the chest was performed following the standard protocol without IV contrast. RADIATION DOSE REDUCTION: This exam was performed according to the departmental dose-optimization program which includes automated exposure control, adjustment of the mA and/or kV according to patient size and/or use of iterative reconstruction technique. COMPARISON:  01/28/2022 FINDINGS:  Cardiovascular: The heart size is normal. No substantial pericardial effusion. Coronary artery calcification is evident. Moderate atherosclerotic calcification is noted in the wall of the thoracic aorta. Mediastinum/Nodes: No mediastinal lymphadenopathy. Upper normal 8 mm short axis precarinal lymph node is stable. No evidence for gross hilar lymphadenopathy although assessment is limited by the lack of intravenous contrast on the current study. The esophagus has normal imaging features. There is no axillary lymphadenopathy. Lungs/Pleura: Centrilobular and paraseptal emphysema evident. Posterior right apical pleuroparenchymal scarring is stable. Post radiation fibrosis in the right base is stable in the interval with fiducial markers evident. 5 mm left lower lobe pulmonary nodule (90/7) is stable. No new suspicious pulmonary nodule or mass. No focal airspace consolidation. No pleural effusion. Upper Abdomen: Unremarkable. Musculoskeletal: No worrisome lytic or sclerotic osseous abnormality. Stable cortical irregularity right posterior ninth and tenth ribs, likely treatment related. IMPRESSION: 1. Stable exam. No new or progressive findings. 2. Stable post radiation fibrosis in the right base. 3. Stable 5 mm left lower lobe pulmonary nodule. Continued attention on follow-up recommended 4. Aortic Atherosclerosis (ICD10-I70.0) and Emphysema (ICD10-J43.9). Electronically Signed   By: Misty Stanley M.D.   On: 08/08/2022 10:25    Impression:  Non-Small cell lung cancer, clinical stage I-II   No evidence of recurrence on clinical exam today.  Recent chest CT scan favorable.  Plan: She will return for routine follow-up in 6 months.  Prior to this follow-up appointment the patient will have a repeat chest CT scan.   20 minutes of total time was spent for this patient encounter, including preparation, face-to-face counseling with the patient and coordination of care, physical exam, and documentation of the  encounter. ____________________________________  Blair Promise, PhD, MD  This document serves as a record of services personally performed by Gery Pray, MD. It was created on his behalf by Roney Mans, a trained medical scribe. The creation of this record is based on the scribe's personal observations and the provider's statements to them. This document has been checked and approved by the attending provider.

## 2022-08-18 ENCOUNTER — Ambulatory Visit
Admission: RE | Admit: 2022-08-18 | Discharge: 2022-08-18 | Disposition: A | Discharge status: Home / Self Care | Payer: PPO | Source: Ambulatory Visit | Attending: Radiation Oncology | Admitting: Radiation Oncology

## 2022-08-18 DIAGNOSIS — Z79899 Other long term (current) drug therapy: Secondary | ICD-10-CM | POA: Insufficient documentation

## 2022-08-18 DIAGNOSIS — Z85118 Personal history of other malignant neoplasm of bronchus and lung: Secondary | ICD-10-CM | POA: Insufficient documentation

## 2022-08-18 DIAGNOSIS — C3491 Malignant neoplasm of unspecified part of right bronchus or lung: Secondary | ICD-10-CM

## 2022-08-18 DIAGNOSIS — Z791 Long term (current) use of non-steroidal anti-inflammatories (NSAID): Secondary | ICD-10-CM | POA: Insufficient documentation

## 2022-08-18 DIAGNOSIS — Z7951 Long term (current) use of inhaled steroids: Secondary | ICD-10-CM | POA: Diagnosis not present

## 2022-08-18 DIAGNOSIS — J701 Chronic and other pulmonary manifestations due to radiation: Secondary | ICD-10-CM | POA: Diagnosis not present

## 2022-08-18 DIAGNOSIS — J432 Centrilobular emphysema: Secondary | ICD-10-CM | POA: Diagnosis not present

## 2022-08-18 DIAGNOSIS — C3432 Malignant neoplasm of lower lobe, left bronchus or lung: Secondary | ICD-10-CM | POA: Diagnosis not present

## 2022-08-18 DIAGNOSIS — Z923 Personal history of irradiation: Secondary | ICD-10-CM | POA: Diagnosis not present

## 2022-08-18 DIAGNOSIS — R911 Solitary pulmonary nodule: Secondary | ICD-10-CM | POA: Insufficient documentation

## 2022-08-18 NOTE — Progress Notes (Addendum)
Lindsey Rowland is here today for follow up post radiation to the lung.  Lung Side: right  Does the patient complain of any of the following: Pain:no Shortness of breath w/wo exertion: yes but has trouble when it is hot outside. Cough: occasional dry cough Hemoptysis: no Pain with swallowing: no Swallowing/choking concerns: no Appetite: normal Energy Level: fair Post radiation skin Changes: no    Additional comments if applicable: None noted  BP 138/64 (BP Location: Right Arm, Patient Position: Sitting, Cuff Size: Normal)   Pulse 77   Temp 98.3 F (36.8 C) (Oral)   Resp 18   Ht 5' 0.5" (1.537 m)   Wt 81 lb 6 oz (36.9 kg)   SpO2 98%   BMI 15.63 kg/m

## 2022-09-09 ENCOUNTER — Other Ambulatory Visit: Payer: Self-pay | Admitting: Family Medicine

## 2022-09-09 DIAGNOSIS — R64 Cachexia: Secondary | ICD-10-CM

## 2022-09-16 DIAGNOSIS — M79631 Pain in right forearm: Secondary | ICD-10-CM | POA: Diagnosis not present

## 2022-09-16 DIAGNOSIS — R2231 Localized swelling, mass and lump, right upper limb: Secondary | ICD-10-CM | POA: Diagnosis not present

## 2022-09-16 DIAGNOSIS — S51811A Laceration without foreign body of right forearm, initial encounter: Secondary | ICD-10-CM | POA: Diagnosis not present

## 2022-09-16 DIAGNOSIS — L03113 Cellulitis of right upper limb: Secondary | ICD-10-CM | POA: Diagnosis not present

## 2022-09-21 ENCOUNTER — Ambulatory Visit (INDEPENDENT_AMBULATORY_CARE_PROVIDER_SITE_OTHER): Payer: PPO

## 2022-09-21 ENCOUNTER — Telehealth: Payer: Self-pay | Admitting: Family Medicine

## 2022-09-21 VITALS — Ht 64.0 in | Wt 82.0 lb

## 2022-09-21 DIAGNOSIS — Z0000 Encounter for general adult medical examination without abnormal findings: Secondary | ICD-10-CM | POA: Diagnosis not present

## 2022-09-21 NOTE — Telephone Encounter (Signed)
Patient scheduled to come in for evaluation

## 2022-09-21 NOTE — Telephone Encounter (Signed)
Pt state that can not take amitriptyline it give her diarrhea ,can you give her a different medicine  and also want to be prescribe the pt albuterol inhaler

## 2022-09-21 NOTE — Progress Notes (Signed)
Subjective:   TAVI GAUGHRAN is a 71 y.o. female who presents for Medicare Annual (Subsequent) preventive examination.   I connected with  [name] on 09/21/22 by a audio enabled telemedicine application and verified that I am speaking with the correct person using two identifiers.  Patient Location: Home  Provider Location: Home Office  I discussed the limitations of evaluation and management by telemedicine. The patient expressed understanding and agreed to proceed.  Review of Systems     Cardiac Risk Factors include: advanced age (>22men, >85 women);hypertension     Objective:    Today's Vitals   09/21/22 1423  Weight: 82 lb (37.2 kg)  Height: 5\' 4"  (1.626 m)   Body mass index is 14.08 kg/m.     09/21/2022    2:27 PM 08/18/2022   11:22 AM 02/03/2022   11:09 AM 08/28/2021   10:42 AM 07/29/2021   11:05 AM 07/27/2020    2:44 PM 01/27/2020   11:24 AM  Advanced Directives  Does Patient Have a Medical Advance Directive? Yes Yes Yes Yes Yes Yes Yes  Type of Paramedic of [place];Living will [place];Living will  Living will Living will [place];Living will [place];Living will  Does patient want to make changes to medical advance directive?   No - Patient declined No - Patient declined No - Patient declined    Copy of [place] in Chart? No - copy requested      No - copy requested    Current Medications (verified) Outpatient Encounter Medications as of 09/21/2022  Medication Sig   Aspirin-Salicylamide-Caffeine (BC HEADACHE POWDER PO) Take 1 packet by mouth daily as needed (for pain or headache).    celecoxib (CELEBREX) 200 MG capsule TAKE 1 CAPSULE BY MOUTH EVERY DAY AS NEEDED   diphenoxylate-atropine (LOMOTIL) 2.5-0.025 MG tablet TAKE 1 TABLET BY MOUTH 4 (FOUR) TIMES DAILY AS NEEDED FOR DIARRHEA OR LOOSE STOOLS.   megestrol (MEGACE ES) 625 MG/5ML suspension TAKE  1/2 TSP DAILY.   mometasone (NASONEX) 50 MCG/ACT nasal spray Place 2 sprays into the nose daily.   Multiple Vitamin (MULITIVITAMIN WITH MINERALS) TABS Take 1 tablet by mouth daily.   pantoprazole (PROTONIX) 40 MG tablet TAKE 1 TABLET BY MOUTH EVERY DAY   Polyethyl Glycol-Propyl Glycol (SYSTANE OP) Place 1 drop into both eyes daily as needed (for dry eyes).   Tiotropium Bromide-Olodaterol (STIOLTO RESPIMAT) 2.5-2.5 MCG/ACT AERS INHALE 2 PUFFS BY MOUTH INTO THE LUNGS DAILY   amitriptyline (ELAVIL) 50 MG tablet TAKE 1 TABLET BY MOUTH AT BEDTIME AS NEEDED FOR SLEEP. (Patient not taking: Reported on 09/21/2022)   No facility-administered encounter medications on file as of 09/21/2022.    Allergies (verified) Amoxicillin   History: Past Medical History:  Diagnosis Date   Arthritis    Cancer ([place])    "pre-cervical" cells  - age 23   COPD (chronic obstructive pulmonary disease) ([place])    Headache(784.0)    History of radiation therapy 09/10/2018-09/17/2018   SBRT 3 fx to right lung; Dr. Gery Pray   Osteopenia    Polio    as a child   Ulcer    stomach hx   Past Surgical History:  Procedure Laterality Date   APPENDECTOMY     BREAST BIOPSY Right 2013   CATARACT EXTRACTION     right   DILATION AND CURETTAGE OF UTERUS     FOOT SURGERY Bilateral    hip  si joint     injection/ablation   RETINAL DETACHMENT SURGERY  12/2010   right eye   TONSILLECTOMY     VIDEO BRONCHOSCOPY WITH ENDOBRONCHIAL NAVIGATION Right 08/01/2018   Procedure: VIDEO BRONCHOSCOPY WITH ENDOBRONCHIAL NAVIGATION Fiducial Placement;  Surgeon: Collene Gobble, MD;  Location: [facility] OR;  Service: Thoracic;  Laterality: Right;   Family History  Problem Relation Age of Onset   Hiatal hernia Father    Colon cancer Neg Hx    Esophageal cancer Neg Hx    Stomach cancer Neg Hx    Rectal cancer Neg Hx    Social History   Socioeconomic History   Marital status: Married    Spouse name: Not on file   Number of children: Not  on file   Years of education: Not on file   Highest education level: Not on file  Occupational History   Not on file  Tobacco Use   Smoking status: Every Day    Packs/day: 0.25    Years: 40.00    Total pack years: 10.00    Types: Cigarettes   Smokeless tobacco: Never   Tobacco comments:    currently smoking 1-2 cigs per day  Vaping Use   Vaping Use: Never used  Substance and Sexual Activity   Alcohol use: Yes    Alcohol/week: 3.0 standard drinks of alcohol    Types: 3 Cans of beer per week   Drug use: No   Sexual activity: Never  Other Topics Concern   Not on file  Social History Narrative   Not on file   Social Determinants of Health   Financial Resource Strain: Low Risk  (09/21/2022)   Overall Financial Resource Strain (CARDIA)    Difficulty of Paying Living Expenses: Not hard at all  Food Insecurity: No Food Insecurity (09/21/2022)   Hunger Vital Sign    Worried About Running Out of Food in the Last Year: Never true    Ran Out of Food in the Last Year: Never true  Transportation Needs: No Transportation Needs (09/21/2022)   PRAPARE - Hydrologist (Medical): No    Lack of Transportation (Non-Medical): No  Physical Activity: Insufficiently Active (09/21/2022)   Exercise Vital Sign    Days of Exercise per Week: 3 days    Minutes of Exercise per Session: 30 min  Stress: No Stress Concern Present (09/21/2022)   [place]    Feeling of Stress : Not at all  Social Connections: Moderately Isolated (09/21/2022)   Social Connection and Isolation Panel [NHANES]    Frequency of Communication with Friends and Family: More than three times a week    Frequency of Social Gatherings with Friends and Family: More than three times a week    Attends Religious Services: Never    Marine scientist or Organizations: No    Attends Music therapist: Never    Marital Status:  Married    Tobacco Counseling Ready to quit: Not Answered Counseling given: Not Answered Tobacco comments: currently smoking 1-2 cigs per day   Clinical Intake:  Pre-visit preparation completed: Yes  Pain : No/denies pain     Nutritional Risks: None Diabetes: No  How often do you need to have someone help you when you read instructions, pamphlets, or other written materials from your doctor or pharmacy?: 1 - Never  Diabetic?no   Interpreter Needed?: No  Information entered by :: Jadene Pierini, LPN  Activities of Daily Living    09/21/2022    2:27 PM  In your present state of health, do you have any difficulty performing the following activities:  Hearing? 0  Vision? 0  Difficulty concentrating or making decisions? 0  Walking or climbing stairs? 0  Dressing or bathing? 0  Doing errands, shopping? 0  Preparing Food and eating ? N  Using the Toilet? N  In the past six months, have you accidently leaked urine? N  Do you have problems with loss of bowel control? N  Managing your Medications? N  Managing your Finances? N  Housekeeping or managing your Housekeeping? N    Patient Care Team: Libby Maw, MD as PCP - General (Family Medicine)  Indicate any recent Medical Services you may have received from other than [facility] providers in the past year (date may be approximate).     Assessment:   This is a routine wellness examination for [place].  Hearing/Vision screen Vision Screening - Comments:: Annual eye exams wear glasses   Dietary issues and exercise activities discussed: Current Exercise Habits: Home exercise routine, Type of exercise: walking, Time (Minutes): 30, Frequency (Times/Week): 3, Weekly Exercise (Minutes/Week): 90, Intensity: Mild, Exercise limited by: None identified   Goals Addressed             This Visit's Progress    DIET - INCREASE WATER INTAKE         Depression Screen    09/21/2022    2:26 PM 05/09/2022   10:07 AM  11/08/2021    9:26 AM 08/28/2021   10:00 AM 05/04/2021    1:58 PM 03/31/2021   10:59 AM 07/08/2020    1:48 PM  PHQ 2/9 Scores  PHQ - 2 Score 0 0 0 0 0 0 1  PHQ- 9 Score    3   4    Fall Risk    05/09/2022   10:07 AM 11/08/2021    9:26 AM 08/28/2021   10:00 AM 05/04/2021    1:58 PM 03/31/2021   10:59 AM  Fall Risk   Falls in the past year? 0 0 0 0 1  Number falls in past yr: 0 0 0  1  Comment     fall in September 2021  Injury with Fall?   0  1  Risk for fall due to :   No Fall Risks    Follow up   Falls evaluation completed      [place]:  Any stairs in or around the home? Yes  If so, are there any without handrails? No  Home free of loose throw rugs in walkways, pet beds, electrical cords, etc? Yes  Adequate lighting in your home to reduce risk of falls? Yes   ASSISTIVE DEVICES UTILIZED TO PREVENT FALLS:  Life alert? No  Use of a cane, walker or w/c? No  Grab bars in the bathroom? Yes  Shower chair or bench in shower? Yes  Elevated toilet seat or a handicapped toilet? Yes          09/21/2022    2:28 PM 08/28/2021   10:43 AM  6CIT Screen  What Year? 0 points 0 points  What month? 0 points 0 points  What time? 0 points 0 points  Count back from 20 0 points 0 points  Months in reverse 0 points 0 points  Repeat phrase 0 points 0 points  Total Score 0 points 0 points  Immunizations Immunization History  Administered Date(s) Administered   Fluad Quad(high Dose 65+) 08/18/2022   Influenza Split 09/20/2011, 08/31/2012   Influenza Whole 10/05/2005, 09/26/2007, 09/16/2008, 09/02/2009, 09/30/2010   Influenza, High Dose Seasonal PF 09/05/2017, 08/21/2018, 08/21/2019, 08/12/2020   Influenza,inj,Quad PF,6+ Mos 08/19/2014   Influenza-Unspecified 09/16/2015, 08/10/2021   Pneumococcal Conjugate-13 11/02/2015   Pneumococcal Polysaccharide-23 01/20/2017   Td 09/04/2003   Tdap 04/23/2012    TDAP status: Up to date  Flu Vaccine status: Up  to date  Pneumococcal vaccine status: Up to date  Covid-19 vaccine status: Completed vaccines  Qualifies for Shingles Vaccine? Yes   Zostavax completed No   Shingrix Completed?: No.    Education has been provided regarding the importance of this vaccine. Patient has been advised to call insurance company to determine out of pocket expense if they have not yet received this vaccine. Advised may also receive vaccine at local pharmacy or Health Dept. Verbalized acceptance and understanding.  Screening Tests Health Maintenance  Topic Date Due   Zoster Vaccines- Shingrix (1 of 2) Never done   MAMMOGRAM  05/26/2021   TETANUS/TDAP  05/10/2023 (Originally 04/23/2022)   COLONOSCOPY (Pts 45-62yrs Insurance coverage will need to be confirmed)  12/25/2024   Pneumonia Vaccine 56+ Years old  Completed   INFLUENZA VACCINE  Completed   DEXA SCAN  Completed   Hepatitis C Screening  Completed   HPV VACCINES  Aged Out   COVID-19 Vaccine  Discontinued    Health Maintenance  Health Maintenance Due  Topic Date Due   Zoster Vaccines- Shingrix (1 of 2) Never done   MAMMOGRAM  05/26/2021    Colorectal cancer screening: Type of screening: Colonoscopy. Completed 12/25/2014. Repeat every 10 years  Mammogram status: Ordered declined at this time . Pt provided with contact info and advised to call to schedule appt.   Bone Density status: Completed 11/141/2018. Results reflect: Bone density results: OSTEOPENIA. Repeat every 10 years.  Lung Cancer Screening: (Low Dose CT Chest recommended if Age 62-80 years, 30 pack-year currently smoking OR have quit w/in 15years.) does qualify.   Lung Cancer Screening Referral: Currently has Ct Dr.Kimmer   Additional Screening:  Hepatitis C Screening: does not qualify;   Vision Screening: Recommended annual ophthalmology exams for early detection of glaucoma and other disorders of the eye. Is the patient up to date with their annual eye exam?  Yes  Who is the  provider or what is the name of the office in which the patient attends annual eye exams? Dr.Groat  If pt is not established with a provider, would they like to be referred to a provider to establish care? No .   Dental Screening: Recommended annual dental exams for proper oral hygiene  Community Resource Referral / Chronic Care Management: CRR required this visit?  No   CCM required this visit?  No      Plan:     I have personally reviewed and noted the following in the patient's chart:   Medical and social history Use of alcohol, tobacco or illicit drugs  Current medications and supplements including opioid prescriptions. Patient is not currently taking opioid prescriptions. Functional ability and status Nutritional status Physical activity Advanced directives List of other physicians Hospitalizations, surgeries, and ER visits in previous 12 months Vitals Screenings to include cognitive, depression, and falls Referrals and appointments  In addition, I have reviewed and discussed with patient certain preventive protocols, quality metrics, and best practice recommendations. A written personalized care plan for preventive services  as well as general preventive health recommendations were provided to patient.     Daphane Shepherd, LPN   [id number]   Nurse Notes: Declines Mammogram at this time

## 2022-09-21 NOTE — Patient Instructions (Signed)
Lindsey Rowland , Thank you for taking time to come for your Medicare Wellness Visit. I appreciate your ongoing commitment to your health goals. Please review the following plan we discussed and let me know if I can assist you in the future.   These are the goals we discussed:  Goals      DIET - INCREASE WATER INTAKE        This is a list of the screening recommended for you and due dates:  Health Maintenance  Topic Date Due   Zoster (Shingles) Vaccine (1 of 2) Never done   Mammogram  05/26/2021   Tetanus Vaccine  05/10/2023*   Colon Cancer Screening  12/25/2024   Pneumonia Vaccine  Completed   Flu Shot  Completed   DEXA scan (bone density measurement)  Completed   Hepatitis C Screening: USPSTF Recommendation to screen - Ages 72-72 yo.  Completed   HPV Vaccine  Aged Out   COVID-19 Vaccine  Discontinued  *Topic was postponed. The date shown is not the original due date.    Advanced directives: Please bring a copy of your health care power of attorney and living will to the office to be added to your chart at your convenience.   Conditions/risks identified: Aim for 30 minutes of exercise or brisk walking, 6-8 glasses of water, and 5 servings of fruits and vegetables each day.   Next appointment: Follow up in one year for your annual wellness visit    Preventive Care 65 Years and Older, Female Preventive care refers to lifestyle choices and visits with your health care provider that can promote health and wellness. What does preventive care include? A yearly physical exam. This is also called an annual well check. Dental exams once or twice a year. Routine eye exams. Ask your health care provider how often you should have your eyes checked. Personal lifestyle choices, including: Daily care of your teeth and gums. Regular physical activity. Eating a healthy diet. Avoiding tobacco and drug use. Limiting alcohol use. Practicing safe sex. Taking low-dose aspirin every day. Taking  vitamin and mineral supplements as recommended by your health care provider. What happens during an annual well check? The services and screenings done by your health care provider during your annual well check will depend on your age, overall health, lifestyle risk factors, and family history of disease. Counseling  Your health care provider may ask you questions about your: Alcohol use. Tobacco use. Drug use. Emotional well-being. Home and relationship well-being. Sexual activity. Eating habits. History of falls. Memory and ability to understand (cognition). Work and work Statistician. Reproductive health. Screening  You may have the following tests or measurements: Height, weight, and BMI. Blood pressure. Lipid and cholesterol levels. These may be checked every 5 years, or more frequently if you are over 34 years old. Skin check. Lung cancer screening. You may have this screening every year starting at age 72 if you have a 30-pack-year history of smoking and currently smoke or have quit within the past 15 years. Fecal occult blood test (FOBT) of the stool. You may have this test every year starting at age 32. Flexible sigmoidoscopy or colonoscopy. You may have a sigmoidoscopy every 5 years or a colonoscopy every 10 years starting at age 15. Hepatitis C blood test. Hepatitis B blood test. Sexually transmitted disease (STD) testing. Diabetes screening. This is done by checking your blood sugar (glucose) after you have not eaten for a while (fasting). You may have this done every 1-3  years. Bone density scan. This is done to screen for osteoporosis. You may have this done starting at age 72 Mammogram. This may be done every 1-2 years. Talk to your health care provider about how often you should have regular mammograms. Talk with your health care provider about your test results, treatment options, and if necessary, the need for more tests. Vaccines  Your health care provider may  recommend certain vaccines, such as: Influenza vaccine. This is recommended every year. Tetanus, diphtheria, and acellular pertussis (Tdap, Td) vaccine. You may need a Td booster every 10 years. Zoster vaccine. You may need this after age 27. Pneumococcal 13-valent conjugate (PCV13) vaccine. One dose is recommended after age 21. Pneumococcal polysaccharide (PPSV23) vaccine. One dose is recommended after age 70. Talk to your health care provider about which screenings and vaccines you need and how often you need them. This information is not intended to replace advice given to you by your health care provider. Make sure you discuss any questions you have with your health care provider. Document Released: 12/18/2015 Document Revised: 08/10/2016 Document Reviewed: 09/22/2015 Elsevier Interactive Patient Education  2017 [employer] Prevention in the Home Falls can cause injuries. They can happen to people of all ages. There are many things you can do to make your home safe and to help prevent falls. What can I do on the outside of my home? Regularly fix the edges of walkways and driveways and fix any cracks. Remove anything that might make you trip as you walk through a door, such as a raised step or threshold. Trim any bushes or trees on the path to your home. Use bright outdoor lighting. Clear any walking paths of anything that might make someone trip, such as rocks or tools. Regularly check to see if handrails are loose or broken. Make sure that both sides of any steps have handrails. Any raised decks and porches should have guardrails on the edges. Have any leaves, snow, or ice cleared regularly. Use sand or salt on walking paths during winter. Clean up any spills in your garage right away. This includes oil or grease spills. What can I do in the bathroom? Use night lights. Install grab bars by the toilet and in the tub and shower. Do not use towel bars as grab bars. Use non-skid  mats or decals in the tub or shower. If you need to sit down in the shower, use a plastic, non-slip stool. Keep the floor dry. Clean up any water that spills on the floor as soon as it happens. Remove soap buildup in the tub or shower regularly. Attach bath mats securely with double-sided non-slip rug tape. Do not have throw rugs and other things on the floor that can make you trip. What can I do in the bedroom? Use night lights. Make sure that you have a light by your bed that is easy to reach. Do not use any sheets or blankets that are too big for your bed. They should not hang down onto the floor. Have a firm chair that has side arms. You can use this for support while you get dressed. Do not have throw rugs and other things on the floor that can make you trip. What can I do in the kitchen? Clean up any spills right away. Avoid walking on wet floors. Keep items that you use a lot in easy-to-reach places. If you need to reach something above you, use a strong step stool that has a grab  bar. Keep electrical cords out of the way. Do not use floor polish or wax that makes floors slippery. If you must use wax, use non-skid floor wax. Do not have throw rugs and other things on the floor that can make you trip. What can I do with my stairs? Do not leave any items on the stairs. Make sure that there are handrails on both sides of the stairs and use them. Fix handrails that are broken or loose. Make sure that handrails are as long as the stairways. Check any carpeting to make sure that it is firmly attached to the stairs. Fix any carpet that is loose or worn. Avoid having throw rugs at the top or bottom of the stairs. If you do have throw rugs, attach them to the floor with carpet tape. Make sure that you have a light switch at the top of the stairs and the bottom of the stairs. If you do not have them, ask someone to add them for you. What else can I do to help prevent falls? Wear shoes  that: Do not have high heels. Have rubber bottoms. Are comfortable and fit you well. Are closed at the toe. Do not wear sandals. If you use a stepladder: Make sure that it is fully opened. Do not climb a closed stepladder. Make sure that both sides of the stepladder are locked into place. Ask someone to hold it for you, if possible. Clearly mark and make sure that you can see: Any grab bars or handrails. First and last steps. Where the edge of each step is. Use tools that help you move around (mobility aids) if they are needed. These include: Canes. Walkers. Scooters. Crutches. Turn on the lights when you go into a dark area. Replace any light bulbs as soon as they burn out. Set up your furniture so you have a clear path. Avoid moving your furniture around. If any of your floors are uneven, fix them. If there are any pets around you, be aware of where they are. Review your medicines with your doctor. Some medicines can make you feel dizzy. This can increase your chance of falling. Ask your doctor what other things that you can do to help prevent falls. This information is not intended to replace advice given to you by your health care provider. Make sure you discuss any questions you have with your health care provider. Document Released: 09/17/2009 Document Revised: 04/28/2016 Document Reviewed: 12/26/2014 Elsevier Interactive Patient Education  2017 [employer].

## 2022-09-22 ENCOUNTER — Encounter: Payer: Self-pay | Admitting: Family Medicine

## 2022-09-22 ENCOUNTER — Ambulatory Visit (INDEPENDENT_AMBULATORY_CARE_PROVIDER_SITE_OTHER): Payer: PPO | Admitting: Family Medicine

## 2022-09-22 VITALS — BP 130/80 | HR 86 | Temp 98.5°F | Ht 64.0 in | Wt 83.2 lb

## 2022-09-22 DIAGNOSIS — G2581 Restless legs syndrome: Secondary | ICD-10-CM | POA: Diagnosis not present

## 2022-09-22 DIAGNOSIS — J449 Chronic obstructive pulmonary disease, unspecified: Secondary | ICD-10-CM | POA: Diagnosis not present

## 2022-09-22 DIAGNOSIS — F17200 Nicotine dependence, unspecified, uncomplicated: Secondary | ICD-10-CM

## 2022-09-22 DIAGNOSIS — F17210 Nicotine dependence, cigarettes, uncomplicated: Secondary | ICD-10-CM

## 2022-09-22 DIAGNOSIS — C3491 Malignant neoplasm of unspecified part of right bronchus or lung: Secondary | ICD-10-CM | POA: Diagnosis not present

## 2022-09-22 MED ORDER — STIOLTO RESPIMAT 2.5-2.5 MCG/ACT IN AERS: INHALATION_SPRAY | RESPIRATORY_TRACT | 5 refills | Status: DC

## 2022-09-22 MED ORDER — GABAPENTIN 300 MG PO CAPS: 300.0000 mg | ORAL_CAPSULE | Freq: Three times a day (TID) | ORAL | 3 refills | Status: DC

## 2022-09-22 MED ORDER — ALBUTEROL SULFATE HFA 108 (90 BASE) MCG/ACT IN AERS: 1.0000 | INHALATION_SPRAY | Freq: Four times a day (QID) | RESPIRATORY_TRACT | 2 refills | Status: DC | PRN

## 2022-09-22 MED ORDER — ALBUTEROL SULFATE HFA 108 (90 BASE) MCG/ACT IN AERS: INHALATION_SPRAY | RESPIRATORY_TRACT | 0 refills | Status: DC

## 2022-09-22 NOTE — Progress Notes (Signed)
Established Patient Office Visit  Subjective   Patient ID: Lindsey SALAMON, female    DOB: 1951/08/09  Age: 71 y.o. MRN: [id number]  Chief Complaint  Patient presents with   Follow-up    Follow up wants to discuss medication not fasting.    HPI for follow-up of restless leg syndrome and COPD.  Continues follow-up with radiation oncology for non-small cell cancer.  She asked for a rescue inhaler to use on occasion with Stiolto.  She has not tolerated amitriptyline for restless legs.  She continues to be active working out in her yard and independent.  She has a daughter who lives close by    Review of Systems  Constitutional: Negative.   HENT: Negative.    Eyes:  Negative for blurred vision, discharge and redness.  Respiratory: Negative.    Cardiovascular: Negative.   Gastrointestinal:  Negative for abdominal pain.  Genitourinary: Negative.   Musculoskeletal: Negative.  Negative for myalgias.  Skin:  Negative for rash.  Neurological:  Negative for tingling, loss of consciousness and weakness.  Endo/Heme/Allergies:  Negative for polydipsia.      Objective:     BP 130/80 (BP Location: Right Arm, Patient Position: Sitting, Cuff Size: Small)   Pulse 86   Temp 98.5 F (36.9 C) (Temporal)   Ht 5\' 4"  (1.626 m)   Wt 83 lb 3.2 oz (37.7 kg)   SpO2 93%   BMI 14.28 kg/m  Wt Readings from Last 3 Encounters:  09/22/22 83 lb 3.2 oz (37.7 kg)  09/21/22 82 lb (37.2 kg)  08/18/22 81 lb 6 oz (36.9 kg)      Physical Exam Constitutional:      General: She is not in acute distress.    Appearance: Normal appearance. She is not ill-appearing, toxic-appearing or diaphoretic.  HENT:     Head: Normocephalic and atraumatic.     Right Ear: External ear normal.     Left Ear: External ear normal.     Mouth/Throat:     Mouth: Mucous membranes are moist.     Pharynx: Oropharynx is clear. No oropharyngeal exudate or posterior oropharyngeal erythema.  Eyes:     General: No scleral  icterus.       Right eye: No discharge.        Left eye: No discharge.     Extraocular Movements: Extraocular movements intact.     Conjunctiva/sclera: Conjunctivae normal.     Pupils: Pupils are equal, round, and reactive to light.  Cardiovascular:     Rate and Rhythm: Normal rate and regular rhythm.  Pulmonary:     Effort: Pulmonary effort is normal. No respiratory distress.     Breath sounds: Normal breath sounds. Decreased air movement present.  Abdominal:     General: Bowel sounds are normal.     Tenderness: There is no abdominal tenderness. There is no guarding.  Musculoskeletal:     Cervical back: No rigidity or tenderness.  Skin:    General: Skin is warm and dry.  Neurological:     Mental Status: She is alert and oriented to person, place, and time.  Psychiatric:        Mood and Affect: Mood normal.        Behavior: Behavior normal.      No results found for any visits on 09/22/22.    The 10-year ASCVD risk score (Arnett DK, et al., 2019) is: 17.1%    Assessment & Plan:   Problem List Items Addressed This Visit  Respiratory   COPD, mild (HCC) - Primary   Relevant Medications   Tiotropium Bromide-Olodaterol (STIOLTO RESPIMAT) 2.5-2.5 MCG/ACT AERS   albuterol (VENTOLIN HFA) 108 (90 Base) MCG/ACT inhaler   Non-small cell cancer of right lung (HCC)     Other   CIGARETTE SMOKER   Restless leg syndrome   Relevant Medications   gabapentin (NEURONTIN) 300 MG capsule    Return in about 6 months (around 03/24/2023).  We will try gabapentin for restless legs.  Explained that [place] has a long-acting bronchodilator and can get in it.  She should not need the rescue inhaler that often.  Gave her prescription for 1 albuterol inhaler with no refills.  She did receive her flu shot.  Declines seasonal COVID and RSV vaccines.  She is not interested in quitting smoking.  Libby Maw, MD

## 2022-10-02 ENCOUNTER — Other Ambulatory Visit: Payer: Self-pay | Admitting: Family Medicine

## 2022-10-02 DIAGNOSIS — K219 Gastro-esophageal reflux disease without esophagitis: Secondary | ICD-10-CM

## 2022-10-06 NOTE — Telephone Encounter (Signed)
error 

## 2022-11-13 ENCOUNTER — Other Ambulatory Visit: Payer: Self-pay | Admitting: Family Medicine

## 2022-11-13 DIAGNOSIS — M129 Arthropathy, unspecified: Secondary | ICD-10-CM

## 2022-11-13 DIAGNOSIS — Z8612 Personal history of poliomyelitis: Secondary | ICD-10-CM

## 2022-11-13 DIAGNOSIS — M1990 Unspecified osteoarthritis, unspecified site: Secondary | ICD-10-CM

## 2022-12-05 ENCOUNTER — Other Ambulatory Visit: Payer: Self-pay | Admitting: Family Medicine

## 2022-12-05 DIAGNOSIS — J309 Allergic rhinitis, unspecified: Secondary | ICD-10-CM

## 2022-12-20 DIAGNOSIS — H04123 Dry eye syndrome of bilateral lacrimal glands: Secondary | ICD-10-CM | POA: Diagnosis not present

## 2022-12-20 DIAGNOSIS — H2512 Age-related nuclear cataract, left eye: Secondary | ICD-10-CM | POA: Diagnosis not present

## 2022-12-20 DIAGNOSIS — H40013 Open angle with borderline findings, low risk, bilateral: Secondary | ICD-10-CM | POA: Diagnosis not present

## 2022-12-20 DIAGNOSIS — H10413 Chronic giant papillary conjunctivitis, bilateral: Secondary | ICD-10-CM | POA: Diagnosis not present

## 2022-12-20 DIAGNOSIS — Z961 Presence of intraocular lens: Secondary | ICD-10-CM | POA: Diagnosis not present

## 2022-12-20 DIAGNOSIS — H31091 Other chorioretinal scars, right eye: Secondary | ICD-10-CM | POA: Diagnosis not present

## 2023-02-16 ENCOUNTER — Ambulatory Visit (HOSPITAL_COMMUNITY)
Admission: RE | Admit: 2023-02-16 | Discharge: 2023-02-16 | Disposition: A | Discharge status: Home / Self Care | Payer: PPO | Source: Ambulatory Visit | Attending: Radiation Oncology | Admitting: Radiation Oncology

## 2023-02-16 DIAGNOSIS — J432 Centrilobular emphysema: Secondary | ICD-10-CM | POA: Diagnosis not present

## 2023-02-16 DIAGNOSIS — J90 Pleural effusion, not elsewhere classified: Secondary | ICD-10-CM | POA: Diagnosis not present

## 2023-02-16 DIAGNOSIS — C3490 Malignant neoplasm of unspecified part of unspecified bronchus or lung: Secondary | ICD-10-CM | POA: Diagnosis not present

## 2023-02-16 DIAGNOSIS — C3491 Malignant neoplasm of unspecified part of right bronchus or lung: Secondary | ICD-10-CM | POA: Insufficient documentation

## 2023-02-17 NOTE — Progress Notes (Signed)
Radiation Oncology         [phone] ________________________________  Name: Lindsey Rowland MRN: [id number]  Date: 02/20/2023  DOB: 08-04-1951  Follow-Up Visit Note  CC: Mliss Sax, MD  Deeann Saint, MD    ICD-10-CM   1. Non-small cell cancer of right lung [facility]) [C34.91]  C34.91 CT CHEST WO CONTRAST      Diagnosis: Non-Small cell lung cancer, clinical stage I-II    Interval Since Last Radiation: 4 years, 5 months, and 4 days   09/10/2018, 09/12/2018, 09/17/2018: Right lung / 54 Gy in 3 fractions (SBRT)   Narrative:  The patient returns today for routine follow-up and to review recent imaging. She was last seen here for follow-up on 08/18/22.   Her most recent chest CT without contrast performed on 02/16/23 showed: no significant interval change with stable posttreatment changes of the right lung base with pleural thickening, as well as a tiny effusion and fiduciary marker. CT also showed stability of the 5 mm left lower lobe lung nodule (present since at least 2020).     On evaluation today the patient notices some dyspnea with exertion but no big change over the past 6 months.  She denies any significant cough or hemoptysis.  She has some occasional discomfort in the right posterior lower rib cage area from previous fall in this area.                       Allergies:  is allergic to amoxicillin.  Meds: Current Outpatient Medications  Medication Sig Dispense Refill   albuterol (VENTOLIN HFA) 108 (90 Base) MCG/ACT inhaler 1 puff every 8 hours as needed 8 g 0   Aspirin-Salicylamide-Caffeine (BC HEADACHE POWDER PO) Take 1 packet by mouth daily as needed (for pain or headache).      celecoxib (CELEBREX) 200 MG capsule TAKE 1 CAPSULE BY MOUTH EVERY DAY AS NEEDED 90 capsule 1   gabapentin (NEURONTIN) 300 MG capsule Take 1 capsule (300 mg total) by mouth 3 (three) times daily. 90 capsule 3   mometasone (NASONEX) 50 MCG/ACT nasal spray PLACE 2 SPRAYS INTO THE NOSE  DAILY. 51 each 4   Multiple Vitamin (MULITIVITAMIN WITH MINERALS) TABS Take 1 tablet by mouth daily.     pantoprazole (PROTONIX) 40 MG tablet TAKE 1 TABLET BY MOUTH EVERY DAY 90 tablet 1   Polyethyl Glycol-Propyl Glycol (SYSTANE OP) Place 1 drop into both eyes daily as needed (for dry eyes).     Tiotropium Bromide-Olodaterol (STIOLTO RESPIMAT) 2.5-2.5 MCG/ACT AERS INHALE 2 PUFFS BY MOUTH INTO THE LUNGS DAILY 1 each 5   diphenoxylate-atropine (LOMOTIL) 2.5-0.025 MG tablet TAKE 1 TABLET BY MOUTH 4 (FOUR) TIMES DAILY AS NEEDED FOR DIARRHEA OR LOOSE STOOLS. (Patient not taking: Reported on 02/20/2023) 120 tablet 2   megestrol (MEGACE ES) 625 MG/5ML suspension TAKE 1/2 TSP DAILY. (Patient not taking: Reported on 09/22/2022) 225 mL 1   No current facility-administered medications for this encounter.    Physical Findings: The patient is in no acute distress. Patient is alert and oriented.  height is 5\' 4"  (1.626 m) and weight is 88 lb 2 oz (40 kg). Her temporal temperature is 97.5 F (36.4 C) (abnormal). Her blood pressure is 118/67 and her pulse is 82. Her respiration is 18 and oxygen saturation is 98%. .  Lungs are clear to auscultation bilaterally. Heart has regular rate and rhythm. No palpable cervical, supraclavicular, or axillary adenopathy. Abdomen soft, non-tender, normal bowel sounds.  In the right lower posterior rib cage,   She has some thickening along one of the ribs.  This is nontender with palpation and soft consistent with benign tissue.   Lab Findings: Lab Results  Component Value Date   WBC 3.2 (L) 05/09/2022   HGB 14.5 05/09/2022   HCT 42.9 05/09/2022   MCV 102.7 (H) 05/09/2022   PLT 262.0 05/09/2022    Radiographic Findings: CT CHEST WO CONTRAST  Result Date: 02/20/2023 CLINICAL DATA:  Non-small-cell lung cancer. Status post XRT. * Tracking Code: BO * EXAM: CT CHEST WITHOUT CONTRAST TECHNIQUE: Multidetector CT imaging of the chest was performed following the standard  protocol without IV contrast. RADIATION DOSE REDUCTION: This exam was performed according to the departmental dose-optimization program which includes automated exposure control, adjustment of the mA and/or kV according to patient size and/or use of iterative reconstruction technique. COMPARISON:  CT 08/05/2022 and older FINDINGS: Cardiovascular: Trace pericardial fluid. The heart is nonenlarged. Coronary artery calcifications are seen. The thoracic aorta has a normal course and caliber with diffuse vascular calcifications. Mediastinum/Nodes: Slightly patulous thoracic esophagus. Small thyroid gland. No specific abnormal lymph node enlargement present in the axillary regions, hilum. Prominent node precarinal which previously measured up to 8 mm in short axis, today measures 7 mm in short axis on series 2, image 66. Lungs/Pleura: Once again there is slight elevation of the right hemidiaphragm. Tiny right pleural effusion again identified as well. There is a fiduciary marker in the extreme inferior right lower lobe near the margin of the diaphragm with the adjacent thickening. No areas of new consolidation, pneumothorax or effusion. Advanced centrilobular emphysematous changes. Apical pleural thickening. Stable 5 mm left lower lobe lung nodule on series 5, image 87. This nodule has been present since at least August 2020. Upper Abdomen: No acute abnormality. Musculoskeletal: There are areas of degenerative changes along the thoracic spine. Slight curvature as well particularly along the thoracolumbar spinal region. Stable deformities of the posterolateral aspect of the right eighth and ninth and tenth ribs. IMPRESSION: No significant interval change. Stable posttreatment changes of the right lung base with pleural thickening, tiny effusion and fiduciary marker. Associated volume loss. Stable 5 mm left lower lobe lung nodule. This been present since at least 2020. Advanced centrilobular emphysematous changes. Aortic  Atherosclerosis (ICD10-I70.0) and Emphysema (ICD10-J43.9). Electronically Signed   By: Karen Kays M.D.   On: 02/20/2023 10:05    Impression: Non-Small cell lung cancer, clinical stage I-II    No evidence of recurrence on clinical exam today.  Recent chest CT scan favorable.  Plan: Routine follow-up in 6 months.  Prior to this follow-up appointment patient will have a repeat CT scan of the chest.   22 minutes of total time was spent for this patient encounter, including preparation, face-to-face counseling with the patient and coordination of care, physical exam, and documentation of the encounter. ____________________________________  Billie Lade, PhD, MD  This document serves as a record of services personally performed by Antony Blackbird, MD. It was created on his behalf by Neena Rhymes, a trained medical scribe. The creation of this record is based on the scribe's personal observations and the provider's statements to them. This document has been checked and approved by the attending provider.

## 2023-02-20 ENCOUNTER — Encounter: Payer: Self-pay | Admitting: Radiation Oncology

## 2023-02-20 ENCOUNTER — Ambulatory Visit
Admission: RE | Admit: 2023-02-20 | Discharge: 2023-02-20 | Disposition: A | Discharge status: Home / Self Care | Payer: PPO | Source: Ambulatory Visit | Attending: Radiation Oncology | Admitting: Radiation Oncology

## 2023-02-20 VITALS — BP 118/67 | HR 82 | Temp 97.5°F | Resp 18 | Ht 64.0 in | Wt 88.1 lb

## 2023-02-20 DIAGNOSIS — C3432 Malignant neoplasm of lower lobe, left bronchus or lung: Secondary | ICD-10-CM | POA: Diagnosis not present

## 2023-02-20 DIAGNOSIS — Z85118 Personal history of other malignant neoplasm of bronchus and lung: Secondary | ICD-10-CM | POA: Diagnosis not present

## 2023-02-20 DIAGNOSIS — Z923 Personal history of irradiation: Secondary | ICD-10-CM | POA: Insufficient documentation

## 2023-02-20 DIAGNOSIS — Z79899 Other long term (current) drug therapy: Secondary | ICD-10-CM | POA: Diagnosis not present

## 2023-02-20 DIAGNOSIS — C3491 Malignant neoplasm of unspecified part of right bronchus or lung: Secondary | ICD-10-CM

## 2023-02-20 DIAGNOSIS — J432 Centrilobular emphysema: Secondary | ICD-10-CM | POA: Insufficient documentation

## 2023-02-20 NOTE — Progress Notes (Signed)
Lindsey Rowland is here today for follow up post radiation to the lung.  Lung Side: Right, patient completed treatment on 09/17/18.   Does the patient complain of any of the following: Pain: Patient reports having a hardened area to right back that causes some discomfort.  States pain comes and goes with certain movements.  Shortness of breath w/wo exertion: Yes, on exertion.  Cough: Yes Hemoptysis:  No Pain with swallowing: No Swallowing/choking concerns: No Appetite: [employer] Level: low Post radiation skin Changes: No    Additional comments if applicable:  BP [id number] (BP Location: Left Arm, Patient Position: Sitting)   Pulse 82   Temp (!) 97.5 F (36.4 C) (Temporal)   Resp 18   Ht 5\' 4"  (1.626 m)   Wt 88 lb 2 oz (40 kg)   SpO2 98%   BMI 15.13 kg/m

## 2023-03-24 DIAGNOSIS — L508 Other urticaria: Secondary | ICD-10-CM | POA: Diagnosis not present

## 2023-03-24 DIAGNOSIS — R21 Rash and other nonspecific skin eruption: Secondary | ICD-10-CM | POA: Diagnosis not present

## 2023-03-24 DIAGNOSIS — L259 Unspecified contact dermatitis, unspecified cause: Secondary | ICD-10-CM | POA: Diagnosis not present

## 2023-03-30 ENCOUNTER — Telehealth: Payer: Self-pay | Admitting: Family Medicine

## 2023-03-30 NOTE — Telephone Encounter (Signed)
Patient dropped off document Handicap Placard, to be filled out by provider. Patient requested to send it via Call Patient to pick up within 7-days. Document is located in providers tray at front office.Please advise at Mobile [phone] (mobile)

## 2023-03-30 NOTE — Telephone Encounter (Signed)
CLINICAL USE BELOW THIS LINE (use X to signify taken)  __X__Form received and placed in providers office for signature. ____Form completed and faxed to LOA Dept. ____Form completed & LVM to notify pt ready for pick up ____Charge sheet & copy of form in front office folder for office supervisor.

## 2023-04-16 ENCOUNTER — Other Ambulatory Visit: Payer: Self-pay | Admitting: Family Medicine

## 2023-04-16 DIAGNOSIS — K219 Gastro-esophageal reflux disease without esophagitis: Secondary | ICD-10-CM

## 2023-04-18 ENCOUNTER — Telehealth: Payer: Self-pay | Admitting: *Deleted

## 2023-04-18 NOTE — Telephone Encounter (Signed)
CALLED PATIENT TO INFORM OF CT FOR 08-23-23- ARRIVAL TIME- 11:15 AM @ [facility], NO RESTRICTIONS TO TEST, PATIENT TO RECEIVE RESULTS ON 08-24-23 @ 11 AM FROM DR. KINARD, SPOKE WITH PATIENT AND SHE IS AWARE OF THESE APPTS. AND THE INSTRUCTIONS

## 2023-04-20 NOTE — Telephone Encounter (Signed)
Patient called on status of paperwork. Please advise

## 2023-04-20 NOTE — Telephone Encounter (Signed)
Form up front ready for pick up patient aware and will come to pick up.

## 2023-05-04 ENCOUNTER — Telehealth: Payer: Self-pay | Admitting: Family Medicine

## 2023-05-04 NOTE — Telephone Encounter (Signed)
Returned call spoke with patient and her daughter both states that patient had felt as if she was over heated while cooking and sat on the floor for about 15-20 minutes then she crawled to the recliner and sat there for a while. Patient daughter advised me that she patient reported to her she had sat on the floor for about 40 minutes max not 4 hours and this happened about 2-3 weeks ago have happened where patient had to sit down and cool off for a while in the past. Patients daughter would like patient to come in for evaluation for memory issues and have her checked. Appointment scheduled.

## 2023-05-04 NOTE — Telephone Encounter (Signed)
Luzia daughter Therisa Doyne [phone]    Pts daughter called came from [facility], Pt got hot while cooking a few days ago passed out and laid on the floor for 4 hours. She finally got help. This is not the first time.The daughter said her face is drooping on one side. She refused for the daughter to take her to ED. She was sent to [facility]

## 2023-05-04 NOTE — Telephone Encounter (Signed)
Pt was sent to [facility] they called back and said to make her an appt. Because she refused ED I have asked Dr Doreene Burke what he advises.  The pt has spoken to me and has given permission to speak to her daughter Lindsey Rowland about her care. Her Number is [phone]. She will sign a DPR when she comes in.

## 2023-05-09 ENCOUNTER — Ambulatory Visit (INDEPENDENT_AMBULATORY_CARE_PROVIDER_SITE_OTHER): Payer: HMO | Admitting: Family Medicine

## 2023-05-09 ENCOUNTER — Encounter: Payer: Self-pay | Admitting: Family Medicine

## 2023-05-09 VITALS — BP 134/82 | HR 83 | Temp 98.0°F | Ht 64.0 in | Wt 85.2 lb

## 2023-05-09 DIAGNOSIS — F17200 Nicotine dependence, unspecified, uncomplicated: Secondary | ICD-10-CM

## 2023-05-09 DIAGNOSIS — G2581 Restless legs syndrome: Secondary | ICD-10-CM

## 2023-05-09 DIAGNOSIS — C3491 Malignant neoplasm of unspecified part of right bronchus or lung: Secondary | ICD-10-CM | POA: Diagnosis not present

## 2023-05-09 DIAGNOSIS — L989 Disorder of the skin and subcutaneous tissue, unspecified: Secondary | ICD-10-CM | POA: Diagnosis not present

## 2023-05-09 DIAGNOSIS — J449 Chronic obstructive pulmonary disease, unspecified: Secondary | ICD-10-CM | POA: Diagnosis not present

## 2023-05-09 DIAGNOSIS — Z0000 Encounter for general adult medical examination without abnormal findings: Secondary | ICD-10-CM

## 2023-05-09 DIAGNOSIS — R7989 Other specified abnormal findings of blood chemistry: Secondary | ICD-10-CM | POA: Diagnosis not present

## 2023-05-09 MED ORDER — PREGABALIN 25 MG PO CAPS: 25.0000 mg | ORAL_CAPSULE | Freq: Every evening | ORAL | 2 refills | Status: DC

## 2023-05-09 NOTE — Progress Notes (Signed)
Established Patient Office Visit   Subjective:  Patient ID: Lindsey CLOWER, female    DOB: Apr 25, 1951  Age: 72 y.o. MRN: [id number]  Chief Complaint  Patient presents with   Medical Management of Chronic Issues    Routine follow up patients daughter would like to discuss patient passing out a few weeks ago. No recent symptoms.     HPI Encounter Diagnoses  Name Primary?   Healthcare maintenance Yes   Skin lesion of face    CIGARETTE SMOKER    Restless leg syndrome    COPD, mild (HCC)    Non-small cell cancer of right lung (HCC)    Low TSH level    Presents for follow-up accompanied by her daughter Fritzi Mandes who currently lives in [place].  Daughter is concerned about an episode where patient has been cooking in the kitchen, felt lightheaded and then sat down on the floor for several minutes.  Patient denied any associated palpitations shortness of breath, spinning sensation, diaphoresis, nausea or vomiting.  Lesion of left upper lip that is not resolving.  Patient does have a tendency to pick at it.  History of non-small cell cancer of right lung.    She continues to smoke a pack per week.  Continues to have issues with restless leg and nighttime muscle cramps in her legs.  Has not tolerated Elavil.  Gabapentin has not been helpful.  She lives with her husband who was recently admitted to hospice for end-of-life care.   Review of Systems  Constitutional: Negative.   HENT: Negative.    Eyes:  Negative for blurred vision, discharge and redness.  Respiratory: Negative.    Cardiovascular: Negative.   Gastrointestinal:  Negative for abdominal pain.  Genitourinary: Negative.   Musculoskeletal: Negative.  Negative for myalgias.  Skin:  Negative for rash.  Neurological:  Negative for tingling, loss of consciousness and weakness.  Endo/Heme/Allergies:  Negative for polydipsia.     Current Outpatient Medications:    albuterol (VENTOLIN HFA) 108 (90 Base) MCG/ACT inhaler, 1 puff  every 8 hours as needed, Disp: 8 g, Rfl: 0   Aspirin-Salicylamide-Caffeine (BC HEADACHE POWDER PO), Take 1 packet by mouth daily as needed (for pain or headache). , Disp: , Rfl:    celecoxib (CELEBREX) 200 MG capsule, TAKE 1 CAPSULE BY MOUTH EVERY DAY AS NEEDED, Disp: 90 capsule, Rfl: 1   mometasone (NASONEX) 50 MCG/ACT nasal spray, PLACE 2 SPRAYS INTO THE NOSE DAILY., Disp: 51 each, Rfl: 4   Multiple Vitamin (MULITIVITAMIN WITH MINERALS) TABS, Take 1 tablet by mouth daily., Disp: , Rfl:    pantoprazole (PROTONIX) 40 MG tablet, TAKE 1 TABLET BY MOUTH EVERY DAY, Disp: 90 tablet, Rfl: 1   Polyethyl Glycol-Propyl Glycol (SYSTANE OP), Place 1 drop into both eyes daily as needed (for dry eyes)., Disp: , Rfl:    pregabalin (LYRICA) 25 MG capsule, Take 1 capsule (25 mg total) by mouth at bedtime., Disp: 30 capsule, Rfl: 2   Tiotropium Bromide-Olodaterol (STIOLTO RESPIMAT) 2.5-2.5 MCG/ACT AERS, INHALE 2 PUFFS BY MOUTH INTO THE LUNGS DAILY, Disp: 1 each, Rfl: 5   cyclobenzaprine (FLEXERIL) 10 MG tablet, Take 10 mg by mouth 3 (three) times daily as needed. (Patient not taking: Reported on 05/09/2023), Disp: , Rfl:    diphenoxylate-atropine (LOMOTIL) 2.5-0.025 MG tablet, TAKE 1 TABLET BY MOUTH 4 (FOUR) TIMES DAILY AS NEEDED FOR DIARRHEA OR LOOSE STOOLS. (Patient not taking: Reported on 02/20/2023), Disp: 120 tablet, Rfl: 2   megestrol (MEGACE ES) 625 MG/5ML  suspension, TAKE 1/2 TSP DAILY. (Patient not taking: Reported on 09/22/2022), Disp: 225 mL, Rfl: 1   Objective:     BP 134/82 (BP Location: Right Arm, Patient Position: Sitting, Cuff Size: Small)   Pulse 83   Temp 98 F (36.7 C) (Temporal)   Ht 5\' 4"  (1.626 m)   Wt 85 lb 3.2 oz (38.6 kg)   SpO2 94%   BMI 14.62 kg/m  BP Readings from Last 3 Encounters:  05/09/23 134/82  02/20/23 118/67  09/22/22 130/80   Wt Readings from Last 3 Encounters:  05/09/23 85 lb 3.2 oz (38.6 kg)  02/20/23 88 lb 2 oz (40 kg)  09/22/22 83 lb 3.2 oz (37.7 kg)       Physical Exam Constitutional:      General: She is not in acute distress.    Appearance: Normal appearance. She is not ill-appearing, toxic-appearing or diaphoretic.  HENT:     Head: Normocephalic and atraumatic.     Right Ear: External ear normal.     Left Ear: External ear normal.     Mouth/Throat:     Mouth: Mucous membranes are moist.     Pharynx: Oropharynx is clear. No oropharyngeal exudate or posterior oropharyngeal erythema.  Eyes:     General: No scleral icterus.       Right eye: No discharge.        Left eye: No discharge.     Extraocular Movements: Extraocular movements intact.     Conjunctiva/sclera: Conjunctivae normal.     Pupils: Pupils are equal, round, and reactive to light.  Cardiovascular:     Rate and Rhythm: Normal rate and regular rhythm.     Heart sounds: Heart sounds are distant.  Pulmonary:     Effort: Pulmonary effort is normal. No respiratory distress.     Breath sounds: Decreased air movement present. Decreased breath sounds present.  Abdominal:     General: Bowel sounds are normal.     Tenderness: There is no abdominal tenderness. There is no guarding.  Musculoskeletal:     Cervical back: No rigidity or tenderness.  Skin:    General: Skin is warm and dry.  Neurological:     Mental Status: She is alert and oriented to person, place, and time.  Psychiatric:        Mood and Affect: Mood normal.        Behavior: Behavior normal.      No results found for any visits on 05/09/23.    The 10-year ASCVD risk score (Arnett DK, et al., 2019) is: 18%    Assessment & Plan:   Healthcare maintenance -     CBC -     Comprehensive metabolic panel -     Urinalysis, Routine w reflex microscopic  Skin lesion of face -     Ambulatory referral to Dermatology  CIGARETTE SMOKER  Restless leg syndrome -     Pregabalin; Take 1 capsule (25 mg total) by mouth at bedtime.  Dispense: 30 capsule; Refill: 2 -     Magnesium  COPD, mild (HCC)  Non-small  cell cancer of right lung (HCC)  Low TSH level -     TSH -     T3, free    Return in about 3 months (around 08/09/2023).  Encouraged hydration.  Encouraged nutritional supplementation with Ensure.  Mliss Sax, MD

## 2023-05-15 ENCOUNTER — Other Ambulatory Visit (INDEPENDENT_AMBULATORY_CARE_PROVIDER_SITE_OTHER): Payer: HMO

## 2023-05-15 DIAGNOSIS — R7989 Other specified abnormal findings of blood chemistry: Secondary | ICD-10-CM | POA: Diagnosis not present

## 2023-05-15 DIAGNOSIS — R8290 Unspecified abnormal findings in urine: Secondary | ICD-10-CM

## 2023-05-15 DIAGNOSIS — Z0000 Encounter for general adult medical examination without abnormal findings: Secondary | ICD-10-CM | POA: Diagnosis not present

## 2023-05-15 DIAGNOSIS — G2581 Restless legs syndrome: Secondary | ICD-10-CM | POA: Diagnosis not present

## 2023-05-15 LAB — COMPREHENSIVE METABOLIC PANEL
ALT: 13 U/L (ref 0–35)
AST: 26 U/L (ref 0–37)
Albumin: 4.5 g/dL (ref 3.5–5.2)
Alkaline Phosphatase: 84 U/L (ref 39–117)
BUN: 17 mg/dL (ref 6–23)
CO2: 22 mEq/L (ref 19–32)
Calcium: 8.8 mg/dL (ref 8.4–10.5)
Chloride: 106 mEq/L (ref 96–112)
Creatinine, Ser: 0.75 mg/dL (ref 0.40–1.20)
GFR: 79.31 mL/min (ref >60.00)
Glucose, Bld: 78 mg/dL (ref 70–99)
Potassium: 4 mEq/L (ref 3.5–5.1)
Sodium: 142 mEq/L (ref 135–145)
Total Bilirubin: 0.3 mg/dL (ref 0.2–1.2)
Total Protein: 6.7 g/dL (ref 6.0–8.3)

## 2023-05-15 LAB — URINALYSIS, ROUTINE W REFLEX MICROSCOPIC
Bilirubin Urine: NEGATIVE
Hgb urine dipstick: NEGATIVE
Ketones, ur: 15 — AB
Nitrite: POSITIVE — AB
RBC / HPF: NONE SEEN (ref 0–?)
Specific Gravity, Urine: 1.025 (ref 1.000–1.030)
Total Protein, Urine: NEGATIVE
Urine Glucose: NEGATIVE
Urobilinogen, UA: 0.2 (ref 0.0–1.0)
pH: 5.5 (ref 5.0–8.0)

## 2023-05-15 LAB — CBC
HCT: 44.9 % (ref 36.0–46.0)
Hemoglobin: 14.7 g/dL (ref 12.0–15.0)
MCHC: 32.7 g/dL (ref 30.0–36.0)
MCV: 104.3 fl — ABNORMAL HIGH (ref 78.0–100.0)
Platelets: 249 10*3/uL (ref 150.0–400.0)
RBC: 4.31 Mil/uL (ref 3.87–5.11)
RDW: 14.8 % (ref 11.5–15.5)
WBC: 4.9 10*3/uL (ref 4.0–10.5)

## 2023-05-15 LAB — T3, FREE: T3, Free: 2 pg/mL — ABNORMAL LOW (ref 2.3–4.2)

## 2023-05-15 LAB — TSH: TSH: 0.46 u[IU]/mL (ref 0.35–5.50)

## 2023-05-15 LAB — MAGNESIUM: Magnesium: 2.1 mg/dL (ref 1.5–2.5)

## 2023-05-15 NOTE — Addendum Note (Signed)
Addended by: Andrez Grime on: 05/15/2023 04:56 PM   Modules accepted: Orders

## 2023-05-16 ENCOUNTER — Telehealth: Payer: Self-pay | Admitting: Family Medicine

## 2023-05-16 NOTE — Telephone Encounter (Signed)
Spoke with patients daughter who has concerns about possible dementia issues becoming worse not eating drinking.

## 2023-05-16 NOTE — Telephone Encounter (Signed)
Pt's daughter, Eddie Candle is needing a cb concerning her moms most recent lab results.  She would also like to discuss her moms dementia getting worse and what steps they should start to take with her. Eddie Candle at   [phone] [facility])

## 2023-05-18 ENCOUNTER — Other Ambulatory Visit: Payer: HMO

## 2023-05-18 ENCOUNTER — Other Ambulatory Visit (INDEPENDENT_AMBULATORY_CARE_PROVIDER_SITE_OTHER): Payer: HMO

## 2023-05-18 DIAGNOSIS — R8290 Unspecified abnormal findings in urine: Secondary | ICD-10-CM

## 2023-05-18 LAB — URINALYSIS, ROUTINE W REFLEX MICROSCOPIC
Bilirubin Urine: NEGATIVE
Hgb urine dipstick: NEGATIVE
Ketones, ur: NEGATIVE
Nitrite: NEGATIVE
Specific Gravity, Urine: <=1.005 — AB (ref 1.000–1.030)
Total Protein, Urine: NEGATIVE
Urine Glucose: NEGATIVE
Urobilinogen, UA: 0.2 (ref 0.0–1.0)
pH: 6 (ref 5.0–8.0)

## 2023-05-20 LAB — URINE CULTURE
MICRO NUMBER:: 15079232
SPECIMEN QUALITY:: ADEQUATE

## 2023-05-22 MED ORDER — SULFAMETHOXAZOLE-TRIMETHOPRIM 800-160 MG PO TABS
1.0000 | ORAL_TABLET | Freq: Two times a day (BID) | ORAL | 0 refills | Status: AC
Start: 2023-05-22 — End: 2023-05-29

## 2023-05-22 NOTE — Addendum Note (Signed)
Addended by: Andrez Grime on: 05/22/2023 05:01 PM   Modules accepted: Orders

## 2023-05-23 ENCOUNTER — Telehealth (INDEPENDENT_AMBULATORY_CARE_PROVIDER_SITE_OTHER): Payer: HMO | Admitting: Family Medicine

## 2023-05-23 ENCOUNTER — Encounter: Payer: Self-pay | Admitting: Family Medicine

## 2023-05-23 VITALS — Ht 64.0 in

## 2023-05-23 DIAGNOSIS — F1090 Alcohol use, unspecified, uncomplicated: Secondary | ICD-10-CM | POA: Insufficient documentation

## 2023-05-23 DIAGNOSIS — Z789 Other specified health status: Secondary | ICD-10-CM | POA: Diagnosis not present

## 2023-05-23 DIAGNOSIS — R4189 Other symptoms and signs involving cognitive functions and awareness: Secondary | ICD-10-CM | POA: Diagnosis not present

## 2023-05-23 NOTE — Progress Notes (Signed)
Established Patient Office Visit   Subjective:  Patient ID: Lindsey Rowland, female    DOB: 1951-04-17  Age: 72 y.o. MRN: [id number]  Chief Complaint  Patient presents with   Advice Only    Concerns about possible dementia seems to be confused at times.     HPI Encounter Diagnoses  Name Primary?   Cognitive decline Yes   Alcohol use   Zoom meeting today with patient's daughter, [name], who lives in [place].  Daughter came in with patient at last visit but was reluctant to discuss today's issues in front of her mom.  Daughter is concerned about patient's cognitive decline.  Patient will call her in the evening and they will have a conversation.  Mom will then call back a little later a little later wanting to have the same conversation.  This is also happened with patient's sister.  Patient regularly drives to the grocery store for Grits.  She already has a large number of grafts in her kitchen cupboards.  Patient has been drinking regularly for years.  She has been the mother and the primary provider for her family.  Patient's husband has been disabled for many years.  Patient has a pool in her backyard.  Her other daughter lives next door.  This daughter has 3 young children all less than 6.  She will send them over to patient's house regularly to be watched.  Kirsten along with her sister have healthcare power of attorney.    Review of Systems  Constitutional: Negative.   HENT: Negative.    Eyes:  Negative for blurred vision, discharge and redness.  Respiratory: Negative.    Cardiovascular: Negative.   Gastrointestinal:  Negative for abdominal pain.  Genitourinary: Negative.   Musculoskeletal: Negative.  Negative for myalgias.  Skin:  Negative for rash.  Neurological:  Negative for tingling, loss of consciousness and weakness.  Endo/Heme/Allergies:  Negative for polydipsia.     Current Outpatient Medications:    albuterol (VENTOLIN HFA) 108 (90 Base) MCG/ACT inhaler, 1  puff every 8 hours as needed, Disp: 8 g, Rfl: 0   Aspirin-Salicylamide-Caffeine (BC HEADACHE POWDER PO), Take 1 packet by mouth daily as needed (for pain or headache). , Disp: , Rfl:    celecoxib (CELEBREX) 200 MG capsule, TAKE 1 CAPSULE BY MOUTH EVERY DAY AS NEEDED, Disp: 90 capsule, Rfl: 1   mometasone (NASONEX) 50 MCG/ACT nasal spray, PLACE 2 SPRAYS INTO THE NOSE DAILY., Disp: 51 each, Rfl: 4   Multiple Vitamin (MULITIVITAMIN WITH MINERALS) TABS, Take 1 tablet by mouth daily., Disp: , Rfl:    pantoprazole (PROTONIX) 40 MG tablet, TAKE 1 TABLET BY MOUTH EVERY DAY, Disp: 90 tablet, Rfl: 1   Polyethyl Glycol-Propyl Glycol (SYSTANE OP), Place 1 drop into both eyes daily as needed (for dry eyes)., Disp: , Rfl:    pregabalin (LYRICA) 25 MG capsule, Take 1 capsule (25 mg total) by mouth at bedtime., Disp: 30 capsule, Rfl: 2   sulfamethoxazole-trimethoprim (BACTRIM DS) 800-160 MG tablet, Take 1 tablet by mouth 2 (two) times daily for 7 days., Disp: 14 tablet, Rfl: 0   Tiotropium Bromide-Olodaterol (STIOLTO RESPIMAT) 2.5-2.5 MCG/ACT AERS, INHALE 2 PUFFS BY MOUTH INTO THE LUNGS DAILY, Disp: 1 each, Rfl: 5   cyclobenzaprine (FLEXERIL) 10 MG tablet, Take 10 mg by mouth 3 (three) times daily as needed. (Patient not taking: Reported on 05/09/2023), Disp: , Rfl:    diphenoxylate-atropine (LOMOTIL) 2.5-0.025 MG tablet, TAKE 1 TABLET BY MOUTH 4 (FOUR) TIMES DAILY  AS NEEDED FOR DIARRHEA OR LOOSE STOOLS. (Patient not taking: Reported on 02/20/2023), Disp: 120 tablet, Rfl: 2   megestrol (MEGACE ES) 625 MG/5ML suspension, TAKE 1/2 TSP DAILY. (Patient not taking: Reported on 09/22/2022), Disp: 225 mL, Rfl: 1   Objective:     Ht 5\' 4"  (1.626 m)   BMI 14.62 kg/m    Physical Exam  No physical exam today.  Patient not present No results found for any visits on 05/23/23.    The 10-year ASCVD risk score (Arnett DK, et al., 2019) is: 18%    Assessment & Plan:   Cognitive decline -     Ambulatory referral to  Neurology -     AMB Referral to Chronic Care Management Services  Alcohol use -     AMB Referral to Chronic Care Management Services    Return Has scheduled appointment.Mliss Sax, MD  Virtual Visit via Video Note  I connected with Regina Eck on 05/23/23 at  2:20 PM EDT by a video enabled telemedicine application and verified that I am speaking with the correct person using two identifiers.  Location: Patient: not present . Conference with patient's daughter.  Provider: work   I discussed the limitations of evaluation and management by telemedicine and the availability of in person appointments. The patient expressed understanding and agreed to proceed.  History of Present Illness:    Observations/Objective:   Assessment and Plan:   Follow Up Instructions:    I discussed the assessment and treatment plan with the patient. The patient was provided an opportunity to ask questions and all were answered. The patient agreed with the plan and demonstrated an understanding of the instructions.   The patient was advised to call back or seek an in-person evaluation if the symptoms worsen or if the condition fails to improve as anticipated.  I provided 25 minutes of non-face-to-face time during this encounter.   Mliss Sax, MD

## 2023-05-31 ENCOUNTER — Telehealth: Payer: Self-pay

## 2023-05-31 NOTE — Progress Notes (Signed)
  Care Coordination  Note  05/31/2023 Name: Lindsey Rowland MRN: [id number] DOB: 03-20-1951  Lindsey Rowland is a 72 y.o. year old female who is a primary care patient of Mliss Sax, MD. I reached out to Regina Eck by phone today to offer care coordination services.      [name] was given information about Care Coordination services today including:  The Care Coordination services include support from the care team which includes your Nurse Coordinator, Clinical Social Worker, or Pharmacist.  The Care Coordination team is here to help remove barriers to the health concerns and goals most important to you. Care Coordination services are voluntary and the patient may decline or stop services at any time by request to their care team member.   Patient did not agree to participate in care coordination services at this time.  Follow up plan: Patient declines further follow up or participation in care coordination services.   Penne Lash, RMA Care Guide [facility]  [place], [place] [address] Direct Dial: [phone] [name]

## 2023-06-02 ENCOUNTER — Ambulatory Visit (INDEPENDENT_AMBULATORY_CARE_PROVIDER_SITE_OTHER): Payer: HMO | Admitting: Family Medicine

## 2023-06-02 ENCOUNTER — Encounter: Payer: Self-pay | Admitting: Family Medicine

## 2023-06-02 VITALS — BP 110/64 | HR 92 | Temp 97.9°F | Ht 64.0 in | Wt 81.4 lb

## 2023-06-02 DIAGNOSIS — R8290 Unspecified abnormal findings in urine: Secondary | ICD-10-CM | POA: Diagnosis not present

## 2023-06-02 DIAGNOSIS — R4189 Other symptoms and signs involving cognitive functions and awareness: Secondary | ICD-10-CM

## 2023-06-02 DIAGNOSIS — J449 Chronic obstructive pulmonary disease, unspecified: Secondary | ICD-10-CM

## 2023-06-02 LAB — URINALYSIS, ROUTINE W REFLEX MICROSCOPIC: Protein: NEGATIVE

## 2023-06-02 NOTE — Progress Notes (Signed)
And I think this is just a recheck of the urine  Established Patient Office Visit   Subjective:  Patient ID: Lindsey Rowland, female    DOB: [birth year]  Age: 72 y.o. MRN: [id number]  Chief Complaint  Patient presents with   Follow-up    Follow Up UA. Pt daughter states no one has contacted.     HPI Encounter Diagnoses  Name Primary?   COPD, mild (HCC) Yes   Abnormal urine    Cognitive decline    For follow-up of urine.  Here with daughter Lindsey Rowland.  Patient does not feel as though she is experiencing memory problems.  According to her, she continues to drive and never gets lost.  She only drives locally.  She denies forgetting recent conversations.  Please see previous note.  Continues follow-up with oncology for non-small cell carcinoma.  She has not seen pulmonology in some time.   Review of Systems  Constitutional: Negative.   HENT: Negative.    Eyes:  Negative for blurred vision, discharge and redness.  Respiratory: Negative.    Cardiovascular: Negative.   Gastrointestinal:  Negative for abdominal pain.  Genitourinary: Negative.   Musculoskeletal: Negative.  Negative for myalgias.  Skin:  Negative for rash.  Neurological:  Negative for tingling, loss of consciousness and weakness.  Endo/Heme/Allergies:  Negative for polydipsia.     Current Outpatient Medications:    albuterol (VENTOLIN HFA) 108 (90 Base) MCG/ACT inhaler, 1 puff every 8 hours as needed, Disp: 8 g, Rfl: 0   Aspirin-Salicylamide-Caffeine (BC HEADACHE POWDER PO), Take 1 packet by mouth daily as needed (for pain or headache). , Disp: , Rfl:    celecoxib (CELEBREX) 200 MG capsule, TAKE 1 CAPSULE BY MOUTH EVERY DAY AS NEEDED, Disp: 90 capsule, Rfl: 1   cyclobenzaprine (FLEXERIL) 10 MG tablet, Take 10 mg by mouth 3 (three) times daily as needed. (Patient not taking: Reported on 05/09/2023), Disp: , Rfl:    diphenoxylate-atropine (LOMOTIL) 2.5-0.025 MG tablet, TAKE 1 TABLET BY MOUTH 4 (FOUR) TIMES DAILY AS NEEDED  FOR DIARRHEA OR LOOSE STOOLS. (Patient not taking: Reported on 02/20/2023), Disp: 120 tablet, Rfl: 2   megestrol (MEGACE ES) 625 MG/5ML suspension, TAKE 1/2 TSP DAILY. (Patient not taking: Reported on 09/22/2022), Disp: 225 mL, Rfl: 1   mometasone (NASONEX) 50 MCG/ACT nasal spray, PLACE 2 SPRAYS INTO THE NOSE DAILY., Disp: 51 each, Rfl: 4   Multiple Vitamin (MULITIVITAMIN WITH MINERALS) TABS, Take 1 tablet by mouth daily., Disp: , Rfl:    pantoprazole (PROTONIX) 40 MG tablet, TAKE 1 TABLET BY MOUTH EVERY DAY, Disp: 90 tablet, Rfl: 1   Polyethyl Glycol-Propyl Glycol (SYSTANE OP), Place 1 drop into both eyes daily as needed (for dry eyes)., Disp: , Rfl:    pregabalin (LYRICA) 25 MG capsule, Take 1 capsule (25 mg total) by mouth at bedtime., Disp: 30 capsule, Rfl: 2   Tiotropium Bromide-Olodaterol (STIOLTO RESPIMAT) 2.5-2.5 MCG/ACT AERS, INHALE 2 PUFFS BY MOUTH INTO THE LUNGS DAILY, Disp: 1 each, Rfl: 5   Objective:     BP 110/64   Pulse 92   Temp 97.9 F (36.6 C)   Ht 5\' 4"  (1.626 m)   Wt 81 lb 6.4 oz (36.9 kg)   SpO2 94%   BMI 13.97 kg/m  Wt Readings from Last 3 Encounters:  06/02/23 81 lb 6.4 oz (36.9 kg)  05/09/23 85 lb 3.2 oz (38.6 kg)  02/20/23 88 lb 2 oz (40 kg)      Physical Exam Constitutional:  General: She is not in acute distress.    Appearance: Normal appearance. She is not ill-appearing, toxic-appearing or diaphoretic.  HENT:     Head: Normocephalic and atraumatic.     Right Ear: External ear normal.     Left Ear: External ear normal.  Eyes:     General: No scleral icterus.       Right eye: No discharge.        Left eye: No discharge.     Extraocular Movements: Extraocular movements intact.     Conjunctiva/sclera: Conjunctivae normal.  Pulmonary:     Effort: Pulmonary effort is normal. No respiratory distress.  Skin:    General: Skin is warm and dry.  Neurological:     Mental Status: She is alert and oriented to person, place, and time.  Psychiatric:         Mood and Affect: Mood normal.        Behavior: Behavior normal.      No results found for any visits on 06/02/23.    The 10-year ASCVD risk score (Arnett DK, et al., 2019) is: 12.6%    Assessment & Plan:   COPD, mild (HCC) -     Ambulatory referral to Pulmonology  Abnormal urine -     Urinalysis, Routine w reflex microscopic -     Urine Culture  Cognitive decline    Return in about 8 weeks (around 07/28/2023), or if symptoms worsen or fail to improve.  Patient agrees to see the neurologist for evaluation of cognitive decline.  Rechecking urinalysis and urine culture.  Mliss Sax, MD

## 2023-06-06 ENCOUNTER — Telehealth: Payer: Self-pay | Admitting: Family Medicine

## 2023-06-06 LAB — URINALYSIS, ROUTINE W REFLEX MICROSCOPIC
Hyaline Cast: NONE SEEN /LPF
Nitrite: NEGATIVE
Specific Gravity, Urine: 1.007 (ref 1.001–1.035)
pH: 5.5 (ref 5.0–8.0)

## 2023-06-06 LAB — URINE CULTURE

## 2023-06-06 NOTE — Telephone Encounter (Signed)
Pt's daughter, Lindsey Rowland is wanting a cb at [phone] to discuss her mom getting care from [facility]. [facility] was suppose to have faxed over orders for her to get help at home.

## 2023-06-07 ENCOUNTER — Other Ambulatory Visit: Payer: Self-pay

## 2023-06-07 DIAGNOSIS — R4189 Other symptoms and signs involving cognitive functions and awareness: Secondary | ICD-10-CM

## 2023-06-08 LAB — URINE CULTURE
MICRO NUMBER:: 15147241
SPECIMEN QUALITY:: ADEQUATE

## 2023-06-08 LAB — MICROSCOPIC MESSAGE

## 2023-06-08 LAB — URINALYSIS, ROUTINE W REFLEX MICROSCOPIC
Bacteria, UA: NONE SEEN /HPF
Bilirubin Urine: NEGATIVE
Glucose, UA: NEGATIVE
Protein, ur: NEGATIVE
RBC / HPF: NONE SEEN /HPF (ref 0–2)

## 2023-06-09 DIAGNOSIS — E039 Hypothyroidism, unspecified: Secondary | ICD-10-CM

## 2023-06-09 DIAGNOSIS — E441 Mild protein-calorie malnutrition: Secondary | ICD-10-CM

## 2023-06-09 DIAGNOSIS — C3490 Malignant neoplasm of unspecified part of unspecified bronchus or lung: Secondary | ICD-10-CM | POA: Insufficient documentation

## 2023-06-09 HISTORY — DX: Malignant neoplasm of unspecified part of unspecified bronchus or lung: C34.90

## 2023-06-09 HISTORY — DX: Mild protein-calorie malnutrition: E44.1

## 2023-06-09 HISTORY — DX: Hypothyroidism, unspecified: E03.9

## 2023-07-05 ENCOUNTER — Encounter (INDEPENDENT_AMBULATORY_CARE_PROVIDER_SITE_OTHER): Payer: Self-pay

## 2023-07-24 ENCOUNTER — Telehealth: Payer: Self-pay | Admitting: Family Medicine

## 2023-07-24 NOTE — Telephone Encounter (Signed)
She wants the call to make the appt. [place] and has to plan to come and take the pt to the appt.

## 2023-07-24 NOTE — Telephone Encounter (Signed)
Caller Name: Viann Fish Daughter on DPR Call back phone #: [phone]  Reason for Call: Pts daughter called upset with [place] for not making the referral for Neurology I assured her the referral had been made and 2 calls were made to schedule and was told they would call back. Daughter stated that her mom has early dementia and was not even aware of these calls. She is requesting all calls go to her at the number above. .She acknowledged she understood and will be waiting for a call.

## 2023-07-25 NOTE — Telephone Encounter (Signed)
Thanks

## 2023-07-25 NOTE — Telephone Encounter (Signed)
I called daughter, Left detailed message that her # was updated as prime # for her mother. I also left the # to [facility] for her to call and schedule. I also left my # for her to call me back and confirm she got my message and if she has any further questions.

## 2023-08-02 ENCOUNTER — Encounter: Payer: Self-pay | Admitting: Family Medicine

## 2023-08-02 ENCOUNTER — Ambulatory Visit (INDEPENDENT_AMBULATORY_CARE_PROVIDER_SITE_OTHER): Admitting: Family Medicine

## 2023-08-02 VITALS — BP 118/66 | HR 74 | Temp 98.0°F | Ht 64.0 in | Wt 81.4 lb

## 2023-08-02 DIAGNOSIS — R4189 Other symptoms and signs involving cognitive functions and awareness: Secondary | ICD-10-CM | POA: Diagnosis not present

## 2023-08-02 DIAGNOSIS — J449 Chronic obstructive pulmonary disease, unspecified: Secondary | ICD-10-CM

## 2023-08-02 DIAGNOSIS — F17200 Nicotine dependence, unspecified, uncomplicated: Secondary | ICD-10-CM

## 2023-08-02 DIAGNOSIS — G2581 Restless legs syndrome: Secondary | ICD-10-CM | POA: Diagnosis not present

## 2023-08-02 NOTE — Progress Notes (Signed)
Established Patient Office Visit   Subjective:  Patient ID: Lindsey Rowland, female    DOB: 1951-09-19  Age: 72 y.o. MRN: [id number]  Chief Complaint  Patient presents with   Dysuria    8 week follow up. Pt states dysuria has resolved.     Dysuria    Encounter Diagnoses  Name Primary?   COPD, mild (HCC) Yes   CIGARETTE SMOKER    Restless leg syndrome    Cognitive decline    For follow-up of the above.  Dysuria has resolved.  She has been keeping herself hydrated.  Husband is not doing well.  Hospice is coming daily.  There is a family meeting at her home plan for this weekend.  She has not heard about an appointment with neurology.  She has been trying to wean herself off tobacco and is down to 2 cigarettes daily.  Pregabalin has helped with the restless leg syndrome.  Continues to live at home with her husband.  Her 11 year old granddaughter also lives there and helps around the house and out in the yard. Stiolto respimat continues to help breathing.    Review of Systems  Constitutional: Negative.   HENT: Negative.    Eyes:  Negative for blurred vision, discharge and redness.  Respiratory: Negative.    Cardiovascular: Negative.   Gastrointestinal:  Negative for abdominal pain.  Genitourinary:  Positive for dysuria.  Musculoskeletal: Negative.  Negative for myalgias.  Skin:  Negative for rash.  Neurological:  Negative for tingling, loss of consciousness and weakness.  Endo/Heme/Allergies:  Negative for polydipsia.     Current Outpatient Medications:    albuterol (VENTOLIN HFA) 108 (90 Base) MCG/ACT inhaler, 1 puff every 8 hours as needed, Disp: 8 g, Rfl: 0   Aspirin-Salicylamide-Caffeine (BC HEADACHE POWDER PO), Take 1 packet by mouth daily as needed (for pain or headache). , Disp: , Rfl:    celecoxib (CELEBREX) 200 MG capsule, TAKE 1 CAPSULE BY MOUTH EVERY DAY AS NEEDED, Disp: 90 capsule, Rfl: 1   cyclobenzaprine (FLEXERIL) 10 MG tablet, Take 10 mg by mouth 3 (three)  times daily as needed., Disp: , Rfl:    diphenoxylate-atropine (LOMOTIL) 2.5-0.025 MG tablet, TAKE 1 TABLET BY MOUTH 4 (FOUR) TIMES DAILY AS NEEDED FOR DIARRHEA OR LOOSE STOOLS., Disp: 120 tablet, Rfl: 2   megestrol (MEGACE ES) 625 MG/5ML suspension, TAKE 1/2 TSP DAILY., Disp: 225 mL, Rfl: 1   mometasone (NASONEX) 50 MCG/ACT nasal spray, PLACE 2 SPRAYS INTO THE NOSE DAILY., Disp: 51 each, Rfl: 4   Multiple Vitamin (MULITIVITAMIN WITH MINERALS) TABS, Take 1 tablet by mouth daily., Disp: , Rfl:    pantoprazole (PROTONIX) 40 MG tablet, TAKE 1 TABLET BY MOUTH EVERY DAY, Disp: 90 tablet, Rfl: 1   Polyethyl Glycol-Propyl Glycol (SYSTANE OP), Place 1 drop into both eyes daily as needed (for dry eyes)., Disp: , Rfl:    pregabalin (LYRICA) 25 MG capsule, Take 1 capsule (25 mg total) by mouth at bedtime., Disp: 30 capsule, Rfl: 2   Tiotropium Bromide-Olodaterol (STIOLTO RESPIMAT) 2.5-2.5 MCG/ACT AERS, INHALE 2 PUFFS BY MOUTH INTO THE LUNGS DAILY, Disp: 1 each, Rfl: 5   Objective:     BP 118/66   Pulse 74   Temp 98 F (36.7 C)   Ht 5\' 4"  (1.626 m)   Wt 81 lb 6.4 oz (36.9 kg)   SpO2 96%   BMI 13.97 kg/m    Physical Exam Constitutional:      General: She is not in acute  distress.    Appearance: Normal appearance. She is not ill-appearing, toxic-appearing or diaphoretic.  HENT:     Head: Normocephalic and atraumatic.     Right Ear: External ear normal.     Left Ear: External ear normal.     Mouth/Throat:     Mouth: Mucous membranes are moist.     Pharynx: Oropharynx is clear. No oropharyngeal exudate or posterior oropharyngeal erythema.  Eyes:     General: No scleral icterus.       Right eye: No discharge.        Left eye: No discharge.     Extraocular Movements: Extraocular movements intact.     Conjunctiva/sclera: Conjunctivae normal.     Pupils: Pupils are equal, round, and reactive to light.  Cardiovascular:     Rate and Rhythm: Normal rate and regular rhythm.     Heart sounds:  Heart sounds are distant.  Pulmonary:     Effort: Pulmonary effort is normal. No respiratory distress.     Breath sounds: Decreased air movement present. Decreased breath sounds present.  Abdominal:     General: Bowel sounds are normal.     Tenderness: There is no abdominal tenderness. There is no guarding.  Musculoskeletal:     Cervical back: No rigidity or tenderness.  Skin:    General: Skin is warm and dry.  Neurological:     Mental Status: She is alert and oriented to person, place, and time.  Psychiatric:        Mood and Affect: Mood normal.        Behavior: Behavior normal.      No results found for any visits on 08/02/23.    The 10-year ASCVD risk score (Arnett DK, et al., 2019) is: 15.9%    Assessment & Plan:   COPD, mild (HCC)  CIGARETTE SMOKER  Restless leg syndrome  Cognitive decline    Return in about 3 months (around 11/02/2023).  Awaiting appointment with neurology.  Encouraged continue weaning off of tobacco.  Information given on managing the challenges of tobacco cessation.  Mliss Sax, MD

## 2023-08-23 ENCOUNTER — Ambulatory Visit (HOSPITAL_COMMUNITY)
Admission: RE | Admit: 2023-08-23 | Discharge: 2023-08-23 | Disposition: A | Discharge status: Home / Self Care | Payer: Medicare Other | Source: Ambulatory Visit | Attending: Radiation Oncology | Admitting: Radiation Oncology

## 2023-08-23 ENCOUNTER — Encounter: Payer: Self-pay | Admitting: Physician Assistant

## 2023-08-23 DIAGNOSIS — C3491 Malignant neoplasm of unspecified part of right bronchus or lung: Secondary | ICD-10-CM | POA: Insufficient documentation

## 2023-08-23 DIAGNOSIS — C3490 Malignant neoplasm of unspecified part of unspecified bronchus or lung: Secondary | ICD-10-CM | POA: Diagnosis not present

## 2023-08-23 DIAGNOSIS — J439 Emphysema, unspecified: Secondary | ICD-10-CM | POA: Diagnosis not present

## 2023-08-23 DIAGNOSIS — I700 Atherosclerosis of aorta: Secondary | ICD-10-CM | POA: Diagnosis not present

## 2023-08-23 NOTE — Progress Notes (Signed)
Radiation Oncology         [phone] ________________________________  Name: Lindsey Rowland MRN: [id number]  Date: 08/24/2023  DOB: 1951-02-27  Follow-Up Visit Note  CC: Mliss Sax, MD  Deeann Saint, MD  No diagnosis found.  Diagnosis: Non-Small cell lung cancer, clinical stage I-II    Interval Since Last Radiation:  4 years, 11 months, and 10 days  09/10/2018, 09/12/2018, 09/17/2018: Right lung / 54 Gy in 3 fractions (SBRT)   Narrative:  The patient returns today for routine follow-up and to review recent imaging. She was last seen here for follow-up on 02/20/23.  Her most recent chest CT performed yesterday demonstrates: ***.   No other significant oncologic interval history since the patient was last seen here for follow-up.   ***                               Allergies:  is allergic to amoxicillin.  Meds: Current Outpatient Medications  Medication Sig Dispense Refill   albuterol (VENTOLIN HFA) 108 (90 Base) MCG/ACT inhaler 1 puff every 8 hours as needed 8 g 0   Aspirin-Salicylamide-Caffeine (BC HEADACHE POWDER PO) Take 1 packet by mouth daily as needed (for pain or headache).      celecoxib (CELEBREX) 200 MG capsule TAKE 1 CAPSULE BY MOUTH EVERY DAY AS NEEDED 90 capsule 1   cyclobenzaprine (FLEXERIL) 10 MG tablet Take 10 mg by mouth 3 (three) times daily as needed.     diphenoxylate-atropine (LOMOTIL) 2.5-0.025 MG tablet TAKE 1 TABLET BY MOUTH 4 (FOUR) TIMES DAILY AS NEEDED FOR DIARRHEA OR LOOSE STOOLS. 120 tablet 2   megestrol (MEGACE ES) 625 MG/5ML suspension TAKE 1/2 TSP DAILY. 225 mL 1   mometasone (NASONEX) 50 MCG/ACT nasal spray PLACE 2 SPRAYS INTO THE NOSE DAILY. 51 each 4   Multiple Vitamin (MULITIVITAMIN WITH MINERALS) TABS Take 1 tablet by mouth daily.     pantoprazole (PROTONIX) 40 MG tablet TAKE 1 TABLET BY MOUTH EVERY DAY 90 tablet 1   Polyethyl Glycol-Propyl Glycol (SYSTANE OP) Place 1 drop into both eyes daily as needed (for dry eyes).      pregabalin (LYRICA) 25 MG capsule Take 1 capsule (25 mg total) by mouth at bedtime. 30 capsule 2   Tiotropium Bromide-Olodaterol (STIOLTO RESPIMAT) 2.5-2.5 MCG/ACT AERS INHALE 2 PUFFS BY MOUTH INTO THE LUNGS DAILY 1 each 5   No current facility-administered medications for this encounter.    Physical Findings: The patient is in no acute distress. Patient is alert and oriented.  vitals were not taken for this visit. .  No significant changes. Lungs are clear to auscultation bilaterally. Heart has regular rate and rhythm. No palpable cervical, supraclavicular, or axillary adenopathy. Abdomen soft, non-tender, normal bowel sounds.   Lab Findings: Lab Results  Component Value Date   WBC 4.9 05/15/2023   HGB 14.7 05/15/2023   HCT 44.9 05/15/2023   MCV 104.3 (H) 05/15/2023   PLT 249.0 05/15/2023    Radiographic Findings: No results found.  Impression: Non-Small cell lung cancer, clinical stage I-II    The patient is recovering from the effects of radiation.  ***  Plan:  ***   *** minutes of total time was spent for this patient encounter, including preparation, face-to-face counseling with the patient and coordination of care, physical exam, and documentation of the encounter. ____________________________________  Billie Lade, PhD, MD  This document serves as a record of  services personally performed by Antony Blackbird, MD. It was created on his behalf by Neena Rhymes, a trained medical scribe. The creation of this record is based on the scribe's personal observations and the provider's statements to them. This document has been checked and approved by the attending provider.

## 2023-08-24 ENCOUNTER — Encounter: Payer: Self-pay | Admitting: Radiation Oncology

## 2023-08-24 ENCOUNTER — Ambulatory Visit
Admission: RE | Admit: 2023-08-24 | Discharge: 2023-08-24 | Disposition: A | Discharge status: Home / Self Care | Payer: Medicare Other | Source: Ambulatory Visit | Attending: Radiation Oncology | Admitting: Radiation Oncology

## 2023-08-24 VITALS — BP 133/64 | HR 73 | Temp 97.7°F | Resp 18 | Ht 64.0 in | Wt 81.5 lb

## 2023-08-24 DIAGNOSIS — C3491 Malignant neoplasm of unspecified part of right bronchus or lung: Secondary | ICD-10-CM

## 2023-08-24 DIAGNOSIS — I700 Atherosclerosis of aorta: Secondary | ICD-10-CM | POA: Diagnosis not present

## 2023-08-24 DIAGNOSIS — Z79899 Other long term (current) drug therapy: Secondary | ICD-10-CM | POA: Insufficient documentation

## 2023-08-24 DIAGNOSIS — M8580 Other specified disorders of bone density and structure, unspecified site: Secondary | ICD-10-CM | POA: Diagnosis not present

## 2023-08-24 DIAGNOSIS — I2510 Atherosclerotic heart disease of native coronary artery without angina pectoris: Secondary | ICD-10-CM | POA: Diagnosis not present

## 2023-08-24 DIAGNOSIS — J439 Emphysema, unspecified: Secondary | ICD-10-CM | POA: Insufficient documentation

## 2023-08-24 DIAGNOSIS — Z7951 Long term (current) use of inhaled steroids: Secondary | ICD-10-CM | POA: Diagnosis not present

## 2023-08-24 DIAGNOSIS — Z85118 Personal history of other malignant neoplasm of bronchus and lung: Secondary | ICD-10-CM | POA: Insufficient documentation

## 2023-08-24 DIAGNOSIS — Z923 Personal history of irradiation: Secondary | ICD-10-CM | POA: Diagnosis not present

## 2023-08-24 DIAGNOSIS — C3432 Malignant neoplasm of lower lobe, left bronchus or lung: Secondary | ICD-10-CM | POA: Diagnosis not present

## 2023-08-24 DIAGNOSIS — Z791 Long term (current) use of non-steroidal anti-inflammatories (NSAID): Secondary | ICD-10-CM | POA: Diagnosis not present

## 2023-08-24 NOTE — Progress Notes (Addendum)
PEPPER GRAMLING is here today for follow up post radiation to the lung.  Lung Side: rt lung 09/10/18 to 09/17/18  Does the patient complain of any of the following: Pain: Denies any pain Shortness of breath w/wo exertion:  Sometime with activity Cough: Denies Hemoptysis: Denies Pain with swallowing:  Denies Swallowing/choking concerns:  Denies Appetite: Good Energy Level: Denies any issues Post radiation skin Changes:  Denies any issues with skin Vitals:   08/24/23 1119  BP: 133/64  Pulse: 73  Resp: 18  Temp: 97.7 F (36.5 C)  TempSrc: Temporal  SpO2: 97%  Weight: 37 kg  Height: 5\' 4"  (1.626 m)       Additional comments if applicable:

## 2023-08-29 ENCOUNTER — Other Ambulatory Visit: Payer: Self-pay | Admitting: Family Medicine

## 2023-08-29 DIAGNOSIS — G2581 Restless legs syndrome: Secondary | ICD-10-CM

## 2023-09-19 ENCOUNTER — Ambulatory Visit (INDEPENDENT_AMBULATORY_CARE_PROVIDER_SITE_OTHER): Payer: HMO | Admitting: Pulmonary Disease

## 2023-09-19 ENCOUNTER — Encounter: Payer: Self-pay | Admitting: Pulmonary Disease

## 2023-09-19 DIAGNOSIS — J449 Chronic obstructive pulmonary disease, unspecified: Secondary | ICD-10-CM

## 2023-09-19 DIAGNOSIS — F17210 Nicotine dependence, cigarettes, uncomplicated: Secondary | ICD-10-CM

## 2023-09-19 MED ORDER — ALBUTEROL SULFATE HFA 108 (90 BASE) MCG/ACT IN AERS: 2.0000 | INHALATION_SPRAY | Freq: Four times a day (QID) | RESPIRATORY_TRACT | 6 refills | Status: DC | PRN

## 2023-09-19 MED ORDER — ALBUTEROL SULFATE HFA 108 (90 BASE) MCG/ACT IN AERS: INHALATION_SPRAY | RESPIRATORY_TRACT | 0 refills | Status: DC

## 2023-09-19 NOTE — Progress Notes (Signed)
Assessment/Plan:     Lindsey Rowland is a very pleasant 72 y.o. year old RH female with a history of NSCLCa s/p XRT R lung, COPD, Tobacco abuse, ETOH habituation, prior asbestos exposure, possible depression per daughter's report, arthritis, polio as a child with R >L leg weakness seen today for evaluation of memory loss. MoCA today is 15/30.  Workup is in progress.    Memory Impairment  MRI brain without contrast to assess for underlying structural abnormality and assess vascular load  Neurocognitive testing to further evaluate cognitive concerns and determine other underlying cause of memory changes, including potential contribution from sleep, anxiety, attention, or depression among others  Start Donepezil  5 mg daily. Side effects discussed. Will likely increase dose next visit   Check B12, B1, iron studies,  Recommend good control of cardiovascular risk factors.   Continue to control mood as per PCP Recommend tobacco and alcohol cessation, counseled. Recommend psychotherapy for possible depression  Folllow up in 3 months   Subjective:    The patient is accompanied by her daughter who lives in [place] who supplements the history.    How long did patient have memory difficulties?  For about 6 months.  Patient reports some difficulty remembering new information, recent conversations, names. She has also been going to the supermarket to get the same products, forgetting she has already purchased them. LTM is good.  repeats oneself?  Endorsed. Disoriented when walking into a room?  Patient denies   Leaving objects in unusual places?  Denies.   Wandering behavior? denies   Any personality changes, or depression, anxiety? Gets upset easily, worried that hey are attacking her because of her memory , worse if she has not been drinking.   Hallucinations or paranoia? Denies.  Seizures? Denies.    Any sleep changes?  Sleeps well . Denies vivid dreams. She has RLS , takes Lyrica with good  response. sleepwalking   Sleep apnea? Denies.   Any hygiene concerns?  Denies.   Independent of bathing and dressing? Endorsed.  Does the patient need help with medications? Patient  is in charge   Who is in charge of the finances?  Patient is in charge     Any changes in appetite? Decreased  Patient have trouble swallowing?  Denies.   Does the patient cook? No.  Any headaches?  Denies.   Chronic pain? Denies.   Ambulates with difficulty? Denies, walks often.   Recent falls or head injuries? Denies.     Vision changes?  Denies any new issues.  Has a history of detached retina. S/p lens surgery.  Any strokelike symptoms? Denies.   Any tremors? Denies.   Any anosmia? Denies.   Any incontinence of urine? Denies.   Any bowel dysfunction? Denies. If diarrhea she takes lomotil      Patient lives with husband who is disabled. Other daughter lives next door   History of heavy alcohol intake? 2-3 large beers a day.   History of heavy tobacco use? Smokes about 2 cigs a day now, smoked more in the recent past (1 y ago) Family history of dementia?  Mother with dementia AD  Does patient drive? Yes, denies getting lost.  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 HHN as well PT   Labs 05/2023: TSH 0.46, T3 2.0  Allergies  Allergen Reactions   Amoxicillin Diarrhea and Itching    Current Outpatient Medications  Medication Instructions  albuterol (VENTOLIN HFA) 108 (90 Base) MCG/ACT inhaler 1 puff every 8 hours as needed   albuterol (VENTOLIN HFA) 108 (90 Base) MCG/ACT inhaler 2 puffs, Inhalation, Every 6 hours PRN   Aspirin-Salicylamide-Caffeine (BC HEADACHE POWDER PO) 1 packet, Oral, Daily PRN   celecoxib (CELEBREX) 200 MG capsule TAKE 1 CAPSULE BY MOUTH EVERY DAY AS NEEDED   cyclobenzaprine (FLEXERIL) 10 mg, 3 times daily PRN   diphenoxylate-atropine (LOMOTIL) 2.5-0.025 MG tablet 1 tablet, Oral, 4 times daily PRN   donepezil  (ARICEPT) 5 mg, Oral, Daily   megestrol (MEGACE ES) 625 MG/5ML suspension TAKE 1/2 TSP DAILY.   mometasone (NASONEX) 50 MCG/ACT nasal spray 2 sprays, Nasal, Daily   Multiple Vitamin (MULITIVITAMIN WITH MINERALS) TABS 1 tablet, Daily   pantoprazole (PROTONIX) 40 MG tablet TAKE 1 TABLET BY MOUTH EVERY DAY   Polyethyl Glycol-Propyl Glycol (SYSTANE OP) 1 drop, Both Eyes, Daily PRN   pregabalin (LYRICA) 25 mg, Oral, Daily at bedtime   Tiotropium Bromide-Olodaterol (STIOLTO RESPIMAT) 2.5-2.5 MCG/ACT AERS INHALE 2 PUFFS BY MOUTH INTO THE LUNGS DAILY     VITALS:   Vitals:   09/20/23 0940  BP: 128/71  Pulse: 69  Resp: 20  SpO2: 95%  Weight: 82 lb (37.2 kg)  Height: 5\' 2"  (1.575 m)      PHYSICAL EXAM   HEENT:  Normocephalic, atraumatic.  The superficial temporal arteries are without ropiness or tenderness. Cardiovascular: Regular rate and rhythm. Lungs: Clear to auscultation bilaterally. Neck: There are no carotid bruits noted bilaterally. General : Cachectic appearing   NEUROLOGICAL:    09/20/2023   10:00 AM  Montreal Cognitive Assessment   Visuospatial/ Executive (0/5) 4  Naming (0/3) 3  Attention: Read list of digits (0/2) 2  Attention: Read list of letters (0/1) 1  Attention: Serial 7 subtraction starting at 100 (0/3) 0  Language: Repeat phrase (0/2) 0  Language : Fluency (0/1) 1  Abstraction (0/2) 0  Delayed Recall (0/5) 0  Orientation (0/6) 4  Total 15  Adjusted Score (based on education) 15        No data to display           Orientation:  Alert and oriented to person, place and not to time. No aphasia or dysarthria. Fund of knowledge is appropriate. Recent and remote memory impaired.  Attention and concentration are reduced.  Able to name objects and repeat phrases. Delayed recall 0/5 Cranial nerves: There is good facial symmetry. Extraocular muscles are intact and visual fields are full to confrontational testing. Speech is fluent and clear. No tongue  deviation. Hearing is intact to conversational tone.  Tone: Tone is good throughout. Sensation: Sensation is intact to light touch.  Vibration is intact at the bilateral big toe.  Coordination: The patient has no difficulty with RAM's or FNF bilaterally. Normal finger to nose  Motor: Strength is 5/5 in the bilateral upper and lower extremities. There is no pronator drift. There are no fasciculations noted. DTR's: Deep tendon reflexes are 2/4 bilaterally. Gait and Station: The patient is able to ambulate without difficulty. Gait is cautious and narrow. Stride length is normal. She has chronic limp due to prior Polio         Thank you for allowing [place] the opportunity to participate in the care of this nice patient. Please do not hesitate to contact us for any questions or concerns.   Total time spent on today's visit was 60 minutes dedicated to this patient today, preparing to see patient, examining  the patient, ordering tests and/or medications and counseling the patient, documenting clinical information in the EHR or other health record, independently interpreting results and communicating results to the patient/family, discussing treatment and goals, answering patient's questions and coordinating care.  Cc:  Mliss Sax, MD  Marlowe Kays 09/20/2023 3:45 PM

## 2023-09-19 NOTE — Progress Notes (Signed)
Lindsey Rowland    [id number]    01-03-1951  Primary Care Physician:Kremer, Talmadge Coventry, MD  Referring Physician: Mliss Sax, MD [address] [place],  [place] [address]  Chief complaint:   Follow up for COPD  HPI: 72 y.o. who  has a past medical history of Arthritis, Cancer (HCC), COPD (chronic obstructive pulmonary disease) (HCC), Headache(784.0), History of radiation therapy (09/10/2018-09/17/2018), Osteopenia, Polio, and Ulcer.   Discussed the use of AI scribe software for clinical note transcription with the patient, who gave verbal consent to proceed.  The patient, with a history of COPD and lung cancer, presents for a follow-up appointment after gap of 3 years after missing previous appointments due to confusion and memory loss. The patient is also experiencing weight loss and decreased appetite, and is receiving palliative care. The patient is on Stiolto for COPD and continues to smoke, albeit less than before. The patient also has a history of asbestos exposure from her previous job at a [employer]. The patient's home was recently treated for mold in 2023, which was suspected to be affecting the patient's cognitive and respiratory health.   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  Continues to smoke a few cigarettes every day Travel history: No significant travel history Relevant family history: No family history of lung disease  Outpatient Encounter Medications as of 09/19/2023  Medication Sig   albuterol (VENTOLIN HFA) 108 (90 Base) MCG/ACT inhaler 1 puff every 8 hours as needed   Aspirin-Salicylamide-Caffeine (BC HEADACHE POWDER PO) Take 1 packet by mouth daily as needed (for pain or headache).    celecoxib (CELEBREX) 200 MG capsule TAKE 1 CAPSULE BY MOUTH EVERY DAY AS NEEDED    cyclobenzaprine (FLEXERIL) 10 MG tablet Take 10 mg by mouth 3 (three) times daily as needed.   diphenoxylate-atropine (LOMOTIL) 2.5-0.025 MG tablet TAKE 1 TABLET BY MOUTH 4 (FOUR) TIMES DAILY AS NEEDED FOR DIARRHEA OR LOOSE STOOLS.   mometasone (NASONEX) 50 MCG/ACT nasal spray PLACE 2 SPRAYS INTO THE NOSE DAILY.   Multiple Vitamin (MULITIVITAMIN WITH MINERALS) TABS Take 1 tablet by mouth daily.   pantoprazole (PROTONIX) 40 MG tablet TAKE 1 TABLET BY MOUTH EVERY DAY   Polyethyl Glycol-Propyl Glycol (SYSTANE OP) Place 1 drop into both eyes daily as needed (for dry eyes).   pregabalin (LYRICA) 25 MG capsule TAKE 1 CAPSULE BY MOUTH AT BEDTIME.   Tiotropium Bromide-Olodaterol (STIOLTO RESPIMAT) 2.5-2.5 MCG/ACT AERS INHALE 2 PUFFS BY MOUTH INTO THE LUNGS DAILY   megestrol (MEGACE ES) 625 MG/5ML suspension TAKE 1/2 TSP DAILY. (Patient not taking: Reported on 09/19/2023)   No facility-administered encounter medications on file as of 09/19/2023.    Allergies as of 09/19/2023 - Review Complete 09/19/2023  Allergen Reaction Noted   Amoxicillin Diarrhea and Itching 01/20/2017    Past Medical History:  Diagnosis Date   Arthritis    Cancer [facility])    "pre-cervical" cells  - age 8   COPD (chronic obstructive pulmonary disease) (HCC)    Headache(784.0)    History of radiation therapy 09/10/2018-09/17/2018   SBRT 3 fx to right lung; Dr. Antony Blackbird   Osteopenia    Polio    as a child   Ulcer    stomach hx    Past Surgical History:  Procedure Laterality Date   APPENDECTOMY  BREAST BIOPSY Right 2013   CATARACT EXTRACTION     right   DILATION AND CURETTAGE OF UTERUS     FOOT SURGERY Bilateral    hip si joint     injection/ablation   RETINAL DETACHMENT SURGERY  12/2010   right eye   TONSILLECTOMY     VIDEO BRONCHOSCOPY WITH ENDOBRONCHIAL NAVIGATION Right 08/01/2018   Procedure: VIDEO BRONCHOSCOPY WITH ENDOBRONCHIAL NAVIGATION Fiducial Placement;  Surgeon: Leslye Peer, MD;  Location:  [facility] OR;  Service: Thoracic;  Laterality: Right;    Family History  Problem Relation Age of Onset   Hiatal hernia Father    Colon cancer Neg Hx    Esophageal cancer Neg Hx    Stomach cancer Neg Hx    Rectal cancer Neg Hx     Social History   Socioeconomic History   Marital status: Married    Spouse name: Not on file   Number of children: Not on file   Years of education: Not on file   Highest education level: Not on file  Occupational History   Not on file  Tobacco Use   Smoking status: Every Day    Current packs/day: 0.25    Average packs/day: 0.3 packs/day for 40.0 years (10.0 ttl pk-yrs)    Types: Cigarettes   Smokeless tobacco: Never   Tobacco comments:    currently smoking 5-6  cigs per day  Vaping Use   Vaping status: Never Used  Substance and Sexual Activity   Alcohol use: Yes    Alcohol/week: 3.0 standard drinks of alcohol    Types: 3 Cans of beer per week   Drug use: No   Sexual activity: Never  Other Topics Concern   Not on file  Social History Narrative   Not on file   Social Determinants of Health   Financial Resource Strain: Low Risk  (09/21/2022)   Overall Financial Resource Strain (CARDIA)    Difficulty of Paying Living Expenses: Not hard at all  Food Insecurity: No Food Insecurity (09/21/2022)   Hunger Vital Sign    Worried About Running Out of Food in the Last Year: Never true    Ran Out of Food in the Last Year: Never true  Transportation Needs: No Transportation Needs (09/21/2022)   PRAPARE - Administrator, Civil Service (Medical): No    Lack of Transportation (Non-Medical): No  Physical Activity: Insufficiently Active (09/21/2022)   Exercise Vital Sign    Days of Exercise per Week: 3 days    Minutes of Exercise per Session: 30 min  Stress: No Stress Concern Present (09/21/2022)   [employer] of Occupational Health - Occupational Stress Questionnaire    Feeling of Stress : Not at all  Social Connections: Moderately  Isolated (09/21/2022)   Social Connection and Isolation Panel [NHANES]    Frequency of Communication with Friends and Family: More than three times a week    Frequency of Social Gatherings with Friends and Family: More than three times a week    Attends Religious Services: Never    Database administrator or Organizations: No    Attends Banker Meetings: Never    Marital Status: Married  Catering manager Violence: Not At Risk (09/21/2022)   Humiliation, Afraid, Rape, and Kick questionnaire    Fear of Current or Ex-Partner: No    Emotionally Abused: No    Physically Abused: No    Sexually Abused: No    Review of systems: Review of Systems  Constitutional: Negative for fever and chills.  HENT: Negative.   Eyes: Negative for blurred vision.  Respiratory: as per HPI  Cardiovascular: Negative for chest pain and palpitations.  Gastrointestinal: Negative for vomiting, diarrhea, blood per rectum. Genitourinary: Negative for dysuria, urgency, frequency and hematuria.  Musculoskeletal: Negative for myalgias, back pain and joint pain.  Skin: Negative for itching and rash.  Neurological: Negative for dizziness, tremors, focal weakness, seizures and loss of consciousness.  Endo/Heme/Allergies: Negative for environmental allergies.  Psychiatric/Behavioral: Negative for depression, suicidal ideas and hallucinations.  All other systems reviewed and are negative.  Physical Exam: Blood pressure (!) 106/58, pulse 82, temperature 98.3 F (36.8 C), temperature source Oral, height 5\' 2"  (1.575 m), weight 83 lb 9.6 oz (37.9 kg), SpO2 94%. Gen:      No acute distress HEENT:  EOMI, sclera anicteric Neck:     No masses; no thyromegaly Lungs:    Clear to auscultation bilaterally; normal respiratory effort CV:         Regular rate and rhythm; no murmurs Abd:      + bowel sounds; soft, non-tender; no palpable masses, no distension Ext:    No edema; adequate peripheral perfusion Skin:       Warm and dry; no rash Neuro: alert and oriented x 3 Psych: normal mood and affect  Data Reviewed: Imaging: CT chest 08/23/2023 Postradiation changes in the right lung base.  Stable 5 mm nodule in the left lung, extensive emphysema I had reviewed the images personally  PFTs: 03/20/2018 FVC 3.15 [102%], FEV1 1.78 [76%], F/F57, TLC 7.14 [141%], DLCO 9.52 [39%] Moderate obstruction with air trapping hyperinflation, severe diffusion impairment  Labs:  Assessment and Plan Non-small cell lung cancer, Diagnosed in 2019 Stable with three nodules that have shrunk significantly. Continues to be monitored by oncologist, Dr. Roselind Messier. -Continue current management and follow-up with oncologist.  Chronic Obstructive Pulmonary Disease (COPD) Stable on Stiolto for approximately 1.5 years. No changes in management needed at this time. -Continue Stiolto inhaler. -Order refill for Albuterol inhaler as needed. -Schedule lung function test in 6 months to assess any changes since last test in 2021.  Tobacco Use Long history of smoking, with some recent reduction in use. Previous attempts to quit, including use of Chantix and hypnosis, were unsuccessful.  Time spent counseling-5 minutes.  Reassess at return visit. -Encourage continued efforts to quit smoking.  Exposure to Asbestos and Mold History of occupational exposure to asbestos and recent exposure to mold at home. No evidence of related lung disease on recent CT scan. -Continue current management and monitor for any new symptoms.  Follow-up Reestablish care and schedule follow-up visit in 6 months. Plan to perform lung function test at that time.   Recommendations: Continue inhalers PFTs and follow-up in 6 months  Chilton Greathouse MD [facility] 09/19/2023, 1:58 PM  CC: Mliss Sax,*

## 2023-09-19 NOTE — Patient Instructions (Signed)
VISIT SUMMARY:  During your recent visit, we discussed your ongoing health conditions, including your lung cancer, COPD, and history of smoking and exposure to asbestos and mold. We also noted your recent weight loss and decreased appetite. Your lung cancer appears stable with the nodules shrinking significantly, and your COPD is also stable. We will continue to monitor these conditions closely.  YOUR PLAN:  -NON-SMALL CELL LUNG CANCER: Your lung cancer is stable and the nodules have shrunk significantly. You will continue to be monitored by your oncologist, Dr. Meta Hatchet.  -CHRONIC OBSTRUCTIVE PULMONARY DISEASE (COPD): Your COPD is stable and you will continue to use your Stiolto inhaler. We will also order a refill for your Albuterol inhaler and schedule a lung function test in 6 months to assess any changes.  -TOBACCO USE: You have a long history of smoking, and while you have reduced your use recently, we encourage you to continue your efforts to quit smoking.  -EXPOSURE TO ASBESTOS AND MOLD: You have a history of exposure to asbestos and mold. There is no evidence of related lung disease on your recent CT scan, but we will continue to monitor for any new symptoms.  INSTRUCTIONS:  Please continue with your current treatments and medications. We will schedule a follow-up visit in 6 months, at which time we will also perform a lung function test. In the meantime, if you have any new symptoms or concerns, please contact the office.

## 2023-09-20 ENCOUNTER — Other Ambulatory Visit (INDEPENDENT_AMBULATORY_CARE_PROVIDER_SITE_OTHER): Payer: HMO

## 2023-09-20 ENCOUNTER — Encounter: Payer: Self-pay | Admitting: Physician Assistant

## 2023-09-20 ENCOUNTER — Other Ambulatory Visit: Payer: Self-pay | Admitting: Physician Assistant

## 2023-09-20 ENCOUNTER — Ambulatory Visit (INDEPENDENT_AMBULATORY_CARE_PROVIDER_SITE_OTHER): Payer: HMO | Admitting: Physician Assistant

## 2023-09-20 ENCOUNTER — Ambulatory Visit: Payer: HMO

## 2023-09-20 VITALS — BP 128/71 | HR 69 | Resp 20 | Ht 62.0 in | Wt 82.0 lb

## 2023-09-20 DIAGNOSIS — R413 Other amnesia: Secondary | ICD-10-CM

## 2023-09-20 LAB — CBC WITH DIFFERENTIAL/PLATELET
Basophils Absolute: 0 10*3/uL (ref 0.0–0.1)
Basophils Relative: 1.3 % (ref 0.0–3.0)
Eosinophils Absolute: 0.1 10*3/uL (ref 0.0–0.7)
Eosinophils Relative: 1.5 % (ref 0.0–5.0)
HCT: 43.6 % (ref 36.0–46.0)
Hemoglobin: 14.3 g/dL (ref 12.0–15.0)
Lymphocytes Relative: 27.8 % (ref 12.0–46.0)
Lymphs Abs: 1 10*3/uL (ref 0.7–4.0)
MCHC: 32.7 g/dL (ref 30.0–36.0)
MCV: 106.5 fL — ABNORMAL HIGH (ref 78.0–100.0)
Monocytes Absolute: 0.3 10*3/uL (ref 0.1–1.0)
Monocytes Relative: 7 % (ref 3.0–12.0)
Neutro Abs: 2.3 10*3/uL (ref 1.4–7.7)
Neutrophils Relative %: 62.4 % (ref 43.0–77.0)
Platelets: 251 10*3/uL (ref 150.0–400.0)
RBC: 4.1 Mil/uL (ref 3.87–5.11)
RDW: 14.9 % (ref 11.5–15.5)
WBC: 3.6 10*3/uL — ABNORMAL LOW (ref 4.0–10.5)

## 2023-09-20 LAB — FOLATE: Folate: >24.2 ng/mL (ref >5.9)

## 2023-09-20 LAB — VITAMIN B12: Vitamin B-12: 328 pg/mL (ref 211–911)

## 2023-09-20 LAB — FERRITIN: Ferritin: 13.1 ng/mL (ref 10.0–291.0)

## 2023-09-20 MED ORDER — DONEPEZIL HCL 5 MG PO TABS
5.0000 mg | ORAL_TABLET | Freq: Every day | ORAL | 11 refills | Status: DC
Start: 2023-09-20 — End: 2024-04-24

## 2023-09-20 NOTE — Patient Instructions (Addendum)
It was a pleasure to see you today at our office.   Recommendations:  Neurocognitive evaluation at our office   MRI of the brain, the radiology office will call you to arrange you appointment  [phone] at [facility] Check labs today [address]  Follow up Jan 16 at 11:30  Recommend nutrition evaluation by PCP Decrease alcohol and recommend discontinuing tobacco    For psychiatric meds, mood meds: Please have your primary care physician manage these medications.  If you have any severe symptoms of a stroke, or other severe issues such as confusion,severe chills or fever, etc call 911 or go to the ER as you may need to be evaluated further   For guidance regarding [facility] and if placement were needed at the facility, contact Social Worker tel: [phone]  For assessment of decision of mental capacity and competency:  Call Dr. Erick Blinks, geriatric psychiatrist at [phone]  Counseling regarding caregiver distress, including caregiver depression, anxiety and issues regarding community resources, adult day care programs, adult living facilities, or memory care questions:  please contact your  Primary Doctor's Social Worker   Whom to call: Memory  decline, memory medications: Call our office [phone]    [email or web address]   RECOMMENDATIONS FOR ALL PATIENTS WITH MEMORY PROBLEMS: 1. Continue to exercise (Recommend 30 minutes of walking everyday, or 3 hours every week) 2. Increase social interactions - continue going to [place] and enjoy social gatherings with friends and family 3. Eat healthy, avoid fried foods and eat more fruits and vegetables 4. Maintain adequate blood pressure, blood sugar, and blood cholesterol level. Reducing the risk of stroke and cardiovascular disease also helps promoting better memory. 5. Avoid stressful situations. Live a simple life and avoid aggravations. Organize  your time and prepare for the next day in anticipation. 6. Sleep well, avoid any interruptions of sleep and avoid any distractions in the bedroom that may interfere with adequate sleep quality 7. Avoid sugar, avoid sweets as there is a strong link between excessive sugar intake, diabetes, and cognitive impairment We discussed the Mediterranean diet, which has been shown to help patients reduce the risk of progressive memory disorders and reduces cardiovascular risk. This includes eating fish, eat fruits and green leafy vegetables, nuts like almonds and hazelnuts, walnuts, and also use olive oil. Avoid fast foods and fried foods as much as possible. Avoid sweets and sugar as sugar use has been linked to worsening of memory function.  There is always a concern of gradual progression of memory problems. If this is the case, then we may need to adjust level of care according to patient needs. Support, both to the patient and caregiver, should then be put into place.      You have been referred for a neuropsychological evaluation (i.e., evaluation of memory and thinking abilities). Please bring someone with you to this appointment if possible, as it is helpful for the doctor to hear from both you and another adult who knows you well. Please bring eyeglasses and hearing aids if you wear them.    The evaluation will take approximately 3 hours and has two parts:   The first part is a clinical interview with the neuropsychologist (Dr. Milbert Coulter or Dr. Roseanne Reno). During the interview, the neuropsychologist will speak with you and the individual you brought to the appointment.    The second part of the evaluation is testing with the doctor's technician Annabelle Harman or Selena Batten). During the testing, the technician will ask you to remember  different types of material, solve problems, and answer some questionnaires. Your family member will not be present for this portion of the evaluation.   Please note: We must reserve several  hours of the neuropsychologist's time and the psychometrician's time for your evaluation appointment. As such, there is a No-Show fee of $100. If you are unable to attend any of your appointments, please contact our office as soon as possible to reschedule.      DRIVING: Regarding driving, in patients with progressive memory problems, driving will be impaired. We advise to have someone else do the driving if trouble finding directions or if minor accidents are reported. Independent driving assessment is available to determine safety of driving.   If you are interested in the driving assessment, you can contact the following:  [facility] in [place] [phone]  [facility] [phone]  [facility] [phone]  [facility] [phone] or [phone]   FALL PRECAUTIONS: Be cautious when walking. Scan the area for obstacles that may increase the risk of trips and falls. When getting up in the mornings, sit up at the edge of the bed for a few minutes before getting out of bed. Consider elevating the bed at the head end to avoid drop of blood pressure when getting up. Walk always in a well-lit room (use night lights in the walls). Avoid area rugs or power cords from appliances in the middle of the walkways. Use a walker or a cane if necessary and consider physical therapy for balance exercise. Get your eyesight checked regularly.  FINANCIAL OVERSIGHT: Supervision, especially oversight when making financial decisions or transactions is also recommended.  HOME SAFETY: Consider the safety of the kitchen when operating appliances like stoves, microwave oven, and blender. Consider having supervision and share cooking responsibilities until no longer able to participate in those. Accidents with firearms and other hazards in the house should be identified and addressed as well.   ABILITY TO BE LEFT ALONE: If patient is unable to contact 911  operator, consider using LifeLine, or when the need is there, arrange for someone to stay with patients. Smoking is a fire hazard, consider supervision or cessation. Risk of wandering should be assessed by caregiver and if detected at any point, supervision and safe proof recommendations should be instituted.  MEDICATION SUPERVISION: Inability to self-administer medication needs to be constantly addressed. Implement a mechanism to ensure safe administration of the medications.      Mediterranean Diet A Mediterranean diet refers to food and lifestyle choices that are based on the traditions of countries located on the [place]. This way of eating has been shown to help prevent certain conditions and improve outcomes for people who have chronic diseases, like kidney disease and heart disease. What are tips for following this plan? Lifestyle  Cook and eat meals together with your family, when possible. Drink enough fluid to keep your urine clear or pale yellow. Be physically active every day. This includes: Aerobic exercise like running or swimming. Leisure activities like gardening, walking, or housework. Get 7-8 hours of sleep each night. If recommended by your health care provider, drink red wine in moderation. This means 1 glass a day for nonpregnant women and 2 glasses a day for men. A glass of wine equals 5 oz (150 mL). Reading food labels  Check the serving size of packaged foods. For foods such as rice and pasta, the serving size refers to the amount of cooked product, not dry. Check the total  fat in packaged foods. Avoid foods that have saturated fat or trans fats. Check the ingredients list for added sugars, such as corn syrup. Shopping  At the grocery store, buy most of your food from the areas near the walls of the store. This includes: Fresh fruits and vegetables (produce). Grains, beans, nuts, and seeds. Some of these may be available in unpackaged forms or large amounts  (in bulk). Fresh seafood. Poultry and eggs. Low-fat dairy products. Buy whole ingredients instead of prepackaged foods. Buy fresh fruits and vegetables in-season from local farmers markets. Buy frozen fruits and vegetables in resealable bags. If you do not have access to quality fresh seafood, buy precooked frozen shrimp or canned fish, such as tuna, salmon, or sardines. Buy small amounts of raw or cooked vegetables, salads, or olives from the deli or salad bar at your store. Stock your pantry so you always have certain foods on hand, such as olive oil, canned tuna, canned tomatoes, rice, pasta, and beans. Cooking  Cook foods with extra-virgin olive oil instead of using butter or other vegetable oils. Have meat as a side dish, and have vegetables or grains as your main dish. This means having meat in small portions or adding small amounts of meat to foods like pasta or stew. Use beans or vegetables instead of meat in common dishes like chili or lasagna. Experiment with different cooking methods. Try roasting or broiling vegetables instead of steaming or sauteing them. Add frozen vegetables to soups, stews, pasta, or rice. Add nuts or seeds for added healthy fat at each meal. You can add these to yogurt, salads, or vegetable dishes. Marinate fish or vegetables using olive oil, lemon juice, garlic, and fresh herbs. Meal planning  Plan to eat 1 vegetarian meal one day each week. Try to work up to 2 vegetarian meals, if possible. Eat seafood 2 or more times a week. Have healthy snacks readily available, such as: Vegetable sticks with hummus. Greek yogurt. Fruit and nut trail mix. Eat balanced meals throughout the week. This includes: Fruit: 2-3 servings a day Vegetables: 4-5 servings a day Low-fat dairy: 2 servings a day Fish, poultry, or lean meat: 1 serving a day Beans and legumes: 2 or more servings a week Nuts and seeds: 1-2 servings a day Whole grains: 6-8 servings a  day Extra-virgin olive oil: 3-4 servings a day Limit red meat and sweets to only a few servings a month What are my food choices? Mediterranean diet Recommended Grains: Whole-grain pasta. Brown rice. Bulgar wheat. Polenta. Couscous. Whole-wheat bread. Orpah Cobb. Vegetables: Artichokes. Beets. Broccoli. Cabbage. Carrots. Eggplant. Green beans. Chard. Kale. Spinach. Onions. Leeks. Peas. Squash. Tomatoes. Peppers. Radishes. Fruits: Apples. Apricots. Avocado. Berries. Bananas. Cherries. Dates. Figs. Grapes. Lemons. Melon. Oranges. Peaches. Plums. Pomegranate. Meats and other protein foods: Beans. Almonds. Sunflower seeds. Pine nuts. Peanuts. Cod. Salmon. Scallops. Shrimp. Tuna. Tilapia. Clams. Oysters. Eggs. Dairy: Low-fat milk. Cheese. Greek yogurt. Beverages: Water. Red wine. Herbal tea. Fats and oils: Extra virgin olive oil. Avocado oil. Grape seed oil. Sweets and desserts: [place] yogurt with honey. Baked apples. Poached pears. Trail mix. Seasoning and other foods: Basil. Cilantro. Coriander. Cumin. Mint. Parsley. Sage. Rosemary. Tarragon. Garlic. Oregano. Thyme. Pepper. Balsalmic vinegar. Tahini. Hummus. Tomato sauce. Olives. Mushrooms. Limit these Grains: Prepackaged pasta or rice dishes. Prepackaged cereal with added sugar. Vegetables: Deep fried potatoes (french fries). Fruits: Fruit canned in syrup. Meats and other protein foods: Beef. Pork. Lamb. Poultry with skin. Hot dogs. Tomasa Blase. Dairy: Ice cream. Sour cream. Whole  milk. Beverages: Juice. Sugar-sweetened soft drinks. Beer. Liquor and spirits. Fats and oils: Butter. Canola oil. Vegetable oil. Beef fat (tallow). Lard. Sweets and desserts: Cookies. Cakes. Pies. Candy. Seasoning and other foods: Mayonnaise. Premade sauces and marinades. The items listed may not be a complete list. Talk with your dietitian about what dietary choices are right for you. Summary The Mediterranean diet includes both food and lifestyle choices. Eat a  variety of fresh fruits and vegetables, beans, nuts, seeds, and whole grains. Limit the amount of red meat and sweets that you eat. Talk with your health care provider about whether it is safe for you to drink red wine in moderation. This means 1 glass a day for nonpregnant women and 2 glasses a day for men. A glass of wine equals 5 oz (150 mL). This information is not intended to replace advice given to you by your health care provider. Make sure you discuss any questions you have with your health care provider. Document Released: 07/14/2016 Document Revised: 08/16/2016 Document Reviewed: 07/14/2016 Elsevier Interactive Patient Education  2017 [employer].

## 2023-09-21 LAB — IRON AND TIBC
Iron Saturation: 39 % (ref 15–55)
Iron: 141 ug/dL — ABNORMAL HIGH (ref 27–139)
Total Iron Binding Capacity: 366 ug/dL (ref 250–450)
UIBC: 225 ug/dL (ref 118–369)

## 2023-09-21 NOTE — Progress Notes (Signed)
Iron studies are normal. Follow with PCP Thanks

## 2023-09-24 LAB — VITAMIN B6: Vitamin B6: 13.6 ng/mL (ref 2.1–21.7)

## 2023-09-25 ENCOUNTER — Ambulatory Visit (INDEPENDENT_AMBULATORY_CARE_PROVIDER_SITE_OTHER): Payer: HMO

## 2023-09-25 DIAGNOSIS — Z681 Body mass index (BMI) 19 or less, adult: Secondary | ICD-10-CM

## 2023-09-25 DIAGNOSIS — R636 Underweight: Secondary | ICD-10-CM | POA: Diagnosis not present

## 2023-09-25 DIAGNOSIS — Z0000 Encounter for general adult medical examination without abnormal findings: Secondary | ICD-10-CM | POA: Diagnosis not present

## 2023-09-25 NOTE — Patient Instructions (Signed)
Lindsey Rowland , Thank you for taking time to come for your Medicare Wellness Visit. I appreciate your ongoing commitment to your health goals. Please review the following plan we discussed and let me know if I can assist you in the future.   Referrals/Orders/Follow-Ups/Clinician Recommendations: Referral to [facility]  This is a list of the screening recommended for you and due dates:  Health Maintenance  Topic Date Due   Zoster (Shingles) Vaccine (1 of 2) Never done   Mammogram  05/26/2021   Medicare Annual Wellness Visit  09/24/2024   Colon Cancer Screening  12/25/2024   Pneumonia Vaccine  Completed   Flu Shot  Completed   DEXA scan (bone density measurement)  Completed   Hepatitis C Screening  Completed   HPV Vaccine  Aged Out   DTaP/Tdap/Td vaccine  Discontinued   COVID-19 Vaccine  Discontinued    Advanced directives: (Copy Requested) Please bring a copy of your health care power of attorney and living will to the office to be added to your chart at your convenience.  Next Medicare Annual Wellness Visit scheduled for next year: No, will schedule next year  Insert Preventive Care attachment Insert FALL PREVENTION attachment if needed

## 2023-09-25 NOTE — Progress Notes (Signed)
Subjective:   Lindsey Rowland is a 72 y.o. female who presents for Medicare Annual (Subsequent) preventive examination.  Visit Complete: Virtual I connected with  Lindsey Rowland on 09/25/23 by a audio enabled telemedicine application and verified that I am speaking with the correct person using two identifiers. Daughter Lindsey Rowland was also on the call.  Patient Location: Home  Provider Location: Office/Clinic  I discussed the limitations of evaluation and management by telemedicine. The patient expressed understanding and agreed to proceed.  Vital Signs: Because this visit was a virtual/telehealth visit, some criteria may be missing or patient reported. Any vitals not documented were not able to be obtained and vitals that have been documented are patient reported.  Patient Medicare AWV questionnaire was completed by the patient on 09/25/2023; I have confirmed that all information answered by patient is correct and no changes since this date.  Cardiac Risk Factors include: advanced age (>21men, >91 women)     Objective:    Today's Vitals   There is no height or weight on file to calculate BMI.     09/25/2023    3:12 PM 09/20/2023   10:02 AM 08/24/2023   11:21 AM 02/20/2023   11:02 AM 09/21/2022    2:27 PM 08/18/2022   11:22 AM 02/03/2022   11:09 AM  Advanced Directives  Does Patient Have a Medical Advance Directive? Yes Yes Yes Yes Yes Yes Yes  Type of Sales promotion account executive of Attorney Living will  Healthcare Power of [place];Living will Healthcare Power of [place];Living will   Does patient want to make changes to medical advance directive?  No - Patient declined  No - Patient declined   No - Patient declined  Copy of Healthcare Power of Attorney in Chart?  No - copy requested No - copy requested  No - copy requested      Current Medications (verified) Outpatient Encounter Medications as of 09/25/2023  Medication Sig   albuterol  (VENTOLIN HFA) 108 (90 Base) MCG/ACT inhaler 1 puff every 8 hours as needed   albuterol (VENTOLIN HFA) 108 (90 Base) MCG/ACT inhaler Inhale 2 puffs into the lungs every 6 (six) hours as needed for wheezing or shortness of breath.   Aspirin-Salicylamide-Caffeine (BC HEADACHE POWDER PO) Take 1 packet by mouth daily as needed (for pain or headache).    celecoxib (CELEBREX) 200 MG capsule TAKE 1 CAPSULE BY MOUTH EVERY DAY AS NEEDED   diphenoxylate-atropine (LOMOTIL) 2.5-0.025 MG tablet TAKE 1 TABLET BY MOUTH 4 (FOUR) TIMES DAILY AS NEEDED FOR DIARRHEA OR LOOSE STOOLS.   donepezil (ARICEPT) 5 MG tablet Take 1 tablet (5 mg total) by mouth daily.   mometasone (NASONEX) 50 MCG/ACT nasal spray PLACE 2 SPRAYS INTO THE NOSE DAILY. (Patient taking differently: Place 2 sprays into the nose daily. As needed)   Multiple Vitamin (MULITIVITAMIN WITH MINERALS) TABS Take 1 tablet by mouth daily.   pantoprazole (PROTONIX) 40 MG tablet TAKE 1 TABLET BY MOUTH EVERY DAY   Polyethyl Glycol-Propyl Glycol (SYSTANE OP) Place 1 drop into both eyes daily as needed (for dry eyes).   pregabalin (LYRICA) 25 MG capsule TAKE 1 CAPSULE BY MOUTH AT BEDTIME.   Tiotropium Bromide-Olodaterol (STIOLTO RESPIMAT) 2.5-2.5 MCG/ACT AERS INHALE 2 PUFFS BY MOUTH INTO THE LUNGS DAILY   cyclobenzaprine (FLEXERIL) 10 MG tablet Take 10 mg by mouth 3 (three) times daily as needed. (Patient not taking: Reported on 09/20/2023)   megestrol (MEGACE ES) 625 MG/5ML suspension TAKE 1/2 TSP  DAILY. (Patient not taking: Reported on 09/19/2023)   No facility-administered encounter medications on file as of 09/25/2023.    Allergies (verified) Amoxicillin   History: Past Medical History:  Diagnosis Date   Arthritis    Cancer (HCC)    "pre-cervical" cells  - age 78   COPD (chronic obstructive pulmonary disease) (HCC)    Headache(784.0)    History of radiation therapy 09/10/2018-09/17/2018   SBRT 3 fx to right lung; Dr. Antony Blackbird   Osteopenia     Polio    as a child   Ulcer    stomach hx   Past Surgical History:  Procedure Laterality Date   APPENDECTOMY     BREAST BIOPSY Right 2013   CATARACT EXTRACTION     right   DILATION AND CURETTAGE OF UTERUS     FOOT SURGERY Bilateral    hip si joint     injection/ablation   RETINAL DETACHMENT SURGERY  12/2010   right eye   TONSILLECTOMY     VIDEO BRONCHOSCOPY WITH ENDOBRONCHIAL NAVIGATION Right 08/01/2018   Procedure: VIDEO BRONCHOSCOPY WITH ENDOBRONCHIAL NAVIGATION Fiducial Placement;  Surgeon: Leslye Peer, MD;  Location: [facility] OR;  Service: Thoracic;  Laterality: Right;   Family History  Problem Relation Age of Onset   Hiatal hernia Father    Colon cancer Neg Hx    Esophageal cancer Neg Hx    Stomach cancer Neg Hx    Rectal cancer Neg Hx    Social History   Socioeconomic History   Marital status: Married    Spouse name: Not on file   Number of children: 2   Years of education: Not on file   Highest education level: Some college, no degree  Occupational History   Not on file  Tobacco Use   Smoking status: Every Day    Current packs/day: 0.25    Average packs/day: 0.3 packs/day for 40.0 years (10.0 ttl pk-yrs)    Types: Cigarettes   Smokeless tobacco: Never   Tobacco comments:    currently smoking 5-6  cigs per day  Vaping Use   Vaping status: Never Used  Substance and Sexual Activity   Alcohol use: Yes    Alcohol/week: 3.0 standard drinks of alcohol    Types: 3 Cans of beer per week   Drug use: No   Sexual activity: Never  Other Topics Concern   Not on file  Social History Narrative   Right handed   Drinks caffeine prn   Lives with husband   [place]   retired   International aid/development worker of Corporate investment banker Strain: Low Risk  (09/21/2022)   Overall Financial Resource Strain (CARDIA)    Difficulty of Paying Living Expenses: Not hard at all  Food Insecurity: Food Insecurity Present (09/25/2023)   Hunger Vital Sign    Worried About Running Out of  Food in the Last Year: Never true    Ran Out of Food in the Last Year: Sometimes true  Transportation Needs: No Transportation Needs (09/25/2023)   PRAPARE - Administrator, Civil Service (Medical): No    Lack of Transportation (Non-Medical): No  Physical Activity: Inactive (09/25/2023)   Exercise Vital Sign    Days of Exercise per Week: 0 days    Minutes of Exercise per Session: 0 min  Stress: No Stress Concern Present (09/25/2023)   [employer] of Occupational Health - Occupational Stress Questionnaire    Feeling of Stress : Only a little  Social Connections: Unknown (09/25/2023)   Social Connection and Isolation Panel [NHANES]    Frequency of Communication with Friends and Family: More than three times a week    Frequency of Social Gatherings with Friends and Family: Once a week    Attends Religious Services: Not on Marketing executive or Organizations: No    Attends Banker Meetings: Never    Marital Status: Married    Tobacco Counseling Ready to quit: Yes Counseling given: Not Answered Tobacco comments: currently smoking 5-6  cigs per day   Clinical Intake:  Pre-visit preparation completed: Yes  Pain : No/denies pain     Nutritional Risks: None Diabetes: No  How often do you need to have someone help you when you read instructions, pamphlets, or other written materials from your doctor or pharmacy?: 1 - Never  Interpreter Needed?: No  Information entered by :: NAllen LPN   Activities of Daily Living    09/25/2023    2:49 PM  In your present state of health, do you have any difficulty performing the following activities:  Hearing? 0  Vision? 0  Difficulty concentrating or making decisions? 1  Walking or climbing stairs? 1  Dressing or bathing? 0  Doing errands, shopping? 0  Preparing Food and eating ? N  Using the Toilet? N  In the past six months, have you accidently leaked urine? N  Do you have problems with  loss of bowel control? Y  Comment ocassionally  Managing your Medications? N  Managing your Finances? Y  Comment daughter states forgets to pay bills  Housekeeping or managing your Housekeeping? N    Patient Care Team: Mliss Sax, MD as PCP - General (Family Medicine)  Indicate any recent Medical Services you may have received from other than [facility] providers in the past year (date may be approximate).     Assessment:   This is a routine wellness examination for [place].  Hearing/Vision screen Hearing Screening - Comments:: Denies hearing issues Vision Screening - Comments:: Regular eye exams, [facility]   Goals Addressed             This Visit's Progress    Patient Stated       09/25/2023, denies goals       Depression Screen    09/25/2023    3:13 PM 05/23/2023    2:25 PM 05/09/2023    1:40 PM 09/22/2022    1:06 PM 09/21/2022    2:26 PM 05/09/2022   10:07 AM 11/08/2021    9:26 AM  PHQ 2/9 Scores  PHQ - 2 Score 0 0 0 0 0 0 0    Fall Risk    09/25/2023    2:49 PM 09/20/2023   10:02 AM 06/02/2023    4:15 PM 05/23/2023    2:25 PM 05/09/2023    1:40 PM  Fall Risk   Falls in the past year? 1 1 0 0   Comment per daughter      Number falls in past yr: 1 1 0 0 0  Injury with Fall? 1 1 0 0 0  Comment cut leg open, per daughter      Risk for fall due to : Medication side effect;History of fall(s)  No Fall Risks No Fall Risks No Fall Risks  Follow up Falls prevention discussed;Falls evaluation completed Falls evaluation completed Falls evaluation completed Falls evaluation completed Falls evaluation completed    MEDICARE RISK AT HOME: Medicare  Risk at Home Any stairs in or around the home?: No If so, are there any without handrails?: No Home free of loose throw rugs in walkways, pet beds, electrical cords, etc?: Yes Adequate lighting in your home to reduce risk of falls?: Yes Life alert?: No Use of a cane, walker or w/c?: No Grab bars in the  bathroom?: Yes Shower chair or bench in shower?: No Elevated toilet seat or a handicapped toilet?: No  TIMED UP AND GO:  Was the test performed?  No    Cognitive Function:  6 CIT not administered. Patient has diagnosis of Alzheimer's. She is followed by neurology.       09/20/2023   10:00 AM  Montreal Cognitive Assessment   Visuospatial/ Executive (0/5) 4  Naming (0/3) 3  Attention: Read list of digits (0/2) 2  Attention: Read list of letters (0/1) 1  Attention: Serial 7 subtraction starting at 100 (0/3) 0  Language: Repeat phrase (0/2) 0  Language : Fluency (0/1) 1  Abstraction (0/2) 0  Delayed Recall (0/5) 0  Orientation (0/6) 4  Total 15  Adjusted Score (based on education) 15      09/21/2022    2:28 PM 08/28/2021   10:43 AM  6CIT Screen  What Year? 0 points 0 points  What month? 0 points 0 points  What time? 0 points 0 points  Count back from 20 0 points 0 points  Months in reverse 0 points 0 points  Repeat phrase 0 points 0 points  Total Score 0 points 0 points    Immunizations Immunization History  Administered Date(s) Administered   Fluad Quad(high Dose 65+) 08/18/2022   Influenza Split 09/20/2011, 08/31/2012   Influenza Whole 10/05/2005, 09/26/2007, 09/16/2008, 09/02/2009, 09/30/2010   Influenza, High Dose Seasonal PF 09/05/2017, 08/21/2018, 08/21/2019, 08/12/2020, 08/15/2023   Influenza,inj,Quad PF,6+ Mos 08/19/2014   Influenza-Unspecified 09/16/2015, 08/10/2021   Pneumococcal Conjugate-13 11/02/2015   Pneumococcal Polysaccharide-23 01/20/2017   Td 09/04/2003   Tdap 04/23/2012    TDAP status: Due, Education has been provided regarding the importance of this vaccine. Advised may receive this vaccine at local pharmacy or Health Dept. Aware to provide a copy of the vaccination record if obtained from local pharmacy or Health Dept. Verbalized acceptance and understanding.  Flu Vaccine status: Up to date  Pneumococcal vaccine status: Up to  date  Covid-19 vaccine status: Information provided on how to obtain vaccines.   Qualifies for Shingles Vaccine? Yes   Zostavax completed No   Shingrix Completed?: No.    Education has been provided regarding the importance of this vaccine. Patient has been advised to call insurance company to determine out of pocket expense if they have not yet received this vaccine. Advised may also receive vaccine at local pharmacy or Health Dept. Verbalized acceptance and understanding.  Screening Tests Health Maintenance  Topic Date Due   Zoster Vaccines- Shingrix (1 of 2) Never done   MAMMOGRAM  05/26/2021   Medicare Annual Wellness (AWV)  09/24/2024   Colonoscopy  12/25/2024   Pneumonia Vaccine 51+ Years old  Completed   INFLUENZA VACCINE  Completed   DEXA SCAN  Completed   Hepatitis C Screening  Completed   HPV VACCINES  Aged Out   DTaP/Tdap/Td  Discontinued   COVID-19 Vaccine  Discontinued    Health Maintenance  Health Maintenance Due  Topic Date Due   Zoster Vaccines- Shingrix (1 of 2) Never done   MAMMOGRAM  05/26/2021    Colorectal cancer screening: Type of screening:  Colonoscopy. Completed 12/25/2014. Repeat every 10 years  Mammogram status: No longer required due to age.  Bone Density status: Completed 10/19/2007.   Lung Cancer Screening: (Low Dose CT Chest recommended if Age 47-80 years, 20 pack-year currently smoking OR have quit w/in 15years.) does not qualify.   Lung Cancer Screening Referral: no  Additional Screening:  Hepatitis C Screening: does qualify; Completed 01/22/2018  Vision Screening: Recommended annual ophthalmology exams for early detection of glaucoma and other disorders of the eye. Is the patient up to date with their annual eye exam?  Yes  Who is the provider or what is the name of the office in which the patient attends annual eye exams? [facility]  If pt is not established with a provider, would they like to be referred to a provider to establish care? No  .   Dental Screening: Recommended annual dental exams for proper oral hygiene  Diabetic Foot Exam: n/a  Community Resource Referral / Chronic Care Management: CRR required this visit?  No   CCM required this visit?  No     Plan:     I have personally reviewed and noted the following in the patient's chart:   Medical and social history Use of alcohol, tobacco or illicit drugs  Current medications and supplements including opioid prescriptions. Patient is not currently taking opioid prescriptions. Functional ability and status Nutritional status Physical activity Advanced directives List of other physicians Hospitalizations, surgeries, and ER visits in previous 12 months Vitals Screenings to include cognitive, depression, and falls Referrals and appointments  In addition, I have reviewed and discussed with patient certain preventive protocols, quality metrics, and best practice recommendations. A written personalized care plan for preventive services as well as general preventive health recommendations were provided to patient.     Barb Merino, LPN   32/44/0102   After Visit Summary: (MyChart) Due to this being a telephonic visit, the after visit summary with patients personalized plan was offered to patient via MyChart   Nurse Notes: none

## 2023-09-26 NOTE — Addendum Note (Signed)
Addended by: Nadene Rubins A on: 09/26/2023 08:00 AM   Modules accepted: Orders

## 2023-09-27 ENCOUNTER — Telehealth: Payer: Self-pay | Admitting: *Deleted

## 2023-09-27 NOTE — Progress Notes (Unsigned)
Care Coordination  Outreach Note  09/27/2023 Name: Lindsey Rowland MRN: [id number] DOB: 10-24-1951   Care Coordination Outreach Attempts: An unsuccessful telephone outreach was attempted today to offer the patient information about available care coordination services.  Follow Up Plan:  Additional outreach attempts will be made to offer the patient care coordination information and services.   Encounter Outcome:  No Answer  Gwenevere Ghazi  Care Coordination Care Guide  Direct Dial: [phone]

## 2023-09-28 NOTE — Progress Notes (Addendum)
Care Coordination   Note   09/28/2023 Name: Lindsey Rowland MRN: [id number] DOB: 09/16/1951  Lindsey Rowland is a 72 y.o. year old female who sees Mliss Sax, MD for primary care. I reached out to Regina Eck by phone today to offer care coordination services.  [name] was given information about Care Coordination services today including:   The Care Coordination services include support from the care team which includes your Nurse Coordinator, Clinical Social Worker, or Pharmacist.  The Care Coordination team is here to help remove barriers to the health concerns and goals most important to you. Care Coordination services are voluntary, and the patient may decline or stop services at any time by request to their care team member.   Care Coordination Consent Status: Patient Daughter [name] on file agreed to services and verbal consent obtained.   Follow up plan:  Telephone appointment with care coordination team member scheduled for:  10/03/23  Encounter Outcome:  Patient Scheduled  [facility] Coordination Care Guide  Direct Dial: [phone]

## 2023-10-03 ENCOUNTER — Ambulatory Visit: Payer: Self-pay | Admitting: *Deleted

## 2023-10-03 NOTE — Patient Outreach (Signed)
Care Coordination   Initial Visit Note   10/04/2023 Name: RAAVI STARLIPER MRN: [id number] DOB: 09/19/1951  TANNETTE CLEMENSON is a 72 y.o. year old female who sees Mliss Sax, MD for primary care. I  spoke with pt's daughter, Eddie Candle, by phone today.  What matters to the patients health and wellness today?  Daughter/HCPOA indicates concerns related to pt's cognition, alcohol use, safety and overall health and well-being and is seeking guidance/resources and support.    Goals Addressed             This Visit's Progress    Provide support, resources and assistance with care needs at home       Activities and task to complete in order to accomplish goals.   Keep all upcoming appointment discussed today Continue with compliance of taking medication prescribed by Doctor Consider the suggestions made for safety, home modifications, family meeting,etc Continue to work with [employer] Consider private pay home care options as discussed Utilize your HTA "PAPA PALS" caregiver hours before the end of year if needed for home companion/caregiver support        SDOH assessments and interventions completed:  Yes  SDOH Interventions Today    Flowsheet Row Most Recent Value  SDOH Interventions   Food Insecurity Interventions Intervention Not Indicated  Housing Interventions Intervention Not Indicated  Transportation Interventions Intervention Not Indicated  Utilities Interventions Intervention Not Indicated  Financial Strain Interventions Intervention Not Indicated        Care Coordination Interventions:  Yes, provided  Interventions Today    Flowsheet Row Most Recent Value  Chronic Disease   Chronic disease during today's visit Other  [Cognitive Impairment]  General Interventions   General Interventions Discussed/Reviewed [facility]  [Discussed community resources with dtr- Adult Day Care, ALF, etc]  Education Interventions   Education Provided Provided  Education, Provided Web-based Education  Provided Verbal Education On Mental Health/Coping with Illness  Mental Health Interventions   Mental Health Discussed/Reviewed Substance Abuse  [Per daughter, pt drives to the [employer] daily to purchase beer that she drinks every evening (per family).]  Nutrition Interventions   Nutrition Discussed/Reviewed Nutrition Discussed  [Daughter concerned about pt's weight loss and current weight of 79 LBS. She is hoping for  Nutritionist consultation-]  Safety Interventions   Safety Discussed/Reviewed Safety Discussed  [Pt lives with her husband ( who is unable to walk per familiy) and a 22yo niece(who helps care/supervise).]  Advanced Directive Interventions   Advanced Directives Discussed/Reviewed Advanced Directives Discussed, Guardianship  [Daughter, Eddie Candle, states she is Product manager and her sister FInancial POA.]  Agricultural consultant indicates they are working with an Retail buyer to assist with legal affairs- including possible Guardianship]       Follow up plan: Follow up call scheduled for 10/31/23    Encounter Outcome:  Patient Visit Completed

## 2023-10-04 NOTE — Patient Instructions (Signed)
Visit Information  Thank you for taking time to visit with me today. Please don't hesitate to contact me if I can be of assistance to you.   Following are the goals we discussed today:   Goals Addressed             This Visit's Progress    Provide support, resources and assistance with care needs at home       Activities and task to complete in order to accomplish goals.   Keep all upcoming appointment discussed today Continue with compliance of taking medication prescribed by Doctor Consider the suggestions made for safety, home modifications, family meeting,etc Continue to work with [employer] Consider private pay home care options as discussed Utilize your HTA "PAPA PALS" caregiver hours before the end of year if needed for home companion/caregiver support        Our next appointment is by telephone on 10/31/23   Please call the care guide team at [phone] if you need to cancel or reschedule your appointment.   If you are experiencing a Mental Health or Behavioral Health Crisis or need someone to talk to, please call the Suicide and Crisis Lifeline: 988 call 911   The patient verbalized understanding of instructions, educational materials, and care plan provided today and DECLINED offer to receive copy of patient instructions, educational materials, and care plan.   Telephone follow up appointment with care management team member scheduled for:10/31/23  Reece Levy, MSW, LCSW [facility]  [facility], [facility] Licensed Clinical Social Worker Care Coordinator  [phone]

## 2023-10-12 ENCOUNTER — Encounter: Payer: Self-pay | Admitting: Psychology

## 2023-10-13 ENCOUNTER — Encounter: Payer: Self-pay | Admitting: Psychology

## 2023-10-13 ENCOUNTER — Ambulatory Visit: Payer: HMO | Admitting: Psychology

## 2023-10-13 ENCOUNTER — Ambulatory Visit (INDEPENDENT_AMBULATORY_CARE_PROVIDER_SITE_OTHER): Payer: HMO | Admitting: Psychology

## 2023-10-13 DIAGNOSIS — R4189 Other symptoms and signs involving cognitive functions and awareness: Secondary | ICD-10-CM

## 2023-10-13 DIAGNOSIS — Z789 Other specified health status: Secondary | ICD-10-CM

## 2023-10-13 DIAGNOSIS — G3184 Mild cognitive impairment, so stated: Secondary | ICD-10-CM | POA: Insufficient documentation

## 2023-10-13 HISTORY — DX: Mild cognitive impairment of uncertain or unknown etiology: G31.84

## 2023-10-13 NOTE — Progress Notes (Signed)
   Psychometrician Note   Cognitive testing was administered to Lindsey Rowland by Wallace Keller, B.S. (psychometrist) under the supervision of Dr. Newman Nickels, Ph.D., licensed psychologist on 10/13/2023. Lindsey Rowland did not appear overtly distressed by the testing session per behavioral observation or responses across self-report questionnaires. Rest breaks were offered.    The battery of tests administered was selected by Dr. Newman Nickels, Ph.D. with consideration to Lindsey Rowland's current level of functioning, the nature of her symptoms, emotional and behavioral responses during interview, level of literacy, observed level of motivation/effort, and the nature of the referral question. This battery was communicated to the psychometrist. Communication between Dr. Newman Nickels, Ph.D. and the psychometrist was ongoing throughout the evaluation and Dr. Newman Nickels, Ph.D. was immediately accessible at all times. Dr. Newman Nickels, Ph.D. provided supervision to the psychometrist on the date of this service to the extent necessary to assure the quality of all services provided.    Lindsey Rowland will return within approximately 1-2 weeks for an interactive feedback session with Dr. Milbert Coulter at which time her test performances, clinical impressions, and treatment recommendations will be reviewed in detail. Lindsey Rowland understands she can contact our office should she require our assistance before this time.  A total of 90 minutes of billable time were spent face-to-face with Lindsey Rowland by the psychometrist. This includes both test administration and scoring time. Billing for these services is reflected in the clinical report generated by Dr. Newman Nickels, Ph.D.  This note reflects time spent with the psychometrician and does not include test scores or any clinical interpretations made by Dr. Milbert Coulter. The full report will follow in a separate note.

## 2023-10-13 NOTE — Progress Notes (Unsigned)
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 Repetition Errors 5 (8) 25 Average       Language:     Verbal Fluency Test: Raw Score (T Score) Percentile     Phonemic Fluency (FAS) 41 (49) 46 Average    Animal Fluency 19 (52) 58 Average        NAB Language Module, Form 1: T Score Percentile     Naming 30/31 (58) 79 Above Average       Visuospatial/Visuoconstruction:      Raw Score Percentile   Clock Drawing: 8/10 --- Within Normal Limits       Mood and Personality:      Raw Score Percentile   Geriatric Depression Scale: 10 --- Mild  Geriatric Anxiety Scale: 11 --- Minimal    Somatic 7 --- Mild    Cognitive 2 --- Minimal    Affective 2 --- Minimal       Additional Questionnaires:      Raw Score Percentile   PROMIS Sleep Disturbance Questionnaire: 19 --- None to Slight   Informed Consent and Coding/Compliance:   The current evaluation represents a clinical evaluation for the purposes previously outlined by the referral source and is in no way reflective of a forensic evaluation.   Ms. Bresee was provided with a verbal description of the nature and  purpose of the present neuropsychological evaluation. Also reviewed were the foreseeable risks and/or discomforts and benefits of the procedure, limits of confidentiality, and mandatory reporting requirements of this provider. The patient was given the opportunity to ask questions and receive answers about the evaluation. Oral consent to participate was provided by the patient.   This evaluation was conducted by Newman Nickels, Ph.D., ABPP-CN, board certified clinical neuropsychologist. Ms. Merck completed a clinical interview with Dr. Milbert Coulter, billed as one unit [phone], and 90 minutes of cognitive testing and scoring, billed as one unit [phone] and two additional units [id number]. Psychometrist Wallace Keller, B.S. assisted Dr. Milbert Coulter with test administration and  scoring procedures. As a separate and discrete service, one unit [id number] and two units [phone] were billed for Dr. Tammy Sours time spent in interpretation and report writing.

## 2023-10-16 ENCOUNTER — Encounter: Payer: Self-pay | Admitting: Psychology

## 2023-10-23 ENCOUNTER — Ambulatory Visit: Payer: HMO | Admitting: Psychology

## 2023-10-23 DIAGNOSIS — G3184 Mild cognitive impairment, so stated: Secondary | ICD-10-CM | POA: Diagnosis not present

## 2023-10-23 DIAGNOSIS — Z789 Other specified health status: Secondary | ICD-10-CM | POA: Diagnosis not present

## 2023-10-23 NOTE — Progress Notes (Addendum)
   Neuropsychology Feedback Session Eligha Bridegroom. [facility] Department of Neurology  Reason for Referral:   Lindsey Rowland is a 72 y.o. right-handed Caucasian female referred by Marlowe Kays, PA-C, to characterize her current cognitive functioning and assist with diagnostic clarity and treatment planning in the context of subjective cognitive decline.   Feedback:   Lindsey Rowland completed a comprehensive neuropsychological evaluation on 10/13/2023. Please refer to that encounter for the full report and recommendations. Briefly, results suggested severe impairment surrounding cognitive flexibility and both encoding (i.e., learning) and delayed retrieval aspects of memory. A relative weakness was exhibited across processing speed while additional performance variability was exhibited across semantic fluency, visuospatial abilities, and recognition/consolidation aspects of memory. Regarding etiology, testing patterns do raise concern for an underlying neurodegenerative illness, namely Alzheimer's disease. Across memory testing, Lindsey Rowland did not benefit from repeated exposure to novel information, exhibiting flat learning curves. After a brief delay, retention rates were 0% and 16% across list and figure tasks respectively. While her retention rate was 50% for a story task, this amounted to her recalling only two details, suggesting ongoing impairment. Finally, performances across yes/no recognition trials were variable but below expectation overall. Taken together, this suggests evidence for rapid forgetting and an evolving and already fairly significant storage impairment, both of which are the hallmark testing patterns for this illness. Further weakness/variability across semantic fluency and cognitive flexibility would follow typical disease progression. Intact confrontation naming is encouraging in this regard. However, concerns for this illness remain. Elevated alcohol use/abuse may  exacerbate cognitive impairment and could put her at a greater risk for an accelerated decline in the future. Continued medical monitoring will be important moving forward.  Lindsey Rowland was accompanied by her daughter during the current telephone conference call. They were within their respective residences while I was within my office. I discussed the limitations of evaluation and management by telemedicine and the availability of in person appointments. Lindsey Rowland expressed her understanding and agreed to proceed. Content of the current session focused on the results of her neuropsychological evaluation. Lindsey Rowland was given the opportunity to ask questions and her questions were answered. She was encouraged to reach out should additional questions arise. A copy of her report was mailed at the conclusion of the visit.      One unit 96132 (30 minutes) was billed for Dr. Tammy Sours time spent preparing for, conducting, and documenting the current feedback session with Lindsey Rowland.

## 2023-10-26 ENCOUNTER — Ambulatory Visit: Payer: Self-pay | Admitting: *Deleted

## 2023-10-26 NOTE — Patient Instructions (Signed)
Visit Information  Thank you for taking time to visit with me today. Please don't hesitate to contact me if I can be of assistance to you.   Following are the goals we discussed today:   Goals Addressed   None     Our next appointment is by telephone on 11/07/23    Please call the care guide team at [phone] if you need to cancel or reschedule your appointment.   If you are experiencing a Mental Health or Behavioral Health Crisis or need someone to talk to, please call 911   The patient verbalized understanding of instructions, educational materials, and care plan provided today and DECLINED offer to receive copy of patient instructions, educational materials, and care plan.   Telephone follow up appointment with care management team member scheduled for:11/06/23 Face to Face appointment with care management team member scheduled for: 11/09/23  Reece Levy, MSW, LCSW [place]  [facility], [facility] Licensed Clinical Social Worker Care Coordinator  [phone]

## 2023-10-26 NOTE — Patient Outreach (Signed)
CSW received message to call pt's daughter regarding previous outreach call. Daughter was confused about who she had spoken with and CSW was able to retrieve note indicating she had spoken with me/this CSW. Daughter continues to have significant concerns about her mother. She reports her mother is still @ 80lbs, drinking, along with an overall failure to thrive presentation. Daughter shared that pt has been to the Neurologist and also to Neuro Psych assessments and is now awaiting an MRI next week.  CSW suggested daughter attempt to come to [place] (lives in [facility]) and attend her mother's next PCP visit in hopes of having a thorough discussion/intervention with PCP, pt, daughter, and ?others. Concerns related to her weight/malnourishment, drinking(pt denies but it has been observed), driving, cognitive state along with updates from pending MRI, etc.  CSW will communicate with PCP to alert him to this suggestion and plan.    Reece Levy, MSW, LCSW [place]  [facility], [facility] Licensed Clinical Social Worker Care Coordinator  [phone]

## 2023-10-29 ENCOUNTER — Ambulatory Visit
Admission: RE | Admit: 2023-10-29 | Discharge: 2023-10-29 | Disposition: A | Discharge status: Home / Self Care | Payer: HMO | Source: Ambulatory Visit | Attending: Physician Assistant | Admitting: Physician Assistant

## 2023-10-29 MED ORDER — GADOPICLENOL 0.5 MMOL/ML IV SOLN
4.0000 mL | Freq: Once | INTRAVENOUS | Status: AC | PRN
  Administered 2023-10-29: 4 mL via INTRAVENOUS

## 2023-10-31 ENCOUNTER — Encounter: Payer: HMO | Admitting: *Deleted

## 2023-11-06 ENCOUNTER — Ambulatory Visit: Payer: HMO | Admitting: Family Medicine

## 2023-11-06 ENCOUNTER — Telehealth: Payer: Self-pay | Admitting: Family Medicine

## 2023-11-06 NOTE — Telephone Encounter (Signed)
Yes pt's daughter knows the correct date.

## 2023-11-06 NOTE — Telephone Encounter (Signed)
FYI:Pt's daughter called in wanting to clarify when her mom's appointment is scheduled for. Eddie Candle, her daughter was very concerned. Her mom had told her she had an appt today 11/06/23 with Dr Doreene Burke for a private consultation.

## 2023-11-07 ENCOUNTER — Ambulatory Visit: Payer: Self-pay | Admitting: *Deleted

## 2023-11-08 NOTE — Patient Outreach (Addendum)
Pt's daughter indicates she is sick and should not be traveling from [facility] for PCP visit with pt/mom this week. Suggested she call and get rescheduled- will plan to attend the visit if possible- Reece Levy, MSW, LCSW [place], [facility] Licensed Clinical Social Worker Care Coordinator  [phone]

## 2023-11-09 ENCOUNTER — Ambulatory Visit: Payer: HMO | Admitting: Family Medicine

## 2023-11-09 ENCOUNTER — Other Ambulatory Visit: Payer: Self-pay | Admitting: Pulmonary Disease

## 2023-11-09 ENCOUNTER — Encounter: Payer: Self-pay | Admitting: *Deleted

## 2023-11-09 ENCOUNTER — Other Ambulatory Visit: Payer: Self-pay | Admitting: Family Medicine

## 2023-11-09 DIAGNOSIS — J449 Chronic obstructive pulmonary disease, unspecified: Secondary | ICD-10-CM

## 2023-11-09 DIAGNOSIS — K219 Gastro-esophageal reflux disease without esophagitis: Secondary | ICD-10-CM

## 2023-11-15 ENCOUNTER — Other Ambulatory Visit: Payer: Self-pay | Admitting: Family Medicine

## 2023-11-15 DIAGNOSIS — M1990 Unspecified osteoarthritis, unspecified site: Secondary | ICD-10-CM

## 2023-11-15 DIAGNOSIS — Z8612 Personal history of poliomyelitis: Secondary | ICD-10-CM

## 2023-11-15 DIAGNOSIS — M129 Arthropathy, unspecified: Secondary | ICD-10-CM

## 2023-11-16 NOTE — Progress Notes (Signed)
 MRI brain is normal. Thanks

## 2023-11-17 ENCOUNTER — Telehealth: Payer: Self-pay | Admitting: Family Medicine

## 2023-11-17 NOTE — Telephone Encounter (Signed)
Prescription Request  11/17/2023  LOV: 08/02/2023  What is the name of the medication or equipment? celecoxib (CELEBREX) 200 MG capsule   Have you contacted your pharmacy to request a refill? Yes   Which pharmacy would you like this sent to?  [id number] - [place], [place] - [address] [place] [address] Phone: [phone] Fax: [phone]    Patient notified that their request is being sent to the clinical staff for review and that they should receive a response within 2 business days.   Please advise at Mobile [phone] (mobile)

## 2023-11-22 ENCOUNTER — Ambulatory Visit: Payer: Self-pay | Admitting: *Deleted

## 2023-11-22 NOTE — Telephone Encounter (Signed)
Medication was refilled on 11/19/20[facility] to [facility] [place].

## 2023-11-22 NOTE — Patient Outreach (Signed)
CSW spoke with pt and daughter today to confirm plans to meet tomorrow at PCP office. Both confirm plans to be there tomorrow and will meet with me prior to PCP office.   Reece Levy, MSW, LCSW [place], [facility] Licensed Clinical Social Worker Care Coordinator  [phone]

## 2023-11-23 ENCOUNTER — Ambulatory Visit: Payer: Self-pay | Admitting: *Deleted

## 2023-11-23 ENCOUNTER — Encounter: Payer: Self-pay | Admitting: Family Medicine

## 2023-11-23 ENCOUNTER — Telehealth: Payer: Self-pay

## 2023-11-23 ENCOUNTER — Ambulatory Visit: Payer: Self-pay | Admitting: Licensed Clinical Social Worker

## 2023-11-23 ENCOUNTER — Ambulatory Visit (INDEPENDENT_AMBULATORY_CARE_PROVIDER_SITE_OTHER): Payer: HMO | Admitting: Family Medicine

## 2023-11-23 VITALS — BP 134/78 | HR 68 | Temp 98.2°F | Wt 81.0 lb

## 2023-11-23 DIAGNOSIS — G4709 Other insomnia: Secondary | ICD-10-CM | POA: Diagnosis not present

## 2023-11-23 DIAGNOSIS — R64 Cachexia: Secondary | ICD-10-CM | POA: Diagnosis not present

## 2023-11-23 DIAGNOSIS — R636 Underweight: Secondary | ICD-10-CM | POA: Diagnosis not present

## 2023-11-23 DIAGNOSIS — Z681 Body mass index (BMI) 19 or less, adult: Secondary | ICD-10-CM

## 2023-11-23 MED ORDER — MIRTAZAPINE 7.5 MG PO TABS: 7.5000 mg | ORAL_TABLET | Freq: Every day | ORAL | 2 refills | Status: DC

## 2023-11-23 NOTE — Patient Outreach (Signed)
  Care Coordination   In Person Provider Office Visit Note   11/23/2023 Name: Lindsey Rowland MRN: [id number] DOB: June 13, 1951  Lindsey Rowland is a 72 y.o. year old female who sees Mliss Sax, MD for primary care. I engaged with Lindsey Rowland in the providers office today.  Also met with daughter Lindsey Rowland along with social work Reece Levy, LCSW and [employer],  [place].  Discussed patient malnutrition and caregiving for spouse.  Patient to see physician today as well.  New referral to be requested for nutritionist.   What matters to the patients health and wellness today?  Patient weight loss and loss of appetite      SDOH assessments and interventions completed:  No     Care Coordination Interventions:  Yes, provided   Follow up plan:  RN CM will follow up appropriate.      Encounter Outcome:  Patient Visit Completed   Bary Leriche, RN, MSN RN Care Manager [facility], Population Health Direct Dial: [phone]  Fax: [phone] Website: [email or web address]

## 2023-11-23 NOTE — Patient Instructions (Signed)
Visit Information  Thank you for taking time to visit with me today. Please don't hesitate to contact me if I can be of assistance to you.   Following are the goals we discussed today:   Goals Addressed             This Visit's Progress    Provide support, resources and assistance with care needs at home       Activities and task to complete in order to accomplish goals.   Keep all upcoming appointment discussed today Continue with compliance of taking medication prescribed by Doctor Consider the suggestions made for safety, home modifications, family meeting,etc Continue to work with [employer]  your HTA "PAPA PALS" caregiver hours before the end of year if needed for home companion/caregiver support Speak with palliative care provider about switching husband to hospice Contact senior resources about private sitter list Practice self care opportunities 2-3 times a week Referral to [employer] for home care Drink Premier Protein drinks 3 days a week Increase number of grocery items daughter will have delivered to home.        Our next appointment is by telephone on 12/14/2023 at 10:30AM  Please call the care guide team at [phone] if you need to cancel or reschedule your appointment.   If you are experiencing a Mental Health or Behavioral Health Crisis or need someone to talk to, please call the Suicide and Crisis Lifeline: 988  Patient verbalizes understanding of instructions and care plan provided today and agrees to view in MyChart. Active MyChart status and patient understanding of how to access instructions and care plan via MyChart confirmed with patient.     Telephone follow up appointment with care management team member scheduled for: 12/14/2023.

## 2023-11-23 NOTE — Progress Notes (Signed)
Established Patient Office Visit   Subjective:  Patient ID: Lindsey Rowland, female    DOB: 1951-02-12  Age: 72 y.o. MRN: [id number]  Chief Complaint  Patient presents with   Medical Management of Chronic Issues    3 month follow up . Follow up on MRI and Nutrition referral.     HPI Encounter Diagnoses  Name Primary?   Underweight (BMI < 18.5) Yes   Cachexia (HCC)    Other insomnia    Follow-up of above accompanied by her daughter Fritzi Mandes.  Nutritional status remains a concern.  Had been referred to nutritional counseling but later declined the consult.  Continues to live at home with her ailing husband and niece thanks I told them I was going to check the B12 but it is read.  Diagnosed recently with mild cognitive decline.  Today she had forgotten that I did met her daughter in the past.   Review of Systems  Constitutional: Negative.   HENT: Negative.    Eyes:  Negative for blurred vision, discharge and redness.  Respiratory: Negative.    Cardiovascular: Negative.   Gastrointestinal:  Negative for abdominal pain.  Genitourinary: Negative.   Musculoskeletal: Negative.  Negative for myalgias.  Skin:  Negative for rash.  Neurological:  Negative for tingling, loss of consciousness and weakness.  Endo/Heme/Allergies:  Negative for polydipsia.  Psychiatric/Behavioral:  Positive for memory loss. The patient has insomnia.      Current Outpatient Medications:    albuterol (VENTOLIN HFA) 108 (90 Base) MCG/ACT inhaler, Inhale 2 puffs into the lungs every 6 (six) hours as needed for wheezing or shortness of breath., Disp: 8 g, Rfl: 6   albuterol (VENTOLIN HFA) 108 (90 Base) MCG/ACT inhaler, 1 PUFF EVERY 8 HOURS AS NEEDED, Disp: 18 each, Rfl: 10   Aspirin-Salicylamide-Caffeine (BC HEADACHE POWDER PO), Take 1 packet by mouth daily as needed (for pain or headache). , Disp: , Rfl:    celecoxib (CELEBREX) 200 MG capsule, TAKE 1 CAPSULE BY MOUTH EVERY DAY AS NEEDED, Disp: 90 capsule, Rfl:  1   diphenoxylate-atropine (LOMOTIL) 2.5-0.025 MG tablet, TAKE 1 TABLET BY MOUTH 4 (FOUR) TIMES DAILY AS NEEDED FOR DIARRHEA OR LOOSE STOOLS., Disp: 120 tablet, Rfl: 2   donepezil (ARICEPT) 5 MG tablet, Take 1 tablet (5 mg total) by mouth daily., Disp: 30 tablet, Rfl: 11   mirtazapine (REMERON) 7.5 MG tablet, Take 1 tablet (7.5 mg total) by mouth at bedtime., Disp: 30 tablet, Rfl: 2   mometasone (NASONEX) 50 MCG/ACT nasal spray, PLACE 2 SPRAYS INTO THE NOSE DAILY. (Patient taking differently: Place 2 sprays into the nose daily. As needed), Disp: 51 each, Rfl: 4   Multiple Vitamin (MULITIVITAMIN WITH MINERALS) TABS, Take 1 tablet by mouth daily., Disp: , Rfl:    Polyethyl Glycol-Propyl Glycol (SYSTANE OP), Place 1 drop into both eyes daily as needed (for dry eyes)., Disp: , Rfl:    pregabalin (LYRICA) 25 MG capsule, TAKE 1 CAPSULE BY MOUTH AT BEDTIME., Disp: 30 capsule, Rfl: 2   Tiotropium Bromide-Olodaterol (STIOLTO RESPIMAT) 2.5-2.5 MCG/ACT AERS, INHALE 2 PUFFS BY MOUTH INTO THE LUNGS DAILY, Disp: 4 g, Rfl: 5   Objective:     BP 134/78   Pulse 68   Temp 98.2 F (36.8 C) (Temporal)   Wt 81 lb (36.7 kg)   SpO2 99%   BMI 14.82 kg/m  Wt Readings from Last 3 Encounters:  11/23/23 81 lb (36.7 kg)  09/20/23 82 lb (37.2 kg)  09/19/23 83 lb 9.6  oz (37.9 kg)      Physical Exam Constitutional:      General: She is not in acute distress.    Appearance: Normal appearance. She is underweight. She is not ill-appearing, toxic-appearing or diaphoretic.  HENT:     Head: Normocephalic and atraumatic.     Right Ear: External ear normal.     Left Ear: External ear normal.  Eyes:     General: No scleral icterus.       Right eye: No discharge.        Left eye: No discharge.     Extraocular Movements: Extraocular movements intact.     Conjunctiva/sclera: Conjunctivae normal.  Pulmonary:     Effort: Pulmonary effort is normal. No respiratory distress.  Skin:    General: Skin is warm and dry.   Neurological:     Mental Status: She is alert and oriented to person, place, and time.  Psychiatric:        Mood and Affect: Mood normal.        Behavior: Behavior normal.      No results found for any visits on 11/23/23.    The 10-year ASCVD risk score (Arnett DK, et al., 2019) is: 19.9%    Assessment & Plan:   Underweight (BMI < 18.5) -     Amb ref to Medical Nutrition Therapy-MNT  Cachexia (HCC) -     Amb ref to Medical Nutrition Therapy-MNT  Other insomnia -     Mirtazapine; Take 1 tablet (7.5 mg total) by mouth at bedtime.  Dispense: 30 tablet; Refill: 2    Return in about 3 months (around 02/21/2024), or if symptoms worsen or fail to improve.  Trial of mirtazapine at low-dose to help with sleep and hopefully stimulate appetite.  Continue 3 Ensure daily mixed with ice cream.  Referral back for nutritional counseling.  Mliss Sax, MD

## 2023-11-23 NOTE — Patient Outreach (Signed)
  Care Coordination   In Person Provider Office Visit Note   11/23/2023 Name: Lindsey Rowland MRN: [id number] DOB: [birth year]  Lindsey Rowland is a 72 y.o. year old female who sees Lindsey Sax, MD for primary care. I spoke with  Lindsey Rowland by phone today.  What matters to the patients health and wellness today?  I want to improve my quality of life.    Goals Addressed             This Visit's Progress    Provide support, resources and assistance with care needs at home       Activities and task to complete in order to accomplish goals.   Keep all upcoming appointment discussed today Continue with compliance of taking medication prescribed by Doctor Consider the suggestions made for safety, home modifications, family meeting,etc Continue to work with [employer]  your HTA "PAPA PALS" caregiver hours before the end of year if needed for home companion/caregiver support Speak with palliative care provider about switching husband to hospice Contact senior resources about private sitter list Practice self care opportunities 2-3 times a week Referral to [employer] for home care Drink Premier Protein drinks 3 days a week Increase number of grocery items daughter will have delivered to home.        SDOH assessments and interventions completed:  Yes  SDOH Interventions Today    Flowsheet Row Most Recent Value  SDOH Interventions   Food Insecurity Interventions Intervention Not Indicated  [Reports lack of appetite but is able to afford food as needed.]  Housing Interventions Intervention Not Indicated  Transportation Interventions Intervention Not Indicated  Utilities Interventions Intervention Not Indicated        Care Coordination Interventions:  Yes, provided   Interventions Today    Flowsheet Row Most Recent Value  Chronic Disease   Chronic disease during today's visit Other  [Cognitive Impairment]  General Interventions    General Interventions Discussed/Reviewed General Interventions Discussed, [employer]  [We discussed community resources to improve patient's quality of life. Encouraged patient/daughter to contact senior center for [employer] and Insurance underwriter. CSW will complete referral to [facility] [place].]  Exercise Interventions   Exercise Discussed/Reviewed Exercise Discussed, Exercise Reviewed  Lindsey Rowland is more active in the summer when she can get out in the yard and do work. Patient is less active in winter and has been running out of energy quickly - reports this is due to malnurishment.]  Mental Health Interventions   Mental Health Discussed/Reviewed Mental Health Discussed, Mental Health Reviewed, Coping Strategies, Depression  [Patient denied deressive mood but scored a 7 on PHQ-9, reviewed patients coping skills including - taking baths, games on i-pad and visiting with family. Encouraged to pursue these more often to help with mood - emphasized selfcare.]  Nutrition Interventions   Nutrition Discussed/Reviewed Nutrition Discussed, Nutrition Reviewed  [Patient reports low appetite, patient was previously drinking Premeir Protein but ran out of this. Daughter will reorder the supplement and start having it delivered. Encouraged family to speak with PCP about nutrition referral.]  Advanced Directive Interventions   Advanced Directives Discussed/Reviewed Advanced Directives Discussed, Advanced Directives Reviewed  [Patient has healthcare POA -Lindsey Rowland and financial POA - Lindsey Rowland.]        Follow up plan: Follow up call scheduled for 12/14/2023    Encounter Outcome:  Patient Visit Completed

## 2023-11-23 NOTE — Patient Instructions (Signed)
Visit Information  Thank you for taking time to visit with me today. Please don't hesitate to contact me if I can be of assistance to you.   If you are experiencing a Mental Health or Behavioral Health Crisis or need someone to talk to, please call the Suicide and Crisis Lifeline: 988   Patient verbalizes understanding of instructions and care plan provided today and agrees to view in MyChart. Active MyChart status and patient understanding of how to access instructions and care plan via MyChart confirmed with patient.     The patient has been provided with contact information for the care management team and has been advised to call with any health related questions or concerns.   Bary Leriche, RN, MSN RN Care Manager [facility], Population Health Direct Dial: [phone]  Fax: [phone] Website: [email or web address]

## 2023-11-24 ENCOUNTER — Telehealth: Payer: Self-pay | Admitting: Family Medicine

## 2023-11-24 NOTE — Telephone Encounter (Signed)
Copied from CRM [id number]. Topic: Referral - Question >> Nov 24, 2023 11:00 AM Adaysia C wrote: Reason for CRM: Patients daughter(Kersten Bost) called about a referral made to the nutritionist for the PT. The PT's daughter(Kersten Bost) would like to inform the provider that the patient can not afford the $500 nutrisonist visits and ask if the provider can recommend an alternative like a feeding tube or another alternative. Please call PT's daughter(Kersten Bost) back to follow up [phone].

## 2023-11-24 NOTE — Patient Outreach (Signed)
error 

## 2023-11-28 ENCOUNTER — Telehealth: Payer: Self-pay | Admitting: Licensed Clinical Social Worker

## 2023-11-28 ENCOUNTER — Ambulatory Visit: Payer: Self-pay | Admitting: Licensed Clinical Social Worker

## 2023-11-28 ENCOUNTER — Telehealth: Payer: Self-pay | Admitting: *Deleted

## 2023-11-28 NOTE — Patient Outreach (Signed)
  Care Coordination   Follow Up Visit Note   11/28/2023 Name: Lindsey Rowland MRN: [id number] DOB: 1951/10/31  Lindsey Rowland is a 72 y.o. year old female who sees Mliss Sax, MD for primary care. I  spoke with daughter Lindsey Rowland over the phone.  What matters to the patients health and wellness today?  Daughter's concerns for patient's dementia.    Goals Addressed   None     SDOH assessments and interventions completed:  Yes     Care Coordination Interventions:  Yes, provided   Follow up plan: Follow up call scheduled for 12/14/2023    Encounter Outcome:  Patient Visit Completed   Kenton Kingfisher, LCSW [place], [facility] Licensed Clinical Social Worker Care Coordinator [phone]

## 2023-11-28 NOTE — Patient Outreach (Signed)
  Care Coordination   11/28/2023 Name: PEPPER GRAMLING MRN: [id number] DOB: 1950/12/17   Care Coordination Outreach Attempts:  An unsuccessful telephone outreach was attempted today to offer the patient information about available complex care management services.  Follow Up Plan:  Additional outreach attempts will be made to offer the patient complex care management information and services.   Encounter Outcome:  No Answer   Care Coordination Interventions:  No, not indicated    Kenton Kingfisher, LCSW [place], [facility] Licensed Clinical Social Worker Care Coordinator [phone]

## 2023-11-28 NOTE — Patient Outreach (Signed)
  Care Coordination   11/28/2023 Name: Lindsey Rowland MRN: [id number] DOB: 09-16-1951   Care Coordination Outreach Attempts:  An unsuccessful telephone outreach was attempted today to offer the patient information about available complex care management services. Attempted to return daughter, Avon Gully, phone call requesting callback.  Follow Up Plan:  Additional outreach attempts will be made to offer the patient complex care management information and services.   Encounter Outcome:  No Answer   Care Coordination Interventions:  No, not indicated    Reece Levy, MSW, LCSW [facility], [facility] Licensed Clinical Social Worker Care Coordinator  [phone]

## 2023-12-14 ENCOUNTER — Ambulatory Visit: Payer: Self-pay | Admitting: Licensed Clinical Social Worker

## 2023-12-14 NOTE — Patient Outreach (Signed)
  Care Coordination   Follow Up Visit Note   12/14/2023 Name: KARLEA MCKIBBIN MRN: [id number] DOB: 1951/09/30  FERNE ELLINGWOOD is a 73 y.o. year old female who sees Berneta Elsie Sayre, MD for primary care. I spoke with  Heron MARLA Birmingham by phone today.  What matters to the patients health and wellness today?  Maintaining independence and the amount of help pt receives in the home.    Goals Addressed             This Visit's Progress    Provide support, resources and assistance with care needs at home       Activities and task to complete in order to accomplish goals.   Keep all upcoming appointment discussed today Continue with compliance of taking medication prescribed by Doctor Consider the suggestions made for safety, home modifications, family meeting,etc Continue to work with [employer] Utilize your [facility] [facility] [facility] caregiver hours before the end of year if needed for home companion/caregiver support Speak with palliative care provider about switching husband to hospice Contact senior resources about private sitter list Practice self care opportunities 2-3 times a week  Referral to [facility] for home care - still on waiting list Drink Premier Protein drink - reports she drinks two a day and one big meal at the end of the day Increase number of grocery items daughter will have delivered to home        SDOH assessments and interventions completed:  Yes     Care Coordination Interventions:  Yes, provided  Interventions Today    Flowsheet Row Most Recent Value  Chronic Disease   Chronic disease during today's visit Other  [Care Giver Stress/Weight loss]  General Interventions   General Interventions Discussed/Reviewed [facility] has not heard from [facility] but is still on wait list to get extra hours of sitter services. CSW reminded pt of private sitter list at senior center. Pt reported she is managing caregiver  stress well at this time.]  Education Interventions   Education Provided Provided Education  [Reminded pt of upcoming neurology appt on 1/16 - pt's dtr is planning on attending.]  Mental Health Interventions   Mental Health Discussed/Reviewed Mental Health Discussed, Mental Health Reviewed  Nutrition Interventions   Nutrition Discussed/Reviewed Nutrition Discussed, Nutrition Reviewed  [Pt reports drinking two protein drinks a day - having snacks throughout day and one big meal at night. CSW encouraged pt to continue drinking protein drinks and to increase meals throughout day.]       Follow up plan: Follow up call scheduled for 01/02/24    Encounter Outcome:  Patient Visit Completed   Alm Armor, LCSW [facility], [facility] Licensed Clinical Social Worker Care Coordinator [phone]

## 2023-12-14 NOTE — Patient Instructions (Signed)
 Visit Information  Thank you for taking time to visit with me today. Please don't hesitate to contact me if I can be of assistance to you.   Following are the goals we discussed today:   Goals Addressed             This Visit's Progress    Provide support, resources and assistance with care needs at home       Activities and task to complete in order to accomplish goals.   Keep all upcoming appointment discussed today Continue with compliance of taking medication prescribed by Doctor Consider the suggestions made for safety, home modifications, family meeting,etc Continue to work with [employer] Utilize your [employer] caregiver hours before the end of year if needed for home companion/caregiver support Speak with palliative care provider about switching husband to hospice Contact senior resources about private sitter list Practice self care opportunities 2-3 times a week  Referral to [facility] for home care - still on waiting list Drink Premier Protein drink - reports she drinks two a day and one big meal at the end of the day Increase number of grocery items daughter will have delivered to home        Our next appointment is by telephone on 01/02/2024   Please call the care guide team at [phone] if you need to cancel or reschedule your appointment.   If you are experiencing a Mental Health or Behavioral Health Crisis or need someone to talk to, please call the Suicide and Crisis Lifeline: 988  Patient verbalizes understanding of instructions and care plan provided today and agrees to view in MyChart. Active MyChart status and patient understanding of how to access instructions and care plan via MyChart confirmed with patient.     Telephone follow up appointment with care management team member scheduled for:01/02/24

## 2023-12-16 ENCOUNTER — Other Ambulatory Visit: Payer: Self-pay | Admitting: Family Medicine

## 2023-12-16 DIAGNOSIS — G4709 Other insomnia: Secondary | ICD-10-CM

## 2023-12-21 ENCOUNTER — Ambulatory Visit: Payer: HMO | Admitting: Physician Assistant

## 2024-01-08 ENCOUNTER — Ambulatory Visit: Payer: HMO | Admitting: Physician Assistant

## 2024-01-08 ENCOUNTER — Encounter: Payer: Self-pay | Admitting: Physician Assistant

## 2024-01-08 VITALS — BP 97/63 | Ht 62.0 in | Wt 80.0 lb

## 2024-01-08 DIAGNOSIS — G3184 Mild cognitive impairment, so stated: Secondary | ICD-10-CM

## 2024-01-08 DIAGNOSIS — Z789 Other specified health status: Secondary | ICD-10-CM

## 2024-01-08 MED ORDER — DIVALPROEX SODIUM 125 MG PO DR TAB: 125.0000 mg | DELAYED_RELEASE_TABLET | Freq: Every day | ORAL | 11 refills | Status: DC

## 2024-01-08 NOTE — Progress Notes (Addendum)
 Assessment/Plan:   Mild cognitive impairment with memory loss Alcohol abuse  Lindsey Rowland is a very pleasant 73 y.o. RH female with a history of NSCLCa s/p XRT R lung, COPD, Tobacco abuse, ETOH habituation, prior asbestos exposure, possible depression per daughter's report, arthritis, polio as a child and a diagnosis of mild cognitive impairment  per neuropsychological evaluation seen today in follow up for memory loss. Patient is currently on donepezil  5 mg daily. Unfortunately, her behavior is hindering the cognitive therapeutic goals. She has moments of agitation not well controlled. Her PCP Started her on mirtazapine  for sleep, mood and to appetite. Sundowning episodes are more frequent. Discussed starting Depakote  125 mg at night, may be increased to bid, daughter would like to try.  Patient also continues to drive and discussed with her daughter the need to stop driving in view of her behavioral changes, along with her alcohol consumption.  Daughter may be "take the keys from her "for the safety of her son of others.    Follow up in 6 months. Continue donepezil  to 5 mg daily, side effects discussed, may increase dose during the next visit, during this visit we need to concentrate on her mood issues.  Start Depakote  125  mg Take 1 tab at night , may increase to 1 tab twice a day if needed, side effects discussed. Repeat neuropsych evaluation in 10 to 16 months for diagnostic clarity and disease trajectory Tobacco and alcohol cessation counseled Recommend BH for possible depression, agitation, anxiety, and other mood issues Recommend good control of her cardiovascular risk factors Continue to control mood as per PCP Recommend no further driving    Subjective:    This patient is accompanied in the office by her daughter who lives in [place]  who supplements the history.  Previous records as well as any outside records available were reviewed prior to todays visit. Patient was  last seen on 09/20/23 with [facility] 15/30     Any changes in memory since last visit? "  The medicine is not working ".  She continues to have some difficulty remembering new information, recent conversations, names.  LTM is fair.  "She may get upset more easily which makes her memory worse ".  Patient denies that there is any memory change.  Likes to do puzzles. repeats oneself?  Endorsed, more often than before according to daughter Disoriented when walking into a room?  Patient denies    Leaving objects?  May misplace things and courts more than prior. Wandering behavior?  denies   Any personality changes since last visit?  "Sometimes I do get agitated when I can't find where I am looking for", she is more verbally aggressive "unlike her ".  Daughter reports that she has sundowning tendencies.  She also calls people randomly " Any worsening depression?:  Denies.  As before, she gets upset easily, worried that there "attacking her ", because of her memory. Hallucinations or paranoia?  Denies.   Sleep: Goes to early and wakes up later.   Seizures? denies    Any sleep changes?  "She goes to sleep early and wakes up later "-daughter states.   Recently started on mirtazapine .  Denies vivid dreams, or sleepwalking.  She has RLS, takes Lyrica  . Sleep apnea?   Denies.   Any hygiene concerns? Denies.  Independent of bathing and dressing?  Endorsed  Does the patient needs help with medications?  Patient is in charge   Who is in charge of the finances?  Patient is in charge      Any changes in appetite?  Decreased, as before.  Recently was started on Remeron . Patient have trouble swallowing? Denies.   Does the patient cook? No, she has not been cooking, and pulmonary concerned that she is not feeding her father who is sick. Any headaches?   denies   Chronic back pain  denies   Ambulates with difficulty? Denies.    Recent falls or head injuries? denies     Unilateral weakness, numbness or tingling?  denies   Any tremors?  Denies   Any anosmia?  Denies   Any incontinence of urine?  Endorsed, wears diapers Any bowel dysfunction?   Denies      Patient lives with her husband who is disabled.  1 daughter lives next door.  She has 1 niece that is very involved in her care Does the patient drive?  Yes, her daughter is very concerned about her because she drives her dog around town 3 or 4 times a day and when approached about it, she gets very combative about driving.  She denies getting lost, and daughter is "not very sure about it ".    Alcohol?  She drinks  3 large beers a day according to daughter, patient denies. Tobacco?  Smokes about 2 cigarettes a day now  Psychological evaluation 10/13/2023 Briefly, results suggested severe impairment surrounding cognitive flexibility and both encoding (i.e., learning) and delayed retrieval aspects of memory. A relative weakness was exhibited across processing speed while additional performance variability was exhibited across semantic fluency, visuospatial abilities, and recognition/consolidation aspects of memory. Regarding etiology, testing patterns do raise concern for an underlying neurodegenerative illness, namely Alzheimer's disease. Across memory testing, Ms. Giarrusso did not benefit from repeated exposure to novel information, exhibiting flat learning curves. After a brief delay, retention rates were 0% and 16% across list and figure tasks respectively. While her retention rate was 50% for a story task, this amounted to her recalling only two details, suggesting ongoing impairment. Finally, performances across yes/no recognition trials were variable but below expectation overall. Taken together, this suggests evidence for rapid forgetting and an evolving and already fairly significant storage impairment, both of which are the hallmark testing patterns for this illness. Further weakness/variability across semantic fluency and cognitive flexibility would follow  typical disease progression. Intact confrontation naming is encouraging in this regard. However, concerns for this illness remain. Elevated alcohol use/abuse may exacerbate cognitive impairment and could put her at a greater risk for an accelerated decline in the future. Continued medical monitoring will be important moving forward.   PREVIOUS MEDICATIONS:   CURRENT MEDICATIONS:  Outpatient Encounter Medications as of 01/08/2024  Medication Sig   albuterol  (VENTOLIN  HFA) 108 (90 Base) MCG/ACT inhaler Inhale 2 puffs into the lungs every 6 (six) hours as needed for wheezing or shortness of breath.   albuterol  (VENTOLIN  HFA) 108 (90 Base) MCG/ACT inhaler 1 PUFF EVERY 8 HOURS AS NEEDED   Aspirin-Salicylamide-Caffeine (BC HEADACHE POWDER PO) Take 1 packet by mouth daily as needed (for pain or headache).    celecoxib  (CELEBREX ) 200 MG capsule TAKE 1 CAPSULE BY MOUTH EVERY DAY AS NEEDED   diphenoxylate -atropine  (LOMOTIL ) 2.5-0.025 MG tablet TAKE 1 TABLET BY MOUTH 4 (FOUR) TIMES DAILY AS NEEDED FOR DIARRHEA OR LOOSE STOOLS.   divalproex  (DEPAKOTE ) 125 MG DR tablet Take 1 tablet (125 mg total) by mouth at bedtime.   donepezil  (ARICEPT ) 5 MG tablet Take 1 tablet (5 mg total) by mouth daily.  mirtazapine  (REMERON ) 7.5 MG tablet TAKE 1 TABLET BY MOUTH AT BEDTIME.   mometasone  (NASONEX ) 50 MCG/ACT nasal spray PLACE 2 SPRAYS INTO THE NOSE DAILY. (Patient taking differently: Place 2 sprays into the nose daily. As needed)   Multiple Vitamin (MULITIVITAMIN WITH MINERALS) TABS Take 1 tablet by mouth daily.   Polyethyl Glycol-Propyl Glycol (SYSTANE OP) Place 1 drop into both eyes daily as needed (for dry eyes).   pregabalin  (LYRICA ) 25 MG capsule TAKE 1 CAPSULE BY MOUTH AT BEDTIME.   Tiotropium Bromide -Olodaterol (STIOLTO RESPIMAT ) 2.5-2.5 MCG/ACT AERS INHALE 2 PUFFS BY MOUTH INTO THE LUNGS DAILY   No facility-administered encounter medications on file as of 01/08/2024.        No data to display             09/20/2023   10:00 AM  Montreal Cognitive Assessment   Visuospatial/ Executive (0/5) 4  Naming (0/3) 3  Attention: Read list of digits (0/2) 2  Attention: Read list of letters (0/1) 1  Attention: Serial 7 subtraction starting at 100 (0/3) 0  Language: Repeat phrase (0/2) 0  Language : Fluency (0/1) 1  Abstraction (0/2) 0  Delayed Recall (0/5) 0  Orientation (0/6) 4  Total 15  Adjusted Score (based on education) 15    Objective:     PHYSICAL EXAMINATION:    VITALS:   Vitals:   01/08/24 1133  BP: 97/63  SpO2: 95%  Weight: 80 lb (36.3 kg)  Height: 5\' 2"  (1.575 m)    GEN:  The patient appears stated age and is in NAD. HEENT:  Normocephalic, atraumatic.   Neurological examination:  General: NAD, well-groomed, appears stated age. Orientation: The patient is alert. Oriented to person, place and date Cranial nerves: There is good facial symmetry.The speech is fluent and clear. No aphasia or dysarthria. Fund of knowledge is reduced.. Recent and remote memory are impaired. Attention and concentration are reduced.  Able to name objects and repeat phrases.  Hearing is intact to conversational tone.   Sensation: Sensation is intact to light touch throughout Motor: Strength is at least antigravity x4. DTR's 2/4 in UE/LE     Movement examination: Tone: There is normal tone in the UE/LE Abnormal movements:  no tremor.  No myoclonus.  No asterixis.   Coordination:  There is no decremation with RAM's. Normal finger to nose  Gait and Station: The patient has no  difficulty arising out of a deep-seated chair without the use of the hands. The patient's stride length is good.  Gait is cautious and narrow.  She has chronic limb due to prior polio as a child.   Thank you for allowing us  the opportunity to participate in the care of this nice patient. Please do not hesitate to contact us  for any questions or concerns.   Total time spent on today's visit was 43 minutes dedicated to this  patient today, preparing to see patient, examining the patient, ordering tests and/or medications and counseling the patient, documenting clinical information in the EHR or other health record, independently interpreting results and communicating results to the patient/family, discussing treatment and goals, answering patient's questions and coordinating care.  Cc:  Tonna Frederic, MD  Tex Filbert 01/08/2024 12:39 PM

## 2024-01-08 NOTE — Patient Instructions (Addendum)
It was a pleasure to see you today at our office.   Recommendations:    Discontinue Alcohol  Start  Depakote  125 mg Take 1 tab at night , may increase to 1 tab twice a day if needed  Continue Donepezil 5 mg daily   Follow up Fri 15 August at 11:30   Quit  alcohol and recommend discontinuing tobacco    For psychiatric meds, mood meds: Please have your primary care physician manage these medications.  If you have any severe symptoms of a stroke, or other severe issues such as confusion,severe chills or fever, etc call 911 or go to the ER as you may need to be evaluated further   For guidance regarding [facility] and if placement were needed at the facility, contact Social Worker tel: [phone]  For assessment of decision of mental capacity and competency:  Call Dr. Erick Blinks, geriatric psychiatrist at [phone]  Counseling regarding caregiver distress, including caregiver depression, anxiety and issues regarding community resources, adult day care programs, adult living facilities, or memory care questions:  please contact your  Primary Doctor's Social Worker   Whom to call: Memory  decline, memory medications: Call our office [phone]    [email or web address]   RECOMMENDATIONS FOR ALL PATIENTS WITH MEMORY PROBLEMS: 1. Continue to exercise (Recommend 30 minutes of walking everyday, or 3 hours every week) 2. Increase social interactions - continue going to [place] and enjoy social gatherings with friends and family 3. Eat healthy, avoid fried foods and eat more fruits and vegetables 4. Maintain adequate blood pressure, blood sugar, and blood cholesterol level. Reducing the risk of stroke and cardiovascular disease also helps promoting better memory. 5. Avoid stressful situations. Live a simple life and avoid aggravations. Organize your time and prepare for the next day in anticipation. 6. Sleep well,  avoid any interruptions of sleep and avoid any distractions in the bedroom that may interfere with adequate sleep quality 7. Avoid sugar, avoid sweets as there is a strong link between excessive sugar intake, diabetes, and cognitive impairment We discussed the Mediterranean diet, which has been shown to help patients reduce the risk of progressive memory disorders and reduces cardiovascular risk. This includes eating fish, eat fruits and green leafy vegetables, nuts like almonds and hazelnuts, walnuts, and also use olive oil. Avoid fast foods and fried foods as much as possible. Avoid sweets and sugar as sugar use has been linked to worsening of memory function.  There is always a concern of gradual progression of memory problems. If this is the case, then we may need to adjust level of care according to patient needs. Support, both to the patient and caregiver, should then be put into place.      You have been referred for a neuropsychological evaluation (i.e., evaluation of memory and thinking abilities). Please bring someone with you to this appointment if possible, as it is helpful for the doctor to hear from both you and another adult who knows you well. Please bring eyeglasses and hearing aids if you wear them.    The evaluation will take approximately 3 hours and has two parts:   The first part is a clinical interview with the neuropsychologist (Dr. Milbert Coulter or Dr. Roseanne Reno). During the interview, the neuropsychologist will speak with you and the individual you brought to the appointment.    The second part of the evaluation is testing with the doctor's technician Annabelle Harman or Selena Batten). During the testing, the technician will ask you to  remember different types of material, solve problems, and answer some questionnaires. Your family member will not be present for this portion of the evaluation.   Please note: We must reserve several hours of the neuropsychologist's time and the psychometrician's time for  your evaluation appointment. As such, there is a No-Show fee of $100. If you are unable to attend any of your appointments, please contact our office as soon as possible to reschedule.      DRIVING: Regarding driving, in patients with progressive memory problems, driving will be impaired. We advise to have someone else do the driving if trouble finding directions or if minor accidents are reported. Independent driving assessment is available to determine safety of driving.   If you are interested in the driving assessment, you can contact the following:  [employer] in [place] [phone]  [facility] [phone]  [facility] [phone]  [facility] [phone] or [phone]   FALL PRECAUTIONS: Be cautious when walking. Scan the area for obstacles that may increase the risk of trips and falls. When getting up in the mornings, sit up at the edge of the bed for a few minutes before getting out of bed. Consider elevating the bed at the head end to avoid drop of blood pressure when getting up. Walk always in a well-lit room (use night lights in the walls). Avoid area rugs or power cords from appliances in the middle of the walkways. Use a walker or a cane if necessary and consider physical therapy for balance exercise. Get your eyesight checked regularly.  FINANCIAL OVERSIGHT: Supervision, especially oversight when making financial decisions or transactions is also recommended.  HOME SAFETY: Consider the safety of the kitchen when operating appliances like stoves, microwave oven, and blender. Consider having supervision and share cooking responsibilities until no longer able to participate in those. Accidents with firearms and other hazards in the house should be identified and addressed as well.   ABILITY TO BE LEFT ALONE: If patient is unable to contact 911 operator, consider using LifeLine, or when the need is there, arrange for  someone to stay with patients. Smoking is a fire hazard, consider supervision or cessation. Risk of wandering should be assessed by caregiver and if detected at any point, supervision and safe proof recommendations should be instituted.  MEDICATION SUPERVISION: Inability to self-administer medication needs to be constantly addressed. Implement a mechanism to ensure safe administration of the medications.      Mediterranean Diet A Mediterranean diet refers to food and lifestyle choices that are based on the traditions of countries located on the [place]. This way of eating has been shown to help prevent certain conditions and improve outcomes for people who have chronic diseases, like kidney disease and heart disease. What are tips for following this plan? Lifestyle  Cook and eat meals together with your family, when possible. Drink enough fluid to keep your urine clear or pale yellow. Be physically active every day. This includes: Aerobic exercise like running or swimming. Leisure activities like gardening, walking, or housework. Get 7-8 hours of sleep each night. If recommended by your health care provider, drink red wine in moderation. This means 1 glass a day for nonpregnant women and 2 glasses a day for men. A glass of wine equals 5 oz (150 mL). Reading food labels  Check the serving size of packaged foods. For foods such as rice and pasta, the serving size refers to the amount of cooked product, not dry. Check the  total fat in packaged foods. Avoid foods that have saturated fat or trans fats. Check the ingredients list for added sugars, such as corn syrup. Shopping  At the grocery store, buy most of your food from the areas near the walls of the store. This includes: Fresh fruits and vegetables (produce). Grains, beans, nuts, and seeds. Some of these may be available in unpackaged forms or large amounts (in bulk). Fresh seafood. Poultry and eggs. Low-fat dairy  products. Buy whole ingredients instead of prepackaged foods. Buy fresh fruits and vegetables in-season from local farmers markets. Buy frozen fruits and vegetables in resealable bags. If you do not have access to quality fresh seafood, buy precooked frozen shrimp or canned fish, such as tuna, salmon, or sardines. Buy small amounts of raw or cooked vegetables, salads, or olives from the deli or salad bar at your store. Stock your pantry so you always have certain foods on hand, such as olive oil, canned tuna, canned tomatoes, rice, pasta, and beans. Cooking  Cook foods with extra-virgin olive oil instead of using butter or other vegetable oils. Have meat as a side dish, and have vegetables or grains as your main dish. This means having meat in small portions or adding small amounts of meat to foods like pasta or stew. Use beans or vegetables instead of meat in common dishes like chili or lasagna. Experiment with different cooking methods. Try roasting or broiling vegetables instead of steaming or sauteing them. Add frozen vegetables to soups, stews, pasta, or rice. Add nuts or seeds for added healthy fat at each meal. You can add these to yogurt, salads, or vegetable dishes. Marinate fish or vegetables using olive oil, lemon juice, garlic, and fresh herbs. Meal planning  Plan to eat 1 vegetarian meal one day each week. Try to work up to 2 vegetarian meals, if possible. Eat seafood 2 or more times a week. Have healthy snacks readily available, such as: Vegetable sticks with hummus. Greek yogurt. Fruit and nut trail mix. Eat balanced meals throughout the week. This includes: Fruit: 2-3 servings a day Vegetables: 4-5 servings a day Low-fat dairy: 2 servings a day Fish, poultry, or lean meat: 1 serving a day Beans and legumes: 2 or more servings a week Nuts and seeds: 1-2 servings a day Whole grains: 6-8 servings a day Extra-virgin olive oil: 3-4 servings a day Limit red meat and sweets  to only a few servings a month What are my food choices? Mediterranean diet Recommended Grains: Whole-grain pasta. Brown rice. Bulgar wheat. Polenta. Couscous. Whole-wheat bread. Orpah Cobb. Vegetables: Artichokes. Beets. Broccoli. Cabbage. Carrots. Eggplant. Green beans. Chard. Kale. Spinach. Onions. Leeks. Peas. Squash. Tomatoes. Peppers. Radishes. Fruits: Apples. Apricots. Avocado. Berries. Bananas. Cherries. Dates. Figs. Grapes. Lemons. Melon. Oranges. Peaches. Plums. Pomegranate. Meats and other protein foods: Beans. Almonds. Sunflower seeds. Pine nuts. Peanuts. Cod. Salmon. Scallops. Shrimp. Tuna. Tilapia. Clams. Oysters. Eggs. Dairy: Low-fat milk. Cheese. Greek yogurt. Beverages: Water. Red wine. Herbal tea. Fats and oils: Extra virgin olive oil. Avocado oil. Grape seed oil. Sweets and desserts: [place] yogurt with honey. Baked apples. Poached pears. Trail mix. Seasoning and other foods: Basil. Cilantro. Coriander. Cumin. Mint. Parsley. Sage. Rosemary. Tarragon. Garlic. Oregano. Thyme. Pepper. Balsalmic vinegar. Tahini. Hummus. Tomato sauce. Olives. Mushrooms. Limit these Grains: Prepackaged pasta or rice dishes. Prepackaged cereal with added sugar. Vegetables: Deep fried potatoes (french fries). Fruits: Fruit canned in syrup. Meats and other protein foods: Beef. Pork. Lamb. Poultry with skin. Hot dogs. Tomasa Blase. Dairy: Ice cream. Sour cream.  Whole milk. Beverages: Juice. Sugar-sweetened soft drinks. Beer. Liquor and spirits. Fats and oils: Butter. [place] oil. Vegetable oil. Beef fat (tallow). Lard. Sweets and desserts: Cookies. Cakes. Pies. Candy. Seasoning and other foods: Mayonnaise. Premade sauces and marinades. The items listed may not be a complete list. Talk with your dietitian about what dietary choices are right for you. Summary The Mediterranean diet includes both food and lifestyle choices. Eat a variety of fresh fruits and vegetables, beans, nuts, seeds, and whole  grains. Limit the amount of red meat and sweets that you eat. Talk with your health care provider about whether it is safe for you to drink red wine in moderation. This means 1 glass a day for nonpregnant women and 2 glasses a day for men. A glass of wine equals 5 oz (150 mL). This information is not intended to replace advice given to you by your health care provider. Make sure you discuss any questions you have with your health care provider. Document Released: 07/14/2016 Document Revised: 08/16/2016 Document Reviewed: 07/14/2016 Elsevier Interactive Patient Education  2017 [employer].

## 2024-02-04 ENCOUNTER — Other Ambulatory Visit: Payer: Self-pay | Admitting: Family Medicine

## 2024-02-04 DIAGNOSIS — J309 Allergic rhinitis, unspecified: Secondary | ICD-10-CM

## 2024-02-21 DIAGNOSIS — Z681 Body mass index (BMI) 19 or less, adult: Secondary | ICD-10-CM | POA: Diagnosis not present

## 2024-02-21 DIAGNOSIS — R509 Fever, unspecified: Secondary | ICD-10-CM | POA: Diagnosis not present

## 2024-02-21 DIAGNOSIS — R051 Acute cough: Secondary | ICD-10-CM | POA: Diagnosis not present

## 2024-02-21 DIAGNOSIS — Z79899 Other long term (current) drug therapy: Secondary | ICD-10-CM | POA: Diagnosis not present

## 2024-02-21 DIAGNOSIS — F17210 Nicotine dependence, cigarettes, uncomplicated: Secondary | ICD-10-CM | POA: Diagnosis not present

## 2024-02-21 DIAGNOSIS — R627 Adult failure to thrive: Secondary | ICD-10-CM | POA: Diagnosis not present

## 2024-02-21 DIAGNOSIS — J441 Chronic obstructive pulmonary disease with (acute) exacerbation: Secondary | ICD-10-CM | POA: Diagnosis not present

## 2024-02-21 DIAGNOSIS — C3490 Malignant neoplasm of unspecified part of unspecified bronchus or lung: Secondary | ICD-10-CM | POA: Diagnosis not present

## 2024-02-21 DIAGNOSIS — J9601 Acute respiratory failure with hypoxia: Secondary | ICD-10-CM | POA: Diagnosis not present

## 2024-02-21 DIAGNOSIS — R0602 Shortness of breath: Secondary | ICD-10-CM | POA: Diagnosis not present

## 2024-02-21 DIAGNOSIS — R059 Cough, unspecified: Secondary | ICD-10-CM | POA: Diagnosis not present

## 2024-02-21 DIAGNOSIS — I700 Atherosclerosis of aorta: Secondary | ICD-10-CM | POA: Diagnosis not present

## 2024-02-21 DIAGNOSIS — Z85118 Personal history of other malignant neoplasm of bronchus and lung: Secondary | ICD-10-CM | POA: Diagnosis not present

## 2024-02-21 DIAGNOSIS — K219 Gastro-esophageal reflux disease without esophagitis: Secondary | ICD-10-CM | POA: Diagnosis not present

## 2024-02-21 DIAGNOSIS — J449 Chronic obstructive pulmonary disease, unspecified: Secondary | ICD-10-CM | POA: Diagnosis not present

## 2024-02-21 DIAGNOSIS — J439 Emphysema, unspecified: Secondary | ICD-10-CM | POA: Diagnosis not present

## 2024-02-21 DIAGNOSIS — Z923 Personal history of irradiation: Secondary | ICD-10-CM | POA: Diagnosis not present

## 2024-02-21 DIAGNOSIS — R0902 Hypoxemia: Secondary | ICD-10-CM | POA: Diagnosis not present

## 2024-02-22 DIAGNOSIS — J441 Chronic obstructive pulmonary disease with (acute) exacerbation: Secondary | ICD-10-CM | POA: Diagnosis not present

## 2024-02-29 ENCOUNTER — Ambulatory Visit (INDEPENDENT_AMBULATORY_CARE_PROVIDER_SITE_OTHER): Payer: HMO | Admitting: Family Medicine

## 2024-02-29 ENCOUNTER — Encounter: Payer: Self-pay | Admitting: Family Medicine

## 2024-02-29 VITALS — BP 118/64 | HR 71 | Temp 97.9°F | Ht 62.0 in | Wt 85.4 lb

## 2024-02-29 DIAGNOSIS — G3184 Mild cognitive impairment, so stated: Secondary | ICD-10-CM | POA: Diagnosis not present

## 2024-02-29 DIAGNOSIS — Z789 Other specified health status: Secondary | ICD-10-CM | POA: Diagnosis not present

## 2024-02-29 DIAGNOSIS — G4709 Other insomnia: Secondary | ICD-10-CM | POA: Diagnosis not present

## 2024-02-29 DIAGNOSIS — F17200 Nicotine dependence, unspecified, uncomplicated: Secondary | ICD-10-CM

## 2024-02-29 DIAGNOSIS — Z681 Body mass index (BMI) 19 or less, adult: Secondary | ICD-10-CM | POA: Diagnosis not present

## 2024-02-29 DIAGNOSIS — R636 Underweight: Secondary | ICD-10-CM

## 2024-02-29 NOTE — Progress Notes (Signed)
 Established Patient Office Visit   Subjective:  Patient ID: Lindsey Rowland, female    DOB: [birth year]  Age: 73 y.o. MRN: [id number]  Chief Complaint  Patient presents with   Medical Management of Chronic Issues    3 month follow up. Pt is fasting.      HPI Encounter Diagnoses  Name Primary?   Underweight (BMI < 18.5) Yes   Other insomnia    CIGARETTE SMOKER    Alcohol use    Mild cognitive impairment with concerns for Alzheimer's disease    For follow-up today.  Recent overnight hospitalization for hypoxia with COPD exacerbation.  She is accompanied by her other daughter who lives next to her, Adella Nissen. Adella Nissen has taken a leave of absence to help care for her mother and father.  Continues to smoke and is not interested in quitting.  Continues to drink alcohol and has not interested in stopping.  She never started the Remeron.   Review of Systems  Constitutional: Negative.   HENT: Negative.    Eyes:  Negative for blurred vision, discharge and redness.  Respiratory: Negative.    Cardiovascular: Negative.   Gastrointestinal:  Negative for abdominal pain.  Genitourinary: Negative.   Musculoskeletal: Negative.  Negative for myalgias.  Skin:  Negative for rash.  Neurological:  Negative for tingling, loss of consciousness and weakness.  Endo/Heme/Allergies:  Negative for polydipsia.     Current Outpatient Medications:    albuterol (VENTOLIN HFA) 108 (90 Base) MCG/ACT inhaler, Inhale 2 puffs into the lungs every 6 (six) hours as needed for wheezing or shortness of breath., Disp: 8 g, Rfl: 6   albuterol (VENTOLIN HFA) 108 (90 Base) MCG/ACT inhaler, 1 PUFF EVERY 8 HOURS AS NEEDED, Disp: 18 each, Rfl: 10   Aspirin-Salicylamide-Caffeine (BC HEADACHE POWDER PO), Take 1 packet by mouth daily as needed (for pain or headache). , Disp: , Rfl:    celecoxib (CELEBREX) 200 MG capsule, TAKE 1 CAPSULE BY MOUTH EVERY DAY AS NEEDED, Disp: 90 capsule, Rfl: 1   diphenoxylate-atropine (LOMOTIL)  2.5-0.025 MG tablet, TAKE 1 TABLET BY MOUTH 4 (FOUR) TIMES DAILY AS NEEDED FOR DIARRHEA OR LOOSE STOOLS., Disp: 120 tablet, Rfl: 2   divalproex (DEPAKOTE) 125 MG DR tablet, Take 1 tablet (125 mg total) by mouth at bedtime., Disp: 60 tablet, Rfl: 11   donepezil (ARICEPT) 5 MG tablet, Take 1 tablet (5 mg total) by mouth daily., Disp: 30 tablet, Rfl: 11   mirtazapine (REMERON) 7.5 MG tablet, TAKE 1 TABLET BY MOUTH AT BEDTIME., Disp: 90 tablet, Rfl: 1   mometasone (NASONEX) 50 MCG/ACT nasal spray, PLACE 2 SPRAYS INTO THE NOSE DAILY., Disp: 51 each, Rfl: 4   Multiple Vitamin (MULITIVITAMIN WITH MINERALS) TABS, Take 1 tablet by mouth daily., Disp: , Rfl:    Polyethyl Glycol-Propyl Glycol (SYSTANE OP), Place 1 drop into both eyes daily as needed (for dry eyes)., Disp: , Rfl:    pregabalin (LYRICA) 25 MG capsule, TAKE 1 CAPSULE BY MOUTH AT BEDTIME., Disp: 30 capsule, Rfl: 2   Tiotropium Bromide-Olodaterol (STIOLTO RESPIMAT) 2.5-2.5 MCG/ACT AERS, INHALE 2 PUFFS BY MOUTH INTO THE LUNGS DAILY, Disp: 4 g, Rfl: 5   Objective:     BP 118/64   Pulse 71   Temp 97.9 F (36.6 C)   Ht 5\' 2"  (1.575 m)   Wt 85 lb 6.4 oz (38.7 kg)   SpO2 92%   BMI 15.62 kg/m  Wt Readings from Last 3 Encounters:  02/29/24 85 lb 6.4 oz (38.7  kg)  01/08/24 80 lb (36.3 kg)  11/23/23 81 lb (36.7 kg)      Physical Exam Constitutional:      General: She is not in acute distress.    Appearance: Normal appearance. She is underweight. She is not ill-appearing, toxic-appearing or diaphoretic.  HENT:     Head: Normocephalic and atraumatic.     Right Ear: External ear normal.     Left Ear: External ear normal.  Eyes:     General: No scleral icterus.       Right eye: No discharge.        Left eye: No discharge.     Extraocular Movements: Extraocular movements intact.     Conjunctiva/sclera: Conjunctivae normal.  Cardiovascular:     Rate and Rhythm: Normal rate and regular rhythm.  Pulmonary:     Effort: Pulmonary effort is  normal. No respiratory distress.     Breath sounds: Decreased air movement present. Decreased breath sounds present.  Skin:    General: Skin is warm and dry.  Neurological:     Mental Status: She is alert and oriented to person, place, and time.  Psychiatric:        Mood and Affect: Mood normal.        Behavior: Behavior normal.      No results found for any visits on 02/29/24.    The 10-year ASCVD risk score (Arnett DK, et al., 2019) is: 15.9%    Assessment & Plan:   Underweight (BMI < 18.5)  Other insomnia  CIGARETTE SMOKER  Alcohol use  Mild cognitive impairment with concerns for Alzheimer's disease    Return in about 3 months (around 05/31/2024).  Had a long disc session about quitting tobacco and stopping drinking.  Breathing could improve and fewer calories would be expanded through the process of breathing.  Appetite could improve as well.  Will go ahead and start Remeron and follow-up in 3 months.  Mliss Sax, MD

## 2024-03-12 DIAGNOSIS — Z66 Do not resuscitate: Secondary | ICD-10-CM | POA: Diagnosis not present

## 2024-03-12 DIAGNOSIS — R0902 Hypoxemia: Secondary | ICD-10-CM | POA: Diagnosis not present

## 2024-03-12 DIAGNOSIS — F0394 Unspecified dementia, unspecified severity, with anxiety: Secondary | ICD-10-CM | POA: Diagnosis not present

## 2024-03-12 DIAGNOSIS — Z20822 Contact with and (suspected) exposure to covid-19: Secondary | ICD-10-CM | POA: Diagnosis not present

## 2024-03-12 DIAGNOSIS — I959 Hypotension, unspecified: Secondary | ICD-10-CM | POA: Diagnosis not present

## 2024-03-12 DIAGNOSIS — M1990 Unspecified osteoarthritis, unspecified site: Secondary | ICD-10-CM | POA: Diagnosis not present

## 2024-03-12 DIAGNOSIS — R918 Other nonspecific abnormal finding of lung field: Secondary | ICD-10-CM | POA: Diagnosis not present

## 2024-03-12 DIAGNOSIS — J441 Chronic obstructive pulmonary disease with (acute) exacerbation: Secondary | ICD-10-CM | POA: Diagnosis not present

## 2024-03-12 DIAGNOSIS — H269 Unspecified cataract: Secondary | ICD-10-CM | POA: Diagnosis not present

## 2024-03-12 DIAGNOSIS — J159 Unspecified bacterial pneumonia: Secondary | ICD-10-CM | POA: Diagnosis not present

## 2024-03-12 DIAGNOSIS — F17210 Nicotine dependence, cigarettes, uncomplicated: Secondary | ICD-10-CM | POA: Diagnosis not present

## 2024-03-12 DIAGNOSIS — Z681 Body mass index (BMI) 19 or less, adult: Secondary | ICD-10-CM | POA: Diagnosis not present

## 2024-03-12 DIAGNOSIS — J440 Chronic obstructive pulmonary disease with acute lower respiratory infection: Secondary | ICD-10-CM | POA: Diagnosis not present

## 2024-03-12 DIAGNOSIS — J189 Pneumonia, unspecified organism: Secondary | ICD-10-CM | POA: Diagnosis not present

## 2024-03-12 DIAGNOSIS — I10 Essential (primary) hypertension: Secondary | ICD-10-CM | POA: Diagnosis not present

## 2024-03-12 DIAGNOSIS — J449 Chronic obstructive pulmonary disease, unspecified: Secondary | ICD-10-CM | POA: Diagnosis not present

## 2024-03-12 DIAGNOSIS — Z79899 Other long term (current) drug therapy: Secondary | ICD-10-CM | POA: Diagnosis not present

## 2024-03-12 DIAGNOSIS — E43 Unspecified severe protein-calorie malnutrition: Secondary | ICD-10-CM | POA: Diagnosis not present

## 2024-03-12 DIAGNOSIS — J181 Lobar pneumonia, unspecified organism: Secondary | ICD-10-CM | POA: Diagnosis not present

## 2024-03-12 DIAGNOSIS — F0390 Unspecified dementia without behavioral disturbance: Secondary | ICD-10-CM | POA: Diagnosis not present

## 2024-03-12 DIAGNOSIS — Z91199 Patient's noncompliance with other medical treatment and regimen due to unspecified reason: Secondary | ICD-10-CM | POA: Diagnosis not present

## 2024-03-12 DIAGNOSIS — J9601 Acute respiratory failure with hypoxia: Secondary | ICD-10-CM | POA: Diagnosis not present

## 2024-03-12 DIAGNOSIS — R0602 Shortness of breath: Secondary | ICD-10-CM | POA: Diagnosis not present

## 2024-03-15 ENCOUNTER — Telehealth: Payer: Self-pay | Admitting: Physician Assistant

## 2024-03-15 NOTE — Telephone Encounter (Signed)
 Pt's daughter called in stating they are having issues with family over her mother's medication. She states her sister has decided to stop giving the pt her depakote and mirtazapine abruptly and took them out of the house. She had an altercation with her sister over it. She wants to see what she needs to do about her sister? If she should report it?

## 2024-03-15 NOTE — Telephone Encounter (Signed)
 Will has been changed letting you know.

## 2024-03-15 NOTE — Telephone Encounter (Signed)
 Daughter called, her mom is still drinking. Her daughters got into a fight and meds are not being given. A lot of changes and her daughter is handling this situation to protect her. I advised to call social services. Father is in his hospice.

## 2024-03-16 DIAGNOSIS — J9601 Acute respiratory failure with hypoxia: Secondary | ICD-10-CM | POA: Diagnosis not present

## 2024-03-16 DIAGNOSIS — J189 Pneumonia, unspecified organism: Secondary | ICD-10-CM | POA: Diagnosis not present

## 2024-03-18 DIAGNOSIS — J189 Pneumonia, unspecified organism: Secondary | ICD-10-CM | POA: Diagnosis not present

## 2024-03-19 ENCOUNTER — Telehealth: Payer: Self-pay

## 2024-03-19 NOTE — Telephone Encounter (Signed)
 Per chart patient has recent admission 03/12/24-03/15/24 [facility] for COPD with acute exacerbation. Patient is currently being seen by home health multiple times per week, per notes they are monitoring her O2 and medications. They also note she is living at home, alone.   Patient was scheduled for PFT 03/21/24 but this was canceled and has not been rescheduled; LOV 09/19/23 and has not scheduled a return visit (recommended 62mo f/u).   Please advise, thank you!

## 2024-03-19 NOTE — Telephone Encounter (Signed)
 Copied from CRM [id number]. Topic: Clinical - Medical Advice >> Mar 18, 2024 12:30 PM Chantha C wrote: Reason for CRM: Patient's child Vonne Guarneri [phone] states patient has Alzheimr's and has been in the hosptial twice for COPD flares up, last week with pneumonia and is suppose to be oxygen therapy full time, but is not doing oxygen treatment. Patient was not taking her inhalers. Patient is really confused due to Alzheimer's. Vonne Guarneri lives out of town, sick and cannot take patient in on Thursday 03/21/24. Vonne Guarneri would like to speak with nurse, please advise and call back.

## 2024-03-25 NOTE — Telephone Encounter (Signed)
 Spoke to patient's daughter, Kelly(DPR) Dr. Waylan Haggard, please advise if okay to double book 4/24? First available is not until 05/14/2024.

## 2024-03-25 NOTE — Telephone Encounter (Signed)
 Please make a follow-up visit at next available with me or APP.  Can also offer video visit if it is more convenient for the patient and family.

## 2024-03-26 ENCOUNTER — Telehealth: Payer: Self-pay | Admitting: Pulmonary Disease

## 2024-03-26 NOTE — Telephone Encounter (Signed)
 Ok to double book on 4/24

## 2024-03-26 NOTE — Telephone Encounter (Signed)
 Spoke to patient's daughter, Kelly(DPR) and offered 03/28/2024 appt. She stated that her sister would like to be present at the appt and she works on Tuesdays and Thursday. Offered next available, which is 05/17/2024. Loetta Ringer is going to speak with her sister and call back to schedule.

## 2024-03-26 NOTE — Telephone Encounter (Signed)
Please advise on sooner appointment.

## 2024-03-26 NOTE — Telephone Encounter (Signed)
 Copied from CRM [id number]. Topic: Appointments - Scheduling Inquiry for Clinic >> Mar 26, 2024  3:16 PM Crist Dominion wrote: Reason for CRM: Patients daughter is requesting clinic to ask Dr. Waylan Haggard if he can make an exception and see the patient in May as she needs a hospital follow up for her COPD flare up with pneumonia and is suppose to be on oxygen therapy full time, but is not doing the oxygen treatment. Patient was not taking her inhalers. Patient is really confused due to Alzheimer's

## 2024-03-27 NOTE — Telephone Encounter (Signed)
 Please make a sooner appointment.  Can schedule in May as per family request. Okay to double book if there are no open spots

## 2024-03-27 NOTE — Telephone Encounter (Signed)
 Patient is scheduled for 5/5 at 2:00 P.M.

## 2024-03-29 NOTE — Telephone Encounter (Signed)
 Appt scheduled 04/08/2024. Will close encounter.

## 2024-04-01 ENCOUNTER — Telehealth: Payer: Self-pay

## 2024-04-01 ENCOUNTER — Ambulatory Visit (INDEPENDENT_AMBULATORY_CARE_PROVIDER_SITE_OTHER): Admitting: Family Medicine

## 2024-04-01 ENCOUNTER — Encounter: Payer: Self-pay | Admitting: Family Medicine

## 2024-04-01 VITALS — BP 124/74 | HR 84 | Temp 97.2°F | Ht 62.0 in | Wt 81.4 lb

## 2024-04-01 DIAGNOSIS — F17200 Nicotine dependence, unspecified, uncomplicated: Secondary | ICD-10-CM

## 2024-04-01 DIAGNOSIS — J449 Chronic obstructive pulmonary disease, unspecified: Secondary | ICD-10-CM

## 2024-04-01 DIAGNOSIS — F1090 Alcohol use, unspecified, uncomplicated: Secondary | ICD-10-CM | POA: Diagnosis not present

## 2024-04-01 DIAGNOSIS — G4709 Other insomnia: Secondary | ICD-10-CM

## 2024-04-01 DIAGNOSIS — R636 Underweight: Secondary | ICD-10-CM | POA: Diagnosis not present

## 2024-04-01 DIAGNOSIS — Z681 Body mass index (BMI) 19 or less, adult: Secondary | ICD-10-CM | POA: Diagnosis not present

## 2024-04-01 NOTE — Progress Notes (Signed)
 Established Patient Office Visit   Subjective:  Patient ID: KAIAH BOULOS, female    DOB: 12/25/1950  Age: 73 y.o. MRN: [id number]  Chief Complaint  Patient presents with   Hospitalization Follow-up    Pt was in ER on 4.8. [facility]. PT states she feels better. Pt was sent home with supplemental O2.     HPI Encounter Diagnoses  Name Primary?   Other insomnia Yes   CIGARETTE SMOKER    Alcohol use    Underweight (BMI < 18.5)    COPD, mild (HCC)    For follow-up of above.  Recent hospitalization for COPD exacerbation.  She was discharged with as needed oxygen.  Follow-up with pulmonology as scheduled checkup 5/8.  She has not been taking her mirtazapine .  She continues to smoke and drink alcohol.  She is accompanied by her daughter Maida Sciara today. History (Optional):[id number]}  Review of Systems  Constitutional: Negative.   HENT: Negative.    Eyes:  Negative for blurred vision, discharge and redness.  Respiratory: Negative.    Cardiovascular: Negative.   Gastrointestinal:  Negative for abdominal pain.  Genitourinary: Negative.   Musculoskeletal: Negative.  Negative for myalgias.  Skin:  Negative for rash.  Neurological:  Negative for tingling, loss of consciousness and weakness.  Endo/Heme/Allergies:  Negative for polydipsia.     Current Outpatient Medications:    albuterol  (VENTOLIN  HFA) 108 (90 Base) MCG/ACT inhaler, Inhale 2 puffs into the lungs every 6 (six) hours as needed for wheezing or shortness of breath., Disp: 8 g, Rfl: 6   albuterol  (VENTOLIN  HFA) 108 (90 Base) MCG/ACT inhaler, 1 PUFF EVERY 8 HOURS AS NEEDED, Disp: 18 each, Rfl: 10   Aspirin-Salicylamide-Caffeine (BC HEADACHE POWDER PO), Take 1 packet by mouth daily as needed (for pain or headache). , Disp: , Rfl:    celecoxib  (CELEBREX ) 200 MG capsule, TAKE 1 CAPSULE BY MOUTH EVERY DAY AS NEEDED, Disp: 90 capsule, Rfl: 1   diphenoxylate -atropine  (LOMOTIL ) 2.5-0.025 MG tablet, TAKE 1 TABLET BY MOUTH 4 (FOUR) TIMES  DAILY AS NEEDED FOR DIARRHEA OR LOOSE STOOLS., Disp: 120 tablet, Rfl: 2   divalproex  (DEPAKOTE ) 125 MG DR tablet, Take 1 tablet (125 mg total) by mouth at bedtime., Disp: 60 tablet, Rfl: 11   donepezil  (ARICEPT ) 5 MG tablet, Take 1 tablet (5 mg total) by mouth daily., Disp: 30 tablet, Rfl: 11   mirtazapine  (REMERON ) 7.5 MG tablet, TAKE 1 TABLET BY MOUTH AT BEDTIME., Disp: 90 tablet, Rfl: 1   mometasone  (NASONEX ) 50 MCG/ACT nasal spray, PLACE 2 SPRAYS INTO THE NOSE DAILY., Disp: 51 each, Rfl: 4   Multiple Vitamin (MULITIVITAMIN WITH MINERALS) TABS, Take 1 tablet by mouth daily., Disp: , Rfl:    Polyethyl Glycol-Propyl Glycol (SYSTANE OP), Place 1 drop into both eyes daily as needed (for dry eyes)., Disp: , Rfl:    pregabalin  (LYRICA ) 25 MG capsule, TAKE 1 CAPSULE BY MOUTH AT BEDTIME., Disp: 30 capsule, Rfl: 2   Tiotropium Bromide -Olodaterol (STIOLTO RESPIMAT ) 2.5-2.5 MCG/ACT AERS, INHALE 2 PUFFS BY MOUTH INTO THE LUNGS DAILY, Disp: 4 g, Rfl: 5   Objective:     BP 124/74 (Cuff Size: Small)   Pulse 84   Temp (!) 97.2 F (36.2 C) (Temporal)   Ht 5\' 2"  (1.575 m)   Wt 81 lb 6.4 oz (36.9 kg)   SpO2 93%   BMI 14.89 kg/m  Wt Readings from Last 3 Encounters:  04/01/24 81 lb 6.4 oz (36.9 kg)  02/29/24 85 lb 6.4 oz (38.7  kg)  01/08/24 80 lb (36.3 kg)      Physical Exam Constitutional:      General: She is not in acute distress.    Appearance: Normal appearance. She is underweight. She is not ill-appearing, toxic-appearing or diaphoretic.  HENT:     Head: Normocephalic and atraumatic.     Right Ear: External ear normal.     Left Ear: External ear normal.  Eyes:     General: No scleral icterus.       Right eye: No discharge.        Left eye: No discharge.     Extraocular Movements: Extraocular movements intact.     Conjunctiva/sclera: Conjunctivae normal.  Cardiovascular:     Rate and Rhythm: Normal rate and regular rhythm.  Pulmonary:     Effort: Pulmonary effort is normal. No  respiratory distress.     Breath sounds: Decreased air movement present. Decreased breath sounds present.  Skin:    General: Skin is warm and dry.  Neurological:     Mental Status: She is alert and oriented to person, place, and time.  Psychiatric:        Mood and Affect: Mood normal.        Behavior: Behavior normal.      No results found for any visits on 04/01/24.    The ASCVD Risk score (Arnett DK, et al., 2019) failed to calculate for the following reasons:   Cannot find a previous HDL lab   Cannot find a previous total cholesterol lab    Assessment & Plan:   Other insomnia  CIGARETTE SMOKER  Alcohol use  Underweight (BMI < 18.5)  COPD, mild (HCC)    Return in about 3 months (around 07/01/2024).  She says she is averaging 1 beer daily.  Reminded her of the dangers of smoking with oxygen use.  She assures me that she is only smoking outside.  The oxygen is in her bedroom.  Encouraged her to start the Remeron .  Tonna Frederic, MD

## 2024-04-01 NOTE — Transitions of Care (Post Inpatient/ED Visit) (Signed)
 04/01/2024  Name: Lindsey Rowland MRN: [id number] DOB: December 08, 1950  Today's TOC FU Call Status: Today's TOC FU Call Status:: Successful TOC FU Call Completed TOC FU Call Complete Date: 04/01/24 Patient's Name and Date of Birth confirmed.  Transition Care Management Follow-up Telephone Call Date of Discharge: 03/17/24 Discharge Facility: Other [facility]) Name of Other (Non[facility]) Discharge Facility: [facility] Type of Discharge: Emergency Department Reason for ED Visit: Respiratory How have you been since you were released from the hospital?: Better Any questions or concerns?: No  Items Reviewed: Did you receive and understand the discharge instructions provided?: No Medications obtained,verified, and reconciled?: Yes (Medications Reviewed) Any new allergies since your discharge?: No Dietary orders reviewed?: NA Do you have support at home?: Yes People in Home [RPT]: child(ren), adult  Medications Reviewed Today: Medications Reviewed Today     Reviewed by Anders Katz, CMA (Certified Medical Assistant) on 04/01/24 at 1405  Med List Status: <None>   Medication Order Taking? Sig Documenting Provider Last Dose Status Informant  albuterol  (VENTOLIN  HFA) 108 (90 Base) MCG/ACT inhaler [id number] Yes Inhale 2 puffs into the lungs every 6 (six) hours as needed for wheezing or shortness of breath. Mannam, Praveen, MD Taking Active   albuterol  (VENTOLIN  HFA) 108 (90 Base) MCG/ACT inhaler [id number] Yes 1 PUFF EVERY 8 HOURS AS NEEDED Mannam, Praveen, MD Taking Active   Aspirin-Salicylamide-Caffeine (BC HEADACHE POWDER PO) [id number] Yes Take 1 packet by mouth daily as needed (for pain or headache).  [provider] Taking Active Self  celecoxib  (CELEBREX ) 200 MG capsule [id number] Yes TAKE 1 CAPSULE BY MOUTH EVERY DAY AS NEEDED Tonna Frederic, MD Taking Active   diphenoxylate -atropine  (LOMOTIL ) 2.5-0.025 MG tablet [id number] Yes TAKE 1 TABLET BY MOUTH 4 (FOUR)  TIMES DAILY AS NEEDED FOR DIARRHEA OR LOOSE STOOLS. Tonna Frederic, MD Taking Active   divalproex  (DEPAKOTE ) 125 MG DR tablet [id number] Yes Take 1 tablet (125 mg total) by mouth at bedtime. Wertman, Sara E, PA-C Taking Active   donepezil  (ARICEPT ) 5 MG tablet [id number] Yes Take 1 tablet (5 mg total) by mouth daily. Wertman, Sara E, PA-C Taking Active   mirtazapine  (REMERON ) 7.5 MG tablet [id number] Yes TAKE 1 TABLET BY MOUTH AT BEDTIME. Tonna Frederic, MD Taking Active   mometasone  (NASONEX ) 50 MCG/ACT nasal spray [id number] Yes PLACE 2 SPRAYS INTO THE NOSE DAILY. Tonna Frederic, MD Taking Active   Multiple Vitamin [facility] WITH MINERALS) TABS [id number] Yes Take 1 tablet by mouth daily. [provider] Taking Active Self  Polyethyl Glycol-Propyl Glycol (SYSTANE OP) [id number] Yes Place 1 drop into both eyes daily as needed (for dry eyes). [provider] Taking Active Self  pregabalin  (LYRICA ) 25 MG capsule [id number] Yes TAKE 1 CAPSULE BY MOUTH AT BEDTIME. Tonna Frederic, MD Taking Active   Tiotropium Bromide -Olodaterol (STIOLTO RESPIMAT ) 2.5-2.5 MCG/ACT AERS [id number] Yes INHALE 2 PUFFS BY MOUTH INTO THE LUNGS DAILY Tonna Frederic, MD Taking Active             Home Care and Equipment/Supplies: Were Home Health Services Ordered?: NA Any new equipment or medical supplies ordered?: Yes Name of Medical supply agency?: [facility] Were you able to get the equipment/medical supplies?: Yes Do you have any questions related to the use of the equipment/supplies?: No  Functional Questionnaire: Do you need assistance with bathing/showering or dressing?: No Do you need assistance with meal preparation?: No Do you need assistance with eating?: No Do you  have difficulty maintaining continence: No Do you need assistance with getting out of bed/getting out of a chair/moving?: No Do you have difficulty  managing or taking your medications?: No  Follow up appointments reviewed: PCP Follow-up appointment confirmed?: Yes Date of PCP follow-up appointment?: 04/01/24 Follow-up Provider: Dr. Tilmon Font [facility] Follow-up appointment confirmed?: NA Do you need transportation to your follow-up appointment?: No Do you understand care options if your condition(s) worsen?: Yes-patient verbalized understanding    SIGNATURE Laroy Plunk, CMA

## 2024-04-08 ENCOUNTER — Ambulatory Visit: Admitting: Pulmonary Disease

## 2024-04-08 ENCOUNTER — Ambulatory Visit (INDEPENDENT_AMBULATORY_CARE_PROVIDER_SITE_OTHER)

## 2024-04-08 ENCOUNTER — Encounter: Payer: Self-pay | Admitting: Pulmonary Disease

## 2024-04-08 VITALS — BP 122/70 | HR 72 | Ht 65.0 in | Wt 81.8 lb

## 2024-04-08 DIAGNOSIS — J449 Chronic obstructive pulmonary disease, unspecified: Secondary | ICD-10-CM

## 2024-04-08 DIAGNOSIS — J439 Emphysema, unspecified: Secondary | ICD-10-CM | POA: Diagnosis not present

## 2024-04-08 DIAGNOSIS — J189 Pneumonia, unspecified organism: Secondary | ICD-10-CM

## 2024-04-08 DIAGNOSIS — J441 Chronic obstructive pulmonary disease with (acute) exacerbation: Secondary | ICD-10-CM

## 2024-04-08 DIAGNOSIS — Z85118 Personal history of other malignant neoplasm of bronchus and lung: Secondary | ICD-10-CM

## 2024-04-08 DIAGNOSIS — F17210 Nicotine dependence, cigarettes, uncomplicated: Secondary | ICD-10-CM | POA: Diagnosis not present

## 2024-04-08 DIAGNOSIS — F0390 Unspecified dementia without behavioral disturbance: Secondary | ICD-10-CM

## 2024-04-08 MED ORDER — IPRATROPIUM-ALBUTEROL 0.5-2.5 (3) MG/3ML IN SOLN: 3.0000 mL | RESPIRATORY_TRACT | Status: DC | PRN

## 2024-04-08 MED ORDER — TRELEGY ELLIPTA 200-62.5-25 MCG/ACT IN AEPB: 1.0000 | INHALATION_SPRAY | Freq: Every day | RESPIRATORY_TRACT | 3 refills | Status: AC

## 2024-04-08 NOTE — Addendum Note (Signed)
 Addended by: Luana Rumple R on: 04/08/2024 04:03 PM   Modules accepted: Orders

## 2024-04-08 NOTE — Patient Instructions (Signed)
 VISIT SUMMARY:  Today, we discussed your recent respiratory issues, including your COPD exacerbation and pneumonia, and evaluated your current oxygen needs. We also reviewed your medication regimen and made some adjustments to help manage your symptoms better. Additionally, we addressed your early-stage dementia and its impact on your medication adherence.  YOUR PLAN:  -CHRONIC OBSTRUCTIVE PULMONARY DISEASE (COPD) WITH EXACERBATION: COPD is a chronic lung disease that makes it hard to breathe. Your recent exacerbations were likely due to pollen exposure and heat, and your continued smoking is contributing to these issues. We are switching your inhaler to Trelegy, which you will use once daily, and we are also ordering Duoneb for your nebulizer as a rescue medication. We will check your oxygen levels while you walk to document your needs for insurance purposes.  -COMMUNITY-ACQUIRED PNEUMONIA: Pneumonia is an infection in the lungs. You were diagnosed with pneumonia in April, and while your symptoms have improved, you still experience shortness of breath. We will order a chest x-ray to see if the pneumonia has resolved.  -LUNG CANCER: You have a history of lung cancer in the right lung, but there are no current symptoms or concerns related to it at this time.   INSTRUCTIONS:  Please follow up with a chest x-ray to assess the resolution of your pneumonia. We will also check your oxygen levels during ambulation to document your needs for insurance purposes. Continue using your new Trelegy inhaler once daily and use Duoneb in your nebulizer as needed. If you have any questions or concerns, please contact our office.

## 2024-04-08 NOTE — Progress Notes (Addendum)
 Lindsey Rowland    [id number]    28-Dec-1950  Primary Care Physician:Kremer, Adolm Ahumada, MD  Referring Physician: Tonna Frederic, MD [address] [place],  [place] [address]  Chief complaint:   Follow up for COPD  HPI: 73 y.o. who  has a past medical history of Actinic keratoses (01/14/2021), Allergic rhinitis (12/10/2008), Arthropathy of ankle and foot (06/10/2009), Chronic obstructive pulmonary disease (07/25/2007), Coronary artery calcification (01/22/2018), Diverticulitis of colon (without mention of hemorrhage) (07/15/2008), Elevated alcohol use, Epiretinal membrane (05/08/2011), Gastroesophageal reflux disease (11/08/2021), Generalized anxiety disorder (10/16/2007), Headache, History of poliomyelitis, History of radiation therapy (09/10/2018-09/17/2018), Hypothyroidism (06/09/2023), Intercostal pain (05/04/2021), Irritable bowel syndrome (03/09/2010), Lentigo (01/14/2021), Malignant neoplasm of unspecified part of unspecified bronchus or lung (06/09/2023), Mass of chest wall, right (05/04/2021), Mild cognitive impairment with concerns for Alzheimer's disease (10/13/2023), Mild protein-calorie malnutrition (06/09/2023), Non-small cell cancer of right lung (04/16/2018), Nuclear cataract (02/23/2012), Osteoarthritis (12/10/2008), Osteopenia, Restless legs syndrome (01/20/2017), Retinal detachment of right eye with retinal break (02/23/2012), Seborrheic keratoses (01/14/2021), Spasm of muscle (06/29/2010), Status post intraocular lens implant (02/23/2012), Tobacco use (06/29/2010), and Ulcer.   Discussed the use of AI scribe software for clinical note transcription with the patient, who gave verbal consent to proceed.  The patient, with a history of COPD and lung cancer, presents for a follow-up appointment after gap of 3 years after missing previous appointments due to confusion and memory loss. The patient is also experiencing weight loss and decreased appetite,  and is receiving palliative care. The patient is on Stiolto for COPD and continues to smoke, albeit less than before. The patient also has a history of asbestos exposure from her previous job at a [employer]. The patient's home was recently treated for mold in 2023, which was suspected to be affecting the patient's cognitive and respiratory health.   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  Continues to smoke a few cigarettes every day Travel history: No significant travel history Relevant family history: No family history of lung disease  Interim history: Discussed the use of AI scribe software for clinical note transcription with the patient, who gave verbal consent to proceed.  History of Present Illness Lindsey Rowland is a 73 year old female with COPD and a history of lung cancer who presents for evaluation of oxygen needs and management of COPD exacerbation. She is accompanied by her daughter, Maida Sciara.  She was hospitalized in March and April at [facility] for respiratory issues. In March, she was admitted with severe respiratory symptoms but was not diagnosed with pneumonia. Two weeks later, she was diagnosed with pneumonia in the right lung and was hospitalized again. She was discharged with a 'hospital at home' setup, including supplemental oxygen, which she used for three days. The family is seeking guidance on whether she can discontinue oxygen use and what home care can be provided before returning the hospital's oxygen equipment.  She is currently using oxygen as needed, approximately at a setting of three, but there is uncertainty about her ability to discontinue its use. Over the past two months, she has experienced increased difficulty breathing, requiring more frequent use of oxygen, especially during activities such as  cooking, cleaning, and bathing. She continues to smoke, which may be contributing to her respiratory issues.  Her medication regimen includes Stiolto and albuterol   inhalers, with albuterol  as a rescue inhaler up to six times a day. She also has a nebulizer at home, but is running low on the liquid medication needed for it. There is some confusion about the exact usage of her inhalers and nebulizer treatments.  She has a history of lung cancer in the right lung, and her recent pneumonia was in the left lung. She has undergone CT and x-ray imaging, which initially showed no pneumonia, but later confirmed its presence in the left lung base and lingula.  She has early-stage dementia or Alzheimer's, for which she takes a memory pill in the morning. She was previously on Remeron  and Depakote  but discontinued them due to experiencing nightmares. Her adherence to medication is inconsistent, partly due to memory issues.   Outpatient Encounter Medications as of 04/08/2024  Medication Sig   albuterol  (VENTOLIN  HFA) 108 (90 Base) MCG/ACT inhaler Inhale 2 puffs into the lungs every 6 (six) hours as needed for wheezing or shortness of breath.   albuterol  (VENTOLIN  HFA) 108 (90 Base) MCG/ACT inhaler 1 PUFF EVERY 8 HOURS AS NEEDED   Aspirin-Salicylamide-Caffeine (BC HEADACHE POWDER PO) Take 1 packet by mouth daily as needed (for pain or headache).    celecoxib  (CELEBREX ) 200 MG capsule TAKE 1 CAPSULE BY MOUTH EVERY DAY AS NEEDED   diphenoxylate -atropine  (LOMOTIL ) 2.5-0.025 MG tablet TAKE 1 TABLET BY MOUTH 4 (FOUR) TIMES DAILY AS NEEDED FOR DIARRHEA OR LOOSE STOOLS.   divalproex  (DEPAKOTE ) 125 MG DR tablet Take 1 tablet (125 mg total) by mouth at bedtime.   donepezil  (ARICEPT ) 5 MG tablet Take 1 tablet (5 mg total) by mouth daily.   mirtazapine  (REMERON ) 7.5 MG tablet TAKE 1 TABLET BY MOUTH AT BEDTIME.   mometasone  (NASONEX ) 50 MCG/ACT nasal spray PLACE 2 SPRAYS INTO THE NOSE DAILY.   Multiple Vitamin (MULITIVITAMIN  WITH MINERALS) TABS Take 1 tablet by mouth daily.   Polyethyl Glycol-Propyl Glycol (SYSTANE OP) Place 1 drop into both eyes daily as needed (for dry eyes).   pregabalin  (LYRICA ) 25 MG capsule TAKE 1 CAPSULE BY MOUTH AT BEDTIME.   Tiotropium Bromide -Olodaterol (STIOLTO RESPIMAT ) 2.5-2.5 MCG/ACT AERS INHALE 2 PUFFS BY MOUTH INTO THE LUNGS DAILY   No facility-administered encounter medications on file as of 04/08/2024.   Physical Exam: Blood pressure 122/70, pulse 72, height 5\' 5"  (1.651 m), weight 81 lb 12.8 oz (37.1 kg), SpO2 92%. Gen:      No acute distress HEENT:  EOMI, sclera anicteric Neck:     No masses; no thyromegaly Lungs:    Clear to auscultation bilaterally; normal respiratory effort CV:         Regular rate and rhythm; no murmurs Abd:      + bowel sounds; soft, non-tender; no palpable masses, no distension Ext:    No edema; adequate peripheral perfusion Skin:      Warm and dry; no rash Neuro: alert and oriented x 3 Psych: normal mood and affect   Data Reviewed: Imaging: CT chest 08/23/2023 Postradiation changes in the right lung base.  Stable 5 mm nodule in the left lung, extensive emphysema I had reviewed the images personally  CTA [[facility]] 02/21/2024 1. No embolism to the proximal subsegmental pulmonary artery level.  2. Stable post treatment changes at the right lung base. No evidence  of local recurrent or metastatic disease.  3. No lung mass, consolidation, pleural effusion or pneumothorax.  4. Multiple other nonacute observations, as described above.   Chest x-ray [facility]] 03/12/2024 Increasing  opacities at the left lung base/lingula concerning for  pneumonia.   PFTs: 03/20/2018 FVC 3.15 [102%], FEV1 1.78 [76%], F/F57, TLC 7.14 [141%], DLCO 9.52 [39%] Moderate obstruction with air trapping hyperinflation, severe diffusion impairment  Labs:  Assessment & Plan Chronic obstructive pulmonary disease (COPD) with exacerbation COPD exacerbations in March and  April, likely due to pollen exposure and heat. She continues to smoke, contributing to exacerbations. Currently on supplemental oxygen as needed. Stiolto and albuterol  inhalers are used, but adherence is uncertain due to memory issues. Consideration of switching to Trelegy, which includes a steroid, to reduce exacerbations and hospitalizations.  - Order Trelegy inhaler to replace Stiolto for once daily use. - Order Duoneb for nebulizer as a rescue medication. - Qualified for 3 to 4 L portable and nocturnal oxygen.  We will place order  Hypoxia, need for oxygen SATURATION QUALIFICATIONS: (This note is used to comply with regulatory documentation for home oxygen)  Patient Saturations on Room Air at Rest = 95%  Patient Saturations on Room Air while Ambulating = 86%  Patient Saturations on 3 Liters of oxygen while Ambulating = 96%  Please briefly explain why patient needs home oxygen:patient could not do more than a few steps before O2 dropped to 86% placed on 3/4 L of O2 went back up to 96%   Community-acquired pneumonia Diagnosed in April, affecting the left lung base and lingula. Symptoms have improved, but shortness of breath persists. Previous imaging showed pneumonia on the left side, with lung cancer on the right. A follow-up chest x-ray is planned to assess resolution, acknowledging potential persistent opacity for 6-8 weeks. - Order chest x-ray to assess resolution of pneumonia.  Non-small cell lung cancer, Diagnosed in 2019 No evidence of recurrence on recent CT scan Follow-up with oncology  Early stage dementia Early stage dementia with memory issues affecting medication adherence. Currently on a morning memory medication, but avoids nighttime medications due to adverse effects like nightmares.     Tobacco Use Long history of smoking, with some recent reduction in use. Previous attempts to quit, including use of Chantix and hypnosis, were unsuccessful.  Time spent counseling-5  minutes.  Reassess at return visit. -Encourage continued efforts to quit smoking.  Exposure to Asbestos and Mold History of occupational exposure to asbestos and recent exposure to mold at home. No evidence of interstitial lung disease on recent CT scan. -Continue current management and monitor for any new symptoms.  Recommendations: Change Stiolto to Trelegy Please order for supplemental oxygen Order DuoNebs as needed Chest x-ray  Phyllis Breeze MD [facility] 04/08/2024, 2:16 PM  CC: Tonna Frederic,*

## 2024-04-11 ENCOUNTER — Telehealth: Payer: Self-pay | Admitting: Pulmonary Disease

## 2024-04-11 NOTE — Telephone Encounter (Signed)
 Per Devra Fontana at [employer]-  Can we please have the provider change the Frequency to continuous with stationary and portable oxygen unit AND copy and paste sat's OR co-sign the nurses sat's. This is a New start.    Please advise when completed.

## 2024-04-11 NOTE — Telephone Encounter (Signed)
 Order has been updated.

## 2024-04-11 NOTE — Addendum Note (Signed)
 Addended by: Katie Parks A on: 04/11/2024 02:50 PM   Modules accepted: Orders

## 2024-04-12 NOTE — Telephone Encounter (Signed)
 Sent to [facility]. NFN

## 2024-04-15 ENCOUNTER — Telehealth: Payer: Self-pay | Admitting: Pulmonary Disease

## 2024-04-15 NOTE — Telephone Encounter (Signed)
 Lindsey Rowland; [place], [place]; [place], [place]; [place], [place]; [place], [place]; 1 other Sats still need co-signed please  Please advise when they have been signed and I will let the DME company know.  Thanks!!

## 2024-04-15 NOTE — Telephone Encounter (Signed)
 Dr. Waylan Haggard, please add walk test to your office note. Thanks

## 2024-04-16 ENCOUNTER — Telehealth: Payer: Self-pay

## 2024-04-16 ENCOUNTER — Ambulatory Visit: Payer: Self-pay

## 2024-04-16 NOTE — Telephone Encounter (Signed)
 Copied from CRM [id number]. Topic: General - Other >> Apr 16, 2024 12:37 PM Lindsey Rowland wrote: Reason for CRM:  Patient's daughter, Lindsey Rowland, calling (states mom has alzheimer). States she is to receive calls on mom because mom will not remember. Returning call to [facility] in Dr Waylan Haggard office. Please call Lindsey Rowland @ primary phone number [phone] If she misses call, please leave a detail message and she will get back to you with answers.  CXR results per Dr Waylan Haggard- Please let patient know that chest x-ray shows pneumonia is improving.  It also shows chronic COPD and postsurgical changes   I called pt's daughter, Lindsey Rowland. ( POA) I informed Lindsey Rowland of Dr Isabel Many CXR results and Lindsey Rowland verbalized understanding. Lindsey Rowland would like for pt to have an order for a home oxygen concentrator as she needs to return the one from the hospital. Routing to Dr Waylan Haggard to approve home concentrator order.

## 2024-04-17 ENCOUNTER — Telehealth: Payer: Self-pay

## 2024-04-17 NOTE — Telephone Encounter (Signed)
 Okay to have home concentrator.  Please place order.

## 2024-04-17 NOTE — Telephone Encounter (Signed)
 Copied from CRM [id number]. Topic: Clinical - Order For Equipment >> Apr 16, 2024  3:53 PM Lindsey Rowland wrote: Reason for CRM: Patient's Daughter is calling to request that we call when oxygen order is sent. Patient would like an ETA on how long before she can get the oxygen concentrator.  Spoke to patient's sister, Kelly(DPR). She is requesting update on o2 order. According to referral note, O2 sats are needing to be added to Dr. Isabel Many note.  Lindsey Rowland is also requesting a nebulizer machine. Pt's current machine is broken.  Dr. Waylan Haggard, please advise.

## 2024-04-17 NOTE — Telephone Encounter (Signed)
 Order placed 04/08/2024

## 2024-04-18 NOTE — Telephone Encounter (Signed)
 Neb and duoneb is pending.  Lm for patient's daughter, Kelly(DPR) to confirm preferred DME and pharmacy.

## 2024-04-18 NOTE — Telephone Encounter (Signed)
 Have sent to [facility]

## 2024-04-18 NOTE — Telephone Encounter (Signed)
 Okay to order nebulizer machine and DuoNebs

## 2024-04-18 NOTE — Telephone Encounter (Signed)
 Note has been updated

## 2024-04-19 ENCOUNTER — Telehealth: Payer: Self-pay | Admitting: *Deleted

## 2024-04-19 DIAGNOSIS — J441 Chronic obstructive pulmonary disease with (acute) exacerbation: Secondary | ICD-10-CM

## 2024-04-19 MED ORDER — IPRATROPIUM-ALBUTEROL 0.5-2.5 (3) MG/3ML IN SOLN: 3.0000 mL | Freq: Four times a day (QID) | RESPIRATORY_TRACT | 3 refills | Status: AC | PRN

## 2024-04-19 NOTE — Telephone Encounter (Signed)
 Copied from CRM [id number]. Topic: Clinical - Order For Equipment >> Apr 16, 2024  3:53 PM Hilton Lucky wrote: Reason for CRM: Patient's Daughter is calling to request that we call when oxygen order is sent. Patient would like an ETA on how long before she can get the oxygen concentrator. >> Apr 18, 2024 12:48 PM Margarette Shawl wrote: Pt's daughter Loetta Ringer is contacting clinic returning message from [place]. She was asked to provide preferred DME company and pharmacy  Pt has not used a DME company in the past, would prefer a DME company that has a local office near [place], [place] near pt's home. She has family that work at [employer], and if the clinic works with this DME company, she would prefer them. Daughter asks that the DME company be given her contact information to set up delivery. #[phone]  [facility] [place].  Daughter reports that her mother has dementia and has packed her oxygen equipment she has at home and has been calling [facility] to pick up their equipment. She is concerned her mother will not get the new equipment before [facility] attempts to pick up the old equipment. Her nebulizer is broken, and she does not currently have portable oxygen.    [facility] [place] [facility] and order for nebulizer machine sent to [facility] with patient's daughter, Arlana Bellini number listed for contact.  Called and left detailed VM for [employer] (daugher, DPR), letting her know the above information.

## 2024-04-19 NOTE — Telephone Encounter (Signed)
 Copied from CRM [id number]. Topic: Clinical - Order For Equipment >> Apr 16, 2024  3:53 PM Hilton Lucky wrote: Reason for CRM: Patient's Daughter is calling to request that we call when oxygen order is sent. Patient would like an ETA on how long before she can get the oxygen concentrator. >> Apr 18, 2024 12:48 PM Margarette Shawl wrote: Pt's daughter Loetta Ringer is contacting clinic returning message from [employer]. She was asked to provide preferred DME company and pharmacy  Pt has not used a DME company in the past, would prefer a DME company that has a local office near [place], [place] near pt's home. She has family that work at [employer], and if the clinic works with this DME company, she would prefer them. Daughter asks that the DME company be given her contact information to set up delivery. #[phone]  [facility] [place].  Daughter reports that her mother has dementia and has packed her oxygen equipment she has at home and has been calling [facility] to pick up their equipment. She is concerned her mother will not get the new equipment before [facility] attempts to pick up the old equipment. Her nebulizer is broken, and she does not currently have portable oxygen.   Duplicate.

## 2024-04-22 NOTE — Telephone Encounter (Signed)
 Orders placed 04/19/24 Pt's daughter, Kelly(DPR) is aware and voiced her understanding.  Nothing further needed.

## 2024-04-23 ENCOUNTER — Ambulatory Visit: Payer: Self-pay

## 2024-04-23 ENCOUNTER — Institutional Professional Consult (permissible substitution): Payer: HMO | Admitting: Psychology

## 2024-04-23 ENCOUNTER — Other Ambulatory Visit: Payer: Self-pay | Admitting: Pulmonary Disease

## 2024-04-24 ENCOUNTER — Encounter: Payer: Self-pay | Admitting: Physician Assistant

## 2024-04-24 ENCOUNTER — Ambulatory Visit: Admitting: Physician Assistant

## 2024-04-24 VITALS — BP 159/81 | HR 69 | Resp 21 | Ht 65.0 in | Wt 83.0 lb

## 2024-04-24 DIAGNOSIS — G3184 Mild cognitive impairment, so stated: Secondary | ICD-10-CM

## 2024-04-24 DIAGNOSIS — F1090 Alcohol use, unspecified, uncomplicated: Secondary | ICD-10-CM | POA: Diagnosis not present

## 2024-04-24 DIAGNOSIS — F329 Major depressive disorder, single episode, unspecified: Secondary | ICD-10-CM

## 2024-04-24 DIAGNOSIS — J441 Chronic obstructive pulmonary disease with (acute) exacerbation: Secondary | ICD-10-CM | POA: Diagnosis not present

## 2024-04-24 DIAGNOSIS — J189 Pneumonia, unspecified organism: Secondary | ICD-10-CM | POA: Diagnosis not present

## 2024-04-24 DIAGNOSIS — J449 Chronic obstructive pulmonary disease, unspecified: Secondary | ICD-10-CM | POA: Diagnosis not present

## 2024-04-24 MED ORDER — DIVALPROEX SODIUM 125 MG PO DR TAB: 125.0000 mg | DELAYED_RELEASE_TABLET | Freq: Every day | ORAL | 11 refills | Status: AC

## 2024-04-24 MED ORDER — DONEPEZIL HCL 10 MG PO TABS
10.0000 mg | ORAL_TABLET | Freq: Every day | ORAL | 3 refills | Status: AC
Start: 2024-04-24 — End: ?

## 2024-04-24 NOTE — Progress Notes (Signed)
 Assessment/Plan:    Mild Cognitive Impairment with Memory Loss  Lindsey Rowland is a delightful 73 y.o. RH female with a history of NSCLCa s/p XRT R lung, COPD, Tobacco abuse, ETOH habituation, prior asbestos exposure, possible depression per daughter's report, arthritis, polio as a child and a diagnosis of mild cognitive impairment  per neuropsychological evaluation seen today prior to her scheduled appointment in August 2025 to discuss the patient's behavior, not well-controlled and she has episodes of sundowning.  She was on Depakote  prior, but her other daughter has discontinued the medication as well as the donepezil , which "fired back". Daughter wo is here with her today re-placed her on the medication, tolerating it well. We discussed the importance to adhere to the antidementia regimen and mood control. We discussed referral to psychiatry as she needs a good mood stability, referral to psychiatry which could be very beneficial. She continues to drink 3 beers a day, needs to discontinue as it may affect her memory.  She no longer drives.       Follow up in 6  months. Repeat neuropsych in 7 to 13 months for diagnostic clarity and disease trajectory. If he discontinues alcohol and gets a psych diagnosis, the treatment of her memory will become more manageable  Recommend tobacco and alcohol cessation, counseled. Recommend behavioral health referral for possible depression, agitation, anxiety, and to further investigate any other psychiatric abnormality  Recommend 24/7 monitoring for safety  Continue donepezil  10 mg daily. Side effects were discussed  Continue Depakote  125 mg  at night, may increase to 125 mg bid, side effects discussed Recommend good control of her cardiovascular risk factors Continue to control mood as per PCP No further driving       Subjective:    This patient is accompanied in the office by her daughter who lives from [place]  and her granddaughter who  temporarily lives with her who supplements the history.  Previous records as well as any outside records available were reviewed prior to todays visit. Patient was last seen on 01/08/2024, last MoCA on 09/20/2023 was 16/30    Any changes in memory since last visit? "It is worse". Continues to have difficulty remembering new information, conversations, names.  LTM clear.  Patient has very little insight into her condition, denying that she has any memory changes. repeats oneself?  Endorsed, more often Disoriented when walking into a room? Endorsed Leaving objects? She is hoarding more often, for example she hoarding food and letting it rot. She lets the dog out without a leash.   Wandering behavior?  Endorsed, she leaves the house often and drives    Any personality changes since last visit?  Conversing with agitated.  She cannot find something in them becomes verbally aggressive which is "unlike her ". She is spending more money in "useless things". Any worsening depression?:  Endorsed, "I don't want to get better"-she says   Hallucinations or paranoia?  Denies.   Seizures? denies    Any sleep changes?  She goes to sleep late and wakes up early.  She takes mirtazapine  with some relief.  She has RLS, takes Lyrica .  Denies vivid dreams, REM behavior or sleepwalking   Sleep apnea?   Denies.   Any hygiene concerns? Denies.  Independent of bathing and dressing?  Endorsed  Does the patient needs help with medications?  Daughter in charge   Who is in charge of the finances?  Patient is in charge  Any changes in appetite? Decreased.  She is on mirtazapine , with some improvement of her appetite    Patient have trouble swallowing? Denies.   Does the patient cook? No Any headaches?   denies   Any vision changes?  Chronic back pain  denies   Ambulates with difficulty? Denies.    Recent falls or head injuries? Denies.     Unilateral weakness, numbness or tingling? denies   Any tremors?  Denies   Any anosmia?  Denies   Any incontinence of urine?  Endorsed   Any bowel dysfunction?   Denies      Patient lives with her husband, who is disabled on hospice.  She has one daughter who lives next-door.  She also has one niece and is very involved in her care, Niece wants to move out but cannot do it at this time because patient needs her help. Does the patient drive? No longer drives   Alcohol?  She drinks 3 large beers a day per daughter's report, patient denies. She had been instructed to quit, she has not done so Tobacco?  She smokes about 2 cigarettes a day   Neuropsychological evaluation 10/13/2023 Briefly, results suggested severe impairment surrounding cognitive flexibility and both encoding (i.e., learning) and delayed retrieval aspects of memory. A relative weakness was exhibited across processing speed while additional performance variability was exhibited across semantic fluency, visuospatial abilities, and recognition/consolidation aspects of memory. Regarding etiology, testing patterns do raise concern for an underlying neurodegenerative illness, namely Alzheimer's disease. Across memory testing, Ms. Meditz did not benefit from repeated exposure to novel information, exhibiting flat learning curves. After a brief delay, retention rates were 0% and 16% across list and figure tasks respectively. While her retention rate was 50% for a story task, this amounted to her recalling only two details, suggesting ongoing impairment. Finally, performances across yes/no  recognition trials were variable but below expectation overall. Taken together, this suggests evidence for rapid forgetting and an evolving and already fairly significant storage impairment, both of which are the hallmark testing patterns for this illness. Further weakness/variability across semantic fluency and cognitive flexibility would follow typical disease progression. Intact confrontation naming is encouraging in this regard. However, concerns for this illness remain. Elevated alcohol use/abuse may exacerbate cognitive impairment and could put her at a greater risk for an accelerated decline in the future. Continued medical monitoring will be important moving forward.   Initial visit 09/2023  How long did patient have memory difficulties?  For about 6 months.  Patient reports some difficulty remembering new information, recent conversations, names. She has also been going to the supermarket to get the same products, forgetting she has already purchased them. LTM is good.  repeats oneself?  Endorsed. Disoriented when walking into a room?  Patient denies   Leaving objects in unusual places?  Denies.   Wandering behavior? denies   Any personality changes, or depression, anxiety? Gets upset easily, worried that hey are attacking her because of her memory , worse if she has not been drinking.   Hallucinations or paranoia? Denies.  Seizures? Denies.    Any sleep changes?  Sleeps well . Denies vivid dreams. She has RLS , takes Lyrica  with good response. sleepwalking   Sleep apnea? Denies.   Any hygiene concerns?  Denies.   Independent of bathing and dressing? Endorsed.  Does the patient need help with medications? Patient  is in charge   Who is in charge of the finances?  Patient is in charge  Any changes in appetite? Decreased  Patient have trouble swallowing?  Denies.   Does the patient cook? No.  Any headaches?  Denies.   Chronic pain? Denies.   Ambulates with difficulty? Denies, walks  often.   Recent falls or head injuries? Denies.     Vision changes?  Denies any new issues.  Has a history of detached retina. S/p lens surgery.  Any strokelike symptoms? Denies.   Any tremors? Denies.   Any anosmia? Denies.   Any incontinence of urine? Denies.   Any bowel dysfunction? Denies. If diarrhea she takes lomotil       Patient lives with husband who is disabled. Other daughter lives next door   History of heavy alcohol intake? 2-3 large beers a day.   History of heavy tobacco use? Smokes about 2 cigs a day now, smoked more in the recent past (1 y ago) Family history of dementia?  Mother with dementia AD  Does patient drive? Yes, denies getting lost.  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    celecoxib  (CELEBREX ) 200 MG capsule TAKE 1 CAPSULE BY MOUTH EVERY DAY AS NEEDED   diphenoxylate -atropine  (LOMOTIL ) 2.5-0.025 MG tablet TAKE 1 TABLET BY MOUTH 4 (FOUR) TIMES DAILY AS NEEDED FOR DIARRHEA OR LOOSE STOOLS.   Fluticasone-Umeclidin-Vilant (TRELEGY ELLIPTA ) 200-62.5-25 MCG/ACT AEPB Inhale 1 puff into the lungs daily.   ipratropium-albuterol  (DUONEB) 0.5-2.5 (3) MG/3ML SOLN Take 3 mLs by nebulization every 6 (six) hours as needed. Diagnosis code:  J44.1, J18.9   mirtazapine  (REMERON ) 7.5 MG tablet TAKE 1 TABLET BY MOUTH AT BEDTIME.   mometasone  (NASONEX ) 50 MCG/ACT nasal spray PLACE 2 SPRAYS INTO THE NOSE DAILY.   Multiple Vitamin (MULITIVITAMIN WITH  MINERALS) TABS Take 1 tablet by mouth daily.   Polyethyl Glycol-Propyl Glycol (SYSTANE OP) Place 1 drop into both eyes daily as needed (for dry eyes).   pregabalin  (LYRICA ) 25 MG capsule TAKE 1 CAPSULE BY MOUTH AT BEDTIME.   [DISCONTINUED] divalproex  (DEPAKOTE ) 125 MG DR tablet Take 1 tablet (125 mg total) by mouth at bedtime.   [DISCONTINUED] donepezil  (ARICEPT ) 5 MG tablet Take 1 tablet (5 mg total) by mouth daily.   divalproex  (DEPAKOTE ) 125 MG DR tablet Take 1 tablet (125 mg total) by mouth at bedtime.   donepezil  (ARICEPT ) 10 MG tablet Take 1 tablet (10 mg total) by mouth daily.   Facility-Administered Encounter Medications as of 04/24/2024  Medication   ipratropium-albuterol  (DUONEB) 0.5-2.5 (3) MG/3ML nebulizer solution 3 mL        No data to display            09/20/2023   10:00 AM  Montreal Cognitive Assessment   Visuospatial/ Executive (0/5) 4  Naming (0/3) 3  Attention: Read list of digits (0/2) 2  Attention: Read list of letters (0/1) 1  Attention: Serial 7 subtraction starting at 100 (0/3) 0  Language: Repeat phrase (0/2) 0  Language : Fluency (0/1) 1  Abstraction (0/2)  0  Delayed Recall (0/5) 0  Orientation (0/6) 4  Total 15  Adjusted Score (based on education) 15    Objective:     PHYSICAL EXAMINATION:    VITALS:   Vitals:   04/24/24 1056  BP: (!) 159/81  Pulse: 69  Resp: (!) 21  SpO2: (!) 88%  Weight: 83 lb (37.6 kg)  Height: 5\' 5"  (1.651 m)    GEN:  The patient appears stated age and is in NAD. HEENT:  Normocephalic, atraumatic.   Neurological examination:  General: NAD, well-groomed, appears stated age. Orientation: The patient is alert. Oriented to person,not to  place and date Cranial nerves: There is good facial symmetry.Tearful appearing .The speech is fluent and clear. No aphasia or dysarthria. Fund of knowledge is reduced. Recent and remote memory are impaired. Attention and concentration are reduced. Able to name objects and repeat  phrases.  Hearing is intact to conversational tone.   Sensation: Sensation is intact to light touch throughout Motor: Strength is at least antigravity x4. DTR's 2/4 in UE/LE     Movement examination: Tone: There is normal tone in the UE/LE Abnormal movements:  no tremor.  No myoclonus.  No asterixis.   Coordination:  There is no decremation with RAM's. Normal finger to nose  Gait and Station: The patient has no difficulty arising out of a deep-seated chair without the use of the hands. The patient's stride length is good.  Gait is cautious and narrow.  She has chronic limp due to prior polio as a child   Thank you for allowing us  the opportunity to participate in the care of this nice patient. Please do not hesitate to contact us  for any questions or concerns.   Total time spent on today's visit was 44 minutes dedicated to this patient today, preparing to see patient, examining the patient, ordering tests and/or medications and counseling the patient, documenting clinical information in the EHR or other health record, independently interpreting results and communicating results to the patient/family, discussing treatment and goals, answering patient's questions and coordinating care.  Cc:  Tonna Frederic, MD  Tex Filbert 04/24/2024 5:39 PM

## 2024-04-24 NOTE — Patient Instructions (Addendum)
 It was a pleasure to see you today at our office.   Recommendations:   Discontinue Alcohol ! Continue   Depakote   125 mg Take 1 tab at night , may increase to 1 tab twice a day if needed  Continue Donepezil  increase to 10 mg daily  Referral to psychiatry for Major depression and anxiety  Dec 10 at 11:30   Quit alcohol and recommend discontinuing tobacco    For psychiatric meds, mood meds: Please have your primary care physician manage these medications.  If you have any severe symptoms of a stroke, or other severe issues such as confusion,severe chills or fever, etc call 911 or go to the ER as you may need to be evaluated further   For guidance regarding [facility] and if placement were needed at the facility, contact Social Worker tel: [phone]  For assessment of decision of mental capacity and competency:  Call Dr. Laverne Potter, geriatric psychiatrist at [phone]  Counseling regarding caregiver distress, including caregiver depression, anxiety and issues regarding community resources, adult day care programs, adult living facilities, or memory care questions:  please contact your  Primary Doctor's Social Worker   Whom to call: Memory  decline, memory medications: Call our office [phone]    [email or web address]   RECOMMENDATIONS FOR ALL PATIENTS WITH MEMORY PROBLEMS: 1. Continue to exercise (Recommend 30 minutes of walking everyday, or 3 hours every week) 2. Increase social interactions - continue going to [place] and enjoy social gatherings with friends and family 3. Eat healthy, avoid fried foods and eat more fruits and vegetables 4. Maintain adequate blood pressure, blood sugar, and blood cholesterol level. Reducing the risk of stroke and cardiovascular disease also helps promoting better memory. 5. Avoid stressful situations. Live a simple life and avoid aggravations. Organize your time and  prepare for the next day in anticipation. 6. Sleep well, avoid any interruptions of sleep and avoid any distractions in the bedroom that may interfere with adequate sleep quality 7. Avoid sugar, avoid sweets as there is a strong link between excessive sugar intake, diabetes, and cognitive impairment We discussed the Mediterranean diet, which has been shown to help patients reduce the risk of progressive memory disorders and reduces cardiovascular risk. This includes eating fish, eat fruits and green leafy vegetables, nuts like almonds and hazelnuts, walnuts, and also use olive oil. Avoid fast foods and fried foods as much as possible. Avoid sweets and sugar as sugar use has been linked to worsening of memory function.  There is always a concern of gradual progression of memory problems. If this is the case, then we may need to adjust level of care according to patient needs. Support, both to the patient and caregiver, should then be put into place.      DRIVING: Regarding driving, in patients with progressive memory problems, driving will be impaired. We advise to have someone else do the driving if trouble finding directions or if minor accidents are reported. Independent driving assessment is available to determine safety of driving.   If you are interested in the driving assessment, you can contact the following:  [facility] [employer] in [place] [phone]  [facility] [phone]  [facility] [phone]  [facility] [phone] or [phone]   FALL PRECAUTIONS: Be cautious when walking. Scan the area for obstacles that may increase the risk of trips and falls. When getting up in the mornings, sit up at the edge of the bed for a few minutes before getting  out of bed. Consider elevating the bed at the head end to avoid drop of blood pressure when getting up. Walk always in a well-lit room (use night lights in the walls). Avoid area rugs or  power cords from appliances in the middle of the walkways. Use a walker or a cane if necessary and consider physical therapy for balance exercise. Get your eyesight checked regularly.  FINANCIAL OVERSIGHT: Supervision, especially oversight when making financial decisions or transactions is also recommended.  HOME SAFETY: Consider the safety of the kitchen when operating appliances like stoves, microwave oven, and blender. Consider having supervision and share cooking responsibilities until no longer able to participate in those. Accidents with firearms and other hazards in the house should be identified and addressed as well.   ABILITY TO BE LEFT ALONE: If patient is unable to contact 911 operator, consider using LifeLine, or when the need is there, arrange for someone to stay with patients. Smoking is a fire hazard, consider supervision or cessation. Risk of wandering should be assessed by caregiver and if detected at any point, supervision and safe proof recommendations should be instituted.  MEDICATION SUPERVISION: Inability to self-administer medication needs to be constantly addressed. Implement a mechanism to ensure safe administration of the medications.      Mediterranean Diet A Mediterranean diet refers to food and lifestyle choices that are based on the traditions of countries located on the [employer] [place]. This way of eating has been shown to help prevent certain conditions and improve outcomes for people who have chronic diseases, like kidney disease and heart disease. What are tips for following this plan? Lifestyle  Cook and eat meals together with your family, when possible. Drink enough fluid to keep your urine clear or pale yellow. Be physically active every day. This includes: Aerobic exercise like running or swimming. Leisure activities like gardening, walking, or housework. Get 7-8 hours of sleep each night. If recommended by your health care provider, drink red wine  in moderation. This means 1 glass a day for nonpregnant women and 2 glasses a day for men. A glass of wine equals 5 oz (150 mL). Reading food labels  Check the serving size of packaged foods. For foods such as rice and pasta, the serving size refers to the amount of cooked product, not dry. Check the total fat in packaged foods. Avoid foods that have saturated fat or trans fats. Check the ingredients list for added sugars, such as corn syrup. Shopping  At the grocery store, buy most of your food from the areas near the walls of the store. This includes: Fresh fruits and vegetables (produce). Grains, beans, nuts, and seeds. Some of these may be available in unpackaged forms or large amounts (in bulk). Fresh seafood. Poultry and eggs. Low-fat dairy products. Buy whole ingredients instead of prepackaged foods. Buy fresh fruits and vegetables in-season from local farmers markets. Buy frozen fruits and vegetables in resealable bags. If you do not have access to quality fresh seafood, buy precooked frozen shrimp or canned fish, such as tuna, salmon, or sardines. Buy small amounts of raw or cooked vegetables, salads, or olives from the deli or salad bar at your store. Stock your pantry so you always have certain foods on hand, such as olive oil, canned tuna, canned tomatoes, rice, pasta, and beans. Cooking  Cook foods with extra-virgin olive oil instead of using butter or other vegetable oils. Have meat as a side dish, and have vegetables or grains as your main dish. This  means having meat in small portions or adding small amounts of meat to foods like pasta or stew. Use beans or vegetables instead of meat in common dishes like chili or lasagna. Experiment with different cooking methods. Try roasting or broiling vegetables instead of steaming or sauteing them. Add frozen vegetables to soups, stews, pasta, or rice. Add nuts or seeds for added healthy fat at each meal. You can add these to yogurt,  salads, or vegetable dishes. Marinate fish or vegetables using olive oil, lemon juice, garlic, and fresh herbs. Meal planning  Plan to eat 1 vegetarian meal one day each week. Try to work up to 2 vegetarian meals, if possible. Eat seafood 2 or more times a week. Have healthy snacks readily available, such as: Vegetable sticks with hummus. Greek yogurt. Fruit and nut trail mix. Eat balanced meals throughout the week. This includes: Fruit: 2-3 servings a day Vegetables: 4-5 servings a day Low-fat dairy: 2 servings a day Fish, poultry, or lean meat: 1 serving a day Beans and legumes: 2 or more servings a week Nuts and seeds: 1-2 servings a day Whole grains: 6-8 servings a day Extra-virgin olive oil: 3-4 servings a day Limit red meat and sweets to only a few servings a month What are my food choices? Mediterranean diet Recommended Grains: Whole-grain pasta. Brown rice. Bulgar wheat. Polenta. Couscous. Whole-wheat bread. Dwyane Glad. Vegetables: Artichokes. Beets. Broccoli. Cabbage. Carrots. Eggplant. Green beans. Chard. Kale. Spinach. Onions. Leeks. Peas. Squash. Tomatoes. Peppers. Radishes. Fruits: Apples. Apricots. Avocado. Berries. Bananas. Cherries. Dates. Figs. Grapes. Lemons. Melon. Oranges. Peaches. Plums. Pomegranate. Meats and other protein foods: Beans. Almonds. Sunflower seeds. Pine nuts. Peanuts. Cod. Salmon. Scallops. Shrimp. Tuna. Tilapia. Clams. Oysters. Eggs. Dairy: Low-fat milk. Cheese. Greek yogurt. Beverages: Water. Red wine. Herbal tea. Fats and oils: Extra virgin olive oil. Avocado oil. Grape seed oil. Sweets and desserts: [place] yogurt with honey. Baked apples. Poached pears. Trail mix. Seasoning and other foods: Basil. Cilantro. Coriander. Cumin. Mint. Parsley. Sage. Rosemary. Tarragon. Garlic. Oregano. Thyme. Pepper. Balsalmic vinegar. Tahini. Hummus. Tomato sauce. Olives. Mushrooms. Limit these Grains: Prepackaged pasta or rice dishes. Prepackaged cereal with  added sugar. Vegetables: Deep fried potatoes (french fries). Fruits: Fruit canned in syrup. Meats and other protein foods: Beef. Pork. Lamb. Poultry with skin. Hot dogs. Helene Loader. Dairy: Ice cream. Sour cream. Whole milk. Beverages: Juice. Sugar-sweetened soft drinks. Beer. Liquor and spirits. Fats and oils: Butter. Canola oil. Vegetable oil. Beef fat (tallow). Lard. Sweets and desserts: Cookies. Cakes. Pies. Candy. Seasoning and other foods: Mayonnaise. Premade sauces and marinades. The items listed may not be a complete list. Talk with your dietitian about what dietary choices are right for you. Summary The Mediterranean diet includes both food and lifestyle choices. Eat a variety of fresh fruits and vegetables, beans, nuts, seeds, and whole grains. Limit the amount of red meat and sweets that you eat. Talk with your health care provider about whether it is safe for you to drink red wine in moderation. This means 1 glass a day for nonpregnant women and 2 glasses a day for men. A glass of wine equals 5 oz (150 mL). This information is not intended to replace advice given to you by your health care provider. Make sure you discuss any questions you have with your health care provider. Document Released: 07/14/2016 Document Revised: 08/16/2016 Document Reviewed: 07/14/2016 Elsevier Interactive Patient Education  2017 [employer].

## 2024-05-01 ENCOUNTER — Other Ambulatory Visit: Payer: Self-pay | Admitting: Family Medicine

## 2024-05-01 DIAGNOSIS — K219 Gastro-esophageal reflux disease without esophagitis: Secondary | ICD-10-CM

## 2024-05-02 ENCOUNTER — Encounter: Payer: HMO | Admitting: Psychology

## 2024-05-16 ENCOUNTER — Other Ambulatory Visit: Payer: Self-pay | Admitting: Family Medicine

## 2024-05-16 DIAGNOSIS — M129 Arthropathy, unspecified: Secondary | ICD-10-CM

## 2024-05-16 DIAGNOSIS — Z8612 Personal history of poliomyelitis: Secondary | ICD-10-CM

## 2024-05-16 DIAGNOSIS — M1990 Unspecified osteoarthritis, unspecified site: Secondary | ICD-10-CM

## 2024-05-25 DIAGNOSIS — J449 Chronic obstructive pulmonary disease, unspecified: Secondary | ICD-10-CM | POA: Diagnosis not present

## 2024-05-29 ENCOUNTER — Inpatient Hospital Stay: Admitting: Pulmonary Disease

## 2024-06-05 ENCOUNTER — Telehealth: Payer: Self-pay | Admitting: Physician Assistant

## 2024-06-05 ENCOUNTER — Telehealth: Payer: Self-pay

## 2024-06-05 DIAGNOSIS — Z789 Other specified health status: Secondary | ICD-10-CM

## 2024-06-05 NOTE — Telephone Encounter (Signed)
 Copied from CRM [id number]. Topic: Clinical - Medical Advice >> Jun 05, 2024 12:42 PM [place] wrote: Reason for CRM: Patient daughter Maurice calling on behalf of mother. She has dementia that is progressing and she is doubling up on her medications everyday. She is still driving as she is not allowed to. She goes to the store numerous times a day and keeps buying the same things over and over again. She is still drinking alcohol and driving her car. She is not eating or cooking meals. Husband is with her at home and he is on hospice. She is on oxygen but will not wear her portable oxygen that was prescribed to her. Daughters niece lives in the home but she does work during the day and is not always there with her. Daughter is very worried about her mother. She is still looking for a psychiatric doctor to help as soon as possible.

## 2024-06-05 NOTE — Telephone Encounter (Signed)
 Patients daughter needs to know if sara put a referral in for the geriatric psych eval. This was supposed to have been done in may and its July, they haven't heard from anyone. Her mother is doubling her medication which is dangerous.still driving even though she is not supposed to. She is hanging up on people. She is more agitated. Dr.Kramer's office don't see a referral in her chart and they are asking where it is at.

## 2024-06-05 NOTE — Telephone Encounter (Signed)
 Tried to call daughter back she is POA.Referral was placed back in May to behavioral health. Will call again in the am

## 2024-06-06 NOTE — Telephone Encounter (Signed)
 Left a message with the after hour service on  06-06-24  Returning a call to christy

## 2024-06-09 ENCOUNTER — Other Ambulatory Visit: Payer: Self-pay | Admitting: Family Medicine

## 2024-06-09 DIAGNOSIS — G4709 Other insomnia: Secondary | ICD-10-CM

## 2024-06-10 ENCOUNTER — Other Ambulatory Visit: Payer: Self-pay | Admitting: Family Medicine

## 2024-06-10 DIAGNOSIS — G2581 Restless legs syndrome: Secondary | ICD-10-CM

## 2024-06-11 NOTE — Progress Notes (Unsigned)
 Psychiatric Initial Adult Assessment  Patient Identification: Lindsey Rowland MRN:  [id number] Date of Evaluation:  06/11/2024 Referral Source: ***  Assessment:  Lindsey Rowland is a 73 y.o. female with a history of insomnia, MNCD, alcohol use disorder, reported depression who presents in person to [facility] for initial evaluation of ***.  Patient reports ***  Risk Assessment: A suicide and violence risk assessment was performed as part of this evaluation. There patient is deemed to be at chronic elevated risk for self-harm/suicide given the following factors: {SABSUICIDERISKFACTORS:29780}. These risk factors are mitigated by the following factors: {SABSUICIDEPROTECTIVEFACTORS:29779}. The patient is deemed to be at chronic elevated risk for violence given the following factors: {SABVIOLENCERISKFACTORS:29781}. These risk factors are mitigated by the following factors: {SABVIOLENCEPROTECTIVEFACTORS:29782}. There is no *** acute risk for suicide or violence at this time. The patient was educated about relevant modifiable risk factors including following recommendations for treatment of psychiatric illness and abstaining from substance abuse.  While future psychiatric events cannot be accurately predicted, the patient does not *** currently require  acute inpatient psychiatric care and does not *** currently meet [facility]  involuntary commitment criteria.    Plan:  # *** Past medication trials:  Status of problem: *** Interventions: -- ***  # *** Past medication trials:  Status of problem: *** Interventions: -- ***  # *** Past medication trials:  Status of problem: *** Interventions: -- ***  Return to care in ***  Patient was given contact information for behavioral health clinic and was instructed to call 911 for emergencies.    Patient and plan of care will be discussed with the Attending MD ,Dr. ***, who agrees with the above statement and plan.    Subjective:  Chief Complaint: No chief complaint on file.   History of Present Illness:  ***  Past Psychiatric History:  Diagnoses: *** Medication trials: *** Previous psychiatrist/therapist: *** Hospitalizations: *** Suicide attempts: *** SIB: *** Hx of violence towards others: *** Current access to guns: *** Hx of trauma/abuse: ***  Substance Abuse History in the last 12 months:  {yes no:314532}  Past Medical History:  Past Medical History:  Diagnosis Date   Actinic keratoses 01/14/2021   Allergic rhinitis 12/10/2008   Arthropathy of ankle and foot 06/10/2009   Chronic obstructive pulmonary disease 07/25/2007   Coronary artery calcification 01/22/2018   Diverticulitis of colon (without mention of hemorrhage) 07/15/2008   Elevated alcohol use    Epiretinal membrane 05/08/2011   Gastroesophageal reflux disease 11/08/2021   Generalized anxiety disorder 10/16/2007   Headache    History of poliomyelitis    History of radiation therapy 09/10/2018-09/17/2018   SBRT 3 fx to right lung; Dr. Lynwood Nasuti   Hypothyroidism 06/09/2023   Intercostal pain 05/04/2021   Irritable bowel syndrome 03/09/2010   Lentigo 01/14/2021   Malignant neoplasm of unspecified part of unspecified bronchus or lung 06/09/2023   Mass of chest wall, right 05/04/2021   Mild cognitive impairment with concerns for Alzheimer's disease 10/13/2023   Mild protein-calorie malnutrition 06/09/2023   Non-small cell cancer of right lung 04/16/2018   Nuclear cataract 02/23/2012   Osteoarthritis 12/10/2008   Osteopenia    Restless legs syndrome 01/20/2017   Retinal detachment of right eye with retinal break 02/23/2012   Seborrheic keratoses 01/14/2021   Spasm of muscle 06/29/2010   Status post intraocular lens implant 02/23/2012   Tobacco use 06/29/2010   Ulcer    stomach hx    Past Surgical History:  Procedure Laterality  Date   APPENDECTOMY     BREAST BIOPSY Right 2013   CATARACT EXTRACTION      right   DILATION AND CURETTAGE OF UTERUS     FOOT SURGERY Bilateral    hip si joint     injection/ablation   RETINAL DETACHMENT SURGERY  12/2010   right eye   TONSILLECTOMY     VIDEO BRONCHOSCOPY WITH ENDOBRONCHIAL NAVIGATION Right 08/01/2018   Procedure: VIDEO BRONCHOSCOPY WITH ENDOBRONCHIAL NAVIGATION Fiducial Placement;  Surgeon: Shelah Lamar RAMAN, MD;  Location: [facility] OR;  Service: Thoracic;  Laterality: Right;    Family Psychiatric History: ***  Family History:  Family History  Problem Relation Age of Onset   Dementia Mother    Hiatal hernia Father    Mental illness Maternal Aunt    Suicidality Maternal Aunt    Colon cancer Neg Hx    Esophageal cancer Neg Hx    Stomach cancer Neg Hx    Rectal cancer Neg Hx     Social History:   Academic/Vocational: *** Social History   Socioeconomic History   Marital status: Married    Spouse name: Not on file   Number of children: 2   Years of education: 13   Highest education level: Some college, no degree  Occupational History   Occupation: Retired  Tobacco Use   Smoking status: Every Day    Current packs/day: 0.25    Average packs/day: 0.3 packs/day for 40.0 years (10.0 ttl pk-yrs)    Types: Cigarettes   Smokeless tobacco: Never   Tobacco comments:    currently smoking 7-8 cigs per day  Vaping Use   Vaping status: Never Used  Substance and Sexual Activity   Alcohol use: Yes    Alcohol/week: 14.0 - 21.0 standard drinks of alcohol    Types: 14 - 21 Cans of beer per week    Comment: 2-3 beers daily   Drug use: No   Sexual activity: Never  Other Topics Concern   Not on file  Social History Narrative   Right handed   Drinks caffeine prn   Lives with husband   [place]   retired   Chief Executive Officer Drivers of Corporate investment banker Strain: Low Risk  (10/03/2023)   Overall Financial Resource Strain (CARDIA)    Difficulty of Paying Living Expenses: Not very hard  Food Insecurity: Low Risk  (03/12/2024)   Received from [facility]   Hunger Vital Sign    Within the past 12 months, you worried that your food would run out before you got money to buy more: Never true    Within the past 12 months, the food you bought just didn't last and you didn't have money to get more. : Never true  Transportation Needs: No Transportation Needs (03/12/2024)   Received from [facility]    In the past 12 months, has lack of reliable transportation kept you from medical appointments, meetings, work or from getting things needed for daily living? : No  Physical Activity: Inactive (09/25/2023)   Exercise Vital Sign    Days of Exercise per Week: 0 days    Minutes of Exercise per Session: 0 min  Stress: No Stress Concern Present (09/25/2023)   [employer] of Occupational Health - Occupational Stress Questionnaire    Feeling of Stress : Only a little  Social Connections: Unknown (09/25/2023)   Social Connection and Isolation Panel    Frequency of Communication with Friends and Family: More than  three times a week    Frequency of Social Gatherings with Friends and Family: Once a week    Attends Religious Services: Not on Marketing executive or Organizations: No    Attends Banker Meetings: Never    Marital Status: Married    Additional Social History: updated  Allergies:   Allergies  Allergen Reactions   Amoxicillin  Diarrhea and Itching    Current Medications: Current Outpatient Medications  Medication Sig Dispense Refill   albuterol  (VENTOLIN  HFA) 108 (90 Base) MCG/ACT inhaler 1 PUFF EVERY 8 HOURS AS NEEDED 18 each 10   albuterol  (VENTOLIN  HFA) 108 (90 Base) MCG/ACT inhaler TAKE 2 PUFFS BY MOUTH EVERY 6 HOURS AS NEEDED FOR WHEEZE OR SHORTNESS OF BREATH 18 each 6   Aspirin-Salicylamide-Caffeine (BC HEADACHE POWDER PO) Take 1 packet by mouth daily as needed (for pain or headache).      celecoxib  (CELEBREX ) 200 MG capsule TAKE 1 CAPSULE BY MOUTH EVERY DAY AS NEEDED 90 capsule 1    diphenoxylate -atropine  (LOMOTIL ) 2.5-0.025 MG tablet TAKE 1 TABLET BY MOUTH 4 (FOUR) TIMES DAILY AS NEEDED FOR DIARRHEA OR LOOSE STOOLS. 120 tablet 2   divalproex  (DEPAKOTE ) 125 MG DR tablet Take 1 tablet (125 mg total) by mouth at bedtime. 60 tablet 11   donepezil  (ARICEPT ) 10 MG tablet Take 1 tablet (10 mg total) by mouth daily. 90 tablet 3   Fluticasone-Umeclidin-Vilant (TRELEGY ELLIPTA ) 200-62.5-25 MCG/ACT AEPB Inhale 1 puff into the lungs daily. 3 each 3   ipratropium-albuterol  (DUONEB) 0.5-2.5 (3) MG/3ML SOLN Take 3 mLs by nebulization every 6 (six) hours as needed. Diagnosis code:  J44.1, J18.9 360 mL 3   mirtazapine  (REMERON ) 7.5 MG tablet TAKE 1 TABLET BY MOUTH EVERYDAY AT BEDTIME 90 tablet 0   mometasone  (NASONEX ) 50 MCG/ACT nasal spray PLACE 2 SPRAYS INTO THE NOSE DAILY. 51 each 4   Multiple Vitamin (MULITIVITAMIN WITH MINERALS) TABS Take 1 tablet by mouth daily.     Polyethyl Glycol-Propyl Glycol (SYSTANE OP) Place 1 drop into both eyes daily as needed (for dry eyes).     pregabalin  (LYRICA ) 25 MG capsule TAKE 1 CAPSULE BY MOUTH AT BEDTIME. 30 capsule 2   Current Facility-Administered Medications  Medication Dose Route Frequency Provider Last Rate Last Admin   ipratropium-albuterol  (DUONEB) 0.5-2.5 (3) MG/3ML nebulizer solution 3 mL  3 mL Nebulization Q4H PRN Mannam, Praveen, MD        ROS: Review of Systems ***  Objective:  Psychiatric Specialty Exam: There were no vitals taken for this visit.There is no height or weight on file to calculate BMI.  General Appearance: {Appearance:22683}  Eye Contact:  {BHH EYE CONTACT:22684}  Speech:  {Speech:22685}  Volume:  {Volume (PAA):22686}  Mood:  {BHH MOOD:22306}  Affect:  {Affect (PAA):22687}  Thought Content: {Thought Content:22690}   Suicidal Thoughts:  {ST/HT (PAA):22692}  Homicidal Thoughts:  {ST/HT (PAA):22692}  Thought Process:  {Thought Process (PAA):22688}  Orientation:  {BHH ORIENTATION (PAA):22689}    Memory: {BHH  MEMORY:22881}  Judgment:  {Judgement (PAA):22694}  Insight:  {Insight (PAA):22695}  Concentration:  {Concentration:21399}  Recall:  not formally assessed ***  Fund of Knowledge: {BHH GOOD/FAIR/POOR:22877}  Language: {BHH GOOD/FAIR/POOR:22877}  Psychomotor Activity:  {Psychomotor (PAA):22696}  Akathisia:  {BHH YES OR NO:22294}  AIMS (if indicated): {Desc; done/not:10129}  Assets:  {Assets (PAA):22698}  ADL's:  {BHH JIO'D:77709}  Cognition: {chl bhh cognition:304700322}  Sleep:  {BHH GOOD/FAIR/POOR:22877}   PE: General: well-appearing; no acute distress *** Pulm: no increased work  of breathing on room air *** Strength & Muscle Tone: {desc; muscle tone:32375} Neuro: no focal neurological deficits observed *** Gait & Station: {PE GAIT ED NATL:22525}  Metabolic Disorder Labs: No results found for: HGBA1C, MPG No results found for: PROLACTIN Lab Results  Component Value Date   CHOL 163 03/31/2021   TRIG 46.0 03/31/2021   HDL 73.60 03/31/2021   CHOLHDL 2 03/31/2021   VLDL 9.2 03/31/2021   LDLCALC 80 03/31/2021   LDLCALC 104 (H) 01/22/2018   Lab Results  Component Value Date   TSH 0.46 05/15/2023    Therapeutic Level Labs: No results found for: LITHIUM No results found for: CBMZ No results found for: VALPROATE  Screenings:  GAD-7    Flowsheet Row Office Visit from 07/08/2020 in [facility] [place]  Total GAD-7 Score 0   PHQ2-9    Flowsheet Row Care Coordination from 11/23/2023 in [facility] Care Coordination Clinical Support from 09/25/2023 in [facility] [employer] Video Visit from 05/23/2023 in [facility] Visit from 05/09/2023 in [facility] Visit from 09/22/2022 in [facility] [employer]  PHQ-2 Total Score 2 0 0 0 0  PHQ-9 Total Score 7 -- -- -- --     Collaboration of Care: Collaboration of Care: {BH OP Collaboration of Care:21014065}  Patient/Guardian was advised Release of Information must be obtained prior to any record release in order to collaborate their care with an outside provider. Patient/Guardian was advised if they have not already done so to contact the registration department to sign all necessary forms in order for us  to release information regarding their care.   Consent: Patient/Guardian gives verbal consent for treatment and assignment of benefits for services provided during this visit. Patient/Guardian expressed understanding and agreed to proceed.   Yakira Duquette, MD 7/8/202511:09 AM

## 2024-06-12 ENCOUNTER — Telehealth: Payer: Self-pay

## 2024-06-12 NOTE — Progress Notes (Signed)
 Complex Care Management Note Care Guide Note  06/12/2024 Name: Lindsey Rowland MRN: [id number] DOB: 15-May-1951   Complex Care Management Outreach Attempts: An unsuccessful telephone outreach was attempted today to offer the patient information about available complex care management services.  Follow Up Plan:  Additional outreach attempts will be made to offer the patient complex care management information and services.   Encounter Outcome:  No Answer  [name] [facility]  [facility], [facility] Health Care Management Assistant Direct Dial: [phone]  Fax: [phone]

## 2024-06-14 NOTE — Progress Notes (Unsigned)
 Complex Care Management Note Care Guide Note  06/14/2024 Name: Lindsey Rowland MRN: [id number] DOB: [birth year]   Complex Care Management Outreach Attempts: A second unsuccessful outreach was attempted today to offer the patient with information about available complex care management services.  Follow Up Plan:  Additional outreach attempts will be made to offer the patient complex care management information and services.   Encounter Outcome:  No Answer  [name] [facility]  [facility], [facility] Health Care Management Assistant Direct Dial: [phone]  Fax: [phone]

## 2024-06-15 ENCOUNTER — Other Ambulatory Visit: Payer: Self-pay | Admitting: Family Medicine

## 2024-06-15 DIAGNOSIS — G2581 Restless legs syndrome: Secondary | ICD-10-CM

## 2024-06-17 ENCOUNTER — Ambulatory Visit (HOSPITAL_BASED_OUTPATIENT_CLINIC_OR_DEPARTMENT_OTHER): Payer: Self-pay

## 2024-06-17 ENCOUNTER — Encounter (HOSPITAL_COMMUNITY): Payer: Self-pay

## 2024-06-17 VITALS — BP 147/76 | HR 67 | Ht 65.0 in | Wt 84.0 lb

## 2024-06-17 DIAGNOSIS — R630 Anorexia: Secondary | ICD-10-CM | POA: Insufficient documentation

## 2024-06-17 DIAGNOSIS — G3184 Mild cognitive impairment, so stated: Secondary | ICD-10-CM

## 2024-06-17 DIAGNOSIS — G4709 Other insomnia: Secondary | ICD-10-CM

## 2024-06-17 MED ORDER — MIRTAZAPINE 15 MG PO TABS: 15.0000 mg | ORAL_TABLET | Freq: Every day | ORAL | 0 refills | Status: DC

## 2024-06-17 NOTE — Patient Instructions (Signed)
 Thank you for attending your appointment today.  -- Continue taking Depakote  125 mg as prescribed, Lyrica  that is being managed with your neurologist. -- Start taking Remeron  15 mg nightly, and increase from 7.5 mg for appetite/sleep/mood. --Please call the clinic if you have any side effects or issues with taking the medicines as prescribed  Please do not make any changes to medications without first discussing with your provider. If you are experiencing a psychiatric emergency, please call 911 or present to your nearest emergency department. Additional crisis, medication management, and therapy resources are included below.  [facility]  [address] [place], [place] [address] [phone] or [phone] [facility] 24/7 FOR ANYONE [address] [place], [place]  [phone] Fax: [phone] [email or web address] *Interpreters available *Accepts all insurance and uninsured for Urgent Care needs

## 2024-06-17 NOTE — Progress Notes (Unsigned)
 Complex Care Management Note Care Guide Note  06/17/2024 Name: Lindsey Rowland MRN: [id number] DOB: 07-30-1951   Complex Care Management Outreach Attempts: A third unsuccessful outreach was attempted today to offer the patient with information about available complex care management services.  Follow Up Plan:  Additional outreach attempts will be made to offer the patient complex care management information and services.   Encounter Outcome:  Patient Request to Call Back  [name] [facility]  [facility], [facility] Health Care Management Assistant Direct Dial: [phone]  Fax: [phone]

## 2024-06-18 NOTE — Addendum Note (Signed)
 Addended by: CARVIN CROCK on: 06/18/2024 09:25 AM   Modules accepted: Level of Service

## 2024-06-20 NOTE — Progress Notes (Signed)
 Complex Care Management Note  Care Guide Note 06/20/2024 Name: Lindsey Rowland MRN: [id number] DOB: [birth year]  Lindsey Rowland is a 73 y.o. year old female who sees Berneta Elsie Sayre, MD for primary care. I reached out to Heron MARLA [place] by phone today to offer complex care management services.  [name] was given information about Complex Care Management services today including:   The Complex Care Management services include support from the care team which includes your Nurse Care Manager, Clinical Social Worker, or Pharmacist.  The Complex Care Management team is here to help remove barriers to the health concerns and goals most important to you. Complex Care Management services are voluntary, and the patient may decline or stop services at any time by request to their care team member.   Complex Care Management Consent Status: Patient agreed to services and verbal consent obtained.   Follow up plan:  Telephone appointment with complex care management team member scheduled for:  06/27/24 at 2:00 p.m.   Encounter Outcome:  Patient Scheduled  [name] [facility]  [facility], [facility] Health Care Management Assistant Direct Dial: [phone]  Fax: [phone]

## 2024-06-24 DIAGNOSIS — J449 Chronic obstructive pulmonary disease, unspecified: Secondary | ICD-10-CM | POA: Diagnosis not present

## 2024-06-27 ENCOUNTER — Other Ambulatory Visit: Payer: Self-pay | Admitting: Licensed Clinical Social Worker

## 2024-06-27 ENCOUNTER — Encounter: Payer: Self-pay | Admitting: Licensed Clinical Social Worker

## 2024-06-28 ENCOUNTER — Other Ambulatory Visit: Payer: Self-pay | Admitting: Licensed Clinical Social Worker

## 2024-06-28 NOTE — Patient Outreach (Signed)
 Complex Care Management   Visit Note  06/27/2024  Name:  Lindsey Rowland MRN: [id number] DOB: [birth year]  Situation: Referral received for Complex Care Management related to level of care and safety concerns. I obtained verbal consent from HCPOA, Kersten-daughter.  Visit completed over the phone with VBCI LCSW. APS report was successfully filed on 06/28/24 with [facility] PCP updated.  Background:   Past Medical History:  Diagnosis Date   Actinic keratoses 01/14/2021   Allergic rhinitis 12/10/2008   Arthropathy of ankle and foot 06/10/2009   Chronic obstructive pulmonary disease 07/25/2007   Coronary artery calcification 01/22/2018   Diverticulitis of colon (without mention of hemorrhage) 07/15/2008   Elevated alcohol use    Epiretinal membrane 05/08/2011   Gastroesophageal reflux disease 11/08/2021   Generalized anxiety disorder 10/16/2007   Headache    History of poliomyelitis    History of radiation therapy 09/10/2018-09/17/2018   SBRT 3 fx to right lung; Dr. Lynwood Nasuti   Hypothyroidism 06/09/2023   Intercostal pain 05/04/2021   Irritable bowel syndrome 03/09/2010   Lentigo 01/14/2021   Malignant neoplasm of unspecified part of unspecified bronchus or lung 06/09/2023   Mass of chest wall, right 05/04/2021   Mild cognitive impairment with concerns for Alzheimer's disease 10/13/2023   Mild protein-calorie malnutrition 06/09/2023   Non-small cell cancer of right lung 04/16/2018   Nuclear cataract 02/23/2012   Osteoarthritis 12/10/2008   Osteopenia    Restless legs syndrome 01/20/2017   Retinal detachment of right eye with retinal break 02/23/2012   Seborrheic keratoses 01/14/2021   Spasm of muscle 06/29/2010   Status post intraocular lens implant 02/23/2012   Tobacco use 06/29/2010   Ulcer    stomach hx    Assessment: Patient Reported Symptoms:  Cognitive Cognitive Status: Struggling with memory recall, Poor judgment in daily scenarios Cognitive/Intellectual  Conditions Management [RPT]: Behavior Disorders Behavior Disorders: Dementia   Health Maintenance Behaviors: Annual physical exam Healing Pattern: Average Health Facilitated by: Rest, Stress management  Neurological Neurological Review of Symptoms: Other: [place] Neurological Symptoms/Conditions [RPT]: Increased concerns for safety due to cognition per HCPOA Neurological Management Strategies: Routine screening Neurological Self-Management Outcome: 3 (uncertain)  HEENT HEENT Symptoms Reported: No symptoms reported      Cardiovascular Cardiovascular Symptoms Reported: No symptoms reported    Respiratory Respiratory Symptoms Reported: Shortness of breath Other Respiratory Symptoms: Pt is on oxygen Respiratory Management Strategies: Oxygen therapy  Endocrine Endocrine Symptoms Reported: No symptoms reported    Gastrointestinal Gastrointestinal Symptoms Reported: No symptoms reported      Genitourinary Genitourinary Symptoms Reported: No symptoms reported    Integumentary Integumentary Symptoms Reported: No symptoms reported    Musculoskeletal Musculoskelatal Symptoms Reviewed: No symptoms reported        Psychosocial Psychosocial Symptoms Reported: Irritability, Other Other Psychosocial Conditions: Increased safety concerns regarding dementia. Daughter denied any SI/HI and was educated extensively on crisis resources within the area if needed to utilize. Behavioral Management Strategies: Medication therapy, Coping strategies Behavioral Health Self-Management Outcome: 2 (bad) Major Change/Loss/Stressor/Fears (CP): Medical condition, self, Resources Techniques to [employer] with Loss/Stress/Change: Medication Quality of Family Relationships: stressful, involved Do you feel physically threatened by others?: No (PER HCPOA)      06/27/2024    2:26 PM  Depression screen PHQ 2/9  Decreased Interest 0  Down, Depressed, Hopeless 0  PHQ - 2 Score 0   SDOH Screenings   Food Insecurity: Food  Insecurity Present (06/27/2024)  Housing: Low Risk  (06/27/2024)  Transportation Needs: No Transportation Needs (06/27/2024)  Utilities: Not At Risk (06/27/2024)  Alcohol Screen: Medium Risk (06/27/2024)  Depression (PHQ2-9): Low Risk  (06/27/2024)  Financial Resource Strain: Medium Risk (06/27/2024)  Physical Activity: Inactive (09/25/2023)  Social Connections: Unknown (09/25/2023)  Stress: Stress Concern Present (06/27/2024)  Tobacco Use: High Risk (06/27/2024)  Health Literacy: Adequate Health Literacy (09/25/2023)   There were no vitals filed for this visit.  Medications Reviewed Today     Reviewed by Merlynn Lyle CROME, LCSW (Social Worker) on 06/27/24 at 1526  Med List Status: <None>   Medication Order Taking? Sig Documenting Provider Last Dose Status Informant  albuterol  (VENTOLIN  HFA) 108 (90 Base) MCG/ACT inhaler [id number] No 1 PUFF EVERY 8 HOURS AS NEEDED Mannam, Praveen, MD Taking Active   albuterol  (VENTOLIN  HFA) 108 (90 Base) MCG/ACT inhaler [id number] No TAKE 2 PUFFS BY MOUTH EVERY 6 HOURS AS NEEDED FOR WHEEZE OR SHORTNESS OF BREATH Mannam, Praveen, MD Taking Active   Aspirin-Salicylamide-Caffeine (BC HEADACHE POWDER PO) [id number] No Take 1 packet by mouth daily as needed (for pain or headache).  [provider] Taking Active Self  celecoxib  (CELEBREX ) 200 MG capsule [id number]  TAKE 1 CAPSULE BY MOUTH EVERY DAY AS NEEDED Berneta Elsie Sayre, MD  Active   diphenoxylate -atropine  (LOMOTIL ) 2.5-0.025 MG tablet [id number] No TAKE 1 TABLET BY MOUTH 4 (FOUR) TIMES DAILY AS NEEDED FOR DIARRHEA OR LOOSE STOOLS. Berneta Elsie Sayre, MD Taking Active   divalproex  (DEPAKOTE ) 125 MG DR tablet [id number]  Take 1 tablet (125 mg total) by mouth at bedtime. Wertman, Sara E, PA-C  Active   donepezil  (ARICEPT ) 10 MG tablet [id number]  Take 1 tablet (10 mg total) by mouth daily. Wertman, Sara E, PA-C  Active   Fluticasone-Umeclidin-Vilant (TRELEGY ELLIPTA ) 200-62.5-25 MCG/ACT AEPB [id number] No  Inhale 1 puff into the lungs daily. Mannam, Praveen, MD Taking Active   ipratropium-albuterol  (DUONEB) 0.5-2.5 (3) MG/3ML nebulizer solution 3 mL [id number]   Mannam, Praveen, MD  Active   ipratropium-albuterol  (DUONEB) 0.5-2.5 (3) MG/3ML SOLN [id number] No Take 3 mLs by nebulization every 6 (six) hours as needed. Diagnosis code:  J44.1, J18.9 Mannam, Praveen, MD Taking Active   mirtazapine  (REMERON ) 15 MG tablet [id number]  Take 1 tablet (15 mg total) by mouth at bedtime. Kapoor, Sahil, MD  Active   mometasone  (NASONEX ) 50 MCG/ACT nasal spray [id number] No PLACE 2 SPRAYS INTO THE NOSE DAILY. Berneta Elsie Sayre, MD Taking Active   Multiple Vitamin [facility] WITH MINERALS) TABS [id number] No Take 1 tablet by mouth daily. [provider] Taking Active Self  Polyethyl Glycol-Propyl Glycol (SYSTANE OP) [id number] No Place 1 drop into both eyes daily as needed (for dry eyes). [provider] Taking Active Self  pregabalin  (LYRICA ) 25 MG capsule [id number]  TAKE 1 CAPSULE BY MOUTH EVERYDAY AT BEDTIME Berneta Elsie Sayre, MD  Active             Recommendation:   Referral to: APS  Follow Up Plan:   Telephone follow-up in 1 month  Lyle Merlynn, BSW, MSW, LCSW Licensed Clinical Social Worker [employer]   [facility] [email or web address] Direct Dial: [phone]

## 2024-06-28 NOTE — Patient Outreach (Addendum)
 Complex Care Management   Visit Note  06/27/2024  Name:  Lindsey Rowland MRN: [id number] DOB: 04-18-1951  Situation: Referral received for Complex Care Management related to safety and level of care concerns. I obtained verbal consent from Amelia, daughter Maurice.  Visit completed with HCPOA and VBIC LCSW  on the phone  Background:   Past Medical History:  Diagnosis Date   Actinic keratoses 01/14/2021   Allergic rhinitis 12/10/2008   Arthropathy of ankle and foot 06/10/2009   Chronic obstructive pulmonary disease 07/25/2007   Coronary artery calcification 01/22/2018   Diverticulitis of colon (without mention of hemorrhage) 07/15/2008   Elevated alcohol use    Epiretinal membrane 05/08/2011   Gastroesophageal reflux disease 11/08/2021   Generalized anxiety disorder 10/16/2007   Headache    History of poliomyelitis    History of radiation therapy 09/10/2018-09/17/2018   SBRT 3 fx to right lung; Dr. Lynwood Nasuti   Hypothyroidism 06/09/2023   Intercostal pain 05/04/2021   Irritable bowel syndrome 03/09/2010   Lentigo 01/14/2021   Malignant neoplasm of unspecified part of unspecified bronchus or lung 06/09/2023   Mass of chest wall, right 05/04/2021   Mild cognitive impairment with concerns for Alzheimer's disease 10/13/2023   Mild protein-calorie malnutrition 06/09/2023   Non-small cell cancer of right lung 04/16/2018   Nuclear cataract 02/23/2012   Osteoarthritis 12/10/2008   Osteopenia    Restless legs syndrome 01/20/2017   Retinal detachment of right eye with retinal break 02/23/2012   Seborrheic keratoses 01/14/2021   Spasm of muscle 06/29/2010   Status post intraocular lens implant 02/23/2012   Tobacco use 06/29/2010   Ulcer    stomach hx    Cognitive Cognitive Status: Struggling with memory recall, Poor judgment in daily scenarios Cognitive/Intellectual Conditions Management [RPT]: Behavior Disorders Behavior Disorders: Dementia   Health Maintenance Behaviors:  Annual physical exam Healing Pattern: Average Health Facilitated by: Rest, Stress management  Neurological Neurological Review of Symptoms: Other: [place] Neurological Symptoms/Conditions [RPT]: Increased concerns for safety due to cognition per HCPOA Neurological Management Strategies: Routine screening Neurological Self-Management Outcome: 3 (uncertain)  HEENT HEENT Symptoms Reported: No symptoms reported      Cardiovascular Cardiovascular Symptoms Reported: No symptoms reported    Respiratory Respiratory Symptoms Reported: Shortness of breath Other Respiratory Symptoms: Pt is on oxygen Respiratory Management Strategies: Oxygen therapy  Endocrine Endocrine Symptoms Reported: No symptoms reported    Gastrointestinal Gastrointestinal Symptoms Reported: No symptoms reported      Genitourinary Genitourinary Symptoms Reported: No symptoms reported    Integumentary Integumentary Symptoms Reported: No symptoms reported    Musculoskeletal Musculoskelatal Symptoms Reviewed: No symptoms reported        Psychosocial Psychosocial Symptoms Reported: Irritability, Other Other Psychosocial Conditions: Increased safety concerns regarding dementia No SI/HI at this time. Crisis resource education provided to family and they were strongly encouraged to utilize these in case of a crisis.  Behavioral Management Strategies: Medication therapy, Coping strategies Behavioral Health Self-Management Outcome: 2 (bad) Major Change/Loss/Stressor/Fears (CP): Medical condition, self, Resources Techniques to [employer] with Loss/Stress/Change: Medication Quality of Family Relationships: stressful, involved Do you feel physically threatened by others?: No (PER HCPOA)      06/27/2024    2:26 PM  Depression screen PHQ 2/9  Decreased Interest 0  Down, Depressed, Hopeless 0  PHQ - 2 Score 0   SDOH Screenings   Food Insecurity: Food Insecurity Present (06/27/2024)  Housing: Low Risk  (06/27/2024)  Transportation  Needs: No Transportation Needs (06/27/2024)  Utilities: Not At Risk (06/27/2024)  Alcohol Screen: Medium Risk (06/27/2024)  Depression (PHQ2-9): Low Risk  (06/27/2024)  Financial Resource Strain: Medium Risk (06/27/2024)  Physical Activity: Inactive (09/25/2023)  Social Connections: Unknown (09/25/2023)  Stress: Stress Concern Present (06/27/2024)  Tobacco Use: High Risk (06/27/2024)  Health Literacy: Adequate Health Literacy (09/25/2023)   There were no vitals filed for this visit.  Medications Reviewed Today     Reviewed by Merlynn Lyle CROME, LCSW (Social Worker) on 06/27/24 at 1526  Med List Status: <None>   Medication Order Taking? Sig Documenting Provider Last Dose Status Informant  albuterol  (VENTOLIN  HFA) 108 (90 Base) MCG/ACT inhaler [id number] No 1 PUFF EVERY 8 HOURS AS NEEDED Mannam, Praveen, MD Taking Active   albuterol  (VENTOLIN  HFA) 108 (90 Base) MCG/ACT inhaler [id number] No TAKE 2 PUFFS BY MOUTH EVERY 6 HOURS AS NEEDED FOR WHEEZE OR SHORTNESS OF BREATH Mannam, Praveen, MD Taking Active   Aspirin-Salicylamide-Caffeine (BC HEADACHE POWDER PO) [id number] No Take 1 packet by mouth daily as needed (for pain or headache).  [provider] Taking Active Self  celecoxib  (CELEBREX ) 200 MG capsule [id number]  TAKE 1 CAPSULE BY MOUTH EVERY DAY AS NEEDED Berneta Elsie Sayre, MD  Active   diphenoxylate -atropine  (LOMOTIL ) 2.5-0.025 MG tablet [id number] No TAKE 1 TABLET BY MOUTH 4 (FOUR) TIMES DAILY AS NEEDED FOR DIARRHEA OR LOOSE STOOLS. Berneta Elsie Sayre, MD Taking Active   divalproex  (DEPAKOTE ) 125 MG DR tablet [id number]  Take 1 tablet (125 mg total) by mouth at bedtime. Wertman, Sara E, PA-C  Active   donepezil  (ARICEPT ) 10 MG tablet [id number]  Take 1 tablet (10 mg total) by mouth daily. Wertman, Sara E, PA-C  Active   Fluticasone-Umeclidin-Vilant (TRELEGY ELLIPTA ) 200-62.5-25 MCG/ACT AEPB [id number] No Inhale 1 puff into the lungs daily. Mannam, Praveen, MD Taking Active    ipratropium-albuterol  (DUONEB) 0.5-2.5 (3) MG/3ML nebulizer solution 3 mL [id number]   Mannam, Praveen, MD  Active   ipratropium-albuterol  (DUONEB) 0.5-2.5 (3) MG/3ML SOLN [id number] No Take 3 mLs by nebulization every 6 (six) hours as needed. Diagnosis code:  J44.1, J18.9 Mannam, Praveen, MD Taking Active   mirtazapine  (REMERON ) 15 MG tablet [id number]  Take 1 tablet (15 mg total) by mouth at bedtime. Kapoor, Sahil, MD  Active   mometasone  (NASONEX ) 50 MCG/ACT nasal spray [id number] No PLACE 2 SPRAYS INTO THE NOSE DAILY. Berneta Elsie Sayre, MD Taking Active   Multiple Vitamin [facility] WITH MINERALS) TABS [id number] No Take 1 tablet by mouth daily. [provider] Taking Active Self  Polyethyl Glycol-Propyl Glycol (SYSTANE OP) [id number] No Place 1 drop into both eyes daily as needed (for dry eyes). [provider] Taking Active Self  pregabalin  (LYRICA ) 25 MG capsule [id number]  TAKE 1 CAPSULE BY MOUTH EVERYDAY AT BEDTIME Berneta Elsie Sayre, MD  Active             Recommendation:   PCP Follow-up BSW call on 07/04/24  Follow Up Plan:   Telephone follow-up in 1 month  Lyle Merlynn, BSW, MSW, [employer] Licensed Clinical Social Worker [employer] [facility]   [facility] [email or web address] Direct Dial: [phone]

## 2024-06-28 NOTE — Patient Instructions (Signed)
 Visit Information  Thank you for taking time to visit with me today. Please don't hesitate to contact me if I can be of assistance to you before our next scheduled appointment.  Our next appointment is by telephone on 07/12/24 at 2 pm Please call the care guide team at [phone] if you need to cancel or reschedule your appointment.   Following is a copy of your care plan:   Goals Addressed             This Visit's Progress    LCSW-VBCI Social Work Care Plan       Problems:   Care Coordination needs related to Dementia: Alzheimer's dementia, Level of Care Concerns:Memory Deficits, Malnutrition concerns and Substance Misuse: alcohol  CSW Clinical Goal(s):   Over the next 90 days the Patient will attend all scheduled medical appointments as evidenced by patient report and care team review of appointment completion in electronic MEDICAL RECORD NUMBERby VBCI LCSW. Family/patient will explore community resource options for unmet needs related to Alcohol/Substance Use, Food Insecurity , and Caregiver strain as evidenced by resources provided. Family will work with [employer] SW team, care team and APS to address needs related to safety.  Interventions:  Level of Care Concerns in a patient with Dementia Current level of care: Home with other family or significant other(s): spouse  Evaluation of patient's unmet needs in current living environment Assessed needs, level of care concerns, how currently meeting needs and barriers to care Solution-Focused Strategies employed: Active listening / Reflection utilized Mining engineer reviewed Caregiver stress acknowledged :Daughters involved in care Consideration on in-home help encouraged : options discussed Financial risk analyst / information provided Depression screen reviewed Emotional Support Provided PHQ2/PHQ9 completed Solution-Focued Strategies employed: Suicidal Ideation/Homicidal Ideation assessed: No SI/HI BSW referral placed for  [employer] support and SDOH needs [facility] APS report will be filed by [employer]    Patient Goals/Self-Care Activities:  Increase coping skills, healthy habits, self-management skills, and stress reduction Keep all upcoming appointment discussed today Continue with compliance of taking medication prescribed by Doctor Consider the suggestions made for safety, home modifications, family meeting,etc Utilize your [employer] caregiver hours before the end of year if needed for home companion/caregiver support Academic librarian resources about private Pension scheme manager self care opportunities 2-3 times a week  Referral to [employer] for home care - still on waiting list Drink Premier Protein drink - reports she drinks two a day and one big meal at the end of the day Increase number of grocery items daughter will have delivered to home  Plan:   The care management team will reach out to the patient again over the next 30 days.        Please call the Suicide and Crisis Lifeline: 988 call the [place]  National Suicide Prevention Lifeline: (334)410-3257 or TTY: 867-340-8250 TTY (458)111-3406) to talk to a trained counselor call 1-800-273-TALK (toll free, 24 hour hotline) go to [facility] [address] [place] [phone]) if you are experiencing a Mental Health or Behavioral Health Crisis or need someone to talk to.  Patient verbalizes understanding of instructions and care plan provided today and agrees to view in MyChart. Active MyChart status and patient understanding of how to access instructions and care plan via MyChart confirmed with patient.     Lyle Rung, BSW, MSW, LCSW Licensed Clinical Social Worker [employer] [facility]   [facility] [email or web address] Direct Dial: [phone]

## 2024-06-28 NOTE — Patient Outreach (Addendum)
 Complex Care Management   Visit Note  06/28/2024  Name:  Lindsey Rowland MRN: [id number] DOB: 23-May-1951  Situation: Referral received for Complex Care Management related to safety and level of care concerns. [facility] [employer] call completed and a report was successfully placed.  Background:   Past Medical History:  Diagnosis Date   Actinic keratoses 01/14/2021   Allergic rhinitis 12/10/2008   Arthropathy of ankle and foot 06/10/2009   Chronic obstructive pulmonary disease 07/25/2007   Coronary artery calcification 01/22/2018   Diverticulitis of colon (without mention of hemorrhage) 07/15/2008   Elevated alcohol use    Epiretinal membrane 05/08/2011   Gastroesophageal reflux disease 11/08/2021   Generalized anxiety disorder 10/16/2007   Headache    History of poliomyelitis    History of radiation therapy 09/10/2018-09/17/2018   SBRT 3 fx to right lung; Dr. Lynwood Nasuti   Hypothyroidism 06/09/2023   Intercostal pain 05/04/2021   Irritable bowel syndrome 03/09/2010   Lentigo 01/14/2021   Malignant neoplasm of unspecified part of unspecified bronchus or lung 06/09/2023   Mass of chest wall, right 05/04/2021   Mild cognitive impairment with concerns for Alzheimer's disease 10/13/2023   Mild protein-calorie malnutrition 06/09/2023   Non-small cell cancer of right lung 04/16/2018   Nuclear cataract 02/23/2012   Osteoarthritis 12/10/2008   Osteopenia    Restless legs syndrome 01/20/2017   Retinal detachment of right eye with retinal break 02/23/2012   Seborrheic keratoses 01/14/2021   Spasm of muscle 06/29/2010   Status post intraocular lens implant 02/23/2012   Tobacco use 06/29/2010   Ulcer    stomach hx   Follow up Plan: Referral to: APS, PCP notified  Lyle Rung, BSW, MSW, LCSW Licensed Clinical Social Worker [place]   [facility] [email or web address] Direct Dial: [phone]

## 2024-07-01 ENCOUNTER — Encounter: Payer: Self-pay | Admitting: Licensed Clinical Social Worker

## 2024-07-01 NOTE — Patient Outreach (Signed)
 Complex Care Management   Visit Note  07/01/2024  Name:  TRINITA DEVLIN MRN: [id number] DOB: Mar 19, 1951  Situation: Referral received for Complex Care Management related to safety and level of care concerns. VBCI BSW and LCSW discussed case together today. VBCI LCSW received the following update from [facility] APS:  We appreciate the concern you have shown for the above-named person by reporting the possible need for protective services.  In order for a referral to qualify as an Adult Protective Services (APS) referral,   Law [place]  108[place] requires that it meet three (3) separate criteria:  1. The adult must be disabled. 2. The allegations must indicate the adult is being abused, neglected, and/or exploited. 3. The adult must be in need of Protective Services at the current time. An APS evaluation will not be completed, as your referral does not meet all these criteria because:    While the concerns are legitimate, there appears to be no neglect of an acute nature and the adult is not disabled.  However, an outreach visit will be conducted to assess for the need for APS intervention.      Thank you again for your concern.   Sincerely, Selinda Reef  Johann Jurist Adult Protective Services Supervisor  Follow Up Plan:   DESIREE ROBINS and PCP updated by email  Lyle Rung, BSW, MSW, LCSW Licensed Clinical Social Worker [place]   [email or web address] Direct Dial: [phone]

## 2024-07-03 NOTE — Progress Notes (Signed)
 Psychiatric Follow Up  Patient Identification: Lindsey Rowland MRN:  [id number] Date of Evaluation:  07/15/2024 Referral Source: Neurology  Assessment:  Lindsey Rowland is a 73 y.o. female with a history of insomnia, MNCD, alcohol use disorder, reported depression, insomnia, reduced appetite who presents in person to [facility] for initial evaluation of behavioral problems/memory issues.  Patient is being followed up by neurology, there are issues that she had been off donepezil  and Depakote  for mood stabilization which was restarted in May 2025.  She has continued to drink 3 beers a day and smoke which is likely contributing to her memory issues.   Patient has continued to improve since her last visit, is denying any acute distress, denying SI/HI/AVH, depression, anxiety or insomnia.  She reported that she has been sleeping better and also reported that her appetite has improved a little bit on the current dose of Remeron .  She is denying any side effects from the medications or any physical concerns.  Her memory has stayed the same from her last visit, sometimes forgetting the recent events.  She has stayed compliant with her medications, her daughter helps her to keep her record of the medications.  Her granddaughter is helping her during the day.  She also continues to smoke 4 to 5 cigarettes a day and drink a beer a day, encouraged abstinence.  She has continued to drive to the nearest [employer] and to get some food within the range of 5 miles, encouraged to stop driving and seek assistance.  No safety issues reported. Discussed about continuing the same medication regimen and monitor for side effects.  Encouraged compliance on all her medications.  Prescriptions were sent to the preferred pharmacy, return to the clinic in 2 months  Risk Assessment: An assessment of suicide and violence risk factors was performed as part of this evaluation and is not  significantly  changed from the last visit. While future psychiatric events cannot be accurately predicted, the patient does not  currently require acute inpatient psychiatric care and does not  currently meet [place]  involuntary commitment criteria. Patient was given contact information for crisis resources, behavioral health clinic and was instructed to call 911 for emergencies   Plan:  # MNCD Past medication trials: Donepezil , Depakote  Status of problem: Chronic Interventions: -- Patient is on donepezil  10 mg daily, encouraged compliance, managed by neurologist -- Continue Depakote  125 mg at bedtime for mood, managed by her neurologist -- Continue mirtazapine  to 15 mg nightly -- Encouraged to stop drinking alcohol and quit smoking  # Anorexia Past medication trials: Mirtazapine  Status of problem: Chronic/current Interventions: -- Continue mirtazapine  to 15 mg nightly  # Insomnia Past medication trials: Mirtazapine  Status of problem: Current Interventions: -- Continue mirtazapine  to 15 mg nightly -- Encouraged sleep hygiene -- Encouraged to stop drinking alcohol and quit smoking  Return to care in 2 months  Patient was given contact information for behavioral health clinic and was instructed to call 911 for emergencies.    Patient and plan of care will be discussed with the Attending MD ,Dr. Carvin, who agrees with the above statement and plan.   Subjective:  Chief Complaint:  Chief Complaint  Patient presents with   Follow-up   MNCD    History of Present Illness:   Lindsey Rowland is a 73 y.o. female with a history of insomnia, MNCD, alcohol use disorder, reported depression, insomnia, reduced appetite who presents in person to [facility] for  follow-up of behavioral problems/memory issues.  Patient is being followed up by neurology, there are issues that she had been off donepezil  and Depakote  for mood stabilization which was restarted in May 2025.  She  has continued to drink 3 beers a day and smoke which is likely contributing to her memory issues.   Today, patient reports her mood as good .  She denied SI/HI/AVH, was alert and oriented x 3 and was not in acute distress.  When asked about her changes from the previous visit she stated it has been the same .  She reported that her appetite is a little bit better from the previous visit.  She reported good sleep, denied any nightmares or frequent awakenings, denied feeling depressed, anxious.  She was evaluated along with her daughter who reported that things have been the same .  She has denied any episodes of agitation recently.  She has remained compliant with her medications, denies any side effects or any physical concerns.  She continues to drink 1 beer a day which was reduced from the previous visit, smokes 4 to 5 cigarettes a day, encouraged to stop smoking.  She denies the use of any other substances.  She is continued to drive about 5 miles a day for groceries and food, encouraged to stop driving and seek assistance from her daughter.  There were no safety issues. Discussed about continuing the same medication regimen and monitor for side effects.  Encouraged compliance on all her medications.  Prescriptions were sent to the preferred pharmacy, return to the clinic in 2 months.  Past Psychiatric History:  Diagnoses: insomnia, MNCD, alcohol use disorder, reported depression, insomnia, reduced appetite  Medication trials: Depakote , donepezil , Remeron  Previous psychiatrist/therapist: None Hospitalizations: None Suicide attempts: None SIB: Not available Hx of violence towards others: None Current access to guns: Denies Hx of trauma/abuse: Denies  Substance Abuse History in the last 12 months:  No.  Past Medical History:  Past Medical History:  Diagnosis Date   Actinic keratoses 01/14/2021   Allergic rhinitis 12/10/2008   Arthropathy of ankle and foot 06/10/2009   Chronic  obstructive pulmonary disease 07/25/2007   Coronary artery calcification 01/22/2018   Diverticulitis of colon (without mention of hemorrhage) 07/15/2008   Elevated alcohol use    Epiretinal membrane 05/08/2011   Gastroesophageal reflux disease 11/08/2021   Generalized anxiety disorder 10/16/2007   Headache    History of poliomyelitis    History of radiation therapy 09/10/2018-09/17/2018   SBRT 3 fx to right lung; Dr. Lynwood Nasuti   Hypothyroidism 06/09/2023   Intercostal pain 05/04/2021   Irritable bowel syndrome 03/09/2010   Lentigo 01/14/2021   Malignant neoplasm of unspecified part of unspecified bronchus or lung 06/09/2023   Mass of chest wall, right 05/04/2021   Mild cognitive impairment with concerns for Alzheimer's disease 10/13/2023   Mild protein-calorie malnutrition 06/09/2023   Non-small cell cancer of right lung 04/16/2018   Nuclear cataract 02/23/2012   Osteoarthritis 12/10/2008   Osteopenia    Restless legs syndrome 01/20/2017   Retinal detachment of right eye with retinal break 02/23/2012   Seborrheic keratoses 01/14/2021   Spasm of muscle 06/29/2010   Status post intraocular lens implant 02/23/2012   Tobacco use 06/29/2010   Ulcer    stomach hx    Past Surgical History:  Procedure Laterality Date   APPENDECTOMY     BREAST BIOPSY Right 2013   CATARACT EXTRACTION     right   DILATION AND CURETTAGE OF UTERUS  FOOT SURGERY Bilateral    hip si joint     injection/ablation   RETINAL DETACHMENT SURGERY  12/2010   right eye   TONSILLECTOMY     VIDEO BRONCHOSCOPY WITH ENDOBRONCHIAL NAVIGATION Right 08/01/2018   Procedure: VIDEO BRONCHOSCOPY WITH ENDOBRONCHIAL NAVIGATION Fiducial Placement;  Surgeon: Shelah Lamar RAMAN, MD;  Location: [facility] OR;  Service: Thoracic;  Laterality: Right;    Family Psychiatric History: Alzheimer's Disease in mother/unable to describe the psychiatric symptoms  Family History:  Family History  Problem Relation Age of Onset   Dementia  Mother    Hiatal hernia Father    Mental illness Maternal Aunt    Suicidality Maternal Aunt    Colon cancer Neg Hx    Esophageal cancer Neg Hx    Stomach cancer Neg Hx    Rectal cancer Neg Hx     Social History:   Academic/Vocational: None Social History   Socioeconomic History   Marital status: Married    Spouse name: Not on file   Number of children: 2   Years of education: 13   Highest education level: Some college, no degree  Occupational History   Occupation: Retired  Tobacco Use   Smoking status: Every Day    Current packs/day: 0.25    Average packs/day: 0.3 packs/day for 40.0 years (10.0 ttl pk-yrs)    Types: Cigarettes   Smokeless tobacco: Never   Tobacco comments:    currently smoking 7-8 cigs per day  Vaping Use   Vaping status: Never Used  Substance and Sexual Activity   Alcohol use: Yes    Alcohol/week: 14.0 - 21.0 standard drinks of alcohol    Types: 14 - 21 Cans of beer per week    Comment: 2-3 beers daily   Drug use: No   Sexual activity: Never  Other Topics Concern   Not on file  Social History Narrative   Right handed   Drinks caffeine prn   Lives with husband   [place]   retired   Chief Executive Officer Drivers of Corporate investment banker Strain: Medium Risk (06/27/2024)   Overall Financial Resource Strain (CARDIA)    Difficulty of Paying Living Expenses: Somewhat hard  Food Insecurity: Food Insecurity Present (06/27/2024)   Hunger Vital Sign    Worried About Running Out of Food in the Last Year: Sometimes true    Ran Out of Food in the Last Year: Sometimes true  Transportation Needs: No Transportation Needs (06/27/2024)   PRAPARE - Administrator, Civil Service (Medical): No    Lack of Transportation (Non-Medical): No  Physical Activity: Inactive (09/25/2023)   Exercise Vital Sign    Days of Exercise per Week: 0 days    Minutes of Exercise per Session: 0 min  Stress: Stress Concern Present (06/27/2024)   [employer] of  Occupational Health - Occupational Stress Questionnaire    Feeling of Stress: Very much  Social Connections: Unknown (09/25/2023)   Social Connection and Isolation Panel    Frequency of Communication with Friends and Family: More than three times a week    Frequency of Social Gatherings with Friends and Family: Once a week    Attends Religious Services: Not on Marketing executive or Organizations: No    Attends Banker Meetings: Never    Marital Status: Married    Additional Social History: updated  Allergies:   Allergies  Allergen Reactions   Amoxicillin  Diarrhea and Itching  Current Medications: Current Outpatient Medications  Medication Sig Dispense Refill   albuterol  (VENTOLIN  HFA) 108 (90 Base) MCG/ACT inhaler 1 PUFF EVERY 8 HOURS AS NEEDED 18 each 10   albuterol  (VENTOLIN  HFA) 108 (90 Base) MCG/ACT inhaler TAKE 2 PUFFS BY MOUTH EVERY 6 HOURS AS NEEDED FOR WHEEZE OR SHORTNESS OF BREATH 18 each 6   Aspirin-Salicylamide-Caffeine (BC HEADACHE POWDER PO) Take 1 packet by mouth daily as needed (for pain or headache).      celecoxib  (CELEBREX ) 200 MG capsule TAKE 1 CAPSULE BY MOUTH EVERY DAY AS NEEDED 90 capsule 1   diphenoxylate -atropine  (LOMOTIL ) 2.5-0.025 MG tablet TAKE 1 TABLET BY MOUTH 4 (FOUR) TIMES DAILY AS NEEDED FOR DIARRHEA OR LOOSE STOOLS. 120 tablet 2   divalproex  (DEPAKOTE ) 125 MG DR tablet Take 1 tablet (125 mg total) by mouth at bedtime. 60 tablet 11   donepezil  (ARICEPT ) 10 MG tablet Take 1 tablet (10 mg total) by mouth daily. 90 tablet 3   Fluticasone-Umeclidin-Vilant (TRELEGY ELLIPTA ) 200-62.5-25 MCG/ACT AEPB Inhale 1 puff into the lungs daily. 3 each 3   ipratropium-albuterol  (DUONEB) 0.5-2.5 (3) MG/3ML SOLN Take 3 mLs by nebulization every 6 (six) hours as needed. Diagnosis code:  J44.1, J18.9 360 mL 3   mirtazapine  (REMERON ) 15 MG tablet Take 1 tablet (15 mg total) by mouth at bedtime. 30 tablet 0   mometasone  (NASONEX ) 50 MCG/ACT nasal  spray PLACE 2 SPRAYS INTO THE NOSE DAILY. 51 each 4   Multiple Vitamin (MULITIVITAMIN WITH MINERALS) TABS Take 1 tablet by mouth daily.     Polyethyl Glycol-Propyl Glycol (SYSTANE OP) Place 1 drop into both eyes daily as needed (for dry eyes).     pregabalin  (LYRICA ) 25 MG capsule TAKE 1 CAPSULE BY MOUTH EVERYDAY AT BEDTIME 30 capsule 0   Current Facility-Administered Medications  Medication Dose Route Frequency Provider Last Rate Last Admin   ipratropium-albuterol  (DUONEB) 0.5-2.5 (3) MG/3ML nebulizer solution 3 mL  3 mL Nebulization Q4H PRN Mannam, Praveen, MD        ROS: Review of Systems  Constitutional:  Negative for activity change, appetite change, chills, diaphoresis and fatigue.  HENT:  Negative for congestion, dental problem, drooling, ear discharge and ear pain.   Eyes:  Negative for pain, discharge and itching.  Respiratory:  Negative for apnea, cough, choking and chest tightness.   Cardiovascular:  Negative for chest pain, palpitations and leg swelling.  Gastrointestinal:  Negative for abdominal distention, abdominal pain, constipation, diarrhea and nausea.  Endocrine: Negative for cold intolerance and heat intolerance.  Genitourinary:  Negative for difficulty urinating, dysuria, flank pain, frequency, hematuria and urgency.  Musculoskeletal:  Negative for arthralgias, back pain, gait problem, joint swelling, myalgias and neck pain.  Skin:  Negative for color change and pallor.  Allergic/Immunologic: Negative for environmental allergies and food allergies.  Neurological:  Negative for dizziness, seizures, syncope, facial asymmetry, speech difficulty, light-headedness, numbness and headaches.  Psychiatric/Behavioral:  Negative for agitation, behavioral problems, confusion, decreased concentration, dysphoric mood, hallucinations, self-injury, sleep disturbance and suicidal ideas. The patient is not nervous/anxious and is not hyperactive.     Objective:  Psychiatric Specialty  Exam: Blood pressure 135/72, pulse 69, height 5' 5 (1.651 m), weight 84 lb 6.4 oz (38.3 kg).Body mass index is 14.04 kg/m.  General Appearance: Casual  Eye Contact:  Good  Speech:  Normal Rate  Volume:  Normal  Mood:  Euthymic  Affect:  Congruent  Thought Content: Logical   Suicidal Thoughts:  No  Homicidal Thoughts:  No  Thought Process:  Linear  Orientation:  Full (Time, Place, and Person)    Memory: Immediate;   Good Recent;   Fair Remote;   Fair  Judgment:  Fair  Insight:  Fair  Concentration:  Concentration: Good and Attention Span: Good  Recall:  not formally assessed  Fund of Knowledge: Good  Language: Good  Psychomotor Activity:  Normal  Akathisia:  No  AIMS (if indicated): not done  Assets:  Communication Skills Desire for Improvement Financial Resources/Insurance Housing Leisure Time Resilience Social Support Transportation  ADL's:  Intact  Cognition: WNL  Sleep:  Good   PE: General: well-appearing; no acute distress  Pulm: no increased work of breathing on room air  Strength & Muscle Tone: within normal limits Neuro: no focal neurological deficits observed  Gait & Station: normal  Metabolic Disorder Labs: No results found for: HGBA1C, MPG No results found for: PROLACTIN Lab Results  Component Value Date   CHOL 163 03/31/2021   TRIG 46.0 03/31/2021   HDL 73.60 03/31/2021   CHOLHDL 2 03/31/2021   VLDL 9.2 03/31/2021   LDLCALC 80 03/31/2021   LDLCALC 104 (H) 01/22/2018   Lab Results  Component Value Date   TSH 0.46 05/15/2023    Therapeutic Level Labs: No results found for: LITHIUM No results found for: CBMZ No results found for: VALPROATE  Screenings:  GAD-7    Flowsheet Row Office Visit from 07/08/2020 in [facility]  Total GAD-7 Score 0   PHQ2-9    Flowsheet Row Office Visit from 07/15/2024 in [facility] Patient Outreach Telephone from 06/27/2024  in [facility] Office Visit from 06/17/2024 in [facility] Care Coordination from 11/23/2023 in [facility] Community Care Coordination Clinical Support from 09/25/2023 in [facility]  PHQ-2 Total Score 0 0 0 2 0  PHQ-9 Total Score -- -- -- 7 --    Collaboration of Care: Collaboration of Care: Other Dr. Carvin  Patient/Guardian was advised Release of Information must be obtained prior to any record release in order to collaborate their care with an outside provider. Patient/Guardian was advised if they have not already done so to contact the registration department to sign all necessary forms in order for us  to release information regarding their care.   Consent: Patient/Guardian gives verbal consent for treatment and assignment of benefits for services provided during this visit. Patient/Guardian expressed understanding and agreed to proceed.   Neeko Pharo, MD 8/11/20253:56 PM

## 2024-07-04 ENCOUNTER — Telehealth: Payer: Self-pay | Admitting: Licensed Clinical Social Worker

## 2024-07-08 ENCOUNTER — Other Ambulatory Visit: Payer: Self-pay | Admitting: Licensed Clinical Social Worker

## 2024-07-08 NOTE — Patient Outreach (Signed)
 Complex Care Management   Visit Note  07/08/2024  Name:  Lindsey Rowland MRN: [id number] DOB: 1951-09-27  Situation: Referral received for Complex Care Management related to needing to talk to a PCP to get patient into a Long term care facility I obtained verbal consent from POA.  Visit completed with POA/Daughter Lindsey Rowland  on the phone  Background:   Past Medical History:  Diagnosis Date   Actinic keratoses 01/14/2021   Allergic rhinitis 12/10/2008   Arthropathy of ankle and foot 06/10/2009   Chronic obstructive pulmonary disease 07/25/2007   Coronary artery calcification 01/22/2018   Diverticulitis of colon (without mention of hemorrhage) 07/15/2008   Elevated alcohol use    Epiretinal membrane 05/08/2011   Gastroesophageal reflux disease 11/08/2021   Generalized anxiety disorder 10/16/2007   Headache    History of poliomyelitis    History of radiation therapy 09/10/2018-09/17/2018   SBRT 3 fx to right lung; Dr. Lynwood Rowland   Hypothyroidism 06/09/2023   Intercostal pain 05/04/2021   Irritable bowel syndrome 03/09/2010   Lentigo 01/14/2021   Malignant neoplasm of unspecified part of unspecified bronchus or lung 06/09/2023   Mass of chest wall, right 05/04/2021   Mild cognitive impairment with concerns for Alzheimer's disease 10/13/2023   Mild protein-calorie malnutrition 06/09/2023   Non-small cell cancer of right lung 04/16/2018   Nuclear cataract 02/23/2012   Osteoarthritis 12/10/2008   Osteopenia    Restless legs syndrome 01/20/2017   Retinal detachment of right eye with retinal break 02/23/2012   Seborrheic keratoses 01/14/2021   Spasm of muscle 06/29/2010   Status post intraocular lens implant 02/23/2012   Tobacco use 06/29/2010   Ulcer    stomach hx    Assessment: patients daughter Lindsey Rowland stated that she lives in [place] and only needs assistance with getting a hold to the PCP   SDOH Interventions    Flowsheet Row Patient Outreach Telephone  from 06/27/2024 in [facility] Most recent reading at 06/27/2024  2:51 PM Care Coordination from 11/23/2023 in [facility] Most recent reading at 11/23/2023  3:40 PM Care Coordination from 11/23/2023 in [facility] Most recent reading at 11/23/2023  1:12 PM Care Coordination from 10/03/2023 in [facility] Most recent reading at 10/04/2023 10:22 AM Clinical Support from 09/25/2023 in [facility] Most recent reading at 09/25/2023  3:19 PM Clinical Support from 09/21/2022 in [facility] Most recent reading at 09/21/2022  2:27 PM  SDOH Interventions        Food Insecurity Interventions AMB Referral  [VBCI BSW referral placed] Intervention Not Indicated  [Reports lack of appetite but is able to afford food as needed.] -- Intervention Not Indicated Patient Declined Intervention Not Indicated  Housing Interventions Intervention Not Indicated Intervention Not Indicated -- Intervention Not Indicated Intervention Not Indicated Intervention Not Indicated  Transportation Interventions Intervention Not Indicated Intervention Not Indicated -- Intervention Not Indicated Intervention Not Indicated Intervention Not Indicated  Utilities Interventions Intervention Not Indicated Intervention Not Indicated -- Intervention Not Indicated -- --  Alcohol Usage Interventions -- -- -- -- Intervention Not Indicated (Score <7) Intervention Not Indicated (Score <7)  Depression Interventions/Treatment  -- -- Patient refuses Treatment -- -- --  Financial Strain Interventions Intervention Not Indicated -- -- Intervention Not Indicated Patient Declined Intervention Not Indicated  Physical Activity Interventions -- -- -- -- Other (Comments)  [daughter says  has weak legs] Intervention Not Indicated  Stress  Interventions Community Resources Provided -- -- -- Intervention Not Indicated Intervention Not Indicated  Social Connections Interventions -- -- -- -- Intervention Not Indicated Intervention Not Indicated  Health Literacy Interventions -- -- -- -- Intervention Not Indicated --    Recommendation:   none  Follow Up Plan:   Closing From:  Complex Care Management  Lindsey Rowland Lindsey Rowland Lindsey HEDWIG, PhD [facility], [facility] Social Worker Direct Dial: [phone]  Fax: [phone]

## 2024-07-12 ENCOUNTER — Telehealth: Admitting: Licensed Clinical Social Worker

## 2024-07-12 NOTE — Patient Outreach (Addendum)
 Complex Care Management   Visit Note  07/08/2024  Name:  Lindsey Rowland MRN: [id number] DOB: 02-Jul-1951  Situation: Referral received for Complex Care Management related to Patient daughter needed assistance getting connected with the patients PCP I obtained verbal consent from POA.  Visit completed with Lindsey Rowland  (daughter) on the phone  Background:   Past Medical History:  Diagnosis Date   Actinic keratoses 01/14/2021   Allergic rhinitis 12/10/2008   Arthropathy of ankle and foot 06/10/2009   Chronic obstructive pulmonary disease 07/25/2007   Coronary artery calcification 01/22/2018   Diverticulitis of colon (without mention of hemorrhage) 07/15/2008   Elevated alcohol use    Epiretinal membrane 05/08/2011   Gastroesophageal reflux disease 11/08/2021   Generalized anxiety disorder 10/16/2007   Headache    History of poliomyelitis    History of radiation therapy 09/10/2018-09/17/2018   SBRT 3 fx to right lung; Dr. Lynwood Nasuti   Hypothyroidism 06/09/2023   Intercostal pain 05/04/2021   Irritable bowel syndrome 03/09/2010   Lentigo 01/14/2021   Malignant neoplasm of unspecified part of unspecified bronchus or lung 06/09/2023   Mass of chest wall, right 05/04/2021   Mild cognitive impairment with concerns for Alzheimer's disease 10/13/2023   Mild protein-calorie malnutrition 06/09/2023   Non-small cell cancer of right lung 04/16/2018   Nuclear cataract 02/23/2012   Osteoarthritis 12/10/2008   Osteopenia    Restless legs syndrome 01/20/2017   Retinal detachment of right eye with retinal break 02/23/2012   Seborrheic keratoses 01/14/2021   Spasm of muscle 06/29/2010   Status post intraocular lens implant 02/23/2012   Tobacco use 06/29/2010   Ulcer    stomach hx    Assessment: Patients daughter Lindsey Rowland stated that she had all the resources she need and that she is only needing help getting to the PCP, SW advised her to call and set up an appointment with the PCP and go to  the appointment with her mother.    SDOH Interventions    Flowsheet Row Patient Outreach Telephone from 06/27/2024 in [place] [facility] Most recent reading at 06/27/2024  2:51 PM Care Coordination from 11/23/2023 in [facility] Coordination Most recent reading at 11/23/2023  3:40 PM Care Coordination from 11/23/2023 in [facility] Coord[facility] Most recent reading at 11/23/2023  1:12 PM Care Coordination from 10/03/2023 in [facility] Most recent reading at 10/04/2023 10:22 AM Clinical Support from 09/25/2023 in [facility] [place] Most recent reading at 09/25/2023  3:19 PM Clinical Support from 09/21/2022 in [facility] [place] Most recent reading at 09/21/2022  2:27 PM  SDOH Interventions        Food Insecurity Interventions AMB Referral  [VBCI BSW referral placed] Intervention Not Indicated  [Reports lack of appetite but is able to afford food as needed.] -- Intervention Not Indicated Patient Declined Intervention Not Indicated  Housing Interventions Intervention Not Indicated Intervention Not Indicated -- Intervention Not Indicated Intervention Not Indicated Intervention Not Indicated  Transportation Interventions Intervention Not Indicated Intervention Not Indicated -- Intervention Not Indicated Intervention Not Indicated Intervention Not Indicated  Utilities Interventions Intervention Not Indicated Intervention Not Indicated -- Intervention Not Indicated -- --  Alcohol Usage Interventions -- -- -- -- Intervention Not Indicated (Score <7) Intervention Not Indicated (Score <7)  Depression Interventions/Treatment  -- -- Patient refuses Treatment -- -- --  Financial Strain Interventions Intervention Not Indicated -- -- Intervention Not Indicated Patient  Declined Intervention Not Indicated  Physical Activity  Interventions -- -- -- -- Other (Comments)  [daughter says has weak legs] Intervention Not Indicated  Stress Interventions Community Resources Provided -- -- -- Intervention Not Indicated Intervention Not Indicated  Social Connections Interventions -- -- -- -- Intervention Not Indicated Intervention Not Indicated  Health Literacy Interventions -- -- -- -- Intervention Not Indicated --      Recommendation:   none  Follow Up Plan:   Closing From:  Complex Care Management  Tobias CHARM Maranda HEDWIG, PhD [facility], [facility] Social Worker Direct Dial: [phone]  Fax: [phone]

## 2024-07-12 NOTE — Patient Instructions (Signed)
 Visit Information  Thank you for taking time to visit with me today. Please don't hesitate to contact me if I can be of assistance to you before our next scheduled appointment.  Our next appointment is no further scheduled appointments.   Please call the care guide team at [phone] if you need to cancel or reschedule your appointment.   Following is a copy of your care plan:   Goals Addressed   None     Please call the Suicide and Crisis Lifeline: 988 go to [facility]gent [facility] [address] [place] [phone]) call 911 if you are experiencing a Mental Health or Behavioral Health Crisis or need someone to talk to.  Patient verbalizes understanding of instructions and care plan provided today and agrees to view in MyChart. Active MyChart status and patient understanding of how to access instructions and care plan via MyChart confirmed with patient.     Tobias CHARM Maranda HEDWIG, PhD [facility], [facility] Social Worker Direct Dial: [phone]  Fax: [phone]

## 2024-07-15 ENCOUNTER — Encounter (HOSPITAL_COMMUNITY): Payer: Self-pay

## 2024-07-15 ENCOUNTER — Ambulatory Visit (HOSPITAL_BASED_OUTPATIENT_CLINIC_OR_DEPARTMENT_OTHER)

## 2024-07-15 DIAGNOSIS — R630 Anorexia: Secondary | ICD-10-CM | POA: Diagnosis not present

## 2024-07-15 DIAGNOSIS — G4709 Other insomnia: Secondary | ICD-10-CM

## 2024-07-15 DIAGNOSIS — G3184 Mild cognitive impairment, so stated: Secondary | ICD-10-CM | POA: Diagnosis not present

## 2024-07-15 MED ORDER — MIRTAZAPINE 15 MG PO TABS: 15.0000 mg | ORAL_TABLET | Freq: Every day | ORAL | 1 refills | Status: DC

## 2024-07-16 NOTE — Addendum Note (Signed)
 Addended by: CARVIN CROCK on: 07/16/2024 08:38 AM   Modules accepted: Level of Service

## 2024-07-19 ENCOUNTER — Other Ambulatory Visit: Payer: Self-pay | Admitting: Licensed Clinical Social Worker

## 2024-07-19 ENCOUNTER — Ambulatory Visit: Payer: HMO | Admitting: Physician Assistant

## 2024-07-25 DIAGNOSIS — J449 Chronic obstructive pulmonary disease, unspecified: Secondary | ICD-10-CM | POA: Diagnosis not present

## 2024-08-12 ENCOUNTER — Telehealth: Payer: Self-pay | Admitting: *Deleted

## 2024-08-12 NOTE — Telephone Encounter (Signed)
 Called patient to inform of CT for 08-23-24- arrival time- 11:45 am @ [facility] Rad[facility],  no restrictions to scan, patient to receive results from Dr. Shannon on 08-29-24 @ 11 am, spoke with patient and he is aware of these appts. and the instructions

## 2024-08-19 ENCOUNTER — Telehealth: Payer: Self-pay | Admitting: *Deleted

## 2024-08-19 ENCOUNTER — Telehealth: Payer: Self-pay

## 2024-08-19 NOTE — Telephone Encounter (Signed)
 CALLED PATIENT'S DAUGHTER- KERSTEN BOST TO INFORM OF CT FOR 09-12-24- ARRIVAL TIME- 1:15 PM- @ [facility] RADIOLOG[facility], NO RESTRICTIONS TO SCAN, PATIENT TO RECEIVE RESULTS FROM DR. KINARD ON 09-16-24 @ 10 AM, SPOKE WITH PATIENT'S DAUGHTER KERSTEN BOST AND SHE IS AWARE OF THESE APPTS. AND THE INSTRUCTIONS

## 2024-08-19 NOTE — Telephone Encounter (Signed)
 Received a call from patients daughter requesting patient's CT scan be moved out until October due to patients spouse passing away recently.

## 2024-08-23 ENCOUNTER — Ambulatory Visit (HOSPITAL_COMMUNITY)

## 2024-08-25 DIAGNOSIS — J449 Chronic obstructive pulmonary disease, unspecified: Secondary | ICD-10-CM | POA: Diagnosis not present

## 2024-08-27 ENCOUNTER — Telehealth: Payer: Self-pay | Admitting: Radiation Oncology

## 2024-08-27 NOTE — Telephone Encounter (Signed)
 9/23 patient's daughter called to r/s patient [id number] appt to later date/time after CT scans.

## 2024-08-29 ENCOUNTER — Ambulatory Visit: Payer: Self-pay | Admitting: Radiation Oncology

## 2024-08-31 DIAGNOSIS — J440 Chronic obstructive pulmonary disease with acute lower respiratory infection: Secondary | ICD-10-CM | POA: Diagnosis not present

## 2024-08-31 DIAGNOSIS — Z9981 Dependence on supplemental oxygen: Secondary | ICD-10-CM | POA: Diagnosis not present

## 2024-08-31 DIAGNOSIS — R0602 Shortness of breath: Secondary | ICD-10-CM | POA: Diagnosis not present

## 2024-08-31 DIAGNOSIS — J189 Pneumonia, unspecified organism: Secondary | ICD-10-CM | POA: Diagnosis not present

## 2024-08-31 DIAGNOSIS — R918 Other nonspecific abnormal finding of lung field: Secondary | ICD-10-CM | POA: Diagnosis not present

## 2024-08-31 DIAGNOSIS — Z20822 Contact with and (suspected) exposure to covid-19: Secondary | ICD-10-CM | POA: Diagnosis not present

## 2024-08-31 DIAGNOSIS — I252 Old myocardial infarction: Secondary | ICD-10-CM | POA: Diagnosis not present

## 2024-08-31 DIAGNOSIS — F17200 Nicotine dependence, unspecified, uncomplicated: Secondary | ICD-10-CM | POA: Diagnosis not present

## 2024-08-31 DIAGNOSIS — J168 Pneumonia due to other specified infectious organisms: Secondary | ICD-10-CM | POA: Diagnosis not present

## 2024-09-01 DIAGNOSIS — I498 Other specified cardiac arrhythmias: Secondary | ICD-10-CM | POA: Diagnosis not present

## 2024-09-05 ENCOUNTER — Telehealth: Payer: Self-pay | Admitting: Physician Assistant

## 2024-09-05 ENCOUNTER — Other Ambulatory Visit: Payer: Self-pay

## 2024-09-05 ENCOUNTER — Other Ambulatory Visit: Payer: Self-pay | Admitting: Family Medicine

## 2024-09-05 ENCOUNTER — Other Ambulatory Visit: Payer: Self-pay | Admitting: Physician Assistant

## 2024-09-05 DIAGNOSIS — G4709 Other insomnia: Secondary | ICD-10-CM

## 2024-09-05 DIAGNOSIS — J441 Chronic obstructive pulmonary disease with (acute) exacerbation: Secondary | ICD-10-CM

## 2024-09-05 NOTE — Telephone Encounter (Signed)
 I explained to daughter rx was sent with the wrong strength. They currently having plenty. Wanted me to let you know that the husband passed away. Mom is staying alone, doing worse. She is scheduled for Dec 2025. FYI

## 2024-09-05 NOTE — Telephone Encounter (Signed)
 Aricept  dose should be 10mg . Will call shortly, refill request was sent in for Aricept  5mg 

## 2024-09-05 NOTE — Telephone Encounter (Signed)
 Pts daughter called about Rx and questions about mother/Pt

## 2024-09-09 NOTE — Progress Notes (Signed)
 Psychiatric Follow Up  Patient Identification: Lindsey Rowland MRN:  [id number] Date of Evaluation:  09/16/2024 Referral Source: Neurology  Televisit via video: I connected with patient on 09/16/24 at  4:00 PM EDT by a video enabled telemedicine application and verified that I am speaking with the correct person using two identifiers  Location: Patient: Home Provider: Clinic   I discussed the limitations of evaluation and management by telemedicine and the availability of in person appointments. The patient expressed understanding and agreed to proceed.  I discussed the assessment and treatment plan with the patient. The patient was provided an opportunity to ask questions and all were answered. The patient agreed with the plan and demonstrated an understanding of the instructions.   The patient was advised to call back or seek an in-person evaluation if the symptoms worsen or if the condition fails to improve as anticipated.  Assessment:  Lindsey Rowland is a 73 y.o. female with a history of insomnia, MNCD, alcohol use disorder, reported depression, insomnia, reduced appetite who presents for follow-up appointment today.  She has had recent changes in her life due to passing away of her husband which has affected her sleep and appetite.  She is not feeling depressed or anxious currently, has been sleeping well but her appetite has remained poor.  She has been compliant with medications and has had no physical concerns.  She is also not actively or passively suicidal or homicidal.  No mood deterioration in her memory was observed and her daughter was able to verify all the information.  She is continue to smoke 3 cigarettes a day and drinks a beer a day, encouraged her abstinence.  She is driving to the nearest dollar store, is not driving for long distances.  No safety issues observed or reported.  We discussed about continuing the same medication regimen and monitor for any side effects.   Encourage continued compliance, plan to have her back in clinic in 4 weeks.  Risk Assessment: An assessment of suicide and violence risk factors was performed as part of this evaluation and is not  significantly changed from the last visit. While future psychiatric events cannot be accurately predicted, the patient does not  currently require acute inpatient psychiatric care and does not  currently meet [place]  involuntary commitment criteria. Patient was given contact information for crisis resources, behavioral health clinic and was instructed to call 911 for emergencies   Plan:  # MNCD Past medication trials: Donepezil , Depakote  Status of problem: Chronic Interventions: -- Patient is on donepezil  10 mg daily, encouraged compliance, managed by neurologist -- Continue Depakote  125 mg at bedtime for mood, managed by her neurologist -- Continue mirtazapine  to 15 mg nightly -- Encouraged to stop drinking alcohol and quit smoking  # Anorexia Past medication trials: Mirtazapine  Status of problem: Chronic/current Interventions: -- Continue mirtazapine  to 15 mg nightly  # Insomnia Past medication trials: Mirtazapine  Status of problem: Current Interventions: -- Continue mirtazapine  to 15 mg nightly -- Encouraged sleep hygiene -- Encouraged to stop drinking alcohol and quit smoking  Return to care in 1 months  Patient was given contact information for behavioral health clinic and was instructed to call 911 for emergencies.    Patient and plan of care will be discussed with the Attending MD ,Dr. Carvin, who agrees with the above statement and plan.   Subjective:  Chief Complaint:  Chief Complaint  Patient presents with   Medication Refill   Follow-up   Anorexia  History of Present Illness:   Lindsey Rowland is a 73 y.o. female with a history of insomnia, MNCD, alcohol use disorder, reported depression, insomnia, reduced appetite who presents in person to [facility] for follow-up of behavioral problems/memory issues.  Patient is being followed up by neurology, there are issues that she had been off donepezil  and Depakote  for mood stabilization which was restarted in May 2025.  She has continued to drink 3 beers a day and smoke which is likely contributing to her memory issues.   Today, patient reported her mood as good .  She denied any active or passive SI/HI/AVH, was alert and oriented and not in acute distress.  Reported that her sleep has been good , better now.  She denied any nightmares or flashbacks, stated that  I have a lot of things to take care of, reported that her husband passed away 2 weeks ago but she has been able to cope with it well now.  She denied feeling depressed or anxious.  Continues to report poor appetite, encouraged eating more.  She is currently smoking 3 cigarettes a day, recently was diagnosed with pneumonia, encouraged to stop smoking.  She is also drinking a beer a day.  She denied using any other substances.  Her daughter was able to confirm all the above mention and stated that her mother has been improving after the passing of her dad.  She has been compliant with the medications, denied any side effects or any new physical concerns.  Encourage continued compliance, prescription sent to the pharmacy, plan to have her back in 1 month.  Past Psychiatric History:  Diagnoses: insomnia, MNCD, alcohol use disorder, reported depression, insomnia, reduced appetite  Medication trials: Depakote , donepezil , Remeron  Previous psychiatrist/therapist: None Hospitalizations: None Suicide attempts: None SIB: Not available Hx of violence towards others: None Current access to guns: Denies Hx of trauma/abuse: Denies  Substance Abuse History in the last 12 months:  No.  Past Medical History:  Past Medical History:  Diagnosis Date   Actinic keratoses 01/14/2021   Allergic rhinitis 12/10/2008   Arthropathy of  ankle and foot 06/10/2009   Chronic obstructive pulmonary disease 07/25/2007   Coronary artery calcification 01/22/2018   Diverticulitis of colon (without mention of hemorrhage) 07/15/2008   Elevated alcohol use    Epiretinal membrane 05/08/2011   Gastroesophageal reflux disease 11/08/2021   Generalized anxiety disorder 10/16/2007   Headache    History of poliomyelitis    History of radiation therapy 09/10/2018-09/17/2018   SBRT 3 fx to right lung; Dr. Lynwood Nasuti   Hypothyroidism 06/09/2023   Intercostal pain 05/04/2021   Irritable bowel syndrome 03/09/2010   Lentigo 01/14/2021   Malignant neoplasm of unspecified part of unspecified bronchus or lung 06/09/2023   Mass of chest wall, right 05/04/2021   Mild cognitive impairment with concerns for Alzheimer's disease 10/13/2023   Mild protein-calorie malnutrition 06/09/2023   Non-small cell cancer of right lung 04/16/2018   Nuclear cataract 02/23/2012   Osteoarthritis 12/10/2008   Osteopenia    Restless legs syndrome 01/20/2017   Retinal detachment of right eye with retinal break 02/23/2012   Seborrheic keratoses 01/14/2021   Spasm of muscle 06/29/2010   Status post intraocular lens implant 02/23/2012   Tobacco use 06/29/2010   Ulcer    stomach hx    Past Surgical History:  Procedure Laterality Date   APPENDECTOMY     BREAST BIOPSY Right 2013   CATARACT EXTRACTION     right  DILATION AND CURETTAGE OF UTERUS     FOOT SURGERY Bilateral    hip si joint     injection/ablation   RETINAL DETACHMENT SURGERY  12/2010   right eye   TONSILLECTOMY     VIDEO BRONCHOSCOPY WITH ENDOBRONCHIAL NAVIGATION Right 08/01/2018   Procedure: VIDEO BRONCHOSCOPY WITH ENDOBRONCHIAL NAVIGATION Fiducial Placement;  Surgeon: Shelah Lamar RAMAN, MD;  Location: [facility] OR;  Service: Thoracic;  Laterality: Right;    Family Psychiatric History: Alzheimer's Disease in mother/unable to describe the psychiatric symptoms  Family History:  Family History   Problem Relation Age of Onset   Dementia Mother    Hiatal hernia Father    Mental illness Maternal Aunt    Suicidality Maternal Aunt    Colon cancer Neg Hx    Esophageal cancer Neg Hx    Stomach cancer Neg Hx    Rectal cancer Neg Hx     Social History:   Academic/Vocational: None Social History   Socioeconomic History   Marital status: Married    Spouse name: Not on file   Number of children: 2   Years of education: 13   Highest education level: Some college, no degree  Occupational History   Occupation: Retired  Tobacco Use   Smoking status: Every Day    Current packs/day: 0.25    Average packs/day: 0.3 packs/day for 40.0 years (10.0 ttl pk-yrs)    Types: Cigarettes   Smokeless tobacco: Never   Tobacco comments:    currently smoking 2-3 cigarettes a day  Vaping Use   Vaping status: Never Used  Substance and Sexual Activity   Alcohol use: Yes    Alcohol/week: 14.0 - 21.0 standard drinks of alcohol    Types: 14 - 21 Cans of beer per week    Comment: 2-3 beers daily   Drug use: No   Sexual activity: Never  Other Topics Concern   Not on file  Social History Narrative   Right handed   Drinks caffeine prn   Lives with husband   [place]   retired   Chief Executive Officer Drivers of Corporate investment banker Strain: Medium Risk (06/27/2024)   Overall Financial Resource Strain (CARDIA)    Difficulty of Paying Living Expenses: Somewhat hard  Food Insecurity: Food Insecurity Present (06/27/2024)   Hunger Vital Sign    Worried About Running Out of Food in the Last Year: Sometimes true    Ran Out of Food in the Last Year: Sometimes true  Transportation Needs: No Transportation Needs (06/27/2024)   PRAPARE - Administrator, Civil Service (Medical): No    Lack of Transportation (Non-Medical): No  Physical Activity: Inactive (09/25/2023)   Exercise Vital Sign    Days of Exercise per Week: 0 days    Minutes of Exercise per Session: 0 min  Stress: Stress Concern Present  (06/27/2024)   [employer] of Occupational Health - Occupational Stress Questionnaire    Feeling of Stress: Very much  Social Connections: Unknown (09/25/2023)   Social Connection and Isolation Panel    Frequency of Communication with Friends and Family: More than three times a week    Frequency of Social Gatherings with Friends and Family: Once a week    Attends Religious Services: Not on Marketing executive or Organizations: No    Attends Banker Meetings: Never    Marital Status: Married    Additional Social History: updated  Allergies:   Allergies  Allergen Reactions  Amoxicillin  Diarrhea and Itching    Current Medications: Current Outpatient Medications  Medication Sig Dispense Refill   albuterol  (VENTOLIN  HFA) 108 (90 Base) MCG/ACT inhaler 1 PUFF EVERY 8 HOURS AS NEEDED 18 each 10   albuterol  (VENTOLIN  HFA) 108 (90 Base) MCG/ACT inhaler TAKE 2 PUFFS BY MOUTH EVERY 6 HOURS AS NEEDED FOR WHEEZE OR SHORTNESS OF BREATH (Patient not taking: Reported on 09/12/2024) 18 each 6   Aspirin-Salicylamide-Caffeine (BC HEADACHE POWDER PO) Take 1 packet by mouth daily as needed (for pain or headache).      celecoxib  (CELEBREX ) 200 MG capsule TAKE 1 CAPSULE BY MOUTH EVERY DAY AS NEEDED 90 capsule 1   diphenoxylate -atropine  (LOMOTIL ) 2.5-0.025 MG tablet TAKE 1 TABLET BY MOUTH 4 (FOUR) TIMES DAILY AS NEEDED FOR DIARRHEA OR LOOSE STOOLS. 120 tablet 2   divalproex  (DEPAKOTE ) 125 MG DR tablet Take 1 tablet (125 mg total) by mouth at bedtime. 60 tablet 11   donepezil  (ARICEPT ) 10 MG tablet Take 1 tablet (10 mg total) by mouth daily. 90 tablet 3   Fluticasone-Umeclidin-Vilant (TRELEGY ELLIPTA ) 200-62.5-25 MCG/ACT AEPB Inhale 1 puff into the lungs daily. 3 each 3   ipratropium-albuterol  (DUONEB) 0.5-2.5 (3) MG/3ML SOLN Take 3 mLs by nebulization every 6 (six) hours as needed. Diagnosis code:  J44.1, J18.9 360 mL 3   mirtazapine  (REMERON ) 15 MG tablet Take 1 tablet (15 mg  total) by mouth at bedtime. 90 tablet 0   mometasone  (NASONEX ) 50 MCG/ACT nasal spray PLACE 2 SPRAYS INTO THE NOSE DAILY. (Patient taking differently: Place 2 sprays into the nose daily. prn) 51 each 4   Multiple Vitamin (MULITIVITAMIN WITH MINERALS) TABS Take 1 tablet by mouth daily.     nicotine (NICODERM CQ - DOSED IN MG/24 HOURS) 14 mg/24hr patch Place 1 patch (14 mg total) onto the skin daily. 28 patch 0   Polyethyl Glycol-Propyl Glycol (SYSTANE OP) Place 1 drop into both eyes daily as needed (for dry eyes).     pregabalin  (LYRICA ) 25 MG capsule TAKE 1 CAPSULE BY MOUTH EVERYDAY AT BEDTIME (Patient not taking: Reported on 09/12/2024) 30 capsule 0   Varenicline Tartrate, Starter, (CHANTIX STARTING MONTH PAK) 0.5 MG X 11 & 1 MG X 42 TBPK Take 1 tablet by mouth daily. 1 each 2   Current Facility-Administered Medications  Medication Dose Route Frequency Provider Last Rate Last Admin   ipratropium-albuterol  (DUONEB) 0.5-2.5 (3) MG/3ML nebulizer solution 3 mL  3 mL Nebulization Q4H PRN Mannam, Praveen, MD        ROS: Review of Systems  Constitutional:  Negative for activity change, appetite change, chills, diaphoresis and fatigue.  HENT:  Negative for congestion, dental problem, drooling, ear discharge and ear pain.   Eyes:  Negative for pain, discharge and itching.  Respiratory:  Negative for apnea, cough, choking and chest tightness.   Cardiovascular:  Negative for chest pain, palpitations and leg swelling.  Gastrointestinal:  Negative for abdominal distention, abdominal pain, constipation, diarrhea and nausea.  Endocrine: Negative for cold intolerance and heat intolerance.  Genitourinary:  Negative for difficulty urinating, dysuria, flank pain, frequency, hematuria and urgency.  Musculoskeletal:  Negative for arthralgias, back pain, gait problem, joint swelling, myalgias and neck pain.  Skin:  Negative for color change and pallor.  Allergic/Immunologic: Negative for environmental allergies  and food allergies.  Neurological:  Negative for dizziness, seizures, syncope, facial asymmetry, speech difficulty, light-headedness, numbness and headaches.  Psychiatric/Behavioral:  Negative for agitation, behavioral problems, confusion, decreased concentration, dysphoric mood, hallucinations, self-injury, sleep disturbance and  suicidal ideas. The patient is not nervous/anxious and is not hyperactive.     Objective:  Psychiatric Specialty Exam: There were no vitals taken for this visit.There is no height or weight on file to calculate BMI.  General Appearance: Casual  Eye Contact:  Good  Speech:  Normal Rate  Volume:  Normal  Mood:  Euthymic  Affect:  Congruent  Thought Content: Logical   Suicidal Thoughts:  No  Homicidal Thoughts:  No  Thought Process:  Linear  Orientation:  Full (Time, Place, and Person)    Memory: Immediate;   Good Recent;   Fair Remote;   Fair  Judgment:  Fair  Insight:  Fair  Concentration:  Concentration: Good and Attention Span: Good  Recall:  not formally assessed  Fund of Knowledge: Good  Language: Good  Psychomotor Activity:  Normal  Akathisia:  No  AIMS (if indicated): not done  Assets:  Communication Skills Desire for Improvement Financial Resources/Insurance Housing Leisure Time Resilience Social Support Transportation  ADL's:  Intact  Cognition: WNL  Sleep:  Good   PE: General: well-appearing; no acute distress  Pulm: no increased work of breathing on room air  Strength & Muscle Tone: within normal limits Neuro: no focal neurological deficits observed  Gait & Station: normal  Metabolic Disorder Labs: No results found for: HGBA1C, MPG No results found for: PROLACTIN Lab Results  Component Value Date   CHOL 163 03/31/2021   TRIG 46.0 03/31/2021   HDL 73.60 03/31/2021   CHOLHDL 2 03/31/2021   VLDL 9.2 03/31/2021   LDLCALC 80 03/31/2021   LDLCALC 104 (H) 01/22/2018   Lab Results  Component Value Date   TSH 0.46  05/15/2023    Therapeutic Level Labs: No results found for: LITHIUM No results found for: CBMZ No results found for: VALPROATE  Screenings:  GAD-7    Flowsheet Row Office Visit from 07/08/2020 in [facility] [place]  Total GAD-7 Score 0   PHQ2-9    Flowsheet Row Office Visit from 07/15/2024 in [facility] Patient Outreach Telephone from 06/27/2024 in [facility] Office Visit from 06/17/2024 in [facility] Care Coordination from 11/23/2023 in [facility] Coordination Clinical Support from 09/25/2023 in [facility] [employer]  PHQ-2 Total Score 0 0 0 2 0  PHQ-9 Total Score -- -- -- 7 --    Collaboration of Care: Collaboration of Care: Other Dr. Carvin  Patient/Guardian was advised Release of Information must be obtained prior to any record release in order to collaborate their care with an outside provider. Patient/Guardian was advised if they have not already done so to contact the registration department to sign all necessary forms in order for us  to release information regarding their care.   Consent: Patient/Guardian gives verbal consent for treatment and assignment of benefits for services provided during this visit. Patient/Guardian expressed understanding and agreed to proceed.   Sonyia Muro, MD 10/13/20254:17 PM

## 2024-09-11 ENCOUNTER — Ambulatory Visit: Admitting: Pulmonary Disease

## 2024-09-11 ENCOUNTER — Encounter

## 2024-09-12 ENCOUNTER — Encounter: Payer: Self-pay | Admitting: Pulmonary Disease

## 2024-09-12 ENCOUNTER — Ambulatory Visit: Admitting: Pulmonary Disease

## 2024-09-12 ENCOUNTER — Ambulatory Visit (INDEPENDENT_AMBULATORY_CARE_PROVIDER_SITE_OTHER)

## 2024-09-12 ENCOUNTER — Ambulatory Visit (HOSPITAL_COMMUNITY)

## 2024-09-12 VITALS — BP 126/60 | HR 72 | Temp 98.4°F | Ht 66.0 in | Wt 85.0 lb

## 2024-09-12 DIAGNOSIS — J449 Chronic obstructive pulmonary disease, unspecified: Secondary | ICD-10-CM

## 2024-09-12 DIAGNOSIS — J439 Emphysema, unspecified: Secondary | ICD-10-CM

## 2024-09-12 DIAGNOSIS — F0390 Unspecified dementia without behavioral disturbance: Secondary | ICD-10-CM

## 2024-09-12 DIAGNOSIS — J4489 Other specified chronic obstructive pulmonary disease: Secondary | ICD-10-CM

## 2024-09-12 DIAGNOSIS — R911 Solitary pulmonary nodule: Secondary | ICD-10-CM

## 2024-09-12 DIAGNOSIS — J441 Chronic obstructive pulmonary disease with (acute) exacerbation: Secondary | ICD-10-CM | POA: Diagnosis not present

## 2024-09-12 DIAGNOSIS — Z77120 Contact with and (suspected) exposure to mold (toxic): Secondary | ICD-10-CM

## 2024-09-12 DIAGNOSIS — Z77090 Contact with and (suspected) exposure to asbestos: Secondary | ICD-10-CM

## 2024-09-12 DIAGNOSIS — F17210 Nicotine dependence, cigarettes, uncomplicated: Secondary | ICD-10-CM | POA: Diagnosis not present

## 2024-09-12 LAB — PULMONARY FUNCTION TEST
DL/VA % pred: 42 %
DL/VA: 1.73 ml/min/mmHg/L
DLCO cor % pred: 27 %
DLCO cor: 5.23 ml/min/mmHg
DLCO unc % pred: 28 %
DLCO unc: 5.45 ml/min/mmHg
FEF 25-75 Post: 0.48 L/s
FEF 25-75 Pre: 0.31 L/s
FEF2575-%Change-Post: 54 %
FEF2575-%Pred-Post: 28 %
FEF2575-%Pred-Pre: 18 %
FEV1-%Change-Post: 19 %
FEV1-%Pred-Post: 45 %
FEV1-%Pred-Pre: 38 %
FEV1-Post: 0.98 L
FEV1-Pre: 0.82 L
FEV1FVC-%Change-Post: 5 %
FEV1FVC-%Pred-Pre: 55 %
FEV6-%Change-Post: 17 %
FEV6-%Pred-Post: 78 %
FEV6-%Pred-Pre: 66 %
FEV6-Post: 2.13 L
FEV6-Pre: 1.8 L
FEV6FVC-%Change-Post: 4 %
FEV6FVC-%Pred-Post: 100 %
FEV6FVC-%Pred-Pre: 96 %
FVC-%Change-Post: 13 %
FVC-%Pred-Post: 78 %
FVC-%Pred-Pre: 69 %
FVC-Post: 2.23 L
FVC-Pre: 1.97 L
Post FEV1/FVC ratio: 44 %
Post FEV6/FVC ratio: 96 %
Pre FEV1/FVC ratio: 42 %
Pre FEV6/FVC Ratio: 92 %
RV % pred: 212 %
RV: 4.82 L
TLC % pred: 137 %
TLC: 6.93 L

## 2024-09-12 MED ORDER — NICOTINE 14 MG/24HR TD PT24: 14.0000 mg | MEDICATED_PATCH | Freq: Every day | TRANSDERMAL | 0 refills | Status: AC

## 2024-09-12 MED ORDER — VARENICLINE TARTRATE (STARTER) 0.5 MG X 11 & 1 MG X 42 PO TBPK: 1.0000 | ORAL_TABLET | Freq: Every day | ORAL | 2 refills | Status: AC

## 2024-09-12 NOTE — Patient Instructions (Signed)
 Full PFT performed today.

## 2024-09-12 NOTE — Progress Notes (Signed)
 Lindsey Rowland    [id number]    [birth year]  Primary Care Physician:Kremer, Elsie Sayre, MD  Referring Physician: Tariana Moldovan, MD [address] [place],  [place] [address]  Chief complaint:   Follow up for COPD  HPI: 73 y.o. who  has a past medical history of Actinic keratoses (01/14/2021), Allergic rhinitis (12/10/2008), Arthropathy of ankle and foot (06/10/2009), Chronic obstructive pulmonary disease (07/25/2007), Coronary artery calcification (01/22/2018), Diverticulitis of colon (without mention of hemorrhage) (07/15/2008), Elevated alcohol use, Epiretinal membrane (05/08/2011), Gastroesophageal reflux disease (11/08/2021), Generalized anxiety disorder (10/16/2007), Headache, History of poliomyelitis, History of radiation therapy (09/10/2018-09/17/2018), Hypothyroidism (06/09/2023), Intercostal pain (05/04/2021), Irritable bowel syndrome (03/09/2010), Lentigo (01/14/2021), Malignant neoplasm of unspecified part of unspecified bronchus or lung (06/09/2023), Mass of chest wall, right (05/04/2021), Mild cognitive impairment with concerns for Alzheimer's disease (10/13/2023), Mild protein-calorie malnutrition (06/09/2023), Non-small cell cancer of right lung (04/16/2018), Nuclear cataract (02/23/2012), Osteoarthritis (12/10/2008), Osteopenia, Restless legs syndrome (01/20/2017), Retinal detachment of right eye with retinal break (02/23/2012), Seborrheic keratoses (01/14/2021), Spasm of muscle (06/29/2010), Status post intraocular lens implant (02/23/2012), Tobacco use (06/29/2010), and Ulcer.   Discussed the use of AI scribe software for clinical note transcription with the patient, who gave verbal consent to proceed.  The patient, with a history of COPD and lung cancer, presents for a follow-up appointment after gap of 3 years after missing previous appointments due to confusion and memory loss. The patient is also experiencing weight loss and decreased appetite, and is  receiving palliative care. The patient is on Stiolto for COPD and continues to smoke, albeit less than before. The patient also has a history of asbestos exposure from her previous job at a [employer]. The patient's home was recently treated for mold in 2023, which was suspected to be affecting the patient's cognitive and respiratory health.   She was hospitalized in March and April 2025 at [facility] for respiratory issues.   Interim history: History of Present Illness ANDREY HOOBLER is a 73 year old female with severe COPD and emphysema who presents for follow-up after recent hospitalizations for pneumonia. She is accompanied by her daughter, Crystal.  Chronic obstructive pulmonary disease and emphysema - Severe COPD and emphysema, initially diagnosed as moderate COPD in 2019 with FEV1 of 76%, now decreased to 45% - Multiple hospitalizations for pneumonia, including recent admissions in March, April, and two weeks ago - Persistent cough, attributed to weather and smoking - Current use of Trelegy inhaler - Oxygen therapy initiated this year; uses oxygen at home and has a mobile unit, but does not consistently use it outside unless on trips  Tobacco use - Continues to smoke despite reducing frequency - Daughter encourages smoking cessation  Pulmonary infections and imaging findings -Evaluated in the ED at [facility] September 27 for dyspnea - Imaging showed nodularity in the left upper lobe and signs of pneumonia and stable post-treatment changes at the right base from previous cancer treatment - Completed a course of azithromycin and cefproaxime for pneumonia   Relevant pulmonary history Pets: Dog, cat 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  Continues to smoke a few cigarettes every day Travel history: No significant travel  history Relevant family history: No family history of lung disease  Outpatient Encounter Medications as of 09/12/2024  Medication Sig   albuterol  (VENTOLIN  HFA) 108 (90  Base) MCG/ACT inhaler 1 PUFF EVERY 8 HOURS AS NEEDED   Aspirin-Salicylamide-Caffeine (BC HEADACHE POWDER PO) Take 1 packet by mouth daily as needed (for pain or headache).    celecoxib  (CELEBREX ) 200 MG capsule TAKE 1 CAPSULE BY MOUTH EVERY DAY AS NEEDED   diphenoxylate -atropine  (LOMOTIL ) 2.5-0.025 MG tablet TAKE 1 TABLET BY MOUTH 4 (FOUR) TIMES DAILY AS NEEDED FOR DIARRHEA OR LOOSE STOOLS.   divalproex  (DEPAKOTE ) 125 MG DR tablet Take 1 tablet (125 mg total) by mouth at bedtime.   donepezil  (ARICEPT ) 10 MG tablet Take 1 tablet (10 mg total) by mouth daily.   Fluticasone-Umeclidin-Vilant (TRELEGY ELLIPTA ) 200-62.5-25 MCG/ACT AEPB Inhale 1 puff into the lungs daily.   ipratropium-albuterol  (DUONEB) 0.5-2.5 (3) MG/3ML SOLN Take 3 mLs by nebulization every 6 (six) hours as needed. Diagnosis code:  J44.1, J18.9   mirtazapine  (REMERON ) 15 MG tablet Take 1 tablet (15 mg total) by mouth at bedtime.   mometasone  (NASONEX ) 50 MCG/ACT nasal spray PLACE 2 SPRAYS INTO THE NOSE DAILY. (Patient taking differently: Place 2 sprays into the nose daily. prn)   Multiple Vitamin (MULITIVITAMIN WITH MINERALS) TABS Take 1 tablet by mouth daily.   Polyethyl Glycol-Propyl Glycol (SYSTANE OP) Place 1 drop into both eyes daily as needed (for dry eyes).   albuterol  (VENTOLIN  HFA) 108 (90 Base) MCG/ACT inhaler TAKE 2 PUFFS BY MOUTH EVERY 6 HOURS AS NEEDED FOR WHEEZE OR SHORTNESS OF BREATH (Patient not taking: Reported on 09/12/2024)   pregabalin  (LYRICA ) 25 MG capsule TAKE 1 CAPSULE BY MOUTH EVERYDAY AT BEDTIME (Patient not taking: Reported on 09/12/2024)   Facility-Administered Encounter Medications as of 09/12/2024  Medication   ipratropium-albuterol  (DUONEB) 0.5-2.5 (3) MG/3ML nebulizer solution 3 mL   Vitals:   09/12/24 1433  BP: 126/60  Pulse: 72   Temp: 98.4 F (36.9 C)  Height: 5' 6 (1.676 m)  Weight: 85 lb (38.6 kg)  SpO2: 96%  TempSrc: Oral  BMI (Calculated): 13.73     Physical Exam GEN: No acute distress CV: Regular rate and rhythm no murmurs LUNGS: Clear to auscultation bilaterally normal respiratory effort SKIN JOINTS: Warm and dry no rash    Data Reviewed: Imaging: CT chest 08/23/2023 Postradiation changes in the right lung base.  Stable 5 mm nodule in the left lung, extensive emphysema I had reviewed the images personally  CTA [[facility]] 02/21/2024 1. No embolism to the proximal subsegmental pulmonary artery level.  2. Stable post treatment changes at the right lung base. No evidence  of local recurrent or metastatic disease.  3. No lung mass, consolidation, pleural effusion or pneumothorax.  4. Multiple other nonacute observations, as described above.   Chest x-ray [facility]] 03/12/2024 Increasing opacities at the left lung base/lingula concerning for  pneumonia.   CT chest [[facility]] 08/31/2024 1.  Increased area of nodularity in the left upper lobe is favored infectious or inflammatory nature. However, recommend attention on short-term follow-up.  2.  Stable post treatment changes in the right lung base.  3.  Background of advanced destructive centrilobular emphysema.   PFTs: 03/20/2018 FVC 3.15 [102%], FEV1 1.78 [76%], F/F57, TLC 7.14 [141%], DLCO 9.52 [39%] Moderate obstruction with air trapping hyperinflation, severe diffusion impairment  09/12/2024 FVC 2.23 [78%], FEV1 0.98 [45%], F/F44, TLC 6.93 [137%], DLCO 5.45 [28%] Severe obstruction with air trapping, hyperinflation, severe diffusion defect  Labs:  Assessment & Plan Severe chronic obstructive pulmonary disease with advanced emphysema Severe COPD with advanced emphysema, evidenced by a decline in FEV1 from 76% in  2019 to 45% currently. CT scan confirms advanced emphysema. Oxygen therapy initiated this year. Continued smoking likely  contributes to disease progression. - Continue Trelegy inhaler. - Continue oxygen therapy. - Order blood test for alpha-1 antitrypsin deficiency.  Tobacco dependence Continued smoking despite severe COPD and emphysema. Discussed smoking cessation to prevent further lung disease progression. Explored options including nicotine patches, gum, and Chantix. Emphasized the additive effect of using both patches and Chantix together.  Time spent counseling-5 minutes reassess at return visit. - Prescribe nicotine patches and Chantix for smoking cessation. - Provide resources for behavioral therapy and hypnosis for smoking cessation.  Pulmonary nodules, left upper lobe, under surveillance Nodularity in the left upper lobe observed on CT scan. Recent antibiotics for suspected pneumonia. Nodules likely inflammatory and related to chronic bronchitis. No current need for additional antibiotics. - Repeat CT scan in six months to monitor pulmonary nodules.   Exposure to Asbestos and Mold History of occupational exposure to asbestos and recent exposure to mold at home. No evidence of interstitial lung disease on recent CT scan. -Continue current management and monitor for any new symptoms.  Non-small cell lung cancer, Diagnosed in 2019 No evidence of recurrence on recent CT scan Follow-up with oncology  Early stage dementia Early stage dementia with memory issues affecting medication adherence. Currently on a morning memory medication, but avoids nighttime medications due to adverse effects like nightmares.  Recommendations: Continue Trelegy Continue supplemental oxygen Smoking cessation with Chantix, nicotine Follow-up scheduled  I personally spent a total of 34 minutes in the care of the patient today including preparing to see the patient, getting/reviewing separately obtained history, performing a medically appropriate exam/evaluation, counseling and educating, placing orders, independently  interpreting results, and communicating results.   Lonna Coder MD [facility] 09/12/2024, 2:55 PM  CC: Elmon Shader, MD

## 2024-09-12 NOTE — Patient Instructions (Signed)
  VISIT SUMMARY: Today, you had a follow-up visit to discuss your severe COPD and emphysema, recent hospitalizations for pneumonia, and ongoing tobacco use. We reviewed your current treatments and made some adjustments to help manage your conditions.  YOUR PLAN: SEVERE CHRONIC OBSTRUCTIVE PULMONARY DISEASE WITH ADVANCED EMPHYSEMA: Your COPD and emphysema have worsened, with your lung function declining from 76% in 2019 to 45% now. You are currently using a Trelegy inhaler and oxygen therapy. -Continue using your Trelegy inhaler as prescribed. -Continue using your oxygen therapy at home and when you go out. -We will do a blood test to check for alpha-1 antitrypsin deficiency.  TOBACCO DEPENDENCE: You are still smoking, which is making your lung disease worse. We discussed ways to help you quit smoking. -We will prescribe nicotine patches and Chantix to help you quit smoking. -We will provide you with resources for behavioral therapy and hypnosis to support your smoking cessation efforts.  PULMONARY NODULES, LEFT UPPER LOBE, UNDER SURVEILLANCE: You have nodules in your left upper lung, likely due to chronic bronchitis. These were seen on your recent CT scan. -We will repeat the CT scan in six months to monitor the nodules.

## 2024-09-12 NOTE — Progress Notes (Signed)
 Full PFT performed today.

## 2024-09-16 ENCOUNTER — Ambulatory Visit: Payer: Self-pay | Admitting: Radiation Oncology

## 2024-09-16 ENCOUNTER — Telehealth (HOSPITAL_COMMUNITY)

## 2024-09-16 ENCOUNTER — Ambulatory Visit (HOSPITAL_COMMUNITY)

## 2024-09-16 DIAGNOSIS — G3184 Mild cognitive impairment, so stated: Secondary | ICD-10-CM

## 2024-09-16 DIAGNOSIS — G4709 Other insomnia: Secondary | ICD-10-CM

## 2024-09-16 DIAGNOSIS — R630 Anorexia: Secondary | ICD-10-CM

## 2024-09-16 MED ORDER — MIRTAZAPINE 15 MG PO TABS: 15.0000 mg | ORAL_TABLET | Freq: Every day | ORAL | 0 refills | Status: DC

## 2024-09-16 NOTE — Progress Notes (Signed)
 Radiation Oncology         [phone] ________________________________  Name: Lindsey Rowland MRN: [id number]  Date: 09/17/2024  DOB: 1951-12-02  Follow-Up Visit Note  CC: Berneta Elsie Sayre, MD  [name], MD  No diagnosis found.  Diagnosis:  Non-Small cell lung cancer, clinical stage I-II     Interval Since Last Radiation:  6 years 1 day   09/10/2018, 09/12/2018, 09/17/2018: Right lung / 54 Gy in 3 fractions (SBRT)    Narrative:  The patient returns today for routine follow-up and to review recent imaging. She was last seen in office on 08/24/23 for a follow up visit. Patient continued to follow up with their specialists to manage their chronic conditions. Current smoker.   In the interval since she was last seen, she was hospitalized in March and April at [facility] for respiratory issues. On 02/21/24 she was admitted with severe respiratory symptoms but was not diagnosed with pneumonia. Two weeks later, she was diagnosed with pneumonia in the right lung and was hospitalized again from 4/8 to 4/11. She was discharged with a 'hospital at home' setup, including supplemental oxygen, which she used for the following three days.    During a follow up with Dr. Theophilus on 5/5 she was switched to Trelegy from Stiolto and Duoneb for nebulizer as a rescue medication.                           She returned to the ED on 08/31/24 for dyspnea. Imaging showed nodularity in the left upper lobe measuring up to 1 cm and signs of pneumonia and stable post-treatment changes at the right base from previous cancer treatment. As such she completed a course of azithromycin and cefproaxime for pneumonia.    No other significant oncologic interval history since the patient was last seen.    Allergies:  is allergic to amoxicillin .  Meds: Current Outpatient Medications  Medication Sig Dispense Refill   albuterol  (VENTOLIN  HFA) 108 (90 Base) MCG/ACT inhaler 1 PUFF EVERY 8 HOURS AS  NEEDED 18 each 10   albuterol  (VENTOLIN  HFA) 108 (90 Base) MCG/ACT inhaler TAKE 2 PUFFS BY MOUTH EVERY 6 HOURS AS NEEDED FOR WHEEZE OR SHORTNESS OF BREATH (Patient not taking: Reported on 09/12/2024) 18 each 6   Aspirin-Salicylamide-Caffeine (BC HEADACHE POWDER PO) Take 1 packet by mouth daily as needed (for pain or headache).      celecoxib  (CELEBREX ) 200 MG capsule TAKE 1 CAPSULE BY MOUTH EVERY DAY AS NEEDED 90 capsule 1   diphenoxylate -atropine  (LOMOTIL ) 2.5-0.025 MG tablet TAKE 1 TABLET BY MOUTH 4 (FOUR) TIMES DAILY AS NEEDED FOR DIARRHEA OR LOOSE STOOLS. 120 tablet 2   divalproex  (DEPAKOTE ) 125 MG DR tablet Take 1 tablet (125 mg total) by mouth at bedtime. 60 tablet 11   donepezil  (ARICEPT ) 10 MG tablet Take 1 tablet (10 mg total) by mouth daily. 90 tablet 3   Fluticasone-Umeclidin-Vilant (TRELEGY ELLIPTA ) 200-62.5-25 MCG/ACT AEPB Inhale 1 puff into the lungs daily. 3 each 3   ipratropium-albuterol  (DUONEB) 0.5-2.5 (3) MG/3ML SOLN Take 3 mLs by nebulization every 6 (six) hours as needed. Diagnosis code:  J44.1, J18.9 360 mL 3   mirtazapine  (REMERON ) 15 MG tablet Take 1 tablet (15 mg total) by mouth at bedtime. 90 tablet 0   mometasone  (NASONEX ) 50 MCG/ACT nasal spray PLACE 2 SPRAYS INTO THE NOSE DAILY. (Patient taking differently: Place 2 sprays into the nose daily. prn) 51 each 4  Multiple Vitamin (MULITIVITAMIN WITH MINERALS) TABS Take 1 tablet by mouth daily.     nicotine (NICODERM CQ - DOSED IN MG/24 HOURS) 14 mg/24hr patch Place 1 patch (14 mg total) onto the skin daily. 28 patch 0   Polyethyl Glycol-Propyl Glycol (SYSTANE OP) Place 1 drop into both eyes daily as needed (for dry eyes).     pregabalin  (LYRICA ) 25 MG capsule TAKE 1 CAPSULE BY MOUTH EVERYDAY AT BEDTIME (Patient not taking: Reported on 09/12/2024) 30 capsule 0   Varenicline Tartrate, Starter, (CHANTIX STARTING MONTH PAK) 0.5 MG X 11 & 1 MG X 42 TBPK Take 1 tablet by mouth daily. 1 each 2   Current Facility-Administered  Medications  Medication Dose Route Frequency Provider Last Rate Last Admin   ipratropium-albuterol  (DUONEB) 0.5-2.5 (3) MG/3ML nebulizer solution 3 mL  3 mL Nebulization Q4H PRN Mannam, Praveen, MD        Physical Findings: The patient is in no acute distress. Patient is alert and oriented.  vitals were not taken for this visit. .  No significant changes. Lungs are clear to auscultation bilaterally. Heart has regular rate and rhythm. No palpable cervical, supraclavicular, or axillary adenopathy. Abdomen soft, non-tender, normal bowel sounds.   Lab Findings: Lab Results  Component Value Date   WBC 3.6 (L) 09/20/2023   HGB 14.3 09/20/2023   HCT 43.6 09/20/2023   MCV 106.5 (H) 09/20/2023   PLT 251.0 09/20/2023    Radiographic Findings: No results found.  Impression:Non-Small cell lung cancer, clinical stage I-II    The patient is recovering from the effects of radiation.  ***  Plan:  ***   *** minutes of total time was spent for this patient encounter, including preparation, face-to-face counseling with the patient and coordination of care, physical exam, and documentation of the encounter. ____________________________________  Lynwood CHARM Nasuti, PhD, MD  This document serves as a record of services personally performed by Lynwood Nasuti, MD. It was created on his behalf by Reymundo Cartwright, a trained medical scribe. The creation of this record is based on the scribe's personal observations and the provider's statements to them. This document has been checked and approved by the attending provider.

## 2024-09-16 NOTE — Addendum Note (Signed)
 Addended by: CARVIN CROCK on: 09/16/2024 04:52 PM   Modules accepted: Level of Service

## 2024-09-17 ENCOUNTER — Ambulatory Visit
Admission: RE | Admit: 2024-09-17 | Discharge: 2024-09-17 | Disposition: A | Discharge status: Home / Self Care | Source: Ambulatory Visit | Attending: Radiation Oncology | Admitting: Radiation Oncology

## 2024-09-17 ENCOUNTER — Encounter: Payer: Self-pay | Admitting: Radiation Oncology

## 2024-09-17 DIAGNOSIS — R911 Solitary pulmonary nodule: Secondary | ICD-10-CM | POA: Diagnosis not present

## 2024-09-17 DIAGNOSIS — Z79899 Other long term (current) drug therapy: Secondary | ICD-10-CM | POA: Diagnosis not present

## 2024-09-17 DIAGNOSIS — Z923 Personal history of irradiation: Secondary | ICD-10-CM | POA: Insufficient documentation

## 2024-09-17 DIAGNOSIS — C3431 Malignant neoplasm of lower lobe, right bronchus or lung: Secondary | ICD-10-CM | POA: Diagnosis not present

## 2024-09-17 DIAGNOSIS — Z791 Long term (current) use of non-steroidal anti-inflammatories (NSAID): Secondary | ICD-10-CM | POA: Diagnosis not present

## 2024-09-17 DIAGNOSIS — Z85118 Personal history of other malignant neoplasm of bronchus and lung: Secondary | ICD-10-CM | POA: Insufficient documentation

## 2024-09-17 DIAGNOSIS — Z7951 Long term (current) use of inhaled steroids: Secondary | ICD-10-CM | POA: Diagnosis not present

## 2024-09-17 DIAGNOSIS — F17210 Nicotine dependence, cigarettes, uncomplicated: Secondary | ICD-10-CM | POA: Diagnosis not present

## 2024-09-17 DIAGNOSIS — C3491 Malignant neoplasm of unspecified part of right bronchus or lung: Secondary | ICD-10-CM

## 2024-09-17 NOTE — Progress Notes (Signed)
 Lindsey Rowland is here today for follow up post radiation to the lung.  Lung Side: Right,patient completed treatment on 09/17/18  Does the patient complain of any of the following: Pain: No Shortness of breath w/wo exertion: yes mostly on exertion. Patient wearing oxygen at home as needed.   Cough: No Hemoptysis: No Pain with swallowing: No Swallowing/choking concerns: No Appetite: Fair,  patient drinking 1 ensure per day.  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

## 2024-09-18 ENCOUNTER — Other Ambulatory Visit: Payer: Self-pay | Admitting: Radiation Oncology

## 2024-09-18 ENCOUNTER — Telehealth: Payer: Self-pay | Admitting: Radiation Oncology

## 2024-09-18 ENCOUNTER — Inpatient Hospital Stay
Admission: RE | Admit: 2024-09-18 | Discharge: 2024-09-18 | Disposition: A | Discharge status: Home / Self Care | Payer: Self-pay | Source: Ambulatory Visit | Attending: Radiation Oncology | Admitting: Radiation Oncology

## 2024-09-18 DIAGNOSIS — C3491 Malignant neoplasm of unspecified part of right bronchus or lung: Secondary | ICD-10-CM

## 2024-09-18 NOTE — Telephone Encounter (Signed)
 10/15 sent via stat fax requested for CT/Xray images to be push to powershare from [facility].  Waiting on images.

## 2024-09-19 ENCOUNTER — Telehealth: Payer: Self-pay | Admitting: Radiation Oncology

## 2024-09-19 LAB — ALPHA-1 ANTITRYPSIN PHENOTYPE: A-1 Antitrypsin, Ser: 175 mg/dL (ref 83–199)

## 2024-09-19 NOTE — Telephone Encounter (Signed)
 10/16 Follow up call to [facility] spoke to [place].  Waiting on images

## 2024-09-20 ENCOUNTER — Telehealth: Payer: Self-pay | Admitting: Radiation Oncology

## 2024-09-20 NOTE — Telephone Encounter (Addendum)
 10/17 Follow up call to [facility] for images to be push to [facility] in [facility], apparently [facility] is doing something different concerning sending images in/through [employer]hare -per [facility]) [place] ([facility]).  Requested images to be pushed to both [facility] and [facility]).  F/U call also to [facility]/waiting on CT images.

## 2024-09-26 ENCOUNTER — Ambulatory Visit: Payer: Self-pay | Admitting: Pulmonary Disease

## 2024-10-02 NOTE — Progress Notes (Signed)
 Psychiatric Follow Up  Patient Identification: Lindsey Rowland MRN:  [id number] Date of Evaluation:  10/14/2024 Referral Source: Neurology  Televisit via video: I connected with patient on 10/14/24 at  4:00 PM EDT by a video enabled telemedicine application and verified that I am speaking with the correct person using two identifiers  Location: Patient: Home Provider: Clinic   I discussed the limitations of evaluation and management by telemedicine and the availability of in person appointments. The patient expressed understanding and agreed to proceed.  I discussed the assessment and treatment plan with the patient. The patient was provided an opportunity to ask questions and all were answered. The patient agreed with the plan and demonstrated an understanding of the instructions.   The patient was advised to call back or seek an in-person evaluation if the symptoms worsen or if the condition fails to improve as anticipated.  Assessment:  Lindsey Rowland is a 73 y.o. female with a history of insomnia, MNCD, alcohol use disorder, reported depression, insomnia, reduced appetite who presents for follow-up appointment today.  Her cellular number is [phone], which will be used for future virtual appointments.  Since the last visit she has continued to be at her baseline.  She is coping well after the passing away of her husband.  Her depression and anxiety have been well-controlled, she has been sleeping well though her appetite has continued to be poor.  She has been compliant with all the medications, has had no side effects or new physical concerns.  Alcohol use has decreased due to the temperature fall outside but her cigarette smoking has continued.  She is going to try nicotine patches/gums to sensate from smoking cigarettes.  She is not using any other substances.  She is not actively or passively suicidal or homicidal.  Her memory has deteriorated which her granddaughter was able to  confirm, she has been followed by neurology.  Encouraged her to do some crossword's, puzzles, she was amenable to the plan.  No safety issues reported or observed, granddaughter is helping her with daily chores.  We discussed about continuing the same medication, plan to follow-up with her in 2 months.  Risk Assessment: An assessment of suicide and violence risk factors was performed as part of this evaluation and is not  significantly changed from the last visit. While future psychiatric events cannot be accurately predicted, the patient does not  currently require acute inpatient psychiatric care and does not  currently meet [place]  involuntary commitment criteria. Patient was given contact information for crisis resources, behavioral health clinic and was instructed to call 911 for emergencies   Plan:  # MNCD Past medication trials: Donepezil , Depakote  Status of problem: Chronic Interventions: -- Patient is on donepezil  10 mg daily, encouraged compliance, managed by neurologist -- Continue Depakote  125 mg at bedtime for mood, managed by her neurologist -- Continue mirtazapine  to 15 mg nightly -- Encouraged to stop drinking alcohol and quit smoking  # Anorexia Past medication trials: Mirtazapine  Status of problem: Chronic/current Interventions: -- Continue mirtazapine  to 15 mg nightly  # Insomnia Past medication trials: Mirtazapine  Status of problem: Current Interventions: -- Continue mirtazapine  to 15 mg nightly -- Encouraged sleep hygiene -- Encouraged to stop drinking alcohol and quit smoking  Return to care in 2 months  Patient was given contact information for behavioral health clinic and was instructed to call 911 for emergencies.    Patient and plan of care will be discussed with the Attending MD ,Dr. Carvin, who  agrees with the above statement and plan.   Subjective:  Chief Complaint:  Chief Complaint  Patient presents with   Medication Refill   Follow-up    Insomnia    History of Present Illness:   Lindsey Rowland is a 73 y.o. female with a history of insomnia, MNCD, alcohol use disorder, reported depression, insomnia, reduced appetite who presents virtually to [facility] for follow-up of behavioral problems/memory issues.  Patient continues to be followed by neurology for mood stabilization and memory impairment with donepezil  and Depakote . Today, she reported her mood as good .  She denied any active or passive SI/HI/AVH, reported good sleep.  She reported her appetite as half-and-half , stated that she had a breakfast today, lunch and she is trying to have her dinner as well.  Encouraged her to continue eating more.  Reported that the medications have been helpful for her, denied any side effects or any new physical concerns.  She denied feeling depressed or anxious.  Reported that she has been drinking less during [place], only drank 1-2 times over the past 1 month.  She continues to smoke 3 cigarettes a day encouraged her to quit smoking.  Her granddaughter stated that her memory has been worsening, reiterated the importance of stop smoking, doing some mental exercises including crossword's, puzzles, they were amenable to the plan.  She denied using any other substances.  She reported that she has been trying to cope well after the passing away of her husband.  Encouraged her to continue taking the prescription as prescribed, prescription sent to the preferred pharmacy, plan to have her back in 2 months.  Past Psychiatric History:  Diagnoses: insomnia, MNCD, alcohol use disorder, reported depression, insomnia, reduced appetite  Medication trials: Depakote , donepezil , Remeron  Previous psychiatrist/therapist: None Hospitalizations: None Suicide attempts: None SIB: Not available Hx of violence towards others: None Current access to guns: Denies Hx of trauma/abuse: Denies  Substance Abuse History in the last 12 months:   No.  Past Medical History:  Past Medical History:  Diagnosis Date   Actinic keratoses 01/14/2021   Allergic rhinitis 12/10/2008   Arthropathy of ankle and foot 06/10/2009   Chronic obstructive pulmonary disease 07/25/2007   Coronary artery calcification 01/22/2018   Diverticulitis of colon (without mention of hemorrhage) 07/15/2008   Elevated alcohol use    Epiretinal membrane 05/08/2011   Gastroesophageal reflux disease 11/08/2021   Generalized anxiety disorder 10/16/2007   Headache    History of poliomyelitis    History of radiation therapy 09/10/2018-09/17/2018   SBRT 3 fx to right lung; Dr. Lynwood Nasuti   Hypothyroidism 06/09/2023   Intercostal pain 05/04/2021   Irritable bowel syndrome 03/09/2010   Lentigo 01/14/2021   Malignant neoplasm of unspecified part of unspecified bronchus or lung 06/09/2023   Mass of chest wall, right 05/04/2021   Mild cognitive impairment with concerns for Alzheimer's disease 10/13/2023   Mild protein-calorie malnutrition 06/09/2023   Non-small cell cancer of right lung 04/16/2018   Nuclear cataract 02/23/2012   Osteoarthritis 12/10/2008   Osteopenia    Restless legs syndrome 01/20/2017   Retinal detachment of right eye with retinal break 02/23/2012   Seborrheic keratoses 01/14/2021   Spasm of muscle 06/29/2010   Status post intraocular lens implant 02/23/2012   Tobacco use 06/29/2010   Ulcer    stomach hx    Past Surgical History:  Procedure Laterality Date   APPENDECTOMY     BREAST BIOPSY Right 2013   CATARACT EXTRACTION  right   DILATION AND CURETTAGE OF UTERUS     FOOT SURGERY Bilateral    hip si joint     injection/ablation   RETINAL DETACHMENT SURGERY  12/2010   right eye   TONSILLECTOMY     VIDEO BRONCHOSCOPY WITH ENDOBRONCHIAL NAVIGATION Right 08/01/2018   Procedure: VIDEO BRONCHOSCOPY WITH ENDOBRONCHIAL NAVIGATION Fiducial Placement;  Surgeon: Shelah Lamar RAMAN, MD;  Location: [facility] OR;  Service: Thoracic;  Laterality: Right;     Family Psychiatric History: Alzheimer's Disease in mother/unable to describe the psychiatric symptoms  Family History:  Family History  Problem Relation Age of Onset   Dementia Mother    Hiatal hernia Father    Mental illness Maternal Aunt    Suicidality Maternal Aunt    Colon cancer Neg Hx    Esophageal cancer Neg Hx    Stomach cancer Neg Hx    Rectal cancer Neg Hx     Social History:   Academic/Vocational: None Social History   Socioeconomic History   Marital status: Married    Spouse name: Not on file   Number of children: 2   Years of education: 13   Highest education level: Some college, no degree  Occupational History   Occupation: Retired  Tobacco Use   Smoking status: Every Day    Current packs/day: 0.25    Average packs/day: 0.3 packs/day for 40.0 years (10.0 ttl pk-yrs)    Types: Cigarettes   Smokeless tobacco: Never   Tobacco comments:    currently smoking 2-3 cigarettes a day  Vaping Use   Vaping status: Never Used  Substance and Sexual Activity   Alcohol use: Yes    Alcohol/week: 14.0 - 21.0 standard drinks of alcohol    Types: 14 - 21 Cans of beer per week    Comment: 2-3 beers daily   Drug use: No   Sexual activity: Never  Other Topics Concern   Not on file  Social History Narrative   Right handed   Drinks caffeine prn   Lives with husband   [place]   retired   Chief Executive Officer Drivers of Corporate Investment Banker Strain: Medium Risk (06/27/2024)   Overall Financial Resource Strain (CARDIA)    Difficulty of Paying Living Expenses: Somewhat hard  Food Insecurity: Food Insecurity Present (06/27/2024)   Hunger Vital Sign    Worried About Running Out of Food in the Last Year: Sometimes true    Ran Out of Food in the Last Year: Sometimes true  Transportation Needs: No Transportation Needs (06/27/2024)   PRAPARE - Administrator, Civil Service (Medical): No    Lack of Transportation (Non-Medical): No  Physical Activity: Inactive  (09/25/2023)   Exercise Vital Sign    Days of Exercise per Week: 0 days    Minutes of Exercise per Session: 0 min  Stress: Stress Concern Present (06/27/2024)   [employer] of Occupational Health - Occupational Stress Questionnaire    Feeling of Stress: Very much  Social Connections: Unknown (09/25/2023)   Social Connection and Isolation Panel    Frequency of Communication with Friends and Family: More than three times a week    Frequency of Social Gatherings with Friends and Family: Once a week    Attends Religious Services: Not on Marketing Executive or Organizations: No    Attends Banker Meetings: Never    Marital Status: Married    Additional Social History: updated  Allergies:   Allergies  Allergen Reactions   Amoxicillin  Diarrhea and Itching    Current Medications: Current Outpatient Medications  Medication Sig Dispense Refill   albuterol  (VENTOLIN  HFA) 108 (90 Base) MCG/ACT inhaler 1 PUFF EVERY 8 HOURS AS NEEDED 18 each 10   albuterol  (VENTOLIN  HFA) 108 (90 Base) MCG/ACT inhaler TAKE 2 PUFFS BY MOUTH EVERY 6 HOURS AS NEEDED FOR WHEEZE OR SHORTNESS OF BREATH (Patient not taking: Reported on 09/12/2024) 18 each 6   Aspirin-Salicylamide-Caffeine (BC HEADACHE POWDER PO) Take 1 packet by mouth daily as needed (for pain or headache).      celecoxib  (CELEBREX ) 200 MG capsule TAKE 1 CAPSULE BY MOUTH EVERY DAY AS NEEDED 90 capsule 1   diphenoxylate -atropine  (LOMOTIL ) 2.5-0.025 MG tablet TAKE 1 TABLET BY MOUTH 4 (FOUR) TIMES DAILY AS NEEDED FOR DIARRHEA OR LOOSE STOOLS. 120 tablet 2   divalproex  (DEPAKOTE ) 125 MG DR tablet Take 1 tablet (125 mg total) by mouth at bedtime. 60 tablet 11   donepezil  (ARICEPT ) 10 MG tablet Take 1 tablet (10 mg total) by mouth daily. 90 tablet 3   Fluticasone-Umeclidin-Vilant (TRELEGY ELLIPTA ) 200-62.5-25 MCG/ACT AEPB Inhale 1 puff into the lungs daily. 3 each 3   ipratropium-albuterol  (DUONEB) 0.5-2.5 (3) MG/3ML SOLN Take 3  mLs by nebulization every 6 (six) hours as needed. Diagnosis code:  J44.1, J18.9 360 mL 3   mirtazapine  (REMERON ) 15 MG tablet Take 1 tablet (15 mg total) by mouth at bedtime. 90 tablet 0   mometasone  (NASONEX ) 50 MCG/ACT nasal spray PLACE 2 SPRAYS INTO THE NOSE DAILY. (Patient taking differently: Place 2 sprays into the nose daily. prn) 51 each 4   Multiple Vitamin (MULITIVITAMIN WITH MINERALS) TABS Take 1 tablet by mouth daily.     nicotine (NICODERM CQ - DOSED IN MG/24 HOURS) 14 mg/24hr patch Place 1 patch (14 mg total) onto the skin daily. 28 patch 0   Polyethyl Glycol-Propyl Glycol (SYSTANE OP) Place 1 drop into both eyes daily as needed (for dry eyes).     pregabalin  (LYRICA ) 25 MG capsule TAKE 1 CAPSULE BY MOUTH EVERYDAY AT BEDTIME (Patient not taking: Reported on 09/12/2024) 30 capsule 0   Varenicline Tartrate, Starter, (CHANTIX STARTING MONTH PAK) 0.5 MG X 11 & 1 MG X 42 TBPK Take 1 tablet by mouth daily. 1 each 2   Current Facility-Administered Medications  Medication Dose Route Frequency Provider Last Rate Last Admin   ipratropium-albuterol  (DUONEB) 0.5-2.5 (3) MG/3ML nebulizer solution 3 mL  3 mL Nebulization Q4H PRN Mannam, Praveen, MD        ROS: Review of Systems  Constitutional:  Negative for activity change, appetite change, chills, diaphoresis and fatigue.  HENT:  Negative for congestion, dental problem, drooling, ear discharge and ear pain.   Eyes:  Negative for pain, discharge and itching.  Respiratory:  Negative for apnea, cough, choking and chest tightness.   Cardiovascular:  Negative for chest pain, palpitations and leg swelling.  Gastrointestinal:  Negative for abdominal distention, abdominal pain, constipation, diarrhea and nausea.  Endocrine: Negative for cold intolerance and heat intolerance.  Genitourinary:  Negative for difficulty urinating, dysuria, flank pain, frequency, hematuria and urgency.  Musculoskeletal:  Negative for arthralgias, back pain, gait problem,  joint swelling, myalgias and neck pain.  Skin:  Negative for color change and pallor.  Allergic/Immunologic: Negative for environmental allergies and food allergies.  Neurological:  Negative for dizziness, seizures, syncope, facial asymmetry, speech difficulty, light-headedness, numbness and headaches.  Psychiatric/Behavioral:  Negative for agitation, behavioral problems, confusion, decreased concentration, dysphoric mood, hallucinations,  self-injury, sleep disturbance and suicidal ideas. The patient is not nervous/anxious and is not hyperactive.     Objective:  Psychiatric Specialty Exam: There were no vitals taken for this visit.There is no height or weight on file to calculate BMI.  General Appearance: Casual  Eye Contact:  Good  Speech:  Normal Rate  Volume:  Normal  Mood:  Euthymic  Affect:  Congruent  Thought Content: Logical   Suicidal Thoughts:  No  Homicidal Thoughts:  No  Thought Process:  Linear  Orientation:  Full (Time, Place, and Person)    Memory: Immediate;   Good Recent;   Fair Remote;   Fair  Judgment:  Fair  Insight:  Fair  Concentration:  Concentration: Good and Attention Span: Good  Recall:  not formally assessed  Fund of Knowledge: Good  Language: Good  Psychomotor Activity:  Normal  Akathisia:  No  AIMS (if indicated): not done  Assets:  Communication Skills Desire for Improvement Financial Resources/Insurance Housing Leisure Time Resilience Social Support Transportation  ADL's:  Intact  Cognition: WNL  Sleep:  Good   PE: General: well-appearing; no acute distress  Pulm: no increased work of breathing on room air  Strength & Muscle Tone: within normal limits Neuro: no focal neurological deficits observed  Gait & Station: normal  Metabolic Disorder Labs: No results found for: HGBA1C, MPG No results found for: PROLACTIN Lab Results  Component Value Date   CHOL 163 03/31/2021   TRIG 46.0 03/31/2021   HDL 73.60 03/31/2021    CHOLHDL 2 03/31/2021   VLDL 9.2 03/31/2021   LDLCALC 80 03/31/2021   LDLCALC 104 (H) 01/22/2018   Lab Results  Component Value Date   TSH 0.46 05/15/2023    Therapeutic Level Labs: No results found for: LITHIUM No results found for: CBMZ No results found for: VALPROATE  Screenings:  GAD-7    Flowsheet Row Office Visit from 07/08/2020 in [facility] [place]  Total GAD-7 Score 0   PHQ2-9    Flowsheet Row Office Visit from 07/15/2024 in [facility] Patient Outreach Telephone from 06/27/2024 in [place] Office Visit from 06/17/2024 in [facility] Care Coordination from 11/23/2023 in [facility] Care Coordination Clinical Support from 09/25/2023 in [facility] [employer]  PHQ-2 Total Score 0 0 0 2 0  PHQ-9 Total Score -- -- -- 7 --    Collaboration of Care: Collaboration of Care: Other Dr. Carvin  Patient/Guardian was advised Release of Information must be obtained prior to any record release in order to collaborate their care with an outside provider. Patient/Guardian was advised if they have not already done so to contact the registration department to sign all necessary forms in order for us  to release information regarding their care.   Consent: Patient/Guardian gives verbal consent for treatment and assignment of benefits for services provided during this visit. Patient/Guardian expressed understanding and agreed to proceed.   Shaquaya Wuellner, MD 11/10/20253:57 PM

## 2024-10-11 ENCOUNTER — Telehealth: Payer: Self-pay | Admitting: *Deleted

## 2024-10-11 NOTE — Telephone Encounter (Signed)
 Called patient to inform of CT for 12-18-24- arrival time- 10:45 am @ [facility], no restrictions to scan, patient to receive results from Dr. Shannon on 12/26/24 @ 11:30 am, spoke with patient's daughter -Lindsey Rowland and she is aware of these appts. and the instructions

## 2024-10-14 ENCOUNTER — Encounter (HOSPITAL_COMMUNITY): Payer: Self-pay

## 2024-10-14 ENCOUNTER — Telehealth (HOSPITAL_BASED_OUTPATIENT_CLINIC_OR_DEPARTMENT_OTHER)

## 2024-10-14 DIAGNOSIS — G4709 Other insomnia: Secondary | ICD-10-CM

## 2024-10-14 DIAGNOSIS — R630 Anorexia: Secondary | ICD-10-CM | POA: Diagnosis not present

## 2024-10-14 DIAGNOSIS — G3184 Mild cognitive impairment, so stated: Secondary | ICD-10-CM | POA: Diagnosis not present

## 2024-10-14 MED ORDER — MIRTAZAPINE 15 MG PO TABS: 15.0000 mg | ORAL_TABLET | Freq: Every day | ORAL | 0 refills | Status: DC

## 2024-10-14 NOTE — Addendum Note (Signed)
 Addended by: CARVIN CROCK on: 10/14/2024 04:46 PM   Modules accepted: Level of Service

## 2024-10-24 ENCOUNTER — Ambulatory Visit (HOSPITAL_BASED_OUTPATIENT_CLINIC_OR_DEPARTMENT_OTHER)
Admission: RE | Admit: 2024-10-24 | Discharge: 2024-10-24 | Disposition: A | Discharge status: Home / Self Care | Source: Ambulatory Visit | Attending: Family Medicine | Admitting: Family Medicine

## 2024-10-24 ENCOUNTER — Other Ambulatory Visit (HOSPITAL_BASED_OUTPATIENT_CLINIC_OR_DEPARTMENT_OTHER): Payer: Self-pay

## 2024-10-24 ENCOUNTER — Ambulatory Visit (HOSPITAL_BASED_OUTPATIENT_CLINIC_OR_DEPARTMENT_OTHER): Payer: Self-pay | Admitting: Family Medicine

## 2024-10-24 ENCOUNTER — Encounter (HOSPITAL_BASED_OUTPATIENT_CLINIC_OR_DEPARTMENT_OTHER): Payer: Self-pay

## 2024-10-24 ENCOUNTER — Telehealth: Payer: Self-pay

## 2024-10-24 ENCOUNTER — Ambulatory Visit (INDEPENDENT_AMBULATORY_CARE_PROVIDER_SITE_OTHER): Admit: 2024-10-24 | Discharge: 2024-10-24 | Disposition: A | Discharge status: Home / Self Care | Admitting: Radiology

## 2024-10-24 VITALS — BP 128/84 | HR 67 | Temp 98.2°F | Resp 20

## 2024-10-24 DIAGNOSIS — R051 Acute cough: Secondary | ICD-10-CM | POA: Diagnosis not present

## 2024-10-24 DIAGNOSIS — R0602 Shortness of breath: Secondary | ICD-10-CM | POA: Diagnosis not present

## 2024-10-24 DIAGNOSIS — J441 Chronic obstructive pulmonary disease with (acute) exacerbation: Secondary | ICD-10-CM | POA: Diagnosis not present

## 2024-10-24 DIAGNOSIS — J439 Emphysema, unspecified: Secondary | ICD-10-CM | POA: Diagnosis not present

## 2024-10-24 DIAGNOSIS — R059 Cough, unspecified: Secondary | ICD-10-CM | POA: Diagnosis not present

## 2024-10-24 DIAGNOSIS — M8448XA Pathological fracture, other site, initial encounter for fracture: Secondary | ICD-10-CM | POA: Diagnosis not present

## 2024-10-24 LAB — POC COVID19/FLU A&B COMBO
Covid Antigen, POC: NEGATIVE
Influenza A Antigen, POC: NEGATIVE
Influenza B Antigen, POC: NEGATIVE

## 2024-10-24 MED ORDER — METHYLPREDNISOLONE ACETATE 80 MG/ML IJ SUSP
80.0000 mg | Freq: Once | INTRAMUSCULAR | Status: AC
  Administered 2024-10-24: 80 mg via INTRAMUSCULAR

## 2024-10-24 MED ORDER — PROMETHAZINE-DM 6.25-15 MG/5ML PO SYRP
5.0000 mL | ORAL_SOLUTION | Freq: Four times a day (QID) | ORAL | 0 refills | Status: DC | PRN
  Filled 2024-10-24: qty 118, 6d supply, fill #0

## 2024-10-24 NOTE — ED Triage Notes (Signed)
 Pt c/o coughing and runny nose she has a history of pneumonia and COPD. Daughter wanted her to come get checked out. Pt is a smoker and she is on oxygen

## 2024-10-24 NOTE — Telephone Encounter (Signed)
 Spoke with daughter/EC regarding concerns of UC visit diagnosis and tx. Was able gt pt scheduled with Rosina Senters, NP on next Tuesday. Daughter thanked me for calling and getting appt scheduled. No further concerns at this time.

## 2024-10-24 NOTE — Discharge Instructions (Addendum)
 COPD exacerbation with cough and shortness of breath: Rapid flu and COVID are negative.  Chest x-ray is slightly hazy consistent with COPD emphysema but no consolidation is seen (no specific sign of pneumonia).  Will update the patient once the radiologist has read the x-ray.  Depo-Medrol  80 mg injection now (this is a steroid).  Get plenty of fluids and rest.  Continue inhalers.  Continue home oxygen.  Promethazine DM, 5 mL, every 6 hours if needed for cough.  Follow-up with primary care or return here if symptoms do not improve, worsen or new symptoms occur.

## 2024-10-24 NOTE — ED Provider Notes (Signed)
 [facility]    CSN: [id number] Arrival date & time: 10/24/24  1016      History   Chief Complaint Chief Complaint  Patient presents with   Cough    Harsh coughing, Runny nose, history of pneumonia and COPD. Kay's daughter Eligha scheduled this appt. Call her at [phone] if you have any questions. Elvira has a cognitive impairment. - Entered by patient    HPI HADDY MULLINAX is a 73 y.o. female.   73 year old female with complaint of cough, runny nose, congestion.  Symptoms started acutely on 10/22/2024.  She has COPD emphysema and uses home oxygen .  Per her daughter she normally uses the oxygen  at 2 L/min and uses it intermittently.  More recently she is using it all the time and is still been short of breath.  She has a very wet cough.  Her daughter is concerned that she may have pneumonia.  She has had pneumonia that was hospitalizing at least once already in 2025.  It was proven to have cleared but she has a tendency towards pneumonia and her daughter is concerned that she may have it again.  The patient is not able to walk a lot without being very short of breath and requested a wheelchair.   Cough Associated symptoms: rhinorrhea, shortness of breath and wheezing   Associated symptoms: no chest pain, no chills, no ear pain, no fever, no rash and no sore throat     Past Medical History:  Diagnosis Date   Actinic keratoses 01/14/2021   Allergic rhinitis 12/10/2008   Arthropathy of ankle and foot 06/10/2009   Chronic obstructive pulmonary disease 07/25/2007   Coronary artery calcification 01/22/2018   Diverticulitis of colon (without mention of hemorrhage) 07/15/2008   Elevated alcohol use    Epiretinal membrane 05/08/2011   Gastroesophageal reflux disease 11/08/2021   Generalized anxiety disorder 10/16/2007   Headache    History of poliomyelitis    History of radiation therapy 09/10/2018-09/17/2018   SBRT 3 fx to right lung; Dr. Lynwood Nasuti    Hypothyroidism 06/09/2023   Intercostal pain 05/04/2021   Irritable bowel syndrome 03/09/2010   Lentigo 01/14/2021   Malignant neoplasm of unspecified part of unspecified bronchus or lung 06/09/2023   Mass of chest wall, right 05/04/2021   Mild cognitive impairment with concerns for Alzheimer's disease 10/13/2023   Mild protein-calorie malnutrition 06/09/2023   Non-small cell cancer of right lung 04/16/2018   Nuclear cataract 02/23/2012   Osteoarthritis 12/10/2008   Osteopenia    Restless legs syndrome 01/20/2017   Retinal detachment of right eye with retinal break 02/23/2012   Seborrheic keratoses 01/14/2021   Spasm of muscle 06/29/2010   Status post intraocular lens implant 02/23/2012   Tobacco use 06/29/2010   Ulcer    stomach hx    Patient Active Problem List   Diagnosis Date Noted   Lack of appetite 06/17/2024   Mild cognitive impairment with concerns for Alzheimer's disease 10/13/2023   Mild protein-calorie malnutrition 06/09/2023   Malignant neoplasm of unspecified part of unspecified bronchus or lung 06/09/2023   Hypothyroidism 06/09/2023   Elevated alcohol use    Gastroesophageal reflux disease 11/08/2021   Mass of chest wall, right 05/04/2021   Seborrheic keratoses 01/14/2021   Lentigo 01/14/2021   Actinic keratoses 01/14/2021   Non-small cell cancer of right lung 04/16/2018   Coronary artery calcification 01/22/2018   Restless legs syndrome 01/20/2017   Status post intraocular lens implant 02/23/2012   Nuclear cataract 02/23/2012  Epiretinal membrane 05/08/2011   Tobacco use 06/29/2010   History of poliomyelitis    Irritable bowel syndrome 03/09/2010   Arthropathy of ankle and foot 06/10/2009   Allergic rhinitis 12/10/2008   Osteoarthritis 12/10/2008   Generalized anxiety disorder 10/16/2007   Chronic obstructive pulmonary disease 07/25/2007    Past Surgical History:  Procedure Laterality Date   APPENDECTOMY     BREAST BIOPSY Right 2013   CATARACT  EXTRACTION     right   DILATION AND CURETTAGE OF UTERUS     FOOT SURGERY Bilateral    hip si joint     injection/ablation   RETINAL DETACHMENT SURGERY  12/2010   right eye   TONSILLECTOMY     VIDEO BRONCHOSCOPY WITH ENDOBRONCHIAL NAVIGATION Right 08/01/2018   Procedure: VIDEO BRONCHOSCOPY WITH ENDOBRONCHIAL NAVIGATION Fiducial Placement;  Surgeon: Shelah Lamar RAMAN, MD;  Location: [facility] OR;  Service: Thoracic;  Laterality: Right;    OB History   No obstetric history on file.      Home Medications    Prior to Admission medications   Medication Sig Start Date End Date Taking? Authorizing Provider  celecoxib  (CELEBREX ) 200 MG capsule TAKE 1 CAPSULE BY MOUTH EVERY DAY AS NEEDED 05/16/24  Yes Berneta Elsie Sayre, MD  divalproex  (DEPAKOTE ) 125 MG DR tablet Take 1 tablet (125 mg total) by mouth at bedtime. 04/24/24  Yes Wertman, Sara E, PA-C  donepezil  (ARICEPT ) 10 MG tablet Take 1 tablet (10 mg total) by mouth daily. 04/24/24  Yes Wertman, Sara E, PA-C  Fluticasone-Umeclidin-Vilant (TRELEGY ELLIPTA ) 200-62.5-25 MCG/ACT AEPB Inhale 1 puff into the lungs daily. 04/08/24 04/08/25 Yes Mannam, Praveen, MD  mirtazapine  (REMERON ) 15 MG tablet Take 1 tablet (15 mg total) by mouth at bedtime. 10/14/24  Yes Kapoor, Sahil, MD  OXYGEN Inhale 2 L/min into the lungs continuous as needed (shortness of breath).   Yes [provider]  promethazine-dextromethorphan (PROMETHAZINE-DM) 6.25-15 MG/5ML syrup Take 5 mLs by mouth 4 (four) times daily as needed for cough. Do not use and drive - May make drowsy. 10/24/24  Yes Ival Domino, FNP  albuterol  (VENTOLIN  HFA) 108 (90 Base) MCG/ACT inhaler 1 PUFF EVERY 8 HOURS AS NEEDED 11/10/23   Mannam, Praveen, MD  Aspirin-Salicylamide-Caffeine (BC HEADACHE POWDER PO) Take 1 packet by mouth daily as needed (for pain or headache).     [provider]  diphenoxylate -atropine  (LOMOTIL ) 2.5-0.025 MG tablet TAKE 1 TABLET BY MOUTH 4 (FOUR) TIMES DAILY AS NEEDED FOR  DIARRHEA OR LOOSE STOOLS. 08/15/22   Berneta Elsie Sayre, MD  ipratropium-albuterol  (DUONEB) 0.5-2.5 (3) MG/3ML SOLN Take 3 mLs by nebulization every 6 (six) hours as needed. Diagnosis code:  J44.1, J18.9 04/19/24   Mannam, Praveen, MD  mometasone  (NASONEX ) 50 MCG/ACT nasal spray PLACE 2 SPRAYS INTO THE NOSE DAILY. Patient taking differently: Place 2 sprays into the nose daily. prn 02/05/24   Berneta Elsie Sayre, MD  Multiple Vitamin (MULITIVITAMIN WITH MINERALS) TABS Take 1 tablet by mouth daily.    [provider]  nicotine (NICODERM CQ - DOSED IN MG/24 HOURS) 14 mg/24hr patch Place 1 patch (14 mg total) onto the skin daily. 09/12/24   Mannam, Praveen, MD  Polyethyl Glycol-Propyl Glycol (SYSTANE OP) Place 1 drop into both eyes daily as needed (for dry eyes).    [provider]  Varenicline Tartrate, Starter, (CHANTIX STARTING MONTH PAK) 0.5 MG X 11 & 1 MG X 42 TBPK Take 1 tablet by mouth daily. 09/12/24   Mannam, Praveen, MD  Family History Family History  Problem Relation Age of Onset   Dementia Mother    Hiatal hernia Father    Mental illness Maternal Aunt    Suicidality Maternal Aunt    Colon cancer Neg Hx    Esophageal cancer Neg Hx    Stomach cancer Neg Hx    Rectal cancer Neg Hx     Social History Social History   Tobacco Use   Smoking status: Every Day    Current packs/day: 0.25    Average packs/day: 0.3 packs/day for 40.0 years (10.0 ttl pk-yrs)    Types: Cigarettes   Smokeless tobacco: Never   Tobacco comments:    currently smoking 2-3 cigarettes a day  Vaping Use   Vaping status: Never Used  Substance Use Topics   Alcohol use: Yes    Alcohol/week: 14.0 - 21.0 standard drinks of alcohol    Types: 14 - 21 Cans of beer per week    Comment: 2-3 beers daily   Drug use: No     Allergies   Amoxicillin    Review of Systems Review of Systems  Constitutional:  Positive for fatigue. Negative for chills and fever.  HENT:  Positive for congestion,  postnasal drip and rhinorrhea. Negative for ear pain and sore throat.   Eyes:  Negative for pain and visual disturbance.  Respiratory:  Positive for cough, chest tightness, shortness of breath and wheezing.   Cardiovascular:  Negative for chest pain and palpitations.  Gastrointestinal:  Negative for abdominal pain, constipation, diarrhea, nausea and vomiting.  Genitourinary:  Negative for dysuria and hematuria.  Musculoskeletal:  Negative for arthralgias and back pain.  Skin:  Negative for color change and rash.  Neurological:  Negative for seizures and syncope.  All other systems reviewed and are negative.    Physical Exam Triage Vital Signs ED Triage Vitals  Encounter Vitals Group     BP 10/24/24 1040 128/84     Girls Systolic BP Percentile --      Girls Diastolic BP Percentile --      Boys Systolic BP Percentile --      Boys Diastolic BP Percentile --      Pulse Rate 10/24/24 1040 67     Resp 10/24/24 1040 20     Temp 10/24/24 1040 98.2 F (36.8 C)     Temp Source 10/24/24 1040 Oral     SpO2 10/24/24 1040 97 %     Weight --      Height --      Head Circumference --      Peak Flow --      Pain Score 10/24/24 1039 0     Pain Loc --      Pain Education --      Exclude from Growth Chart --    No data found.  Updated Vital Signs BP 128/84 (BP Location: Right Arm)   Pulse 67   Temp 98.2 F (36.8 C) (Oral)   Resp 20   SpO2 97%   Visual Acuity Right Eye Distance:   Left Eye Distance:   Bilateral Distance:    Right Eye Near:   Left Eye Near:    Bilateral Near:     Physical Exam Vitals and nursing note reviewed.  Constitutional:      General: She is not in acute distress.    Appearance: She is well-developed and underweight. She is ill-appearing. She is not toxic-appearing or diaphoretic.  HENT:     Head: Normocephalic and atraumatic.  Right Ear: Hearing, tympanic membrane, ear canal and external ear normal.     Left Ear: Hearing, tympanic membrane, ear  canal and external ear normal.     Nose: Congestion and rhinorrhea present. Rhinorrhea is clear.     Right Sinus: No maxillary sinus tenderness or frontal sinus tenderness.     Left Sinus: No maxillary sinus tenderness or frontal sinus tenderness.     Mouth/Throat:     Lips: Pink.     Mouth: Mucous membranes are moist.     Pharynx: Uvula midline. No oropharyngeal exudate or posterior oropharyngeal erythema.     Tonsils: No tonsillar exudate.  Eyes:     Conjunctiva/sclera: Conjunctivae normal.     Pupils: Pupils are equal, round, and reactive to light.  Cardiovascular:     Rate and Rhythm: Normal rate and regular rhythm.     Heart sounds: S1 normal and S2 normal. No murmur heard. Pulmonary:     Effort: Pulmonary effort is normal. No respiratory distress.     Breath sounds: Examination of the right-upper field reveals decreased breath sounds and wheezing. Examination of the left-upper field reveals decreased breath sounds and wheezing. Examination of the right-middle field reveals decreased breath sounds and wheezing. Examination of the left-middle field reveals decreased breath sounds and wheezing. Examination of the right-lower field reveals decreased breath sounds and wheezing. Examination of the left-lower field reveals decreased breath sounds and wheezing. Decreased breath sounds (Breath sounds are diminished throughout.) and wheezing (Mild inspiratory wheezes throughout.) present. No rhonchi or rales.  Abdominal:     General: Bowel sounds are normal.     Palpations: Abdomen is soft.     Tenderness: There is no abdominal tenderness.  Musculoskeletal:        General: No swelling.     Cervical back: Neck supple.  Lymphadenopathy:     Head:     Right side of head: No submental, submandibular, tonsillar, preauricular or posterior auricular adenopathy.     Left side of head: No submental, submandibular, tonsillar, preauricular or posterior auricular adenopathy.     Cervical: No cervical  adenopathy.     Right cervical: No superficial cervical adenopathy.    Left cervical: No superficial cervical adenopathy.  Skin:    General: Skin is warm and dry.     Capillary Refill: Capillary refill takes less than 2 seconds.     Findings: No rash.  Neurological:     Mental Status: She is alert and oriented to person, place, and time.  Psychiatric:        Mood and Affect: Mood normal.      UC Treatments / Results  Labs (all labs ordered are listed, but only abnormal results are displayed) Labs Reviewed  POC COVID19/FLU A&B COMBO - Normal    EKG   Radiology No results found.  Procedures Procedures (including critical care time)  Medications Ordered in UC Medications  methylPREDNISolone  acetate (DEPO-MEDROL ) injection 80 mg (80 mg Intramuscular Given 10/24/24 1211)    Initial Impression / Assessment and Plan / UC Course  I have reviewed the triage vital signs and the nursing notes.  Pertinent labs & imaging results that were available during my care of the patient were reviewed by me and considered in my medical decision making (see chart for details).  Plan of Care: COPD exacerbation with cough and shortness of breath: Rapid flu and COVID are negative.  Chest x-ray is slightly hazy consistent with COPD emphysema but no consolidation is seen (no specific  sign of pneumonia).  Will update the patient once the radiologist has read the x-ray.  Depo-Medrol  80 mg injection now (this is a steroid).  Get plenty of fluids and rest.  Continue inhalers.  Continue home oxygen.  Promethazine DM, 5 mL, every 6 hours if needed for cough.  Follow-up with primary care or return here if symptoms do not improve, worsen or new symptoms occur.  I reviewed the plan of care with the patient and/or the patient's guardian.  The patient and/or guardian had time to ask questions and acknowledged that the questions were answered.  I provided instruction on symptoms or reasons to return here or to  go to an ER, if symptoms/condition did not improve, worsened or if new symptoms occurred.  Final Clinical Impressions(s) / UC Diagnoses   Final diagnoses:  Acute cough  Shortness of breath  COPD exacerbation (HCC)     Discharge Instructions      COPD exacerbation with cough and shortness of breath: Rapid flu and COVID are negative.  Chest x-ray is slightly hazy consistent with COPD emphysema but no consolidation is seen (no specific sign of pneumonia).  Will update the patient once the radiologist has read the x-ray.  Depo-Medrol  80 mg injection now (this is a steroid).  Get plenty of fluids and rest.  Continue inhalers.  Continue home oxygen.  Promethazine DM, 5 mL, every 6 hours if needed for cough.  Follow-up with primary care or return here if symptoms do not improve, worsen or new symptoms occur.     ED Prescriptions     Medication Sig Dispense Auth. Provider   promethazine-dextromethorphan (PROMETHAZINE-DM) 6.25-15 MG/5ML syrup Take 5 mLs by mouth 4 (four) times daily as needed for cough. Do not use and drive - May make drowsy. 118 mL Ival Domino, FNP      PDMP not reviewed this encounter.   Ival Domino, FNP 10/24/24 1226

## 2024-10-24 NOTE — Progress Notes (Signed)
 I attempted to reach but the patient and her daughter via telephone with results of the chest x-ray after radiology had reviewed the film.  I had told them that the x-ray showed changes of COPD but was otherwise negative and no pneumonia.  The chest x-ray was read by radiology as changes suggestive of emphysema but no pneumonia.  Called [phone], the number for [name] (the patient's daughter) and also [phone], the number listed as the home number for the patient but also listed as the phone number for Chrystal Pries-Davis (another daughter).  I tried to leave voicemail messages on both phone numbers but I am not sure that I was able to leave a voicemail message.  The patient does have the portal and should get this message.  The chest x-ray showed changes of emphysema but no pneumonia and was otherwise normal.  Antibiotics not indicated at this time.  Please follow-up if symptoms are not improving after the Depo-Medrol  shot and with use of the inhalers.

## 2024-10-25 ENCOUNTER — Other Ambulatory Visit: Payer: Self-pay | Admitting: Licensed Clinical Social Worker

## 2024-10-25 NOTE — Patient Instructions (Signed)
 Visit Information  Thank you for taking time to visit with me today. Please don't hesitate to contact me if I can be of assistance to you in the future  Closing From: Complex Care Management.  Please call the care guide team at [phone] if you need to cancel, schedule, or reschedule an appointment.   Please call the Suicide and Crisis Lifeline: 988 call the [place]  National Suicide Prevention Lifeline: 319-792-2405 or TTY: 810-790-5124 TTY 838-136-7970) to talk to a trained counselor call 1-800-273-TALK (toll free, 24 hour hotline) go to [facility] [address] [place] [phone]) call 911 if you are experiencing a Mental Health or Behavioral Health Crisis or need someone to talk to.  Lyle Rung, BSW, MSW, LCSW Licensed Clinical Social Worker [employer]   [facility] [email or web address] Direct Dial: [phone]

## 2024-10-25 NOTE — Patient Outreach (Signed)
 Complex Care Management   Visit Note  10/25/2024  Name:  Lindsey Rowland MRN: [id number] DOB: [birth year]  Situation: Referral received for Complex Care Management related to Mental/Behavioral Health diagnosis dementia/stress management needs. I obtained verbal consent from POA.  Visit completed with POA  on the phone  Background:   Past Medical History:  Diagnosis Date   Actinic keratoses 01/14/2021   Allergic rhinitis 12/10/2008   Arthropathy of ankle and foot 06/10/2009   Chronic obstructive pulmonary disease 07/25/2007   Coronary artery calcification 01/22/2018   Diverticulitis of colon (without mention of hemorrhage) 07/15/2008   Elevated alcohol use    Epiretinal membrane 05/08/2011   Gastroesophageal reflux disease 11/08/2021   Generalized anxiety disorder 10/16/2007   Headache    History of poliomyelitis    History of radiation therapy 09/10/2018-09/17/2018   SBRT 3 fx to right lung; Dr. Lynwood Nasuti   Hypothyroidism 06/09/2023   Intercostal pain 05/04/2021   Irritable bowel syndrome 03/09/2010   Lentigo 01/14/2021   Malignant neoplasm of unspecified part of unspecified bronchus or lung 06/09/2023   Mass of chest wall, right 05/04/2021   Mild cognitive impairment with concerns for Alzheimer's disease 10/13/2023   Mild protein-calorie malnutrition 06/09/2023   Non-small cell cancer of right lung 04/16/2018   Nuclear cataract 02/23/2012   Osteoarthritis 12/10/2008   Osteopenia    Restless legs syndrome 01/20/2017   Retinal detachment of right eye with retinal break 02/23/2012   Seborrheic keratoses 01/14/2021   Spasm of muscle 06/29/2010   Status post intraocular lens implant 02/23/2012   Tobacco use 06/29/2010   Ulcer    stomach hx    Assessment: Patient Reported Symptoms:  Cognitive Cognitive Status: Struggling with memory recall, Poor judgment in daily scenarios Cognitive/Intellectual Conditions Management [RPT]: Behavior Disorders Behavior Disorders:  Dementia   Health Maintenance Behaviors: Annual physical exam Healing Pattern: Average Health Facilitated by: Rest, Stress management  Neurological Neurological Review of Symptoms: Weakness Neurological Management Strategies: Routine screening Neurological Self-Management Outcome: 3 (uncertain)  HEENT HEENT Symptoms Reported: No symptoms reported HEENT Management Strategies: Routine screening    Cardiovascular Cardiovascular Symptoms Reported: No symptoms reported    Respiratory Respiratory Symptoms Reported: Shortness of breath Additional Respiratory Details: COPD ED visit yesterday but family report pt is stable and did not need to be hospitalized Respiratory Management Strategies: Oxygen  therapy Respiratory Self-Management Outcome: 3 (uncertain)  Endocrine Endocrine Symptoms Reported: No symptoms reported    Gastrointestinal Gastrointestinal Symptoms Reported: No symptoms reported      Genitourinary Genitourinary Symptoms Reported: Not assessed    Integumentary Integumentary Symptoms Reported: Not assessed    Musculoskeletal Musculoskelatal Symptoms Reviewed: Not assessed        Psychosocial Psychosocial Symptoms Reported: Irritability Behavioral Management Strategies: Medication therapy, Coping strategies Behavioral Health Self-Management Outcome: 4 (good) Major Change/Loss/Stressor/Fears (CP): Medical condition, self Techniques to Cope with Loss/Stress/Change: Medication Quality of Family Relationships: involved, helpful Do you feel physically threatened by others?: No    10/25/2024    PHQ2-9 Depression Screening   Little interest or pleasure in doing things Not at all  Feeling down, depressed, or hopeless Not at all  PHQ-2 - Total Score 0  Trouble falling or staying asleep, or sleeping too much    Feeling tired or having little energy    Poor appetite or overeating     Feeling bad about yourself - or that you are a failure or have let yourself or your family down     Trouble concentrating on things, such  as reading the newspaper or watching television    Moving or speaking so slowly that other people could have noticed.  Or the opposite - being so fidgety or restless that you have been moving around a lot more than usual    Thoughts that you would be better off dead, or hurting yourself in some way    PHQ2-9 Total Score    If you checked off any problems, how difficult have these problems made it for you to do your work, take care of things at home, or get along with other people    Depression Interventions/Treatment      There were no vitals filed for this visit. Pain Scale: 0-10 Pain Score: 0-No pain  Medications Reviewed Today     Reviewed by Merlynn Lyle CROME, LCSW (Social Worker) on 10/25/24 at [phone]  Med List Status: <None>   Medication Order Taking? Sig Documenting Provider Last Dose Status Informant  albuterol  (VENTOLIN  HFA) 108 (90 Base) MCG/ACT inhaler [id number]  1 PUFF EVERY 8 HOURS AS NEEDED Mannam, Praveen, MD  Active   Aspirin-Salicylamide-Caffeine (BC HEADACHE POWDER PO) [id number]  Take 1 packet by mouth daily as needed (for pain or headache).  [provider]  Active Self  celecoxib  (CELEBREX ) 200 MG capsule [id number]  TAKE 1 CAPSULE BY MOUTH EVERY DAY AS NEEDED Berneta Elsie Sayre, MD  Active   diphenoxylate -atropine  (LOMOTIL ) 2.5-0.025 MG tablet [id number]  TAKE 1 TABLET BY MOUTH 4 (FOUR) TIMES DAILY AS NEEDED FOR DIARRHEA OR LOOSE STOOLS. Berneta Elsie Sayre, MD  Active   divalproex  (DEPAKOTE ) 125 MG DR tablet [id number]  Take 1 tablet (125 mg total) by mouth at bedtime. Wertman, Sara E, PA-C  Active   donepezil  (ARICEPT ) 10 MG tablet [id number]  Take 1 tablet (10 mg total) by mouth daily. Wertman, Sara E, PA-C  Active   Fluticasone-Umeclidin-Vilant (TRELEGY ELLIPTA ) 200-62.5-25 MCG/ACT AEPB [id number]  Inhale 1 puff into the lungs daily. Mannam, Praveen, MD  Active   ipratropium-albuterol  (DUONEB) 0.5-2.5 (3) MG/3ML SOLN  [id number]  Take 3 mLs by nebulization every 6 (six) hours as needed. Diagnosis code:  J44.1, J18.9 Mannam, Praveen, MD  Active   mirtazapine  (REMERON ) 15 MG tablet [id number]  Take 1 tablet (15 mg total) by mouth at bedtime. Kapoor, Sahil, MD  Active   mometasone  (NASONEX ) 50 MCG/ACT nasal spray [id number]  PLACE 2 SPRAYS INTO THE NOSE DAILY.  Patient taking differently: Place 2 sprays into the nose daily. prn   Berneta Elsie Sayre, MD  Active   Multiple Vitamin [facility] WITH MINERALS) TABS [id number]  Take 1 tablet by mouth daily. [provider]  Active Self  nicotine  (NICODERM CQ  - DOSED IN MG/24 HOURS) 14 mg/24hr patch [id number]  Place 1 patch (14 mg total) onto the skin daily. Mannam, Praveen, MD  Active   OXYGEN  [id number]  Inhale 2 L/min into the lungs continuous as needed (shortness of breath). [provider]  Active   Polyethyl Glycol-Propyl Glycol (SYSTANE OP) [id number]  Place 1 drop into both eyes daily as needed (for dry eyes). [provider]  Active Self  promethazine -dextromethorphan (PROMETHAZINE -DM) 6.25-15 MG/5ML syrup [id number]  Take 5 mLs by mouth 4 (four) times daily as needed for cough. Do not use and drive - May make drowsy. Ival Domino, FNP  Active   Varenicline  Tartrate, Starter, (CHANTIX  STARTING MONTH PAK) 0.5 MG X 11 & 1 MG X 42 TBPK [id number]  Take 1 tablet by mouth daily. Mannam, Praveen, MD  Active  Recommendation:   PCP Follow-up Continue Current Plan of Care Follow up regarding Lyrica  medication (POA requesting medication refill and providers have been updated by [facility] LCSW)  Follow Up Plan:   Closing From:  Complex Care Management  Lyle Rung, BSW, MSW, LCSW Licensed Clinical Social Worker [place]   [facility] [email or web address] Direct Dial: [phone]

## 2024-10-29 ENCOUNTER — Ambulatory Visit (INDEPENDENT_AMBULATORY_CARE_PROVIDER_SITE_OTHER): Admitting: Internal Medicine

## 2024-10-29 VITALS — BP 119/56 | HR 69 | Temp 97.7°F | Ht 66.0 in | Wt 84.4 lb

## 2024-10-29 DIAGNOSIS — J441 Chronic obstructive pulmonary disease with (acute) exacerbation: Secondary | ICD-10-CM | POA: Diagnosis not present

## 2024-10-29 DIAGNOSIS — N390 Urinary tract infection, site not specified: Secondary | ICD-10-CM | POA: Diagnosis not present

## 2024-10-29 LAB — POC URINALSYSI DIPSTICK (AUTOMATED)
Bilirubin, UA: 2
Blood, UA: 3
Glucose, UA: NEGATIVE
Ketones, UA: POSITIVE
Nitrite, UA: NEGATIVE
Protein, UA: POSITIVE — AB
Spec Grav, UA: 1.015 (ref 1.010–1.025)
Urobilinogen, UA: 0.2 U/dL
pH, UA: 6 (ref 5.0–8.0)

## 2024-10-29 MED ORDER — SULFAMETHOXAZOLE-TRIMETHOPRIM 800-160 MG PO TABS: 1.0000 | ORAL_TABLET | Freq: Two times a day (BID) | ORAL | 0 refills | Status: AC

## 2024-10-29 MED ORDER — PROMETHAZINE-DM 6.25-15 MG/5ML PO SYRP: 5.0000 mL | ORAL_SOLUTION | Freq: Four times a day (QID) | ORAL | 0 refills | Status: AC | PRN

## 2024-10-29 NOTE — Progress Notes (Signed)
 [facility] [address] [place] [address] Dept: [phone] Dept Fax: [phone]  Acute Care Office Visit  Subjective:   Lindsey Rowland 12/19/1950 10/29/2024  Chief Complaint  Patient presents with   Cough    Pt presents today with a productive cough. Pneumonia 3x this year, COPD, Emphysema, still smokes. Cough medicine hasn't help.     HPI:  Discussed the use of AI scribe software for clinical note transcription with the patient, who gave verbal consent to proceed.  History of Present Illness   Lindsey Rowland is a 73 year old female with COPD who presents with a cough and symptoms of a urinary tract infection.  She has been experiencing a cough and received a steroid injection at urgent care five days ago. COVID and flu tests were negative. She continues to use her inhalers and was prescribed promethazine  DM cough syrup. The cough is now less productive, mainly occurring in the mornings. She experiences intermittent shortness of breath and uses oxygen  as needed, more frequently over the past weekend. No fever or chest pain is present.  She also believes she is developing a urinary tract infection, with burning during urination since yesterday. No blood in her urine, belly pain, bladder pain, back or flank pain, foul-smelling urine, discharge, or vaginal itching. She reports urgency to urinate.       The following portions of the patient's history were reviewed and updated as appropriate: past medical history, past surgical history, family history, social history, allergies, medications, and problem list.   Patient Active Problem List   Diagnosis Date Noted   Lack of appetite 06/17/2024   Mild cognitive impairment with concerns for Alzheimer's disease 10/13/2023   Mild protein-calorie malnutrition 06/09/2023   Malignant neoplasm of unspecified part of unspecified bronchus or lung 06/09/2023   Hypothyroidism  06/09/2023   Elevated alcohol use    Gastroesophageal reflux disease 11/08/2021   Mass of chest wall, right 05/04/2021   Seborrheic keratoses 01/14/2021   Lentigo 01/14/2021   Actinic keratoses 01/14/2021   Non-small cell cancer of right lung 04/16/2018   Coronary artery calcification 01/22/2018   Restless legs syndrome 01/20/2017   Status post intraocular lens implant 02/23/2012   Nuclear cataract 02/23/2012   Epiretinal membrane 05/08/2011   Tobacco use 06/29/2010   History of poliomyelitis    Irritable bowel syndrome 03/09/2010   Arthropathy of ankle and foot 06/10/2009   Allergic rhinitis 12/10/2008   Osteoarthritis 12/10/2008   Generalized anxiety disorder 10/16/2007   Chronic obstructive pulmonary disease 07/25/2007   Past Medical History:  Diagnosis Date   Actinic keratoses 01/14/2021   Allergic rhinitis 12/10/2008   Arthropathy of ankle and foot 06/10/2009   Chronic obstructive pulmonary disease 07/25/2007   Coronary artery calcification 01/22/2018   Diverticulitis of colon (without mention of hemorrhage) 07/15/2008   Elevated alcohol use    Epiretinal membrane 05/08/2011   Gastroesophageal reflux disease 11/08/2021   Generalized anxiety disorder 10/16/2007   Headache    History of poliomyelitis    History of radiation therapy 09/10/2018-09/17/2018   SBRT 3 fx to right lung; Dr. Lynwood Nasuti   Hypothyroidism 06/09/2023   Intercostal pain 05/04/2021   Irritable bowel syndrome 03/09/2010   Lentigo 01/14/2021   Malignant neoplasm of unspecified part of unspecified bronchus or lung 06/09/2023   Mass of chest wall, right 05/04/2021   Mild cognitive impairment with concerns for Alzheimer's disease 10/13/2023   Mild protein-calorie malnutrition 06/09/2023   Non-small  cell cancer of right lung 04/16/2018   Nuclear cataract 02/23/2012   Osteoarthritis 12/10/2008   Osteopenia    Restless legs syndrome 01/20/2017   Retinal detachment of right eye with retinal break  02/23/2012   Seborrheic keratoses 01/14/2021   Spasm of muscle 06/29/2010   Status post intraocular lens implant 02/23/2012   Tobacco use 06/29/2010   Ulcer    stomach hx   Past Surgical History:  Procedure Laterality Date   APPENDECTOMY     BREAST BIOPSY Right 2013   CATARACT EXTRACTION     right   DILATION AND CURETTAGE OF UTERUS     FOOT SURGERY Bilateral    hip si joint     injection/ablation   RETINAL DETACHMENT SURGERY  12/2010   right eye   TONSILLECTOMY     VIDEO BRONCHOSCOPY WITH ENDOBRONCHIAL NAVIGATION Right 08/01/2018   Procedure: VIDEO BRONCHOSCOPY WITH ENDOBRONCHIAL NAVIGATION Fiducial Placement;  Surgeon: Shelah Lamar RAMAN, MD;  Location: [facility] OR;  Service: Thoracic;  Laterality: Right;   Family History  Problem Relation Age of Onset   Dementia Mother    Hiatal hernia Father    Mental illness Maternal Aunt    Suicidality Maternal Aunt    Colon cancer Neg Hx    Esophageal cancer Neg Hx    Stomach cancer Neg Hx    Rectal cancer Neg Hx     Current Outpatient Medications:    albuterol  (VENTOLIN  HFA) 108 (90 Base) MCG/ACT inhaler, 1 PUFF EVERY 8 HOURS AS NEEDED, Disp: 18 each, Rfl: 10   Aspirin-Salicylamide-Caffeine (BC HEADACHE POWDER PO), Take 1 packet by mouth daily as needed (for pain or headache). , Disp: , Rfl:    celecoxib  (CELEBREX ) 200 MG capsule, TAKE 1 CAPSULE BY MOUTH EVERY DAY AS NEEDED, Disp: 90 capsule, Rfl: 1   diphenoxylate -atropine  (LOMOTIL ) 2.5-0.025 MG tablet, TAKE 1 TABLET BY MOUTH 4 (FOUR) TIMES DAILY AS NEEDED FOR DIARRHEA OR LOOSE STOOLS., Disp: 120 tablet, Rfl: 2   divalproex  (DEPAKOTE ) 125 MG DR tablet, Take 1 tablet (125 mg total) by mouth at bedtime., Disp: 60 tablet, Rfl: 11   donepezil  (ARICEPT ) 10 MG tablet, Take 1 tablet (10 mg total) by mouth daily., Disp: 90 tablet, Rfl: 3   Fluticasone-Umeclidin-Vilant (TRELEGY ELLIPTA ) 200-62.5-25 MCG/ACT AEPB, Inhale 1 puff into the lungs daily., Disp: 3 each, Rfl: 3   ipratropium-albuterol  (DUONEB)  0.5-2.5 (3) MG/3ML SOLN, Take 3 mLs by nebulization every 6 (six) hours as needed. Diagnosis code:  J44.1, J18.9, Disp: 360 mL, Rfl: 3   mirtazapine  (REMERON ) 15 MG tablet, Take 1 tablet (15 mg total) by mouth at bedtime., Disp: 90 tablet, Rfl: 0   mometasone  (NASONEX ) 50 MCG/ACT nasal spray, PLACE 2 SPRAYS INTO THE NOSE DAILY. (Patient taking differently: Place 2 sprays into the nose daily. prn), Disp: 51 each, Rfl: 4   Multiple Vitamin (MULITIVITAMIN WITH MINERALS) TABS, Take 1 tablet by mouth daily., Disp: , Rfl:    nicotine  (NICODERM CQ  - DOSED IN MG/24 HOURS) 14 mg/24hr patch, Place 1 patch (14 mg total) onto the skin daily., Disp: 28 patch, Rfl: 0   OXYGEN , Inhale 2 L/min into the lungs continuous as needed (shortness of breath)., Disp: , Rfl:    Polyethyl Glycol-Propyl Glycol (SYSTANE OP), Place 1 drop into both eyes daily as needed (for dry eyes)., Disp: , Rfl:    sulfamethoxazole -trimethoprim  (BACTRIM  DS) 800-160 MG tablet, Take 1 tablet by mouth 2 (two) times daily for 7 days., Disp: 14 tablet, Rfl: 0  Varenicline  Tartrate, Starter, (CHANTIX  STARTING MONTH PAK) 0.5 MG X 11 & 1 MG X 42 TBPK, Take 1 tablet by mouth daily., Disp: 1 each, Rfl: 2   promethazine -dextromethorphan (PROMETHAZINE -DM) 6.25-15 MG/5ML syrup, Take 5 mLs by mouth 4 (four) times daily as needed for cough. Do not use and drive - May make drowsy., Disp: 118 mL, Rfl: 0 Allergies  Allergen Reactions   Amoxicillin  Diarrhea and Itching     ROS: A complete ROS was performed with pertinent positives/negatives noted in the HPI. The remainder of the ROS are negative.    Objective:   Today's Vitals   10/29/24 1038  BP: (!) 119/56  Pulse: 69  Temp: 97.7 F (36.5 C)  SpO2: 97%  Weight: 84 lb 6.4 oz (38.3 kg)  Height: 5' 6 (1.676 m)    GENERAL: Well-appearing, in NAD. Well nourished.  SKIN: Pink, warm and dry. No rash.  NECK: Trachea midline. Full ROM w/o pain or tenderness. No lymphadenopathy.  RESPIRATORY: Chest  wall symmetrical. Respirations even and non-labored. Breath sounds clear to auscultation bilaterally.  CARDIAC: S1, S2 present, regular rate and rhythm. Peripheral pulses 2+ bilaterally.  GI: Abdomen soft, non-tender. Normoactive bowel sounds. No rebound tenderness. No hepatomegaly or splenomegaly. No CVA tenderness.  EXTREMITIES: Without clubbing, cyanosis, or edema.  NEUROLOGIC: Steady, even gait.  PSYCH/MENTAL STATUS: Alert, oriented x 3. Cooperative, appropriate mood and affect.    Results for orders placed or performed in visit on 10/29/24  POCT Urinalysis Dipstick (Automated)  Result Value Ref Range   Color, UA yellow    Clarity, UA clear    Glucose, UA Negative Negative   Bilirubin, UA 2    Ketones, UA positive    Spec Grav, UA 1.015 1.010 - 1.025   Blood, UA 3    pH, UA 6.0 5.0 - 8.0   Protein, UA Positive (A) Negative   Urobilinogen, UA 0.2 0.2 or 1.0 E.U./dL   Nitrite, UA neg    Leukocytes, UA Large (3+) (A) Negative      Assessment & Plan:  Assessment and Plan    Urinary tract infection Early UTI indicated by urine sample. Possible bacterial contamination due to decreased mobility or wiping technique. - Prescribed Bactrim , one tablet twice daily for seven days. - Sent urine for culture to identify bacteria and guide antibiotic therapy. - Advised taking Bactrim  after meals if gastrointestinal upset occurs.  Chronic obstructive pulmonary disease with acute exacerbation COPD exacerbation improved with steroid injection and inhalers.  - Continue current inhalers. - Use oxygen  as needed. - prescribe phenergan  DM  - Can use OTC mucinex       Meds ordered this encounter  Medications   sulfamethoxazole -trimethoprim  (BACTRIM  DS) 800-160 MG tablet    Sig: Take 1 tablet by mouth 2 (two) times daily for 7 days.    Dispense:  14 tablet    Refill:  0    Supervising Provider:   THOMPSON, AARON B [[id number]]   promethazine -dextromethorphan (PROMETHAZINE -DM) 6.25-15 MG/5ML  syrup    Sig: Take 5 mLs by mouth 4 (four) times daily as needed for cough. Do not use and drive - May make drowsy.    Dispense:  118 mL    Refill:  0    Supervising Provider:   SEBASTIAN BEVERLEY NOVAK [[id number]]   Orders Placed This Encounter  Procedures   Urine Culture   POCT Urinalysis Dipstick (Automated)   Lab Orders         Urine Culture  POCT Urinalysis Dipstick (Automated)     No images are attached to the encounter or orders placed in the encounter.  No follow-ups on file.   Rosina Senters, FNP

## 2024-10-30 ENCOUNTER — Telehealth: Payer: Self-pay

## 2024-10-30 ENCOUNTER — Other Ambulatory Visit: Payer: Self-pay | Admitting: Family Medicine

## 2024-10-30 ENCOUNTER — Ambulatory Visit: Payer: Self-pay | Admitting: Family Medicine

## 2024-10-30 DIAGNOSIS — Z8612 Personal history of poliomyelitis: Secondary | ICD-10-CM

## 2024-10-30 DIAGNOSIS — M129 Arthropathy, unspecified: Secondary | ICD-10-CM

## 2024-10-30 DIAGNOSIS — M1990 Unspecified osteoarthritis, unspecified site: Secondary | ICD-10-CM

## 2024-10-30 NOTE — Telephone Encounter (Signed)
 Copied from CRM #[id number]. Topic: Clinical - Medication Question >> Oct 30, 2024  3:37 PM Suzen RAMAN wrote: Reason for CRM: patient daughter would like a call back pertaining to why her Pregablin was disconnected.     CB#[phone]

## 2024-10-30 NOTE — Telephone Encounter (Signed)
 Copied from CRM #[id number]. Topic: Clinical - Medication Refill >> Oct 30, 2024  3:34 PM Suzen RAMAN wrote: Medication: celecoxib  (CELEBREX ) 200 MG capsule   Has the patient contacted their pharmacy? Yes   This is the patient's preferred pharmacy:  [id number] [place] [place] - [address] [place] [address] Phone: [phone] Fax: [phone]  Is this the correct pharmacy for this prescription? Yes If no, delete pharmacy and type the correct one.   Has the prescription been filled recently? No  Is the patient out of the medication? No  Has the patient been seen for an appointment in the last year OR does the patient have an upcoming appointment? Yes  Can we respond through MyChart? Yes  Agent: Please be advised that Rx refills may take up to 3 business days. We ask that you follow-up with your pharmacy.

## 2024-10-30 NOTE — Telephone Encounter (Signed)
 Called [facility] and transferred daughter to [facility] warm transfer for further assistance with these refill requests.   Daughter calling for patient--daughter is power of attorney & patient has dementia Patient has been out of Celebrex  due to chronic pain with osteoarthritis She has been out of it for three days now Patient also hasn't had a refill of Pregabalin  for the past  --was taking Pregabalin  for restless leg syndrome   FYI Only or Action Required?: Action required by provider: clinical question for provider, update on patient condition, and 2 medication refill requests--Pregabalin  and Celebrex .  Patient was last seen in primary care on 10/29/2024 by Billy Knee, FNP.  Called Nurse Triage reporting Medication Problem.  Symptoms are: unchanged.  Triage Disposition: Call PCP Now  Patient/caregiver understands and will follow disposition?: Yes                   Copied from CRM #[id number]. Topic: Clinical - Red Word Triage >> Oct 30, 2024  3:38 PM Suzen RAMAN wrote: Red Word that prompted transfer to Nurse Triage: patient  currently in pain all over due to not having medication. (celecoxib  (CELEBREX ) 200 MG capsule). Refill request submitted. Patient daughter on the line [place] Reason for Disposition  [1] Prescription not at pharmacy AND [2] was prescribed by doctor (or NP/PA) recently  (Exception: [facility] has access to EMR and prescription is recorded there. Go to Home Care and confirm for pharmacy.)  Answer Assessment - Initial Assessment Questions Patient was seen in office yesterday (10/29/2024) and daughter states that her sister was with the patient at the appointment and asked for any refills that needed to be sent in to be sent in for the patient, Celebrex  being one of them but it was not sent in to the pharmacy when the daughter went to go pick it up  Daughter calling for patient--daughter is power of attorney & patient has dementia Patient has been out of Celebrex   due to chronic pain with osteoarthritis She has been out of it for three days now Patient also hasn't had a refill of Pregabalin  either  --was taking Pregabalin  for restless leg syndrome  This RN called [facility] to inquire about these two medications needing to be refilled Called [facility] and transferred daughter to [facility] warm transfer for further assistance with these refill requests.  Protocols used: Medication Question Call-A-AH

## 2024-11-02 LAB — URINE CULTURE
MICRO NUMBER:: 17281541
SPECIMEN QUALITY:: ADEQUATE

## 2024-11-03 ENCOUNTER — Other Ambulatory Visit: Payer: Self-pay | Admitting: Family Medicine

## 2024-11-03 DIAGNOSIS — Z8612 Personal history of poliomyelitis: Secondary | ICD-10-CM

## 2024-11-03 DIAGNOSIS — M129 Arthropathy, unspecified: Secondary | ICD-10-CM

## 2024-11-03 DIAGNOSIS — M1990 Unspecified osteoarthritis, unspecified site: Secondary | ICD-10-CM

## 2024-11-05 MED ORDER — CELECOXIB 200 MG PO CAPS: ORAL_CAPSULE | ORAL | 1 refills | Status: AC

## 2024-11-06 ENCOUNTER — Ambulatory Visit: Payer: Self-pay | Admitting: Family

## 2024-11-13 ENCOUNTER — Telehealth: Payer: Self-pay | Admitting: Physician Assistant

## 2024-11-13 ENCOUNTER — Ambulatory Visit: Admitting: Physician Assistant

## 2024-11-13 ENCOUNTER — Encounter: Payer: Self-pay | Admitting: Physician Assistant

## 2024-11-13 VITALS — BP 144/85 | HR 87 | Resp 20 | Wt 84.0 lb

## 2024-11-13 DIAGNOSIS — F325 Major depressive disorder, single episode, in full remission: Secondary | ICD-10-CM | POA: Diagnosis not present

## 2024-11-13 DIAGNOSIS — R413 Other amnesia: Secondary | ICD-10-CM

## 2024-11-13 DIAGNOSIS — G3184 Mild cognitive impairment, so stated: Secondary | ICD-10-CM | POA: Diagnosis not present

## 2024-11-13 NOTE — Telephone Encounter (Signed)
 [facility] Call ID: [id number]  Caller: Abigail  [facility]: [phone]  Reason: Caller states her niece wasn't allowed to call her and she needs to let the office know a few things.

## 2024-11-13 NOTE — Patient Instructions (Signed)
 It was a pleasure to see you today at our office.   Recommendations:   Continue   Depakote   125 mg Take 1 tab at night , may increase to 1 tab twice a day if needed  Continue Donepezil  increase to 10 mg daily  Referral to psychiatry for Major depression and anxiety Follow up July 1  at 11:30  Repeat neurocognitive testing   Quit alcohol and recommend discontinuing tobacco  Recommend no further driving    For psychiatric meds, mood meds: Please have your primary care physician manage these medications.  If you have any severe symptoms of a stroke, or other severe issues such as confusion,severe chills or fever, etc call 911 or go to the ER as you may need to be evaluated further   For guidance regarding [facility] and if placement were needed at the facility, contact Social Worker tel: [phone]  For assessment of decision of mental capacity and competency:  Call Dr. Rosaline Nine, geriatric psychiatrist at [phone]  Counseling regarding caregiver distress, including caregiver depression, anxiety and issues regarding community resources, adult day care programs, adult living facilities, or memory care questions:  please contact your  Primary Doctor's Social Worker   Whom to call: Memory  decline, memory medications: Call our office [phone]    [email or web address]   RECOMMENDATIONS FOR ALL PATIENTS WITH MEMORY PROBLEMS: 1. Continue to exercise (Recommend 30 minutes of walking everyday, or 3 hours every week) 2. Increase social interactions - continue going to [place] and enjoy social gatherings with friends and family 3. Eat healthy, avoid fried foods and eat more fruits and vegetables 4. Maintain adequate blood pressure, blood sugar, and blood cholesterol level. Reducing the risk of stroke and cardiovascular disease also helps promoting better memory. 5. Avoid stressful situations. Live a simple  life and avoid aggravations. Organize your time and prepare for the next day in anticipation. 6. Sleep well, avoid any interruptions of sleep and avoid any distractions in the bedroom that may interfere with adequate sleep quality 7. Avoid sugar, avoid sweets as there is a strong link between excessive sugar intake, diabetes, and cognitive impairment We discussed the Mediterranean diet, which has been shown to help patients reduce the risk of progressive memory disorders and reduces cardiovascular risk. This includes eating fish, eat fruits and green leafy vegetables, nuts like almonds and hazelnuts, walnuts, and also use olive oil. Avoid fast foods and fried foods as much as possible. Avoid sweets and sugar as sugar use has been linked to worsening of memory function.  There is always a concern of gradual progression of memory problems. If this is the case, then we may need to adjust level of care according to patient needs. Support, both to the patient and caregiver, should then be put into place.      DRIVING: Regarding driving, in patients with progressive memory problems, driving will be impaired. We advise to have someone else do the driving if trouble finding directions or if minor accidents are reported. Independent driving assessment is available to determine safety of driving.   If you are interested in the driving assessment, you can contact the following:  [facility] in [place] [phone]  [facility] [phone]  [facility] [phone]  [facility] [phone] or [phone]   FALL PRECAUTIONS: Be cautious when walking. Scan the area for obstacles that may increase the risk of trips and falls. When getting up in the mornings, sit up at the edge of  the bed for a few minutes before getting out of bed. Consider elevating the bed at the head end to avoid drop of blood pressure when getting up. Walk always in a well-lit room  (use night lights in the walls). Avoid area rugs or power cords from appliances in the middle of the walkways. Use a walker or a cane if necessary and consider physical therapy for balance exercise. Get your eyesight checked regularly.  FINANCIAL OVERSIGHT: Supervision, especially oversight when making financial decisions or transactions is also recommended.  HOME SAFETY: Consider the safety of the kitchen when operating appliances like stoves, microwave oven, and blender. Consider having supervision and share cooking responsibilities until no longer able to participate in those. Accidents with firearms and other hazards in the house should be identified and addressed as well.   ABILITY TO BE LEFT ALONE: If patient is unable to contact 911 operator, consider using [facility], or when the need is there, arrange for someone to stay with patients. Smoking is a fire hazard, consider supervision or cessation. Risk of wandering should be assessed by caregiver and if detected at any point, supervision and safe proof recommendations should be instituted.  MEDICATION SUPERVISION: Inability to self-administer medication needs to be constantly addressed. Implement a mechanism to ensure safe administration of the medications.      Mediterranean Diet A Mediterranean diet refers to food and lifestyle choices that are based on the traditions of countries located on the [employer]. This way of eating has been shown to help prevent certain conditions and improve outcomes for people who have chronic diseases, like kidney disease and heart disease. What are tips for following this plan? Lifestyle  Cook and eat meals together with your family, when possible. Drink enough fluid to keep your urine clear or pale yellow. Be physically active every day. This includes: Aerobic exercise like running or swimming. Leisure activities like gardening, walking, or housework. Get 7-8 hours of sleep each night. If  recommended by your health care provider, drink red wine in moderation. This means 1 glass a day for nonpregnant women and 2 glasses a day for men. A glass of wine equals 5 oz (150 mL). Reading food labels  Check the serving size of packaged foods. For foods such as rice and pasta, the serving size refers to the amount of cooked product, not dry. Check the total fat in packaged foods. Avoid foods that have saturated fat or trans fats. Check the ingredients list for added sugars, such as corn syrup. Shopping  At the grocery store, buy most of your food from the areas near the walls of the store. This includes: Fresh fruits and vegetables (produce). Grains, beans, nuts, and seeds. Some of these may be available in unpackaged forms or large amounts (in bulk). Fresh seafood. Poultry and eggs. Low-fat dairy products. Buy whole ingredients instead of prepackaged foods. Buy fresh fruits and vegetables in-season from local farmers markets. Buy frozen fruits and vegetables in resealable bags. If you do not have access to quality fresh seafood, buy precooked frozen shrimp or canned fish, such as tuna, salmon, or sardines. Buy small amounts of raw or cooked vegetables, salads, or olives from the deli or salad bar at your store. Stock your pantry so you always have certain foods on hand, such as olive oil, canned tuna, canned tomatoes, rice, pasta, and beans. Cooking  Cook foods with extra-virgin olive oil instead of using butter or other vegetable oils. Have meat as a side dish, and have  vegetables or grains as your main dish. This means having meat in small portions or adding small amounts of meat to foods like pasta or stew. Use beans or vegetables instead of meat in common dishes like chili or lasagna. Experiment with different cooking methods. Try roasting or broiling vegetables instead of steaming or sauteing them. Add frozen vegetables to soups, stews, pasta, or rice. Add nuts or seeds for added  healthy fat at each meal. You can add these to yogurt, salads, or vegetable dishes. Marinate fish or vegetables using olive oil, lemon juice, garlic, and fresh herbs. Meal planning  Plan to eat 1 vegetarian meal one day each week. Try to work up to 2 vegetarian meals, if possible. Eat seafood 2 or more times a week. Have healthy snacks readily available, such as: Vegetable sticks with hummus. Greek yogurt. Fruit and nut trail mix. Eat balanced meals throughout the week. This includes: Fruit: 2-3 servings a day Vegetables: 4-5 servings a day Low-fat dairy: 2 servings a day Fish, poultry, or lean meat: 1 serving a day Beans and legumes: 2 or more servings a week Nuts and seeds: 1-2 servings a day Whole grains: 6-8 servings a day Extra-virgin olive oil: 3-4 servings a day Limit red meat and sweets to only a few servings a month What are my food choices? Mediterranean diet Recommended Grains: Whole-grain pasta. Brown rice. Bulgar wheat. Polenta. Couscous. Whole-wheat bread. [place]. Vegetables: Artichokes. Beets. Broccoli. Cabbage. Carrots. Eggplant. Green beans. Chard. Kale. Spinach. Onions. Leeks. Peas. Squash. Tomatoes. Peppers. Radishes. Fruits: Apples. Apricots. Avocado. Berries. Bananas. Cherries. Dates. Figs. Grapes. Lemons. Melon. Oranges. Peaches. Plums. Pomegranate. Meats and other protein foods: Beans. Almonds. Sunflower seeds. Pine nuts. Peanuts. Cod. Salmon. Scallops. Shrimp. Tuna. Tilapia. Clams. Oysters. Eggs. Dairy: Low-fat milk. Cheese. Greek yogurt. Beverages: Water. Red wine. Herbal tea. Fats and oils: Extra virgin olive oil. Avocado oil. Grape seed oil. Sweets and desserts: Greek yogurt with honey. Baked apples. Poached pears. Trail mix. Seasoning and other foods: Basil. Cilantro. Coriander. Cumin. Mint. Parsley. Sage. Rosemary. Tarragon. Garlic. Oregano. Thyme. Pepper. Balsalmic vinegar. Tahini. Hummus. Tomato sauce. Olives. Mushrooms. Limit these Grains:  Prepackaged pasta or rice dishes. Prepackaged cereal with added sugar. Vegetables: Deep fried potatoes (french fries). Fruits: Fruit canned in syrup. Meats and other protein foods: Beef. Pork. Lamb. Poultry with skin. Hot dogs. [place]. Dairy: Ice cream. Sour cream. Whole milk. Beverages: Juice. Sugar-sweetened soft drinks. Beer. Liquor and spirits. Fats and oils: Butter. Canola oil. Vegetable oil. Beef fat (tallow). Lard. Sweets and desserts: Cookies. Cakes. Pies. Candy. Seasoning and other foods: Mayonnaise. Premade sauces and marinades. The items listed may not be a complete list. Talk with your dietitian about what dietary choices are right for you. Summary The Mediterranean diet includes both food and lifestyle choices. Eat a variety of fresh fruits and vegetables, beans, nuts, seeds, and whole grains. Limit the amount of red meat and sweets that you eat. Talk with your health care provider about whether it is safe for you to drink red wine in moderation. This means 1 glass a day for nonpregnant women and 2 glasses a day for men. A glass of wine equals 5 oz (150 mL). This information is not intended to replace advice given to you by your health care provider. Make sure you discuss any questions you have with your health care provider. Document Released: 07/14/2016 Document Revised: 08/16/2016 Document Reviewed: 07/14/2016 Elsevier Interactive Patient Education  2017 [employer].

## 2024-11-13 NOTE — Progress Notes (Signed)
 Mild cognitive impairment with memory loss and mood disturbance Alcohol abuse   Lindsey Rowland is a delightful 73 y.o. RH female with a history of NSCLCa s/p XRT R lung, COPD emphysema with recent exacerbation 10/24/2024, tobacco abuse, ETOH habituation, prior asbestos exposure, possible depression per daughter's report, arthritis, polio as a child and a diagnosis of mild cognitive impairment  per neuropsychological evaluation  presenting today in follow-up for evaluation of memory loss. Patient is on donepezil  10 mg daily, missing doses unless her niece monitors it. This patient is accompanied in the office by her niece who supplements the history. Previous records as well as any outside records available were reviewed prior to todays visit.   Patient was last seen on 04/24/2024 . Mild memory decline is noted, concern for progression to dementia,  with MMSE 23/30. Patient is able to participate on ADLs, no longer drives.  Mood is depressed, in the past we placed a referral to psychiatry, but patient did not proceed with the appointment.      Follow-up in 6 months Continue Donepezil  10 mg daily, side effects discussed Continue Depakote  125 mg at night, may increase to 125 mg twice daily, side effects discussed  Repeat neuropsych evaluation for diagnostic clarity and disease trajectory Recommend tobacco and alcohol cessation Recommend behavioral health referral possible depression, agitation, anxiety and to further investigate any other psychiatric abnormality Recommend 24/7 monitoring for safety Recommend good control of cardiovascular risk factors.    Continue to control mood as per PCP     Discussed the use of AI scribe software for clinical note transcription with the patient, who gave verbal consent to proceed.  History of Present Illness  Lindsey Rowland is a 73 year old female with mild cognitive impairment who presents for a follow-up on memory and mood concerns. She is accompanied  by her niece, who is her primary caregiver.  Her niece notes worsening short-term memory, with difficulty retaining new information, conversations, and names. She can recall long-term memories but struggles with recent events, such as forgetting recent phone calls or grocery shopping trips. Her niece has taken over daily tasks such as cooking, ensuring medication adherence, and managing household chores due to these memory issues.  She has been experiencing symptoms of depression, particularly since the passing of her husband. She requires encouragement to perform daily activities such as bathing and caring for her plants and pets. She becomes defensive and verbally aggressive when assistance is offered. Her niece is concerned about her ability to live independently and manage her daily life without support.  She is currently taking donepezil  10 mg daily for memory and Depakote  125 mg at night for mood, although her niece is unsure if she takes it twice daily as needed. She also takes mirtazapine  for sleep and Lyrica  for restless legs. Her niece manages her medications and ensures she takes them as prescribed.  Her appetite has decreased, and she does not prepare meals for herself. However, she eats well when meals are prepared for her. Her alcohol consumption has significantly reduced, and she smokes less due to colder weather. She has had recent hospitalizations for lung issues, including pneumonia, and her niece ensures she uses her oxygen  as needed.  Her sleep is reported as good, with 7-8 hours per night, although she may wake up during the night. No hallucinations, paranoia, seizures, vision problems, stroke symptoms, urinary incontinence, or bowel issues. Her niece is involved in her care and manages her finances, while her daughter,  who lives in [place]  supports her health care decisions.    Neuropsychological evaluation 10/13/2023 Briefly, results suggested severe impairment surrounding cognitive  flexibility and both encoding (i.e., learning) and delayed retrieval aspects of memory. A relative weakness was exhibited across processing speed while additional performance variability was exhibited across semantic fluency, visuospatial abilities, and recognition/consolidation aspects of memory. Regarding etiology, testing patterns do raise concern for an underlying neurodegenerative illness, namely Alzheimer's disease. Across memory testing, Ms. Schaber did not benefit from repeated exposure to novel information, exhibiting flat learning curves. After a brief delay, retention rates were 0% and 16% across list and figure tasks respectively. While her retention rate was 50% for a story task, this amounted to her recalling only two details, suggesting ongoing impairment. Finally, performances across yes/no recognition trials were variable but below expectation overall. Taken together, this suggests evidence for rapid forgetting and an evolving and already fairly significant storage impairment, both of which are the hallmark testing patterns for this illness. Further weakness/variability across semantic fluency and cognitive flexibility would follow typical disease progression. Intact confrontation naming is encouraging in this regard. However, concerns for this illness remain. Elevated alcohol use/abuse may exacerbate cognitive impairment and could put her at a greater risk for an accelerated decline in the future. Continued medical monitoring will be important moving forward.    Initial visit 09/2023  How long did patient have memory difficulties?  For about 6 months.  Patient reports some difficulty remembering new information, recent conversations, names. She has also been going to the supermarket to get the same products, forgetting she has already purchased them. LTM is good.  repeats oneself?  Endorsed. Disoriented when walking into a room?  Patient denies   Leaving objects in unusual places?  Denies.    Wandering behavior? denies   Any personality changes, or depression, anxiety? Gets upset easily, worried that hey are attacking her because of her memory , worse if she has not been drinking.   Hallucinations or paranoia? Denies.  Seizures? Denies.    Any sleep changes?  Sleeps well . Denies vivid dreams. She has RLS , takes Lyrica  with good response. sleepwalking   Sleep apnea? Denies.   Any hygiene concerns?  Denies.   Independent of bathing and dressing? Endorsed.  Does the patient need help with medications? Patient  is in charge   Who is in charge of the finances?  Patient is in charge     Any changes in appetite? Decreased  Patient have trouble swallowing?  Denies.   Does the patient cook? No.  Any headaches?  Denies.   Chronic pain? Denies.   Ambulates with difficulty? Denies, walks often.   Recent falls or head injuries? Denies.     Vision changes?  Denies any new issues.  Has a history of detached retina. S/p lens surgery.  Any strokelike symptoms? Denies.   Any tremors? Denies.   Any anosmia? Denies.   Any incontinence of urine? Denies.   Any bowel dysfunction? Denies. If diarrhea she takes lomotil       Patient lives with husband who is disabled. Other daughter lives next door   History of heavy alcohol intake? 2-3 large beers a day.   History of heavy tobacco use? Smokes about 2 cigs a day now, smoked more in the recent past (1 y ago) Family history of dementia?  Mother with dementia AD  Does patient drive? Yes, denies getting lost.  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 Did HHN as well PT   MRI of the brain 11/15/2023, personally reviewed, was normal in appearance, normal vasculature    Past Medical History:  Diagnosis Date   Actinic keratoses 01/14/2021   Allergic rhinitis 12/10/2008   Arthropathy of ankle and foot 06/10/2009   Chronic obstructive pulmonary disease 07/25/2007   Coronary artery  calcification 01/22/2018   Diverticulitis of colon (without mention of hemorrhage) 07/15/2008   Elevated alcohol use    Epiretinal membrane 05/08/2011   Gastroesophageal reflux disease 11/08/2021   Generalized anxiety disorder 10/16/2007   Headache    History of poliomyelitis    History of radiation therapy 09/10/2018-09/17/2018   SBRT 3 fx to right lung; Dr. Lynwood Nasuti   Hypothyroidism 06/09/2023   Intercostal pain 05/04/2021   Irritable bowel syndrome 03/09/2010   Lentigo 01/14/2021   Malignant neoplasm of unspecified part of unspecified bronchus or lung 06/09/2023   Mass of chest wall, right 05/04/2021   Mild cognitive impairment with concerns for Alzheimer's disease 10/13/2023   Mild protein-calorie malnutrition 06/09/2023   Non-small cell cancer of right lung 04/16/2018   Nuclear cataract 02/23/2012   Osteoarthritis 12/10/2008   Osteopenia    Restless legs syndrome 01/20/2017   Retinal detachment of right eye with retinal break 02/23/2012   Seborrheic keratoses 01/14/2021   Spasm of muscle 06/29/2010   Status post intraocular lens implant 02/23/2012   Tobacco use 06/29/2010   Ulcer    stomach hx     Past Surgical History:  Procedure Laterality Date   APPENDECTOMY     BREAST BIOPSY Right 2013   CATARACT EXTRACTION     right   DILATION AND CURETTAGE OF UTERUS     FOOT SURGERY Bilateral    hip si joint     injection/ablation   RETINAL DETACHMENT SURGERY  12/2010   right eye   TONSILLECTOMY     VIDEO BRONCHOSCOPY WITH ENDOBRONCHIAL NAVIGATION Right 08/01/2018   Procedure: VIDEO BRONCHOSCOPY WITH ENDOBRONCHIAL NAVIGATION Fiducial Placement;  Surgeon: Shelah Lamar RAMAN, MD;  Location: [facility] OR;  Service: Thoracic;  Laterality: Right;      Results DIAGNOSTIC Neurocognitive testing: Mild cognitive impairment with concerns for Alzheimer's disease.     Objective:     PHYSICAL EXAMINATION:    VITALS:   Vitals:   11/13/24 1135  BP: (!) 144/85  Pulse: 87  Resp: 20   SpO2: 95%  Weight: 84 lb (38.1 kg)    GEN:  The patient appears stated age and is in NAD. HEENT:  Normocephalic, atraumatic.   Neurological examination:  General: NAD, well-groomed, appears stated age. Orientation: The patient is alert. Oriented to person,  place, not to date.  Cranial nerves: There is good facial symmetry. tearful appearing.  The speech is fluent and clear. No aphasia or dysarthria. Fund of knowledge is appropriate. Recent memory impaired and remote memory is normal.  Attention and concentration are reduced.  Able to name objects and repeat phrases.  Hearing is intact to conversational tone.   Delayed recall 0/3 Sensation: Sensation is intact to light touch throughout Motor: Strength is at least antigravity x4. DTR's 2/4 in UE/LE      09/20/2023   10:00 AM  Montreal Cognitive Assessment   Visuospatial/ Executive (0/5) 4  Naming (0/3) 3  Attention: Read list of digits (0/2) 2  Attention: Read list of letters (0/1) 1  Attention: Serial 7 subtraction starting at 100 (0/3) 0  Language: Repeat phrase (0/2) 0  Language : Fluency (  0/1) 1  Abstraction (0/2) 0  Delayed Recall (0/5) 0  Orientation (0/6) 4  Total 15  Adjusted Score (based on education) 15       11/13/2024    5:00 PM  MMSE - Mini Mental State Exam  Orientation to time 2  Orientation to Place 5  Registration 3  Attention/ Calculation 4  Recall 0  Language- name 2 objects 2  Language- repeat 1  Language- follow 3 step command 3  Language- read & follow direction 1  Write a sentence 1  Copy design 1  Total score 23      Movement examination: Tone: There is normal tone in the UE/LE Abnormal movements:  no tremor.  No myoclonus.  No asterixis.   Coordination:  There is no decremation with RAM's. Normal finger to nose  Gait and Station: The patient has no difficulty arising out of a deep-seated chair without the use of the hands. The patient's stride length is good.  Gait is cautious and  narrow.  She has a chronic limp due to prior polio as a child  Thank you for allowing us  the opportunity to participate in the care of this nice patient. Please do not hesitate to contact us  for any questions or concerns.   Total time spent on today's visit was 45 minutes dedicated to this patient today, preparing to see patient, examining the patient, ordering tests and/or medications and counseling the patient, documenting clinical information in the EHR or other health record, independently interpreting results and communicating results to the patient/family, discussing treatment and goals, answering patient's questions and coordinating care.  Cc:  Berneta Elsie Sayre, MD  Camie Sevin 11/13/2024 5:30 PM

## 2024-12-03 ENCOUNTER — Telehealth: Payer: Self-pay | Admitting: Physician Assistant

## 2024-12-03 NOTE — Telephone Encounter (Signed)
 Spoke to patients daughter and patient is getting worse, she is aggressive biting her niece and throwing a cat/ not eating / not taking meds/ not bathing / extreme depression  after her husband died./ patient saw psyc Dr. Kapoor believes this is entirely dementia and not a psyc situation. Needs letter for [facility] care giver,  Patients daughter needs guidance on what steps to take next.Patient is driving her car and they are afraid of what will happen to her.

## 2024-12-03 NOTE — Telephone Encounter (Signed)
 Pt's daughter Maurice called in this afternoon. Maurice stated that she have been trying to reach someone to discuss Pt's issues that she has been experiencing and she has not gotten a responds yet. Maurice would like a call back as soon as possible, due to she feels that her Mom ( Pt) is getting worse. Thanks

## 2024-12-06 NOTE — Telephone Encounter (Signed)
 Spoke with her daughter, all her questions were answered to her satisfaction, we will reach out to Dr. Kapoor to help us  in the management of her psychiatric issues.

## 2024-12-09 NOTE — Progress Notes (Signed)
 " Psychiatric Follow Up  Patient Identification: Lindsey Rowland MRN:  [id number] Date of Evaluation:  12/11/2024 Referral Source: Neurology  Televisit via video: I connected with patient on 12/11/24 at  1:00 PM EDT by a video enabled telemedicine application and verified that I am speaking with the correct person using two identifiers  Location: Patient: Home Provider: Clinic   I discussed the limitations of evaluation and management by telemedicine and the availability of in person appointments. The patient expressed understanding and agreed to proceed.  I discussed the assessment and treatment plan with the patient. The patient was provided an opportunity to ask questions and all were answered. The patient agreed with the plan and demonstrated an understanding of the instructions.   The patient was advised to call back or seek an in-person evaluation if the symptoms worsen or if the condition fails to improve as anticipated.  Assessment:  Lindsey Rowland is a 74 y.o. female with a history of insomnia, MNCD, alcohol use disorder, reported depression, insomnia, reduced appetite who presents for follow-up appointment today.  Her cellular number is [phone], which will be used for future virtual appointments.  Since the last visit she has had some agitation/aggression episodes and arguments related to cleaning and cooking with her granddaughter.  Though she has been reporting good mood but there seems to be strong suspicion of depression due to the loss of her husband and worsening memory as she could not remember what she ate in the morning today.  She continues to minimize her concerns.  Her daughter and granddaughter have been taking care of her and they pose no safety concerns.  She has stopped drinking alcohol, continues to smoke 3 cigarettes a day.  Memory continues to deteriorate and she is being managed with donepezil .  She does not report any anxiety.  Sleep has been fragmented,  appetite has been poor despite being on Remeron , plan to slowly taper it off over the course of 2 weeks.  For mood, plan to start her on Prozac  10 mg daily.  Her daughters were inquiring about astaxanthin and ashwagandha for her mood/sleep.  Strongly discouraged them against using CBD Gummies as that would exacerbate dementia.  No other medication changes apart from shifting her Depakote  to nighttime.  Will follow-up with her in 4 weeks.   Risk Assessment: An assessment of suicide and violence risk factors was performed as part of this evaluation and is not  significantly changed from the last visit. While future psychiatric events cannot be accurately predicted, the patient does not  currently require acute inpatient psychiatric care and does not  currently meet Bright  involuntary commitment criteria. 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 Past medication trials: Donepezil , Depakote  Status of problem: Chronic Interventions: -- Patient is on donepezil  10 mg daily, encouraged compliance, managed by neurologist -- Continue Depakote  250 mg daily for mood, managed by her neurologist -- Discontinue Remeron  -- Start Prozac  10 mg daily -- Encouraged to stop drinking alcohol and quit smoking  # Anorexia Past medication trials: Mirtazapine  Status of problem: Chronic/current Interventions: -- Encouraged eating  # Insomnia Past medication trials: Mirtazapine  Status of problem: Current Interventions: -- Switch Depakote  to 250 mg nightly -- Encouraged sleep hygiene -- Encouraged to stop drinking alcohol and quit smoking  Return to care in 1 months  Patient was given contact information for behavioral  health clinic and was instructed to call 911 for emergencies.    Patient and plan of care will be discussed with the Attending MD ,Dr.  Carvin, who agrees with the above statement and plan.   Subjective:  Chief Complaint:  Chief Complaint  Patient presents with   Follow-up   Medication Refill    History of Present Illness:   Lindsey Rowland is a 74 y.o. female with a history of insomnia, MNCD, alcohol use disorder, reported depression, insomnia, reduced appetite who presents virtually to [facility] for follow-up of behavioral problems/memory issues.  Patient continues to be followed by neurology for mood stabilization and memory impairment with donepezil  and Depakote  that was increased to 125 mg twice daily. Today, she reported her mood as good .  She denied any active or passive SI/HI/AVH.  She reported that she has been sleeping better however her daughter stated that she sleeps later during the night at 1 to 2 AM and wakes up late around 10 AM.  She reported poor appetite, could not remember what she ate in the morning.  Encouraged her to eat more.  Her daughters reported that she has been having agitation during the day and frequent arguments with her granddaughter over food, cooking, cleaning which the patient was agreeable to.  Reported that she has stopped drinking alcohol however continues to smoke 3 cigarettes a day.  Reported that her granddaughter has been helping her with daily chores.  They continue to report poor appetite and stated that Remeron  has not been helpful for her.  They reported that they were recently recommended to increase the dose of Depakote  to twice daily 125 mg, encouraged her to switch to dose to 250 mg nightly instead of twice daily to avoid the risk of sedation.  They inquired about a sugar log, CBD Gummies, strongly discouraged him against using CBD Gummies as that would exacerbate her dementia.  Lindsey Rowland reported that I am content now .  Also discussed about starting Prozac  10 mg daily for mood/depression, patient was amenable to the plan.  For Remeron  encouraged her to  take 7.5 mg for 2 weeks and then discontinue.  Plan to have her back in the clinic in 4 weeks.   Past Psychiatric History:  Diagnoses: insomnia, MNCD, alcohol use disorder, reported depression, insomnia, reduced appetite  Medication trials: Depakote , donepezil , Remeron  Previous psychiatrist/therapist: None Hospitalizations: None Suicide attempts: None SIB: Not available Hx of violence towards others: None Current access to guns: Denies Hx of trauma/abuse: Denies  Substance Abuse History in the last 12 months:  No.  Past Medical History:  Past Medical History:  Diagnosis Date   Actinic keratoses 01/14/2021   Allergic rhinitis 12/10/2008   Arthropathy of ankle and foot 06/10/2009   Chronic obstructive pulmonary disease 07/25/2007   Coronary artery calcification 01/22/2018   Diverticulitis of colon (without mention of hemorrhage) 07/15/2008   Elevated alcohol use    Epiretinal membrane 05/08/2011   Gastroesophageal reflux disease 11/08/2021   Generalized anxiety disorder 10/16/2007   Headache    History of poliomyelitis    History of radiation therapy 09/10/2018-09/17/2018   SBRT 3 fx to right lung; Dr. Lynwood Nasuti   Hypothyroidism 06/09/2023   Intercostal pain 05/04/2021   Irritable bowel syndrome 03/09/2010   Lentigo 01/14/2021   Malignant neoplasm of unspecified part of unspecified bronchus or lung 06/09/2023   Mass of chest wall, right 05/04/2021   Mild cognitive impairment with concerns for Alzheimer's disease 10/13/2023  Mild protein-calorie malnutrition 06/09/2023   Non-small cell cancer of right lung 04/16/2018   Nuclear cataract 02/23/2012   Osteoarthritis 12/10/2008   Osteopenia    Restless legs syndrome 01/20/2017   Retinal detachment of right eye with retinal break 02/23/2012   Seborrheic keratoses 01/14/2021   Spasm of muscle 06/29/2010   Status post intraocular lens implant 02/23/2012   Tobacco use 06/29/2010   Ulcer    stomach hx    Past Surgical  History:  Procedure Laterality Date   APPENDECTOMY     BREAST BIOPSY Right 2013   CATARACT EXTRACTION     right   DILATION AND CURETTAGE OF UTERUS     FOOT SURGERY Bilateral    hip si joint     injection/ablation   RETINAL DETACHMENT SURGERY  12/2010   right eye   TONSILLECTOMY     VIDEO BRONCHOSCOPY WITH ENDOBRONCHIAL NAVIGATION Right 08/01/2018   Procedure: VIDEO BRONCHOSCOPY WITH ENDOBRONCHIAL NAVIGATION Fiducial Placement;  Surgeon: Shelah Lamar RAMAN, MD;  Location: [facility] OR;  Service: Thoracic;  Laterality: Right;    Family Psychiatric History: Alzheimer's Disease in mother/unable to describe the psychiatric symptoms  Family History:  Family History  Problem Relation Age of Onset   Dementia Mother    Hiatal hernia Father    Mental illness Maternal Aunt    Suicidality Maternal Aunt    Colon cancer Neg Hx    Esophageal cancer Neg Hx    Stomach cancer Neg Hx    Rectal cancer Neg Hx     Social History:   Academic/Vocational: None Social History   Socioeconomic History   Marital status: Married    Spouse name: Not on file   Number of children: 2   Years of education: 13   Highest education level: Some college, no degree  Occupational History   Occupation: Retired  Tobacco Use   Smoking status: Every Day    Current packs/day: 0.25    Average packs/day: 0.3 packs/day for 40.0 years (10.0 ttl pk-yrs)    Types: Cigarettes   Smokeless tobacco: Never   Tobacco comments:    currently smoking 2-3 cigarettes a day  Vaping Use   Vaping status: Never Used  Substance and Sexual Activity   Alcohol use: Yes    Alcohol/week: 14.0 - 21.0 standard drinks of alcohol    Types: 14 - 21 Cans of beer per week    Comment: 2-3 beers daily   Drug use: No   Sexual activity: Never  Other Topics Concern   Not on file  Social History Narrative   Right handed   Drinks caffeine prn   Lives with husband   [place]   retired   Social Drivers of Health   Tobacco Use: High Risk  (11/13/2024)   Patient History    Smoking Tobacco Use: Every Day    Smokeless Tobacco Use: Never    Passive Exposure: Not on file  Financial Resource Strain: Medium Risk (06/27/2024)   Overall Financial Resource Strain (CARDIA)    Difficulty of Paying Living Expenses: Somewhat hard  Food Insecurity: No Food Insecurity (10/25/2024)   Epic    Worried About Programme Researcher, Broadcasting/film/video in the Last Year: Never true    Ran Out of Food in the Last Year: Never true  Transportation Needs: No Transportation Needs (10/25/2024)   Epic    Lack of Transportation (Medical): No    Lack of Transportation (Non-Medical): No  Physical Activity: Inactive (09/25/2023)   Exercise Vital Sign  Days of Exercise per Week: 0 days    Minutes of Exercise per Session: 0 min  Stress: Stress Concern Present (06/27/2024)   [employer] of Occupational Health - Occupational Stress Questionnaire    Feeling of Stress: Very much  Social Connections: Unknown (09/25/2023)   Social Connection and Isolation Panel    Frequency of Communication with Friends and Family: More than three times a week    Frequency of Social Gatherings with Friends and Family: Once a week    Attends Religious Services: Not on file    Active Member of Clubs or Organizations: No    Attends Banker Meetings: Never    Marital Status: Married  Depression (PHQ2-9): Low Risk (10/29/2024)   Depression (PHQ2-9)    PHQ-2 Score: 0  Alcohol Screen: Medium Risk (06/27/2024)   Alcohol Screen    Last Alcohol Screening Score (AUDIT): 10  Housing: Low Risk (10/25/2024)   Epic    Unable to Pay for Housing in the Last Year: No    Number of Times Moved in the Last Year: 0    Homeless in the Last Year: No  Utilities: Not At Risk (06/27/2024)   Epic    Threatened with loss of utilities: No  Health Literacy: Adequate Health Literacy (09/25/2023)   B1300 Health Literacy    Frequency of need for help with medical instructions: Never    Additional  Social History: updated  Allergies:   Allergies  Allergen Reactions   Amoxicillin  Diarrhea and Itching    Current Medications: Current Outpatient Medications  Medication Sig Dispense Refill   FLUoxetine  (PROZAC ) 10 MG capsule Take 1 capsule (10 mg total) by mouth daily. 30 capsule 0   albuterol  (VENTOLIN  HFA) 108 (90 Base) MCG/ACT inhaler 1 PUFF EVERY 8 HOURS AS NEEDED 18 each 10   Aspirin-Salicylamide-Caffeine (BC HEADACHE POWDER PO) Take 1 packet by mouth daily as needed (for pain or headache).      celecoxib  (CELEBREX ) 200 MG capsule TAKE 1 CAPSULE BY MOUTH EVERY DAY AS NEEDED 90 capsule 1   diphenoxylate -atropine  (LOMOTIL ) 2.5-0.025 MG tablet TAKE 1 TABLET BY MOUTH 4 (FOUR) TIMES DAILY AS NEEDED FOR DIARRHEA OR LOOSE STOOLS. 120 tablet 2   divalproex  (DEPAKOTE ) 125 MG DR tablet Take 1 tablet (125 mg total) by mouth at bedtime. 60 tablet 11   donepezil  (ARICEPT ) 10 MG tablet Take 1 tablet (10 mg total) by mouth daily. 90 tablet 3   Fluticasone-Umeclidin-Vilant (TRELEGY ELLIPTA ) 200-62.5-25 MCG/ACT AEPB Inhale 1 puff into the lungs daily. 3 each 3   ipratropium-albuterol  (DUONEB) 0.5-2.5 (3) MG/3ML SOLN Take 3 mLs by nebulization every 6 (six) hours as needed. Diagnosis code:  J44.1, J18.9 360 mL 3   mometasone  (NASONEX ) 50 MCG/ACT nasal spray PLACE 2 SPRAYS INTO THE NOSE DAILY. (Patient taking differently: Place 2 sprays into the nose daily. prn) 51 each 4   Multiple Vitamin (MULITIVITAMIN WITH MINERALS) TABS Take 1 tablet by mouth daily.     nicotine  (NICODERM CQ  - DOSED IN MG/24 HOURS) 14 mg/24hr patch Place 1 patch (14 mg total) onto the skin daily. 28 patch 0   OXYGEN  Inhale 2 L/min into the lungs continuous as needed (shortness of breath).     Polyethyl Glycol-Propyl Glycol (SYSTANE OP) Place 1 drop into both eyes daily as needed (for dry eyes).     promethazine -dextromethorphan (PROMETHAZINE -DM) 6.25-15 MG/5ML syrup Take 5 mLs by mouth 4 (four) times daily as needed for cough. Do  not use and drive - May  make drowsy. 118 mL 0   Varenicline  Tartrate, Starter, (CHANTIX  STARTING MONTH PAK) 0.5 MG X 11 & 1 MG X 42 TBPK Take 1 tablet by mouth daily. 1 each 2   No current facility-administered medications for this visit.    ROS: Review of Systems  Constitutional:  Negative for activity change, appetite change, chills, diaphoresis and fatigue.  HENT:  Negative for congestion, dental problem, drooling, ear discharge and ear pain.   Eyes:  Negative for pain, discharge and itching.  Respiratory:  Negative for apnea, cough, choking and chest tightness.   Cardiovascular:  Negative for chest pain, palpitations and leg swelling.  Gastrointestinal:  Negative for abdominal distention, abdominal pain, constipation, diarrhea and nausea.  Endocrine: Negative for cold intolerance and heat intolerance.  Genitourinary:  Negative for difficulty urinating, dysuria, flank pain, frequency, hematuria and urgency.  Musculoskeletal:  Negative for arthralgias, back pain, gait problem, joint swelling, myalgias and neck pain.  Skin:  Negative for color change and pallor.  Allergic/Immunologic: Negative for environmental allergies and food allergies.  Neurological:  Negative for dizziness, seizures, syncope, facial asymmetry, speech difficulty, light-headedness, numbness and headaches.  Psychiatric/Behavioral:  Negative for agitation, behavioral problems, confusion, decreased concentration, dysphoric mood, hallucinations, self-injury, sleep disturbance and suicidal ideas. The patient is not nervous/anxious and is not hyperactive.     Objective:  Psychiatric Specialty Exam: There were no vitals taken for this visit.There is no height or weight on file to calculate BMI.  General Appearance: Casual  Eye Contact:  Good  Speech:  Normal Rate  Volume:  Normal  Mood:  Euthymic  Affect:  Congruent  Thought Content: Logical   Suicidal Thoughts:  No  Homicidal Thoughts:  No  Thought Process:  Linear   Orientation:  Full (Time, Place, and Person)    Memory: Immediate;   Good Recent;   Fair Remote;   Fair  Judgment:  Fair  Insight:  Fair  Concentration:  Concentration: Good and Attention Span: Good  Recall:  not formally assessed  Fund of Knowledge: Good  Language: Good  Psychomotor Activity:  Normal  Akathisia:  No  AIMS (if indicated): not done  Assets:  Communication Skills Desire for Improvement Financial Resources/Insurance Housing Leisure Time Resilience Social Support Transportation  ADL's:  Intact  Cognition: WNL  Sleep:  Good   PE: General: well-appearing; no acute distress  Pulm: no increased work of breathing on room air  Strength & Muscle Tone: within normal limits Neuro: no focal neurological deficits observed  Gait & Station: normal  Metabolic Disorder Labs: No results found for: HGBA1C, MPG No results found for: PROLACTIN Lab Results  Component Value Date   CHOL 163 03/31/2021   TRIG 46.0 03/31/2021   HDL 73.60 03/31/2021   CHOLHDL 2 03/31/2021   VLDL 9.2 03/31/2021   LDLCALC 80 03/31/2021   LDLCALC 104 (H) 01/22/2018   Lab Results  Component Value Date   TSH 0.46 05/15/2023    Therapeutic Level Labs: No results found for: LITHIUM No results found for: CBMZ No results found for: VALPROATE  Screenings:  GAD-7    Flowsheet Row Office Visit from 07/08/2020 in [facility] [place]  Total GAD-7 Score 0   Mini-Mental    Flowsheet Row Office Visit from 11/13/2024 in [facility]  Total Score (max 30 points ) 23   PHQ2-9    Flowsheet Row Office Visit from 10/29/2024 in [facility] [place] [facility] [employer] Patient Outreach from 10/25/2024 in [place]  [facility] POPULATION HEALTH DEPARTMENT Office Visit from 07/15/2024 in [facility] Patient Outreach Telephone from 06/27/2024 in  POPULATION HEALTH DEPARTMENT Office Visit from 06/17/2024  in [facility]  PHQ-2 Total Score 0 0 0 0 0   Flowsheet Row UC from 10/24/2024 in [facility]  C-SSRS RISK CATEGORY No Risk    Collaboration of Care: Collaboration of Care: Other Dr. Carvin  Patient/Guardian was advised Release of Information must be obtained prior to any record release in order to collaborate their care with an outside provider. Patient/Guardian was advised if they have not already done so to contact the registration department to sign all necessary forms in order for us  to release information regarding their care.   Consent: Patient/Guardian gives verbal consent for treatment and assignment of benefits for services provided during this visit. Patient/Guardian expressed understanding and agreed to proceed.   Pamla Pangle, MD 1/7/20262:12 PM "

## 2024-12-11 ENCOUNTER — Telehealth (HOSPITAL_COMMUNITY)

## 2024-12-11 DIAGNOSIS — G3184 Mild cognitive impairment, so stated: Secondary | ICD-10-CM

## 2024-12-11 DIAGNOSIS — F321 Major depressive disorder, single episode, moderate: Secondary | ICD-10-CM

## 2024-12-11 DIAGNOSIS — R630 Anorexia: Secondary | ICD-10-CM | POA: Diagnosis not present

## 2024-12-11 DIAGNOSIS — G4709 Other insomnia: Secondary | ICD-10-CM | POA: Diagnosis not present

## 2024-12-11 MED ORDER — FLUOXETINE HCL 10 MG PO CAPS: 10.0000 mg | ORAL_CAPSULE | Freq: Every day | ORAL | 0 refills | Status: DC

## 2024-12-12 NOTE — Addendum Note (Signed)
 Addended by: CARVIN CROCK on: 12/12/2024 09:20 AM   Modules accepted: Level of Service

## 2024-12-18 ENCOUNTER — Ambulatory Visit (HOSPITAL_COMMUNITY)
Admission: RE | Admit: 2024-12-18 | Discharge: 2024-12-18 | Disposition: A | Discharge status: Home / Self Care | Source: Ambulatory Visit | Attending: Radiation Oncology | Admitting: Radiation Oncology

## 2024-12-18 DIAGNOSIS — C3491 Malignant neoplasm of unspecified part of right bronchus or lung: Secondary | ICD-10-CM | POA: Insufficient documentation

## 2024-12-20 ENCOUNTER — Institutional Professional Consult (permissible substitution): Admitting: Psychology

## 2024-12-20 ENCOUNTER — Ambulatory Visit: Payer: Self-pay | Admitting: Psychology

## 2024-12-20 ENCOUNTER — Encounter: Payer: Self-pay | Admitting: Psychology

## 2024-12-20 DIAGNOSIS — F03A18 Unspecified dementia, mild, with other behavioral disturbance: Secondary | ICD-10-CM

## 2024-12-20 DIAGNOSIS — F03A0 Unspecified dementia, mild, without behavioral disturbance, psychotic disturbance, mood disturbance, and anxiety: Secondary | ICD-10-CM

## 2024-12-20 DIAGNOSIS — F04 Amnestic disorder due to known physiological condition: Secondary | ICD-10-CM | POA: Diagnosis not present

## 2024-12-20 DIAGNOSIS — R4189 Other symptoms and signs involving cognitive functions and awareness: Secondary | ICD-10-CM

## 2024-12-20 HISTORY — DX: Unspecified dementia, mild, without behavioral disturbance, psychotic disturbance, mood disturbance, and anxiety: F03.A0

## 2024-12-20 NOTE — Progress Notes (Signed)
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 Within Normal Limits and Outside Normal Limits are used when a more specific description of the test score cannot be determined. Descriptors refer to the current evaluation only.        Percentile - Normative Descriptor > 98 - Exceptionally High 91-97 - Well Above Average 75-90 - Above Average 25-74 - Average 9-24 - Below Average 2-8 - Well Below Average < 2 - Exceptionally Low        Validity: November 2024 Current  DESCRIPTOR        DCT: --- --- --- Within Normal Limits  RBANS EI: --- --- --- Within Normal Limits  WAIS-IV RDS: --- --- --- Within Normal Limits        Orientation:       Raw Score Raw Score Percentile   NAB Orientation, Form 1 27/29 29/29 --- ---        Cognitive Screening:       Raw Score Raw Score Percentile   SLUMS: 15/30 17/30 --- ---        RBANS, Form A: Standard Score/ Scaled Score Standard Score/ Scaled Score Percentile   Total Score 68 72 3 Well Below Average  Immediate Memory 53 57 <1 Exceptionally Low    List Learning 2 3 1  Exceptionally Low    Story Memory 3 3 1  Exceptionally Low  Visuospatial/Constructional 81 87 19 Below Average    Figure Copy 12 12 75 Above Average    Line Orientation 6/20 8/20 <2  Exceptionally Low  Language 88 90 25 Average    Picture Naming 10/10 10/10 51-75 Average    Semantic Fluency 5 6 9  Below Average  Attention 88 94 34 Average    Digit Span 10 10 50 Average    Coding 6 8 25  Average  Delayed Memory 64 60 <1 Exceptionally Low    List Recall 0/10 0/10 <2 Exceptionally Low    List Recognition 18/20 18/20 17-25 Below Average to  Average    Story Recall 3 2 <1 Exceptionally Low    Story Recognition 8/12 6/12 3-4 Well Below Average    Figure Recall 3 2 <1 Exceptionally Low    Figure Recognition 2/8 7/8 53-69 Average        Intellectual Functioning:       Standard Score Standard Score Percentile   Test of Premorbid Functioning: 86 85 16 Below Average        Attention/Executive Function:      Trail Making Test (TMT): Raw Score (T Score) Raw Score (T Score) Percentile     Part A 90 secs.,  0 errors (22) 51 secs.,  0 errors (39) 14 Below Average    Part B Discontinued Discontinued --- Impaired          Scaled Score Scaled Score Percentile   WAIS-IV Digit Span: --- 9 37 Average    Forward --- 8 25 Average    Backward --- 9 37  Average    Sequencing --- 10 50 Average        D-KEFS Verbal Fluency Test: Raw Score (Scaled Score) Raw Score (Scaled Score) Percentile     Letter Total Correct 41 (12) 29 (8) 25 Average    Category Total Correct 31 (9) 33 (10) 50 Average    Category Switching Total Correct 4 (1) 6 (3) 1 Exceptionally Low    Category Switching Accuracy 2 (1) 3 (2) <1 Exceptionally Low      Total Set Loss Errors 7 (4) 8 (3) 1 Exceptionally Low      Total Repetition Errors 5 (8) 8 (5) 5 Well Below Average        Language:      Verbal Fluency Test: Raw Score (T Score) Raw Score (T Score) Percentile     Phonemic Fluency (FAS) 41 (49) 29 (42) 21 Below Average    Animal Fluency 19 (52) 11 (31) 3 Well Below Average         NAB Language Module, Form 1: T Score T Score Percentile     Auditory Comprehension --- 42 21 Below Average    Naming 30/31  (58) 29/31 (47) 38 Average        Visuospatial/Visuoconstruction:       Raw Score Raw Score Percentile   Clock Drawing: 8/10 9/10 --- Within Normal Limits        Mood and Personality:       Raw Score Raw Score Percentile   Geriatric Depression Scale: 10 3 --- Within Normal Limits  Geriatric Anxiety Scale: 11 7 --- Minimal    Somatic 7 5 --- Minimal    Cognitive 2 1 --- Minimal    Affective 2 1 --- Minimal        Additional Questionnaires:       Raw Score Raw Score Percentile   PROMIS Sleep Disturbance Questionnaire: 19 16 --- None to Slight   Informed Consent and Coding/Compliance:   The current evaluation represents a clinical evaluation for the purposes previously outlined by the referral source and is in no way reflective of a forensic evaluation.   Ms. Vullo was provided with a verbal description of the nature and purpose of the present neuropsychological evaluation. Also reviewed were the foreseeable risks and/or discomforts and benefits of the procedure, limits of confidentiality, and mandatory reporting requirements of this provider. The patient was given the opportunity to ask questions and receive answers about the evaluation. Oral consent to participate was provided by the patient.   This evaluation was conducted by Arthea KYM Maryland, Ph.D., ABPP-CN, board certified clinical neuropsychologist. Ms. Pavone completed a clinical interview with Dr. Maryland, billed as one unit [phone], and 100 minutes of cognitive testing and scoring, billed as one unit [phone] and two additional units [id number]. Psychometrist Lonell Jude, B.S. assisted Dr. Maryland with test administration and scoring procedures. As a separate and discrete service, one unit [id number] and two units 96133 (165 minutes) were billed for Dr. Loralee time spent in interpretation and report writing.      "

## 2024-12-20 NOTE — Progress Notes (Signed)
" ° °  Psychometrician Note   Cognitive testing was administered to Lindsey Rowland by Lonell Jude, B.S. (psychometrist) under the supervision of Dr. Arthea KYM Maryland, Ph.D., ABPP, licensed psychologist on 12/20/2024. Lindsey Rowland did not appear overtly distressed by the testing session per behavioral observation or responses across self-report questionnaires. Rest breaks were offered.   The battery of tests administered was selected by Dr. Zachary C. Merz, Ph.D., ABPP with consideration to Lindsey Rowland's current level of functioning, the nature of her symptoms, emotional and behavioral responses during interview, level of literacy, observed level of motivation/effort, and the nature of the referral question. This battery was communicated to the psychometrist. Communication between Dr. Arthea KYM Maryland, Ph.D., ABPP and the psychometrist was ongoing throughout the evaluation and Dr. Arthea KYM Maryland, Ph.D., ABPP was immediately accessible at all times. Dr. Zachary C. Merz, Ph.D., ABPP provided supervision to the psychometrist on the date of this service to the extent necessary to assure the quality of all services provided.    Lindsey Rowland will return within approximately 1-2 weeks for an interactive feedback session with Dr. Maryland at which time her test performances, clinical impressions, and treatment recommendations will be reviewed in detail. Lindsey Rowland understands she can contact our office should she require our assistance before this time.  A total of 100 minutes of billable time were spent face-to-face with Lindsey Rowland by the psychometrist. This includes both test administration and scoring time. Billing for these services is reflected in the clinical report generated by Dr. Arthea KYM Maryland, Ph.D., ABPP  This note reflects time spent with the psychometrician and does not include test scores or any clinical interpretations made by Dr. Maryland. The full report will follow in a separate note. "

## 2024-12-25 NOTE — Progress Notes (Signed)
 "  Radiation Oncology         [phone] ________________________________  Name: Lindsey Rowland MRN: [id number]  Date: 12/26/2024  DOB: 09/17/1951  Follow-Up Visit Note  CC: Lindsey Rowland  Lindsey Rowland  No diagnosis found.  Diagnosis: Non-Small cell lung cancer, clinical stage I-II     Interval Since Last Radiation:  6 years 3 months 9 day   09/10/2018, 09/12/2018, 09/17/2018: Right lung / 54 Gy in 3 fractions (SBRT)    Narrative:  The patient returns today for routine follow-up and to review recent imaging. She was last seen in office on 09/17/24 for a follow up visit. Patient continued to follow up with their specialists to manage their chronic conditions. Current smoker.   In the interval since she was last seen, she was seen in the UC on 10/24/24 for shortness of breath, congestion, and a cough. Rapid flu and COVID are negative. Chest x-ray is slightly hazy consistent with COPD emphysema but no consolidation is seen (no specific sign of pneumonia). She was then treated with steroids and send home with recommendations to continue using her inhalers.  Most recent CT chest performed on 12/18/24 shows stable treatment changes at the right lung base. No evidence of local recurrence or metastatic disease indicated on scan.    No other significant oncologic interval history since the patient was last seen.     Allergies:  is allergic to amoxicillin .  Meds: Current Outpatient Medications  Medication Sig Dispense Refill   albuterol  (VENTOLIN  HFA) 108 (90 Base) MCG/ACT inhaler 1 PUFF EVERY 8 HOURS AS NEEDED 18 each 10   Aspirin-Salicylamide-Caffeine (BC HEADACHE POWDER PO) Take 1 packet by mouth daily as needed (for pain or headache).      celecoxib  (CELEBREX ) 200 MG capsule TAKE 1 CAPSULE BY MOUTH EVERY DAY AS NEEDED 90 capsule 1   diphenoxylate -atropine  (LOMOTIL ) 2.5-0.025 MG tablet TAKE 1 TABLET BY MOUTH 4 (FOUR) TIMES DAILY AS NEEDED FOR DIARRHEA OR LOOSE  STOOLS. 120 tablet 2   divalproex  (DEPAKOTE ) 125 MG DR tablet Take 1 tablet (125 mg total) by mouth at bedtime. 60 tablet 11   donepezil  (ARICEPT ) 10 MG tablet Take 1 tablet (10 mg total) by mouth daily. 90 tablet 3   FLUoxetine  (PROZAC ) 10 MG capsule Take 1 capsule (10 mg total) by mouth daily. 30 capsule 0   Fluticasone-Umeclidin-Vilant (TRELEGY ELLIPTA ) 200-62.5-25 MCG/ACT AEPB Inhale 1 puff into the lungs daily. 3 each 3   ipratropium-albuterol  (DUONEB) 0.5-2.5 (3) MG/3ML SOLN Take 3 mLs by nebulization every 6 (six) hours as needed. Diagnosis code:  J44.1, J18.9 360 mL 3   mometasone  (NASONEX ) 50 MCG/ACT nasal spray PLACE 2 SPRAYS INTO THE NOSE DAILY. (Patient taking differently: Place 2 sprays into the nose daily. prn) 51 each 4   Multiple Vitamin (MULITIVITAMIN WITH MINERALS) TABS Take 1 tablet by mouth daily.     nicotine  (NICODERM CQ  - DOSED IN MG/24 HOURS) 14 mg/24hr patch Place 1 patch (14 mg total) onto the skin daily. 28 patch 0   OXYGEN  Inhale 2 L/min into the lungs continuous as needed (shortness of breath).     Polyethyl Glycol-Propyl Glycol (SYSTANE OP) Place 1 drop into both eyes daily as needed (for dry eyes).     promethazine -dextromethorphan (PROMETHAZINE -DM) 6.25-15 MG/5ML syrup Take 5 mLs by mouth 4 (four) times daily as needed for cough. Do not use and drive - May make drowsy. 118 mL 0   Varenicline  Tartrate, Starter, (CHANTIX  STARTING  MONTH PAK) 0.5 MG X 11 & 1 MG X 42 TBPK Take 1 tablet by mouth daily. 1 each 2   No current facility-administered medications for this visit.    Physical Findings: The patient is in no acute distress. Patient is alert and oriented.  vitals were not taken for this visit. .  No significant changes. Lungs are clear to auscultation bilaterally. Heart has regular rate and rhythm. No palpable cervical, supraclavicular, or axillary adenopathy. Abdomen soft, non-tender, normal bowel sounds.   Lab Findings: Lab Results  Component Value Date    WBC 3.6 (L) 09/20/2023   HGB 14.3 09/20/2023   HCT 43.6 09/20/2023   MCV 106.5 (H) 09/20/2023   PLT 251.0 09/20/2023    Radiographic Findings: CT CHEST WO CONTRAST Result Date: 12/24/2024 CLINICAL DATA:  Non-small cell lung cancer. Assess treatment response. * Tracking Code: BO * EXAM: CT CHEST WITHOUT CONTRAST TECHNIQUE: Multidetector CT imaging of the chest was performed following the standard protocol without IV contrast. RADIATION DOSE REDUCTION: This exam was performed according to the departmental dose-optimization program which includes automated exposure control, adjustment of the mA and/or kV according to patient size and/or use of iterative reconstruction technique. COMPARISON:  Outside chest CT 08/31/2024. Additional chest CT 02/21/2024 and 08/23/2023. FINDINGS: Cardiovascular: Diffuse atherosclerosis of the aorta, great vessels and coronary arteries. The heart size is normal. There is no pericardial effusion. Mediastinum/Nodes: There are no enlarged mediastinal, hilar or axillary lymph nodes.Hilar assessment is limited by the lack of intravenous contrast, although the hilar contours appear unchanged. The thyroid  gland, trachea and esophagus demonstrate no significant findings. Lungs/Pleura: Stable chronic small right pleural effusion and pleural thickening. No pneumothorax. Severe centrilobular emphysema with scattered central airway thickening and subpleural reticulation. There are treatment changes at the right lung base with fiducial markers and surrounding fibrotic changes. No evidence of recurrent mass lesion or suspicious nodularity elsewhere. Upper abdomen: The visualized upper abdomen appears stable, without suspicious findings. Diffuse aortic and branch vessel atherosclerosis. Probable small left renal cysts for which no specific follow-up imaging is recommended. Musculoskeletal/Chest wall: Fracture of the right 8th, 9th and 10th ribs posterolaterally with incomplete healing, stable.  Thoracolumbar scoliosis IMPRESSION: 1. Stable treatment changes at the right lung base. No evidence of local recurrence or metastatic disease. 2. Stable chronic small right pleural effusion and pleural thickening. 3. Aortic Atherosclerosis (ICD10-I70.0) and Emphysema (ICD10-J43.9). Electronically Signed   By: Elsie Perone M.D.   On: 12/24/2024 16:02    Impression: Non-Small cell lung cancer, clinical stage I-II    The patient is recovering from the effects of radiation.  ***  Plan:  ***   *** minutes of total time was spent for this patient encounter, including preparation, face-to-face counseling with the patient and coordination of care, physical exam, and documentation of the encounter. ____________________________________  Lynwood CHARM Nasuti, PhD, [place]  This document serves as a record of services personally performed by Lynwood Nasuti, [place]. It was created on his behalf by Reymundo Cartwright, a trained medical scribe. The creation of this record is based on the scribe's personal observations and the provider's statements to them. This document has been checked and approved by the attending provider.  "

## 2024-12-25 NOTE — Progress Notes (Signed)
 " Psychiatric Follow Up  Patient Identification: Lindsey Rowland MRN:  [id number] Date of Evaluation:  01/08/2025 Referral Source: Neurology  Televisit via video: I connected with patient on 02/04//26 at  1:30 PM EDT by a video enabled telemedicine application and verified that I am speaking with the correct person using two identifiers  Location: Patient: Home Provider: Clinic   I discussed the limitations of evaluation and management by telemedicine and the availability of in person appointments. The patient expressed understanding and agreed to proceed.  I discussed the assessment and treatment plan with the patient. The patient was provided an opportunity to ask questions and all were answered. The patient agreed with the plan and demonstrated an understanding of the instructions.   The patient was advised to call back or seek an in-person evaluation if the symptoms worsen or if the condition fails to improve as anticipated.  Assessment:  Lindsey Rowland is a 74 y.o. female with a history of insomnia, MNCD, alcohol use disorder, reported depression, insomnia, reduced appetite who presents for follow-up appointment today.  Her cellular number is [phone], which will be used for future virtual appointments. Since the last visit she has been in fairly the same without any changes.  She continues to have agitation/aggression episodes, arguments with her daughter and granddaughter over things related to cleaning, cooking.  Suspicion for depression continues however she is denying feeling depressed or anxious, she has been compliant on Prozac  and was not amenable to increasing the dose currently.  She is not drinking alcohol, continues to smoke 2 to 3 cigarettes a day.  She has good support system, her daughter and granddaughter take care of her and they report no safety concerns.  Her memory has stayed the same since the previous visit.  She has remained compliant with the medications, has had  no side effects however no changes with her sleep and energy levels.  They inquired about over-the-counter lithium supplements, updated them about no interactions with her medications.  She has been taking donepezil  for memory, Depakote  for mood that is being managed by her neurologist.  Plan to continue Prozac  10 mg daily, discussed about the dose being ineffective for her however they were not amenable to currently increasing the dose.  Will have her back in the clinic in 6 to 8 weeks.   Risk Assessment: An assessment of suicide and violence risk factors was performed as part of this evaluation and is not  significantly changed from the last visit. While future psychiatric events cannot be accurately predicted, the patient does not  currently require acute inpatient psychiatric care and does not  currently meet Rosalie  involuntary commitment criteria. 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 Past medication trials: Donepezil , Depakote  Status of problem: Chronic Interventions: -- Patient is on donepezil  10 mg daily, encouraged compliance, managed by neurologist -- Continue Depakote  250 mg daily for mood, managed by her neurologist -- Discontinue Remeron  -- Continue Prozac  10 mg daily -- Encouraged to stop drinking alcohol and quit smoking  # Anorexia Past medication trials: Mirtazapine  Status of problem: Chronic/current Interventions: -- Encouraged eating  # Insomnia Past medication trials: Mirtazapine  Status of problem: Current Interventions: -- Continue Depakote  to 250 mg nightly -- Encouraged sleep hygiene -- Encouraged to stop drinking alcohol and quit smoking  Return to care in 1 months  Patient was given contact  information for behavioral health clinic and was instructed to call 911 for emergencies.    Patient and plan  of care will be discussed with the Attending MD ,Dr. Carvin, who agrees with the above statement and plan.   Subjective:  Chief Complaint:  Chief Complaint  Patient presents with   Follow-up   Medication Refill    History of Present Illness:   Lindsey Rowland is a 74 y.o. female with a history of insomnia, MNCD, alcohol use disorder, reported depression, insomnia, reduced appetite who presents virtually to [facility] for follow-up of behavioral problems/memory issues.   Patient continues to be followed by neurology for mood stabilization and memory impairment with donepezil  and Depakote  that was increased to 125 mg twice daily. Today, she presented to the appointment virtually with daughter.  She reported her mood has good, same .  She denied any active or passive SI/HI/AVH.  Reported that she has not seen any difference on the medications.  She denied feeling depressed or anxious.  Her daughter stated that her agitation is worse and her medications are not working.  She reported that she is not resting well at all .  Reported that she wanted to try lithium orthonate over-the-counter.  Reported that they have been taking mirtazapine  every other night and will discontinue from now all.  They report that she continues to have arguments over doing things.  She reported that she has been doing crossword's, watching television, playing with her dog, washing clothes, cleaning kitchen and cooking sometimes.  She denied using any substances, denied drinking alcohol, reported that she has been smoking 2 to 3 cigarettes a day.  Reported that despite being the Depakote  she has not been sleeping well, gets up at 1:30 AM or 4 AM at times however is taking daytime naps.  She reported compliance with the medications, denied any side effects.  We discussed about continuing Prozac  at the same dose, continuing Depakote  without any changes.  They denied any safety concerns.  Will have her  back in the clinic in 4 to 6 weeks.  Past Psychiatric History:  Diagnoses: insomnia, MNCD, alcohol use disorder, reported depression, insomnia, reduced appetite  Medication trials: Depakote , donepezil , Remeron  Previous psychiatrist/therapist: None Hospitalizations: None Suicide attempts: None SIB: Not available Hx of violence towards others: None Current access to guns: Denies Hx of trauma/abuse: Denies  Substance Abuse History in the last 12 months:  No.  Past Medical History:  Past Medical History:  Diagnosis Date   Actinic keratoses 01/14/2021   Allergic rhinitis 12/10/2008   Arthropathy of ankle and foot 06/10/2009   Chronic obstructive pulmonary disease 07/25/2007   Coronary artery calcification 01/22/2018   Diverticulitis of colon (without mention of hemorrhage) 07/15/2008   Elevated alcohol use    Epiretinal membrane 05/08/2011   Gastroesophageal reflux disease 11/08/2021   Generalized anxiety disorder 10/16/2007   Headache    History of poliomyelitis    History of radiation therapy 09/10/2018-09/17/2018   SBRT 3 fx to right lung; Dr. Lynwood Nasuti   Hypothyroidism 06/09/2023   Intercostal pain 05/04/2021   Irritable bowel syndrome 03/09/2010   Lentigo 01/14/2021   Malignant neoplasm of unspecified part of unspecified bronchus or lung 06/09/2023   Mass of chest wall, right 05/04/2021   Mild dementia 12/20/2024   Mild protein-calorie malnutrition 06/09/2023   Non-small cell cancer of right lung 04/16/2018   Nuclear cataract 02/23/2012   Osteoarthritis 12/10/2008   Osteopenia    Restless legs syndrome 01/20/2017  Retinal detachment of right eye with retinal break 02/23/2012   Seborrheic keratoses 01/14/2021   Spasm of muscle 06/29/2010   Status post intraocular lens implant 02/23/2012   Tobacco use 06/29/2010   Ulcer    stomach hx    Past Surgical History:  Procedure Laterality Date   APPENDECTOMY     BREAST BIOPSY Right 2013   CATARACT EXTRACTION      right   DILATION AND CURETTAGE OF UTERUS     FOOT SURGERY Bilateral    hip si joint     injection/ablation   RETINAL DETACHMENT SURGERY  12/2010   right eye   TONSILLECTOMY     VIDEO BRONCHOSCOPY WITH ENDOBRONCHIAL NAVIGATION Right 08/01/2018   Procedure: VIDEO BRONCHOSCOPY WITH ENDOBRONCHIAL NAVIGATION Fiducial Placement;  Surgeon: Shelah Lamar RAMAN, MD;  Location: [facility] OR;  Service: Thoracic;  Laterality: Right;    Family Psychiatric History: Alzheimer's Disease in mother/unable to describe the psychiatric symptoms  Family History:  Family History  Problem Relation Age of Onset   Dementia Mother    Hiatal hernia Father    Mental illness Maternal Aunt    Suicidality Maternal Aunt    Colon cancer Neg Hx    Esophageal cancer Neg Hx    Stomach cancer Neg Hx    Rectal cancer Neg Hx     Social History:   Academic/Vocational: None Social History   Socioeconomic History   Marital status: Widowed    Spouse name: Not on file   Number of children: 2   Years of education: 60   Highest education level: Some college, no degree  Occupational History   Occupation: Retired  Tobacco Use   Smoking status: Every Day    Current packs/day: 0.25    Average packs/day: 0.3 packs/day for 40.0 years (10.0 ttl pk-yrs)    Types: Cigarettes   Smokeless tobacco: Never   Tobacco comments:    currently smoking 2-3 cigarettes a day  Vaping Use   Vaping status: Never Used  Substance and Sexual Activity   Alcohol use: Yes    Comment: 12/20/24 - minimal alcohol use; previously 2-3 beers daily   Drug use: No   Sexual activity: Never  Other Topics Concern   Not on file  Social History Narrative   Right handed   Drinks caffeine prn   Lives with husband   [place]   retired   Social Drivers of Health   Tobacco Use: High Risk (12/20/2024)   Patient History    Smoking Tobacco Use: Every Day    Smokeless Tobacco Use: Never    Passive Exposure: Not on file  Financial Resource Strain: Medium Risk  (06/27/2024)   Overall Financial Resource Strain (CARDIA)    Difficulty of Paying Living Expenses: Somewhat hard  Food Insecurity: No Food Insecurity (10/25/2024)   Epic    Worried About Programme Researcher, Broadcasting/film/video in the Last Year: Never true    Ran Out of Food in the Last Year: Never true  Transportation Needs: No Transportation Needs (10/25/2024)   Epic    Lack of Transportation (Medical): No    Lack of Transportation (Non-Medical): No  Physical Activity: Inactive (09/25/2023)   Exercise Vital Sign    Days of Exercise per Week: 0 days    Minutes of Exercise per Session: 0 min  Stress: Stress Concern Present (06/27/2024)   [employer] of Occupational Health - Occupational Stress Questionnaire    Feeling of Stress: Very much  Social Connections: Unknown (09/25/2023)  Social Advertising Account Executive    Frequency of Communication with Friends and Family: More than three times a week    Frequency of Social Gatherings with Friends and Family: Once a week    Attends Religious Services: Not on file    Active Member of Clubs or Organizations: No    Attends Banker Meetings: Never    Marital Status: Married  Depression (PHQ2-9): Low Risk (10/29/2024)   Depression (PHQ2-9)    PHQ-2 Score: 0  Alcohol Screen: Medium Risk (06/27/2024)   Alcohol Screen    Last Alcohol Screening Score (AUDIT): 10  Housing: Low Risk (10/25/2024)   Epic    Unable to Pay for Housing in the Last Year: No    Number of Times Moved in the Last Year: 0    Homeless in the Last Year: No  Utilities: Not At Risk (06/27/2024)   Epic    Threatened with loss of utilities: No  Health Literacy: Adequate Health Literacy (09/25/2023)   B1300 Health Literacy    Frequency of need for help with medical instructions: Never    Additional Social History: updated  Allergies:   Allergies  Allergen Reactions   Amoxicillin  Diarrhea and Itching    Current Medications: Current Outpatient Medications   Medication Sig Dispense Refill   albuterol  (VENTOLIN  HFA) 108 (90 Base) MCG/ACT inhaler 1 PUFF EVERY 8 HOURS AS NEEDED 18 each 10   Aspirin-Salicylamide-Caffeine (BC HEADACHE POWDER PO) Take 1 packet by mouth daily as needed (for pain or headache).      celecoxib  (CELEBREX ) 200 MG capsule TAKE 1 CAPSULE BY MOUTH EVERY DAY AS NEEDED 90 capsule 1   diphenoxylate -atropine  (LOMOTIL ) 2.5-0.025 MG tablet TAKE 1 TABLET BY MOUTH 4 (FOUR) TIMES DAILY AS NEEDED FOR DIARRHEA OR LOOSE STOOLS. 120 tablet 2   divalproex  (DEPAKOTE ) 125 MG DR tablet Take 1 tablet (125 mg total) by mouth at bedtime. (Patient taking differently: Take 125 mg by mouth at bedtime. 2 caps qhs) 60 tablet 11   donepezil  (ARICEPT ) 10 MG tablet Take 1 tablet (10 mg total) by mouth daily. 90 tablet 3   FLUoxetine  (PROZAC ) 10 MG capsule TAKE 1 CAPSULE BY MOUTH EVERY DAY 90 capsule 1   Fluticasone-Umeclidin-Vilant (TRELEGY ELLIPTA ) 200-62.5-25 MCG/ACT AEPB Inhale 1 puff into the lungs daily. 3 each 3   ipratropium-albuterol  (DUONEB) 0.5-2.5 (3) MG/3ML SOLN Take 3 mLs by nebulization every 6 (six) hours as needed. Diagnosis code:  J44.1, J18.9 360 mL 3   mometasone  (NASONEX ) 50 MCG/ACT nasal spray PLACE 2 SPRAYS INTO THE NOSE DAILY. (Patient taking differently: Place 2 sprays into the nose daily. prn) 51 each 4   Multiple Vitamin (MULITIVITAMIN WITH MINERALS) TABS Take 1 tablet by mouth daily.     nicotine  (NICODERM CQ  - DOSED IN MG/24 HOURS) 14 mg/24hr patch Place 1 patch (14 mg total) onto the skin daily. 28 patch 0   OXYGEN  Inhale 2 L/min into the lungs continuous as needed (shortness of breath).     Polyethyl Glycol-Propyl Glycol (SYSTANE OP) Place 1 drop into both eyes daily as needed (for dry eyes).     promethazine -dextromethorphan (PROMETHAZINE -DM) 6.25-15 MG/5ML syrup Take 5 mLs by mouth 4 (four) times daily as needed for cough. Do not use and drive - May make drowsy. 118 mL 0   Varenicline  Tartrate, Starter, (CHANTIX  STARTING MONTH  PAK) 0.5 MG X 11 & 1 MG X 42 TBPK Take 1 tablet by mouth daily. 1 each 2   No current facility-administered  medications for this visit.    ROS: Review of Systems  Constitutional:  Negative for activity change, appetite change, chills, diaphoresis and fatigue.  HENT:  Negative for congestion, dental problem, drooling, ear discharge and ear pain.   Eyes:  Negative for pain, discharge and itching.  Respiratory:  Negative for apnea, cough, choking and chest tightness.   Cardiovascular:  Negative for chest pain, palpitations and leg swelling.  Gastrointestinal:  Negative for abdominal distention, abdominal pain, constipation, diarrhea and nausea.  Endocrine: Negative for cold intolerance and heat intolerance.  Genitourinary:  Negative for difficulty urinating, dysuria, flank pain, frequency, hematuria and urgency.  Musculoskeletal:  Negative for arthralgias, back pain, gait problem, joint swelling, myalgias and neck pain.  Skin:  Negative for color change and pallor.  Allergic/Immunologic: Negative for environmental allergies and food allergies.  Neurological:  Negative for dizziness, seizures, syncope, facial asymmetry, speech difficulty, light-headedness, numbness and headaches.  Psychiatric/Behavioral:  Negative for agitation, behavioral problems, confusion, decreased concentration, dysphoric mood, hallucinations, self-injury, sleep disturbance and suicidal ideas. The patient is not nervous/anxious and is not hyperactive.     Objective:  Psychiatric Specialty Exam: There were no vitals taken for this visit.There is no height or weight on file to calculate BMI.  General Appearance: Casual  Eye Contact:  Good  Speech:  Normal Rate  Volume:  Normal  Mood:  Euthymic  Affect:  Congruent  Thought Content: Logical   Suicidal Thoughts:  No  Homicidal Thoughts:  No  Thought Process:  Linear  Orientation:  Full (Time, Place, and Person)    Memory: Immediate;   Good Recent;   Fair Remote;    Fair  Judgment:  Fair  Insight:  Fair  Concentration:  Concentration: Good and Attention Span: Good  Recall:  not formally assessed  Fund of Knowledge: Good  Language: Good  Psychomotor Activity:  Normal  Akathisia:  No  AIMS (if indicated): not done  Assets:  Communication Skills Desire for Improvement Financial Resources/Insurance Housing Leisure Time Resilience Social Support Transportation  ADL's:  Intact  Cognition: WNL  Sleep:  Good   PE: General: well-appearing; no acute distress  Pulm: no increased work of breathing on room air  Strength & Muscle Tone: within normal limits Neuro: no focal neurological deficits observed  Gait & Station: normal  Metabolic Disorder Labs: No results found for: HGBA1C, MPG No results found for: PROLACTIN Lab Results  Component Value Date   CHOL 163 03/31/2021   TRIG 46.0 03/31/2021   HDL 73.60 03/31/2021   CHOLHDL 2 03/31/2021   VLDL 9.2 03/31/2021   LDLCALC 80 03/31/2021   LDLCALC 104 (H) 01/22/2018   Lab Results  Component Value Date   TSH 0.46 05/15/2023    Therapeutic Level Labs: No results found for: LITHIUM No results found for: CBMZ No results found for: VALPROATE  Screenings:  GAD-7    Flowsheet Row Office Visit from 07/08/2020 in [facility] [place]  Total GAD-7 Score 0   Mini-Mental    Flowsheet Row Office Visit from 11/13/2024 in [facility]  Total Score (max 30 points ) 23   PHQ2-9    Flowsheet Row Office Visit from 10/29/2024 in [facility] [employer] Patient Outreach from 10/25/2024 in [place] [facility] POPULATION HEALTH DEPARTMENT Office Visit from 07/15/2024 in [facility] Patient Outreach Telephone from 06/27/2024 in [place] POPULATION HEALTH DEPARTMENT Office Visit from 06[facility]17/2024 in [facility]  PHQ-2 Total  Score 0 0 0 0 0   Flowsheet Row  UC from 10/24/2024 in [facility]  C-SSRS RISK CATEGORY No Risk    Collaboration of Care: Collaboration of Care: Other Dr. Carvin  Patient/Guardian was advised Release of Information must be obtained prior to any record release in order to collaborate their care with an outside provider. Patient/Guardian was advised if they have not already done so to contact the registration department to sign all necessary forms in order for us  to release information regarding their care.   Consent: Patient/Guardian gives verbal consent for treatment and assignment of benefits for services provided during this visit. Patient/Guardian expressed understanding and agreed to proceed.   Joeline Freer, MD 2/4/20261:38 PM "

## 2024-12-26 ENCOUNTER — Ambulatory Visit
Admission: RE | Admit: 2024-12-26 | Discharge: 2024-12-26 | Disposition: A | Discharge status: Home / Self Care | Source: Ambulatory Visit | Attending: Radiation Oncology | Admitting: Radiation Oncology

## 2024-12-26 ENCOUNTER — Encounter: Payer: Self-pay | Admitting: Radiation Oncology

## 2024-12-26 VITALS — BP 117/59 | HR 65 | Temp 97.6°F | Resp 20 | Ht 66.0 in | Wt 81.4 lb

## 2024-12-26 DIAGNOSIS — C3490 Malignant neoplasm of unspecified part of unspecified bronchus or lung: Secondary | ICD-10-CM

## 2024-12-26 NOTE — Progress Notes (Signed)
 Lindsey Rowland is here today for follow up post radiation to the lung. Patient to also receive CT scan results.   Lung Side:  Right lung, patent completed treatment on 09/17/18.   Does the patient complain of any of the following: Pain: No  Shortness of breath w/wo exertion: Yes mostly on exertion.  Cough: No  Hemoptysis: No Pain with swallowing: No Swallowing/choking concerns: No Appetite: Fair.  Energy Level: Low  Post radiation skin Changes: No     Additional comments if applicable:  Patient continues to smoke. Daughter reports patient is having memory issues.   BP (!) 117/59 (BP Location: Left Arm, Patient Position: Sitting)   Pulse 65   Temp 97.6 F (36.4 C)   Resp 20   Ht 5' 6 (1.676 m)   Wt 81 lb 6.4 oz (36.9 kg)   SpO2 95%   BMI 13.14 kg/m

## 2024-12-27 ENCOUNTER — Ambulatory Visit: Admitting: Psychology

## 2024-12-27 DIAGNOSIS — F03A18 Unspecified dementia, mild, with other behavioral disturbance: Secondary | ICD-10-CM

## 2024-12-27 DIAGNOSIS — F04 Amnestic disorder due to known physiological condition: Secondary | ICD-10-CM | POA: Diagnosis not present

## 2024-12-27 NOTE — Progress Notes (Signed)
"  ° °  Neuropsychology Feedback Session Jolynn DEL. [facility] Department of Neurology  Reason for Referral:   Lindsey Rowland is a 74 y.o. right-handed Caucasian female referred by Camie Sevin, PA-C, to characterize her current cognitive functioning and assist with diagnostic clarity and treatment planning in the context of a previously diagnosed mild neurocognitive disorder and concerns for progressive cognitive decline.   Feedback:   Lindsey Rowland completed a comprehensive neuropsychological evaluation on 12/20/2024. Please refer to that encounter for the full report and recommendations. Briefly, results suggested significant impairment surrounding cognitive flexibility and both encoding (i.e., learning) and delayed retrieval aspects of memory. Further weakness/variability was exhibited across semantic fluency and recognition/consolidation aspects of memory. Outside of an isolated impairment across a line orientation task, other visuospatial abilities were appropriate. Performances were also appropriate relative to age-matched peers across processing speed, attention/concentration, receptive language, phonemic fluency, and confrontation naming. Relative to her previous evaluation in November 2024, Lindsey Rowland exhibited a large degree of stability. Severe memory impairment with at or near 0% retention after brief delays was exhibited across both evaluation time points. Presently, phonemic fluency exhibited a mild decline; however, overall performances were still normatively appropriate. Processing speed saw a very subtle improvement. While the cause for her mild dementia presentation retains some uncertainty, the presence of a neurodegenerative illness such as Alzheimer's disease remains plausible. Additional consideration could be given to a frontotemporal dementia presentation, as well as the more rare frontal variant of Alzheimer's disease. Ultimately, the attainment of biomarkers would be  necessary to more firmly suggest the presence of a neurodegenerative illness in some form as prior neuroimaging (i.e., a brain MRI in November 2024) was unremarkable per the reading neuroradiologist.   Lindsey Rowland was accompanied by her daughter during the current feedback session. Content of the current session focused on the results of her neuropsychological evaluation. Lindsey Rowland was given the opportunity to ask questions and her questions were answered. She was encouraged to reach out should additional questions arise. A copy of her report was provided at the conclusion of the visit.      One unit [id number] (35 minutes) was billed for Dr. Loralee time spent preparing for, conducting, and documenting the current feedback session with Lindsey Rowland. "

## 2025-01-02 ENCOUNTER — Other Ambulatory Visit (HOSPITAL_COMMUNITY): Payer: Self-pay

## 2025-01-02 DIAGNOSIS — F321 Major depressive disorder, single episode, moderate: Secondary | ICD-10-CM

## 2025-01-08 ENCOUNTER — Telehealth (HOSPITAL_COMMUNITY)

## 2025-01-08 ENCOUNTER — Telehealth: Payer: Self-pay | Admitting: *Deleted

## 2025-01-08 DIAGNOSIS — F321 Major depressive disorder, single episode, moderate: Secondary | ICD-10-CM

## 2025-01-08 DIAGNOSIS — F411 Generalized anxiety disorder: Secondary | ICD-10-CM

## 2025-01-08 MED ORDER — FLUOXETINE HCL 10 MG PO CAPS: 10.0000 mg | ORAL_CAPSULE | Freq: Every day | ORAL | 0 refills | Status: AC

## 2025-01-08 NOTE — Telephone Encounter (Signed)
 CALLED PATIENT TO INFORM OF CT FOR 06-25-25- ARRIVAL TIME- [facility] - 12:45 PM, NO RESTRICTIONS TO SCAN, PATIENT TO RECEIVE RESULTS FROM PA ELLIE MUIR ON 07/01/25 @ 11:30 AM, SPOKE WITH PATIENT'S DAUGHTER- CRYSTAL AND SHE IS AWARE OF THESE APPTS. AND THE INSTRUCTIONS

## 2025-01-09 NOTE — Addendum Note (Signed)
 Addended by: CARVIN CROCK on: 01/09/2025 08:19 AM   Modules accepted: Level of Service

## 2025-02-19 ENCOUNTER — Ambulatory Visit (HOSPITAL_COMMUNITY)

## 2025-06-04 ENCOUNTER — Ambulatory Visit: Admitting: Physician Assistant

## 2025-06-25 ENCOUNTER — Other Ambulatory Visit (HOSPITAL_COMMUNITY)

## 2025-07-01 ENCOUNTER — Ambulatory Visit: Admitting: Radiology
